# Patient Record
Sex: Female | Born: 1950
Health system: Southern US, Community
[De-identification: ages and names within clinical notes are randomized; demographics above are authoritative.]

## PROBLEM LIST (undated history)

## (undated) DIAGNOSIS — D509 Iron deficiency anemia, unspecified: Secondary | ICD-10-CM

## (undated) DIAGNOSIS — K219 Gastro-esophageal reflux disease without esophagitis: Secondary | ICD-10-CM

## (undated) DIAGNOSIS — M549 Dorsalgia, unspecified: Secondary | ICD-10-CM

## (undated) DIAGNOSIS — J449 Chronic obstructive pulmonary disease, unspecified: Secondary | ICD-10-CM

## (undated) DIAGNOSIS — I2510 Atherosclerotic heart disease of native coronary artery without angina pectoris: Secondary | ICD-10-CM

## (undated) DIAGNOSIS — J309 Allergic rhinitis, unspecified: Secondary | ICD-10-CM

## (undated) DIAGNOSIS — D649 Anemia, unspecified: Secondary | ICD-10-CM

## (undated) HISTORY — DX: Atherosclerotic heart disease of native coronary artery without angina pectoris: I25.10

## (undated) HISTORY — DX: Gastro-esophageal reflux disease without esophagitis: K21.9

## (undated) HISTORY — DX: Dorsalgia, unspecified: M54.9

## (undated) HISTORY — DX: Chronic obstructive pulmonary disease, unspecified: J44.9

## (undated) HISTORY — DX: Iron deficiency anemia, unspecified: D50.9

## (undated) HISTORY — DX: Allergic rhinitis, unspecified: J30.9

## (undated) HISTORY — DX: Anemia, unspecified: D64.9

---

## 2000-02-23 ENCOUNTER — Encounter: Admission: RE | Admit: 2000-02-23 | Discharge: 2000-02-23 | Payer: Self-pay | Admitting: Family Medicine

## 2000-02-23 ENCOUNTER — Encounter: Payer: Self-pay | Admitting: Family Medicine

## 2003-11-21 ENCOUNTER — Inpatient Hospital Stay (HOSPITAL_COMMUNITY): Admission: EM | Admit: 2003-11-21 | Discharge: 2003-11-26 | Payer: Self-pay | Admitting: Emergency Medicine

## 2006-07-19 ENCOUNTER — Encounter: Admission: RE | Admit: 2006-07-19 | Discharge: 2006-07-19 | Payer: Self-pay | Admitting: Otolaryngology

## 2007-11-04 ENCOUNTER — Inpatient Hospital Stay (HOSPITAL_COMMUNITY): Admission: EM | Admit: 2007-11-04 | Discharge: 2007-11-08 | Payer: Self-pay | Admitting: Emergency Medicine

## 2010-05-25 ENCOUNTER — Ambulatory Visit: Payer: Self-pay | Admitting: Diagnostic Radiology

## 2010-05-25 ENCOUNTER — Ambulatory Visit (HOSPITAL_BASED_OUTPATIENT_CLINIC_OR_DEPARTMENT_OTHER): Admission: RE | Admit: 2010-05-25 | Discharge: 2010-05-25 | Payer: Self-pay | Admitting: Family Medicine

## 2010-07-11 ENCOUNTER — Ambulatory Visit: Payer: Self-pay | Admitting: Radiology

## 2010-07-11 ENCOUNTER — Inpatient Hospital Stay (HOSPITAL_COMMUNITY): Admission: EM | Admit: 2010-07-11 | Discharge: 2010-07-14 | Payer: Self-pay | Admitting: Emergency Medicine

## 2010-07-11 ENCOUNTER — Encounter: Payer: Self-pay | Admitting: Emergency Medicine

## 2010-07-25 ENCOUNTER — Encounter: Payer: Self-pay | Admitting: Pulmonary Disease

## 2010-07-25 DIAGNOSIS — K219 Gastro-esophageal reflux disease without esophagitis: Secondary | ICD-10-CM

## 2010-07-25 DIAGNOSIS — M549 Dorsalgia, unspecified: Secondary | ICD-10-CM | POA: Insufficient documentation

## 2010-07-25 DIAGNOSIS — D509 Iron deficiency anemia, unspecified: Secondary | ICD-10-CM | POA: Insufficient documentation

## 2010-07-25 DIAGNOSIS — D649 Anemia, unspecified: Secondary | ICD-10-CM | POA: Insufficient documentation

## 2010-07-25 DIAGNOSIS — J309 Allergic rhinitis, unspecified: Secondary | ICD-10-CM | POA: Insufficient documentation

## 2010-07-25 DIAGNOSIS — J449 Chronic obstructive pulmonary disease, unspecified: Secondary | ICD-10-CM | POA: Insufficient documentation

## 2010-07-26 ENCOUNTER — Ambulatory Visit: Payer: Self-pay | Admitting: Pulmonary Disease

## 2010-07-26 ENCOUNTER — Encounter: Payer: Self-pay | Admitting: Pulmonary Disease

## 2010-07-26 DIAGNOSIS — F17200 Nicotine dependence, unspecified, uncomplicated: Secondary | ICD-10-CM

## 2010-07-26 DIAGNOSIS — J9610 Chronic respiratory failure, unspecified whether with hypoxia or hypercapnia: Secondary | ICD-10-CM | POA: Insufficient documentation

## 2010-09-13 ENCOUNTER — Ambulatory Visit
Admission: RE | Admit: 2010-09-13 | Discharge: 2010-09-13 | Payer: Self-pay | Source: Home / Self Care | Attending: Pulmonary Disease | Admitting: Pulmonary Disease

## 2010-09-20 NOTE — Assessment & Plan Note (Signed)
Summary: COPD-PT TO GO TO [facility] OFFICE//[facility]   Visit Type:  Initial Consult Copy to:  pcp Primary Provider/Referring Provider:  Dr. Marinda Elk  CC:  Pulmonary consult.  History of Present Illness: 59/F smoker for evaluation of COPD. Adm 11/22 - 24/11 for an exacerbation treated wih avelox & steroids, prednisone has caused 'psychosis' in the past. ABG on adm was 7.32/65/62 on O2 & 7.40/55/45 (RA) on discharge suggesting chronic resp failure, was put on O2. She has weaned herself off at rest. Low dose ativan started for anxiety. Spiriva was continued & symbicort given by Dr Foy Guadalajara which helps. She feels close to her baseline. CXrs reviewed, last nov 20, shows chronic changes of COPD & hyperinflation.  O2 satn as 92 % at rest but dropped to 87% after spirometry. This showed severe airway obstruction with FEV1 of 30% c/w Gold stg IV copd. I note repeated conversations with dr fried about smoking cessation. her last quit attempt was 5 yrs ago after  ahospitalisation that lasted x 3 months. Nicotine patches gave her vivid dreams, she does not want to try pills , is wiling to try hyponosis. She has concerns about not getting time off from work for [employer] rehab  Preventive Screening-Counseling & Management  Alcohol-Tobacco     Alcohol drinks/day: 0     Smoking Status: current     Packs/Day: 1.0     Year Started: 1968  Current Medications (verified): 1)  Nexium 40 Mg Cpdr (Esomeprazole Magnesium) .... Take 1 Tablet By Mouth Once A Day 2)  Spiriva Handihaler 18 Mcg Caps (Tiotropium Bromide Monohydrate) .Marland Kitchen.. 1 Capsule Once Daily 3)  Combivent 18-103 Mcg/act Aero (Ipratropium-Albuterol) .... 2 Puffs As Needed 4)  Nabumetone 500 Mg Tabs (Nabumetone) .... Take 1 Tablet By Mouth Two Times A Day 5)  Symbicort 160-4.5 Mcg/act Aero (Budesonide-Formoterol Fumarate) .... Inhale 2 Puffs Two Times A Day  Allergies (verified): 1)  ! Prednisone 2)  ! 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 (1 ppd) Packs/Day:  1.0 Alcohol drinks/day:  0  Review of Systems       The patient complains of shortness of breath with activity, acid heartburn, and indigestion.  The patient denies shortness of breath at rest, productive cough, non-productive cough, coughing up blood, chest pain, irregular heartbeats, loss of appetite, weight change, abdominal pain, difficulty swallowing, sore throat, tooth/dental problems, headaches, nasal congestion/difficulty breathing through nose, sneezing, itching, ear ache, anxiety, depression, hand/feet swelling, joint stiffness or pain, rash, change in color of mucus, and fever.    Vital Signs:  Patient profile:   60 year old female Height:      64 inches Weight:      173 pounds BMI:     29.80 O2 Sat:      92 % on Room air Temp:     98.5 degrees F oral Pulse rate:   82 /  minute BP sitting:   142 / 70  (left arm) Cuff size:   large  Vitals Entered By: Zackery Barefoot CMA (July 26, 2010 3:42 PM)  O2 Flow:  Room air CC: Pulmonary consult Comments Medications reviewed with patient Verified contact number and pharmacy with  patient Zackery Barefoot [facility]  July 26, 2010 3:43 PM    Physical Exam  Additional Exam:  wt 173 July 26, 2010  Gen. Pleasant, well-nourished, in no distress, normal affect ENT - no lesions, no post nasal drip Neck: No JVD, no thyromegaly, no carotid bruits Lungs: no use of accessory muscles, no dullness to percussion, decreased BL without rales or rhonchi  Cardiovascular: Rhythm regular, heart sounds  normal, no murmurs or gallops, no peripheral edema Abdomen: soft and non-tender, no hepatosplenomegaly, BS normal. Musculoskeletal: No deformities, no cyanosis or clubbing Neuro:  alert, non focal     Impression & Recommendations:  Problem # 1:  C O P D (ICD-496) Recent exacerbation ct symbicort, spiriva Pulm rehab, if she will enroll  Problem # 2:  CHRONIC RESPIRATORY FAILURE (ICD-518.83)  ct O2 on exertion & during sleep Compenated hypercarbia  Orders: Consultation Level IV ([address]109) Prescription Created Electronically [id number]) Rehabilitation Referral (Rehab)  Problem # 3:  TOBACCO ABUSE (ICD-305.1)  Long discussion about what to expect if she continues down this path. OK to try hypnosis or electronic cigarette may work better as part of pulm rehab program  Orders: Consultation Level IV [phone]) Prescription Created Electronically [phone])  Medications Added to Medication List This Visit: 1)  Spiriva Handihaler 18 Mcg Caps (Tiotropium bromide monohydrate) .Marland Kitchen.. 1 capsule once daily 2)  Combivent 18-103 Mcg/act Aero (Ipratropium-albuterol) .... 2 puffs as needed 3)  Symbicort 160-4.5 Mcg/act Aero (Budesonide-formoterol fumarate) .... Inhale 2 puffs two times a day  Patient Instructions: 1)  Copy sent to: Dr Foy Guadalajara 2)  Please schedule a follow-up appointment in 1 month. 3)  Use your oxygen when walking & during sleep 4)  breathing test shows severe airwya obstruction/ COPD 5)  Pulm rehab / exercise program 6)  stay on symbicort & spiriva 7)  We discussed smoking  cessation Prescriptions: COMBIVENT 18-103 MCG/ACT AERO (IPRATROPIUM-ALBUTEROL) 2 puffs as needed  #1 x 3   Entered and Authorized by:   Comer Locket Vassie Loll MD   Signed by:   Comer Locket Vassie Loll MD on 07/26/2010   Method used:   [facility]ically to        [id number]       [address]       [place], [place]  [address]       Ph: [phone] or [phone]       Fax: [phone]   RxID:   [id number] SPIRIVA HANDIHALER 18 MCG CAPS (TIOTROPIUM BROMIDE MONOHYDRATE) 1 capsule once daily  #1 x 5   Entered and Authorized by:   Comer Locket Vassie Loll MD   Signed by:   Comer Locket Vassie Loll MD on 07/26/2010   Method used:   Electronically to        [id number]       [address]       [place], [place]  [address]       Ph: [phone] or [phone]       Fax: [phone]   RxID:   [id number] SYMBICORT 160-4.5 MCG/ACT AERO (BUDESONIDE-FORMOTEROL FUMARATE)  Inhale 2 puffs two times a day  #1 x 5   Entered and Authorized by:   Comer Locket. Vassie Loll MD   Signed by:   Comer Locket Vassie Loll MD on 07/26/2010   Method used:   [facility]        [id number]       [address]       [place], [place]  [address]       Ph: [phone] or [phone]       Fax: [phone]   RxID:   [id number]    Immunization History:  Influenza Immunization History:    Influenza:  historical (06/21/2010)  Pneumovax Immunization History:    Pneumovax:  historical (07/04/2010)

## 2010-09-22 NOTE — Assessment & Plan Note (Signed)
Summary: 1 month follow up in [facility]//jwr   Visit Type:  Follow-up Copy to:  pcp Primary Provider/Referring Provider:  Dr. Marinda Elk  CC:  Pt states breathing is Samantha same. No new complaints.  History of Present Illness: 60/F smoker for FU of gold stg 4 COPD. Adm 11/22 - 24/11 for an exacerbation treated wih avelox & steroids, prednisone has caused 'psychosis' in Samantha past. ABG on adm was 7.32/65/62 on O2 & 7.40/55/45 (RA) on discharge suggesting chronic resp failure, was put on O2. She has weaned herself off at rest. Low dose ativan started for anxiety. Spiriva was continued & symbicort given by Dr Foy Guadalajara which helps. She feels close to her baseline. CXrs reviewed, last nov 20, shows chronic changes of COPD & hyperinflation.  O2 satn as 92 % at rest but dropped to 87% after spirometry. This showed severe airway obstruction with FEV1 of 30% c/w Gold stg IV copd. I note repeated conversations with dr fried about smoking cessation. her last quit attempt was 5 yrs ago after  ahospitalisation that lasted x 3 months. Nicotine patches gave her vivid dreams, she does not want to try pills , is wiling to try hypnosis. She has concerns about not getting time off from work for [employer] rehab  September 13, 2010 2:48 PM  Compliant with spiriva, uses O2 during sleep, not much during daytime - not sure if this is helping. Continues to smoke a PPD  Preventive Screening-Counseling & Management  Alcohol-Tobacco     Alcohol drinks/day: 0     Smoking Status: current     Packs/Day: 1.0     Year Started: 1968  Current Medications (verified): 1)  Nexium 40 Mg Cpdr (Esomeprazole Magnesium) .... Take 1 Tablet By Mouth Once A Day 2)  Nabumetone 500 Mg Tabs (Nabumetone) .... Take 1 Tablet By Mouth Two Times A Day 3)  Symbicort 160-4.5 Mcg/act Aero (Budesonide-Formoterol Fumarate) .... Inhale 2 Puffs Two Times A Day 4)  Spiriva Handihaler 18 Mcg Caps (Tiotropium Bromide Monohydrate) .... Once Daily 5)  Combivent 18-103  Mcg/act Aero (Ipratropium-Albuterol) .... As Needed  Allergies (verified): 1)  ! Prednisone 2)  ! 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 (1 ppd)  Review of Systems       Samantha Dyer complains of dyspnea on exertion.  Samantha Dyer denies anorexia, fever, weight loss, weight gain, vision loss, decreased hearing, hoarseness, chest pain, syncope, peripheral edema, prolonged cough, headaches, hemoptysis, abdominal pain, melena, hematochezia, severe indigestion/heartburn, hematuria, muscle weakness, suspicious skin lesions, transient blindness, difficulty walking, depression, unusual weight change, abnormal bleeding, enlarged lymph nodes, and angioedema.    Vital Signs:  Dyer profile:   60 year old female Height:      64 inches Weight:      169 pounds BMI:     29.11 O2 Sat:      91 % on Room air Temp:     98.6 degrees F oral Pulse rate:   90 / minute BP sitting:   142 / 70  (right arm) Cuff size:   regular  Vitals Entered By: Zackery Barefoot CMA (September 13, 2010 2:39 PM)  O2 Flow:  Room air CC: Pt states breathing is  Samantha same. No new complaints Comments Medications reviewed with Dyer Verified contact number and pharmacy with Dyer Zackery Barefoot [facility]  September 13, 2010 2:43 PM    Physical Exam  Additional Exam:  wt 173 July 26, 2010 169 September 14, 2010 Gen. Pleasant, well-nourished, in no distress, normal affect ENT - no lesions, no post nasal drip Neck: No JVD, no thyromegaly, no carotid bruits Lungs: no use of accessory muscles, no dullness to percussion,  decreased BL without rales or rhonchi  Cardiovascular: Rhythm regular, heart sounds  normal, no murmurs or gallops, no peripheral edema Musculoskeletal: No deformities, no cyanosis or clubbing      Impression & Recommendations:  Problem # 1:  C O P D (ICD-496) ct symbicort , spiriva discusse benefits of O2 Does not want to enroll in pulm rehab  Problem # 2:  TOBACCO ABUSE (ICD-305.1)  Discussed options & methods of quitting. She is willing to try chantix- discussed adverse effects Her updated medication list for this problem includes:    Chantix Starting Month Pak 0.5 Mg X 11 & 1 Mg X 42 Tabs (Varenicline tartrate) .Marland Kitchen... Take as directed    Chantix Continuing Month Pak 1 Mg Tabs (Varenicline tartrate) .Marland Kitchen... Take as directed  Orders: Est. Dyer Level III (16109) Prescription Created Electronically [phone])  Medications Added to Medication List This Visit: 1)  Symbicort 160-4.5 Mcg/act Aero (Budesonide-formoterol fumarate) .... Inhale 2 puffs two times a day 2)  Spiriva Handihaler 18 Mcg Caps (Tiotropium bromide monohydrate) .... Once daily 3)  Combivent 18-103 Mcg/act Aero (Ipratropium-albuterol) .... As needed 4)  Chantix Starting Month Pak 0.5 Mg X 11 & 1 Mg X 42 Tabs (Varenicline tartrate) .... Take as directed 5)  Chantix Continuing Month Pak 1 Mg Tabs (Varenicline tartrate) .... Take as directed  Dyer Instructions: 1)  Copy sent to: Dr Foy Guadalajara 2)  Please schedule a follow-up appointment in 4 months with TP 3)  Lets focus on quitting cigarettes - we talked about not smoking inside Samantha house 4)  Trial of chantix Prescriptions: CHANTIX CONTINUING MONTH PAK 1 MG TABS (VARENICLINE TARTRATE) take as directed  #1 x 2   Entered and Authorized by:   Comer Locket Vassie Loll MD   Signed by:   Comer Locket Vassie Loll MD on 09/13/2010   Method used:   [facility]        [id number]       [address]       [place], [place]  [address]       Ph: [phone] or  [phone]       Fax: [phone]   RxID:   [id number] CHANTIX STARTING MONTH PAK 0.5 MG X 11 & 1 MG X 42 TABS (VARENICLINE TARTRATE) take as directed  #10 x 0   Entered and Authorized by:   Comer Locket. Vassie Loll MD   Signed by:   Comer Locket Vassie Loll MD on 09/13/2010   Method used:   [facility] to        [id number]       [address]       [place], [place]  [address]       Ph: [phone] or [phone]       Fax: [phone]   RxID:   [id number]

## 2010-10-06 ENCOUNTER — Telehealth: Payer: Self-pay | Admitting: Pulmonary Disease

## 2010-10-12 NOTE — Progress Notes (Signed)
  Phone Note Call from Patient   Caller: [email or web address]  Call For: Starlene Consuegra Summary of Call: DAVE SAID PT WANTS HER 02 PICKED UP BUT THET WERE TOLD SHE IS TO USE IT HS AND SHE SAID SHE IS NOT USING IT ANYMORE WILL NEED ORDER TO DC Initial call taken by: Oneita Jolly,  October 06, 2010 12:47 PM  Follow-up for Phone Call        ok to dc. she is till smoking Follow-up by: Comer Locket. Vassie Loll MD,  October 06, 2010 1:42 PM

## 2010-11-01 LAB — BLOOD GAS, ARTERIAL
Acid-Base Excess: 6.5 mmol/L — ABNORMAL HIGH (ref 0.0–2.0)
Acid-Base Excess: 7.4 mmol/L — ABNORMAL HIGH (ref 0.0–2.0)
Bicarbonate: 33.6 mEq/L — ABNORMAL HIGH (ref 20.0–24.0)
Bicarbonate: 33.8 mEq/L — ABNORMAL HIGH (ref 20.0–24.0)
Delivery systems: POSITIVE
Drawn by: 24485
Drawn by: 29925
FIO2: 35 %
Inspiratory PAP: 14
O2 Saturation: 88.8 %
O2 Saturation: 94.9 %
Patient temperature: 98.6
Patient temperature: 98.6
TCO2: 35.2 mmol/L (ref 0–100)
TCO2: 35.5 mmol/L (ref 0–100)
TCO2: 35.8 mmol/L (ref 0–100)
pCO2 arterial: 55.5 mmHg — ABNORMAL HIGH (ref 35.0–45.0)
pCO2 arterial: 65.3 mmHg (ref 35.0–45.0)
pH, Arterial: 7.321 — ABNORMAL LOW (ref 7.350–7.400)
pH, Arterial: 7.402 — ABNORMAL HIGH (ref 7.350–7.400)
pO2, Arterial: 45.4 mmHg — ABNORMAL LOW (ref 80.0–100.0)
pO2, Arterial: 59.2 mmHg — ABNORMAL LOW (ref 80.0–100.0)
pO2, Arterial: 62.4 mmHg — ABNORMAL LOW (ref 80.0–100.0)

## 2010-11-01 LAB — POCT I-STAT 3, ART BLOOD GAS (G3+)
Patient temperature: 98.7
pCO2 arterial: 69.8 mmHg (ref 35.0–45.0)
pH, Arterial: 7.304 — ABNORMAL LOW (ref 7.350–7.400)

## 2010-11-01 LAB — BASIC METABOLIC PANEL
BUN: 1 mg/dL — ABNORMAL LOW (ref 6–23)
BUN: 4 mg/dL — ABNORMAL LOW (ref 6–23)
CO2: 30 mEq/L (ref 19–32)
Calcium: 8.8 mg/dL (ref 8.4–10.5)
Chloride: 96 mEq/L (ref 96–112)
Creatinine, Ser: 0.6 mg/dL (ref 0.4–1.2)
GFR calc Af Amer: >60 mL/min (ref >60)
GFR calc non Af Amer: >60 mL/min (ref >60)
Glucose, Bld: 103 mg/dL — ABNORMAL HIGH (ref 70–99)
Potassium: 4 mEq/L (ref 3.5–5.1)
Sodium: 135 mEq/L (ref 135–145)

## 2010-11-01 LAB — CBC
HCT: 43.3 % (ref 36.0–46.0)
Hemoglobin: 14.4 g/dL (ref 12.0–15.0)
MCH: 31.1 pg (ref 26.0–34.0)
MCHC: 33.3 g/dL (ref 30.0–36.0)
MCHC: 34.7 g/dL (ref 30.0–36.0)
MCV: 89.6 fL (ref 78.0–100.0)
MCV: 93.1 fL (ref 78.0–100.0)
Platelets: 169 10*3/uL (ref 150–400)
RBC: 4.89 MIL/uL (ref 3.87–5.11)
RDW: 14.2 % (ref 11.5–15.5)
RDW: 15.1 % (ref 11.5–15.5)

## 2010-11-01 LAB — MRSA PCR SCREENING: MRSA by PCR: POSITIVE — AB

## 2010-11-01 LAB — DIFFERENTIAL
Basophils Absolute: 0.2 10*3/uL — ABNORMAL HIGH (ref 0.0–0.1)
Basophils Relative: 2 % — ABNORMAL HIGH (ref 0–1)
Eosinophils Absolute: 0.1 10*3/uL (ref 0.0–0.7)
Eosinophils Relative: 1 % (ref 0–5)
Lymphs Abs: 0.9 10*3/uL (ref 0.7–4.0)
Neutrophils Relative %: 80 % — ABNORMAL HIGH (ref 43–77)

## 2010-11-01 LAB — POCT CARDIAC MARKERS: Myoglobin, poc: 94.7 ng/mL (ref 12–200)

## 2010-12-23 ENCOUNTER — Encounter: Payer: Self-pay | Admitting: Adult Health

## 2010-12-27 ENCOUNTER — Ambulatory Visit (INDEPENDENT_AMBULATORY_CARE_PROVIDER_SITE_OTHER): Payer: BC Managed Care – PPO | Admitting: Adult Health

## 2010-12-27 ENCOUNTER — Encounter: Payer: Self-pay | Admitting: Adult Health

## 2010-12-27 VITALS — BP 122/76 | HR 92 | Temp 98.7°F | Ht 64.0 in | Wt 171.5 lb

## 2010-12-27 DIAGNOSIS — J449 Chronic obstructive pulmonary disease, unspecified: Secondary | ICD-10-CM

## 2010-12-27 DIAGNOSIS — J9610 Chronic respiratory failure, unspecified whether with hypoxia or hypercapnia: Secondary | ICD-10-CM

## 2010-12-27 DIAGNOSIS — J4489 Other specified chronic obstructive pulmonary disease: Secondary | ICD-10-CM

## 2010-12-27 NOTE — Patient Instructions (Signed)
Restart Oxygen 2 l/m At bedtime  And with activiity/walking .  Continue on Spiriva and Symbicort  MOST IMPORTANT IS TO QUIT SMOKING. THIS IS THE BEST THING YOU CAN DO FOR YOURSELF follow up Dr. [name]  In 4 months and As needed    

## 2010-12-27 NOTE — Assessment & Plan Note (Signed)
Restart Oxygen 2 l/m At bedtime  And with activiity/walking .  Continue on Spiriva and Symbicort  MOST IMPORTANT IS TO QUIT SMOKING. THIS IS THE BEST THING YOU CAN DO FOR YOURSELF follow up Dr. [name]  In 4 months and As needed    

## 2010-12-27 NOTE — Assessment & Plan Note (Addendum)
Restart Oxygen 2 l/m At bedtime  And with activiity/walking .  Continue on Spiriva and Symbicort  MOST IMPORTANT IS TO QUIT SMOKING. THIS IS THE BEST THING YOU CAN DO FOR YOURSELF follow up Dr. Vassie Loll  In 4 months and As needed

## 2010-12-27 NOTE — Progress Notes (Signed)
Subjective:    Patient ID: Samantha Dyer, female    DOB: 1950/10/04, 60 y.o.   MRN: [id number]  HPI 60/F smoker for FU of gold stg 4 COPD.   Adm 11/22 - 24/11 for an exacerbation treated wih avelox & steroids, prednisone has caused 'psychosis' in the past. ABG on adm was 7.32/65/62 on O2 & 7.40/55/45 (RA) on discharge suggesting chronic resp failure, was put on O2. She has weaned herself off at rest. Low dose ativan started for anxiety. Spiriva was continued & symbicort given by Dr Foy Guadalajara which helps. She feels close to her baseline. CXrs reviewed, last nov 20, shows chronic changes of COPD & hyperinflation.  O2 satn as 92 % at rest but dropped to 87% after spirometry. This showed severe airway obstruction with FEV1 of 30% c/w Gold stg IV copd.  I note repeated conversations with dr fried about smoking cessation. her last quit attempt was 5 yrs ago after ahospitalisation that lasted x 3 months. Nicotine patches gave her vivid dreams, she does not want to try pills , is wiling to try hypnosis. She has concerns about not getting time off from work for [employer] rehab   September 13, 2010 2:48 PM  Compliant with spiriva, uses O2 during sleep, not much during daytime - not sure if this is helping.  Continues to smoke a PPD  12/27/10 Follow up  Pt returns for 4 month follow up. Since last visit, says she is doing okay. Still works fulltime, does Engineer, agricultural work. Is fairly sedentary-light housework, minimal shopping due to DOE. She did return her O2 back to DME. Felt she  Did not need. We discussed her desaturations. On arrival today she was 86% on RA with walking from lobby to exam room. Explained the benefit of o2 and dangers of hypoxia. She has agreed to restart O2 qhs and with actiivity. She would like portable lightweight device to use with shopping and walking.   She uses her inhalers consistently with rare use of combivent.  No ER visits or steroid use since last ov.   We discussed smoking  cesstation in detail . With several options of electronic cigs, patches, etc. She is not ready yet "   Review of Systems Constitutional:   No  weight loss, night sweats,  Fevers, chills, fatigue, or  lassitude.  HEENT:   No headaches,  Difficulty swallowing,  Tooth/dental problems, or  Sore throat,                No sneezing, itching, ear ache, nasal congestion, post nasal drip,   CV:  No chest pain,  Orthopnea, PND, swelling in lower extremities, anasarca, dizziness, palpitations, syncope.   GI  No heartburn, indigestion, abdominal pain, nausea, vomiting, diarrhea, change in bowel habits, loss of appetite, bloody stools.   Resp:   No coughing up of blood.  No change in color of mucus.  No wheezing.  No chest wall deformity  Skin: no rash or lesions.  GU: no dysuria, change in color of urine, no urgency or frequency.  No flank pain, no hematuria   MS:  No joint pain or swelling.  No decreased range of motion.  No back pain.  Psych:  No change in mood or affect. No depression or anxiety.  No memory loss.         Objective:   Physical Exam GEN: A/Ox3; pleasant , NAD, well nourished , overweight   HEENT:  Cliff/AT,  EACs-clear, TMs-wnl, NOSE-clear, THROAT-clear, no lesions,  no postnasal drip or exudate noted.   NECK:  Supple w/ fair ROM; no JVD; normal carotid impulses w/o bruits; no thyromegaly or nodules palpated; no lymphadenopathy.  RESP  Diminshed in bases , no accessory muscle use, no dullness to percussion  CARD:  RRR, no m/r/g  , no peripheral edema, pulses intact, no cyanosis or clubbing.  GI:   Soft & nt; nml bowel sounds; no organomegaly or masses detected.  Musco: Warm bil, no deformities or joint swelling noted.   Neuro: alert, no focal deficits noted.    Skin: Warm, no lesions or rashes         Assessment & Plan:

## 2011-01-03 NOTE — Discharge Summary (Signed)
Samantha Dyer, Samantha Dyer               ACCOUNT NO.:  [id number]   MEDICAL RECORD NO.:  [id number]          PATIENT TYPE:  INP   LOCATION:  1436                         FACILITY:  [facility]   PHYSICIAN:  Hollice Espy, M.D.DATE OF BIRTH:  August 29, 1950   DATE OF ADMISSION:  11/04/2007  DATE OF DISCHARGE:  11/08/2007                               DISCHARGE SUMMARY   PRIMARY CARE Aslan Montagna:  Dr. Dewaine Oats.   DISCHARGE DIAGNOSES:  1. Chronic obstructive pulmonary disease exacerbation.  2. Secondary bronchitis.  3. Tobacco abuse, ongoing.  4. Hypoxia requiring oxygen at the time of discharge.  5. Hypotension, now resolved.   DISCHARGE MEDICINES:  1. The patient resumed her previous medications of Meclizine 10 p.o.      daily p.r.n.  2. Nexium 40 p.o. p.r.n.   NEW MEDICATIONS:  1. Avelox 400 mg p.o. daily x4 days.  2. Prednisone taper 50 mg p.o. b.i.d. on day 1 40 mg p.o. b.i.d. on      day 2 and to continue until finished.  3. Combivent inhaler 2 puffs 3 times a day schedule x1 week then      p.r.n.  4. Home oxygen at 2L by nasal cannula continuous until she follows up      with her PCP next week.  5. The patient given a prescription for both Chantix and nicotine      patches with the Chantix at 0.5 mg p.o. daily x3 days then p.o.      b.i.d. x4 days then 1 mg p.o. b.i.d.  The patient is to resume 1      starter pack and 2 refill packs if her insurance will cover this.      If not, then she is being discharged on nicotine patches 21 mg      topically daily.   HOSPITAL COURSE:  The patient is a 60 year old white female with a past  medical history of ongoing tobacco abuse who presented with shortness of  breath, cough, and weakness.  When she presented, she was found to have  an elevated PCO2 level and required BiPAP, so she was placed in stepdown  unit.  She started on IV steroids, IV Avelox, and supplemental oxygen.  She did well.  The next day, the BiPAP was able to be weaned off.   X-  rays showed no signs of infiltrate, but with elevated white count and  productive cough, suspected she had bronchitis.  Given her long,  extensive tobacco history and presentation of lungs on x-ray, they felt  the patient had mild underlying COPD.  Over the next several days, the  patient's steroids were changed over to p.o. and she was able to be  weaned down.  She was continued on Avelox.  Her oxygenation improved and  she was able to remain on 90% on the day of discharge on room air.  However, with ambulation, her oxygen saturation dropped to 85%.  Given  the fact that she was breathing more comfortably and appeared to be  doing well, and the fact that her husband's brother had passed away on  the 19th and she needed to get home to attend to funeral arrangements,  we have set up home oxygen for the patient.  She will continue on home  oxygen for the next several days and then, once she follows up with her  PCP next week, if her oxygenation is improved, her home oxygen can be  discontinued.  At this time, she will continue it on a tapering  prednisone and a few more days of Avelox for a 7-day treatment.  These  will likely be finished by the time she sees her PCP.  She has expressed  an interest in quitting smoking.  I have given her  prescriptions for both nicotine patch and Chantix to be used, which ever  one can be covered by her insurance.  The patient's overall disposition  is improved.  Activity will be slow to increase.  Continuous home  oxygen.  Diet will be a regular diet, and she is being discharged to  home.  Follow up with PCP, Dr. Foy Guadalajara, next week.      Hollice Espy, M.D.  Electronically Signed     SKK/MEDQ  D:  11/08/2007  T:  11/08/2007  Job:  [id number]   cc:   Molly Maduro L. Foy Guadalajara, M.D.  Fax: [phone]

## 2011-01-03 NOTE — H&P (Signed)
Samantha Dyer, Samantha Dyer               ACCOUNT NO.:  [id number]   MEDICAL RECORD NO.:  [id number]          PATIENT TYPE:  INP   LOCATION:  0102                         FACILITY:  [facility]   PHYSICIAN:  Corinna L. Lendell Caprice, MDDATE OF BIRTH:  1951/05/23   DATE OF ADMISSION:  11/04/2007  DATE OF DISCHARGE:                              HISTORY & PHYSICAL   CHIEF COMPLAINT:  Cough.   HISTORY OF PRESENT ILLNESS:  Samantha Dyer is a 60 year old white female  patient of Dr. Nicholos Johns who was sent to the emergency, room when she was  found to be hypoxic in his office.  Apparently on arrival, her oxygen  saturations were in the 50s and on 3 liters of nasal cannula oxygen  increased 88%.  She was started on a Z-Pak on 11th of this month for  bronchitis, but she has been getting worse.  She has been sleepy,  vomiting, weak.  She has a cough productive of green sputum.  She is  sleepy and really only answers yes or no to questions.   PAST MEDICAL HISTORY:  COPD per records, continued tobacco abuse.   MEDICATIONS:  Nexium, meclizine as needed.   ALLERGIES  She reports a psychotic reaction to prednisone.  She has received Solu-  Medrol here, without any reaction in the emergency room.   SOCIAL HISTORY:  The patient is a Diplomatic Services operational officer.  She smokes a pack of  cigarettes a day.  She denies drinking or drug use.   FAMILY HISTORY:  Her mother died of breast cancer.  Her father died of  pancreatic cancer.   REVIEW OF SYSTEMS:  Is difficult, as the patient is somewhat sleepy but  as above otherwise negative.   PHYSICAL EXAMINATION:  VITAL SIGNS:  Temperature is 97 degrees, blood  pressure wonder 103/71, pulse 103, respiratory rate 24, oxygen  saturation 89% to 90% on 6 liters nasal cannula oxygen.  GENERAL:  The patient is a sleepy white female who is arousable.  HEENT:  Normocephalic, atraumatic.  Pupils equal, round, reactive to  light.  Sclerae nonicteric.  Moist mucous membranes.  She has a whitish  exudate  on the right tonsil.  There is no erythema or swelling.  NECK:  Supple.  No lymphadenopathy.  LUNGS:  She has diffuse rhonchi and diminished breath sounds throughout.  CARDIOVASCULAR:  Regular rate and rhythm without murmurs, gallops or  rubs.  ABDOMEN:  Obese, soft, nontender, nondistended.  GU AND RECTAL:  Deferred.  EXTREMITIES:  No clubbing, cyanosis or edema.  SKIN:  No rash.  PSYCHIATRIC.  The patient is cooperative.  NEUROLOGIC:  She is sleepy but oriented.  Cranial nerves and sensory  motor exam are intact.   Chest x-ray:  Preliminary reading shows possible pneumonia.   LABORATORY:  White blood cell count is 22,000 with 93% neutrophils, 5%  lymphocytes.  The rest of her CBC is unremarkable.  ABG on 100% non-  rebreather showed a pH of 7.325, pCO2 of 61.2, pO2 of 94, bicarbonate  31, base excess 3.5, oxygen saturation 96.7%.  Sodium 130, potassium  4.0, chloride 88, bicarbonate 34, glucose  130, BUN 20, creatinine 0.55.  Flu swab negative.   ASSESSMENT/PLAN:  1. Pneumonia with chronic obstructive pulmonary disease exacerbation.      The patient will be placed in the step-down unit, due to her      hypoxia and hypercarbia.  I will get another ABG in the morning.      She will get Avelox, nebulizers, and Solu-Medrol.  She will need      tobacco cessation counseling.  I will also get a urine Legionella      antigen.  She does have a single whitish exudate on the right      adenoid, and I will order a rapid strep swab, although she denies      sore throat.  She will get ulcer and DVT prophylaxis.  I will      monitor CBG as well on Solu-Medrol, monitor closely for altered      mental status on steroids.  2. Chronic obstructive pulmonary disease exacerbation.  3. Continued tobacco abuse.  4. Hyponatremia.      Corinna L. Lendell Caprice, MD  Electronically Signed     CLS/MEDQ  D:  11/04/2007  T:  11/04/2007  Job:  [id number]   cc:   Molly Maduro L. Foy Guadalajara, M.D.  Fax: [phone]

## 2011-01-06 NOTE — Discharge Summary (Signed)
Samantha Dyer, Samantha Dyer                         ACCOUNT NO.:  [id number]   MEDICAL RECORD NO.:  [id number]                   PATIENT TYPE:  INP   LOCATION:  9811                                 FACILITY:  [facility]   PHYSICIAN:  Hollice Espy, M.D.            DATE OF BIRTH:  1951/05/16   DATE OF ADMISSION:  11/21/2003  DATE OF DISCHARGE:                                 DISCHARGE SUMMARY   DATE OF BIRTH:  Jun 05, 1951.   PRIMARY CARE PHYSICIAN:  Robert L. Foy Guadalajara, M.D.   DISCHARGE DIAGNOSES:  1. Chronic obstructive pulmonary disease exacerbation.  2. Bronchopneumonia.  3. Tobacco abuse.   DISCHARGE MEDICATIONS:  1. Prednisone taper starting at 60 mg p.o. b.i.d. times two days, then     decreasing by 10 mg every two days until finished.  2. Albuterol inhaler two puffs four times a day times three days and then     p.r.n.  3. Avelox 400 mg p.o. daily.   HISTORY OF PRESENT ILLNESS:  This is a 60 year old white female with a  history of tobacco abuse who presented with cold like symptoms including a  hacking cough, sinus drainage and increasing shortness of breath three days  prior to coming in.  She had been tried empirically on Keflex and Tessalon  Perles, but this had not seemed to be getting much better.  When she came to  the ER, she was found to have an 02 saturation of 70% on room air.  An  initial chest x-ray was negative, but with hydration a follow-up chest x-ray  showed early signs of streaky consistent with bronchopneumonia.  After being  on six liters of 02, her 02 saturations went up to about 94%.  The patient  was noted to have an active smoking history for 35 pack years.  She was  started on IV steroids, antibiotics and nebulizers plus 02 and transferred.  In addition, she was also noted to have an ABG done which showed an elevated  pC02 level of 64.  The patient was started on BiPAP.  Given her BiPAP  control, she was placed initially in the step-down unit.   HOSPITAL COURSE:  1. In regard to the patient's COPD and pneumonia, the patient improved     nicely on BiPAP as well as the other supplemental treatments.  She felt     less short of breath.  We decided to stop the BiPAP and initially her     pC02 came down slightly from 64 down to the 50's, but then went back up     after being off of BiPAP.  Given the patient's continued C02 retention,     pulmonary critical care was consulted who felt that the patient had     hypoxic, hypocarbic respiratory failure with again a question of     community acquired pneumonia in the setting of tobacco abuse.  They  agreed with continuing the antibiotics and steroids and CPAP with the     lower rate increased to 8.0. She did not have to be ventilated and she     did actually improve greatly.  The patient was responding to antibiotics     and treatment without further increase in her pC02 level and by November 25, 2003 she was able to be transferred up to the floor. At that time her     white count had decreased down to 12.2 and her chest x-ray shows signs of     resolving pneumonia.  She was breathing comfortably and her respiratory     rate was normal.  By November 25, 2003 the patient was able to maintain an     elevated 02 saturation of 94% on room air when at rest and her IV     antibiotics were changed over to p.o. as well as her IV steroids.  By the     day of discharge, November 26, 2003, the patient was able to ambulate around     her room easily without oxygen and maintaining an 02 saturation of     greater than 94%.  The rest of her vital signs, blood pressure and     otherwise remained stable.  2. In addition, the patient was noted to have a low iron level at 14.0 and a     TIBC at 366.  A ferritin level was at the low end of normal at 20.  The     reason these laboratories were checked was that the patient was noted to     have an MCV of only 77.0.  This likely may represent, given the normal      ferritin level, of chronic disease but we will let Dr. Foy Guadalajara follow this     up.  3. The patient was counseled on tobacco abuse on the day of discharge.  She     says that she has just started a patch and is considering Wellbutrin.  I     informed her that both of these medications will provide temporary     relief, but will not aid in tobacco abuse unless the patient is seriously     interested in quitting smoking and hopefully that this will be a good     warning sign for that.  The patient is advised of this and that she is     willing to think about it.  She will continue to receive four days of     antibiotics for her remaining pneumonia.  She will continue to be on a     rapid steroid taper starting at 60 mg p.o. daily times two days and then     decreasing by 10 mg every two days until finished.  She will continue to     receive an Albuterol inhaler which she has had in the past, but she has     not used it in a long time. She will take this over the next three days,     two puffs four times a day and then as needed.   ACTIVITY:  She was advised not to return to work until seen by Dr. Foy Guadalajara  next week and then she may be cleared for activity after that; otherwise, no  exertional activity.   DISPOSITION:  Improved.  She is being discharged to home.   DIET:  She is discharged on a  regular diet.                                               Hollice Espy, M.D.    SKK/MEDQ  D:  11/26/2003  T:  11/27/2003  Job:  [id number]   cc:   Molly Maduro L. Foy Guadalajara, M.D.  [address] [place]  [place] [address]  Fax: [phone]

## 2011-01-06 NOTE — H&P (Signed)
NAME:  Samantha Dyer, Samantha Dyer                         ACCOUNT NO.:  [id number]   MEDICAL RECORD NO.:  [id number]                   PATIENT TYPE:  EMS   LOCATION:  ED                                   FACILITY:  [facility]   PHYSICIAN:  Isla Pence, M.D.             DATE OF BIRTH:  [birth year]   DATE OF ADMISSION:  11/21/2003  DATE OF DISCHARGE:                                HISTORY & PHYSICAL   ID STATEMENT:  This is a 60 year old Caucasian lady with no significant past  medical history except that she has been told she might have emphysema whose  primary care physician is Dr. Foy Guadalajara.   CHIEF COMPLAINT:  Worsening cold   HISTORY OF PRESENT ILLNESS:  This 60 year old lady says that she came down  with cold like symptoms starting on Wednesday which she describes as sinus  drainage and a hacking cough. She denies any fever, denies any sore throat  but notes a headache with it. She had been taking Motrin cold and sinus but  had not taken any Robitussin. She was seen by a local doctor who is close to  her since Dr. [facility] did not have any openings for her.  Dr. Quintella Reichert  had seen her the very next day on Thursday, given her Keflex 500 mg p.o.  t.i.d. that she had taken up until, I believe, this morning and also  Tessalon Perles. He had given her 200 mg p.o. t.i.d. of the [employer].  In addition, he sent her home with Nasonex nasal spray and maybe an Atrovent  nasal spray, she was not very clear on the other name. She felt that the  medicine was drying her up too much and she noted that she could not breath  through her nose. She tried some inhalers from a previous infectious process  last year. She tried two puffs of the albuterol inhaler and then repeated it  again two hours later without much significant improvement. She continued to  complain of shortness of breath and a hacking cough. She was seen at the  [facility] today and found her O2 saturations were low and I  was  told by the ER physician 70% sats on room air.  In addition, the patient  tells me that she had some blood work there and had a chest x-ray done prior  to being sent over to the [facility] Emergency Room.  In the emergency  room, her O2 saturations were noted to be 54% on room air and she was thus  placed on 6 liters of O2 which brought her saturations up to 94%.  The ER  physician based on that information felt the patient needed to be admitted  and subsequently called the [facility] doctor to admit her prior to any  of the labs being available.  Aside from the above history, the patient  notes that last year  Dr. Foy Guadalajara had advised her to go through some additional  pulmonary workup; however, the patient says that she has not gotten around  to doing this.  Dr. Foy Guadalajara had told her that she might have some signs of  emphysema starting up on her.  Of significance also is the fact that she has  continued to smoke a pack a day since the age of 57.  She denies any other  symptoms. She does note some allergies over the past few years that have  been acting up on her.  In the emergency room, she was given Solu-Medrol 125  mg IV x1 started on normal saline bolus about 500 mL and given albuterol,  Atrovent nebulizer treatment x1.  She was also given azithromycin.   ALLERGIES:  No known drug allergies.   CURRENT MEDICATIONS:  Just as mentioned above.  1. Keflex p.o. t.i.d. since the illness.  2. Tessalon Perles 200 mg p.o. t.i.d.  3. Nasonex nasal spray and one other nasal spray.   PAST MEDICAL HISTORY:  Aside from the mention of possibility of emphysema,  she denies any other medical problems, denies hypertension, diabetes  mellitus, hyperlipidemia. She says the last time she had a cholesterol check  was about 3 or 4 years ago and was told it was fine.   PAST SURGICAL HISTORY:  Entirely negative.   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  Denies drinking but  tobacco use as mentioned earlier a pack per day since the age of 32. She has  not had any cigarettes today which is a good thing. Denies any IV drug use.   FAMILY HISTORY:  Mother died of breast cancer at the age of 38 which is when  she was diagnosed. Father had pancreatic cancer and died at the age of 59.  There is no colon or breast cancer.  MI in the brother who died at the age  of 17, father at the age of 71.  Both sets of grandfather's in their 60's of  70's.  Diabetes mellitus in the maternal grandmother, stroke in the maternal  grandmother.  She is unsure of cholesterol problems.   REVIEW OF SYMPTOMS:  As per HPI. She otherwise denies __________ pain. She  denies heartburn, melena, hematochezia, change in bowel habits.  Also denies  urinary symptoms.   PHYSICAL EXAMINATION:  GENERAL:  She is currently feeling better.  VITAL SIGNS:  Her latest vitals shows a blood pressure of 129/58, pulse of  103, respiratory rate 22, it was 24 earlier on presentation. Her pulse ox as  mentioned earlier is 94% on 6 liters. She has been brought down to 4 liters  and is maintaining her saturations.  Her initial blood pressure on  presentation was 123/73 and her temperature initially was 99. In general,  she does not appear to be in respiratory distress but certainly looks like  as though she has got significant nasal congestion and blockage from just  the way she is talking with the nasal tone. She does ot appear to be  dyspneic at this point in time.  HEENT:  Her TM's are normal. Nose shows edematous congested nasal mucosa.  There is no purulent drainage, no polyps noted. Oropharynx without any  erythema. Shoddy anterior cervical lymph node but otherwise not very  significant.  LUNGS:  Clearly on auscultation there is no wheezing at  this point in time  since she has already received the treatments but she certainly has  crackle sounds almost halfway up into her lung fields.  HEART:  Regular rate and rhythm.  ABDOMEN:  Bowel sounds are normal, soft, nontender.  EXTREMITIES:  Lower extremities there is no edema.  NEUROLOGIC:  Grossly normal.   LABORATORY DATA:  On laboratory workup, her blood done on 6 liters came back  with a pH of 7.32, pCO2 of 64, PO2 of 117, bicarb of 32.  On her chemistry  panel, her sodium was 136, potassium 4, chloride and CO2 132, BUN and  creatinine 4 and 0.7, glucose 145, LFT's were normal except for albumin of  3.4 which is just on the lower limits of normal, normal being 3.5 and above.  Calcium is 8.3.  BNP was 87, D-dimer was less than 0.22.  PT and INR were  13.1 and 1.   Chest x-ray, the radiologist calls it no apparent __________but I think  there is haziness in both lung fields at the bases. There is no cardiomegaly  or any other pathology noted.   ASSESSMENT/PLAN:  1. Community acquired pneumonia clinically the picture fits the story.  The     chest x-ray there is no hyperextension on it but it may be that she might     have some early signs of possibility of early emphysema. This is     considering the fact that she has had two pneumonic processes last year     and she has gotten sick again this year.  Certainly some component of     maybe allergies triggering some of her symptoms. She certainly is     hypercapnic and it is hard to tell whether some of this was already there     prior to the O2 which I think is what it was. Will go ahead and by BIPAP     her to help with the CO2 retention.  She was feeling a little sleepy but     still awake. I suspect we will be able to take her off of BIPAP very     soon. Will do a repeat blood gas in an hour after the BIPAP.  Will     continue her on Solu-Medrol but will lower the dose to 80 q. 8.  Will     place her on ceftriaxone in addition to the Zithromax. Do the usual     Humibid and the Duonebs.  Please note that will  place her on 4 liters of     O2 and see whether we need to make adjustments.  2. In regards to her nasal congestion, I am going to keep her on her     Nasonex.  Will include a saline nasal spray which the nasal congestion     will certainly contribute towards her sense of shortness of breath and     affect her respiratory status.  3. With her being on high dose steroids will just go ahead and check her     CBG's and see if there is any suggestions for hyperglycemia.  Will place     her on some sliding scale coverage in the meantime if she does need it.  4. Will do Lovenox for DVT prophylaxis since she is going the bedridden at     least for the next 24 hours and with the pneumonia she probably want be     ambulating as much.  5.  Will also place her on GI prophylaxis with Protonix.                                               Isla Pence, M.D.    RRV/MEDQ  D:  11/21/2003  T:  11/22/2003  Job:  [id number]   cc:   Molly Maduro L. Foy Guadalajara, M.D.  [address] [place]  [place] [address]  Fax: [phone]

## 2011-02-28 ENCOUNTER — Other Ambulatory Visit: Payer: Self-pay | Admitting: Pulmonary Disease

## 2011-05-15 LAB — COMPREHENSIVE METABOLIC PANEL
ALT: 15
AST: 19
AST: 21
Albumin: 2.4 — ABNORMAL LOW
Albumin: 2.5 — ABNORMAL LOW
CO2: 33 — ABNORMAL HIGH
Calcium: 8.6
Calcium: 8.6
Chloride: 94 — ABNORMAL LOW
Creatinine, Ser: 0.53
Creatinine, Ser: 0.68
GFR calc Af Amer: >60
GFR calc Af Amer: >60
GFR calc non Af Amer: >60
Sodium: 135

## 2011-05-15 LAB — BLOOD GAS, ARTERIAL
Acid-Base Excess: 4 — ABNORMAL HIGH
Acid-Base Excess: 5.9 — ABNORMAL HIGH
Acid-Base Excess: 6.5 — ABNORMAL HIGH
Bicarbonate: 29.8 — ABNORMAL HIGH
Bicarbonate: 34.2 — ABNORMAL HIGH
Bicarbonate: 35.3 — ABNORMAL HIGH
Delivery systems: POSITIVE
Drawn by: 187461
Drawn by: 295541
FIO2: 0.6
O2 Content: 3
O2 Saturation: 97.3
O2 Saturation: 97.7
PEEP: 8
PEEP: 8
Patient temperature: 97.7
Patient temperature: 98.2
Patient temperature: 98.6
Patient temperature: 98.8
Pressure support: 14
Pressure support: 14
Pressure support: 6
RATE: 8
TCO2: 26.7
TCO2: 31.5
TCO2: 31.8
pCO2 arterial: 49.8 — ABNORMAL HIGH
pCO2 arterial: 66.4
pCO2 arterial: 68.4
pCO2 arterial: 70.4
pH, Arterial: 7.325 — ABNORMAL LOW
pH, Arterial: 7.333 — ABNORMAL LOW
pH, Arterial: 7.348 — ABNORMAL LOW
pO2, Arterial: 73.3 — ABNORMAL LOW
pO2, Arterial: 79.3 — ABNORMAL LOW

## 2011-05-15 LAB — BASIC METABOLIC PANEL
Chloride: 88 — ABNORMAL LOW
Creatinine, Ser: 0.55
GFR calc Af Amer: >60
Sodium: 130 — ABNORMAL LOW

## 2011-05-15 LAB — CBC
Hemoglobin: 14.9
MCHC: 33.5
MCHC: 33.6
MCV: 88.2
MCV: 88.5
MCV: 90
Platelets: 253
Platelets: 265
RBC: 4.55
RBC: 4.91
WBC: 17.8 — ABNORMAL HIGH
WBC: 18.5 — ABNORMAL HIGH
WBC: 22 — ABNORMAL HIGH

## 2011-05-15 LAB — DIFFERENTIAL
Eosinophils Absolute: 0
Eosinophils Relative: 0
Eosinophils Relative: 0
Eosinophils Relative: 0
Lymphocytes Relative: 4 — ABNORMAL LOW
Lymphocytes Relative: 6 — ABNORMAL LOW
Lymphs Abs: 0.6 — ABNORMAL LOW
Lymphs Abs: 1
Lymphs Abs: 1.1
Monocytes Absolute: 0.4
Monocytes Absolute: 1.2 — ABNORMAL HIGH
Monocytes Relative: 2 — ABNORMAL LOW
Monocytes Relative: 7
Neutrophils Relative %: 93 — ABNORMAL HIGH

## 2011-05-15 LAB — CULTURE, BLOOD (ROUTINE X 2): Culture: NO GROWTH

## 2011-05-15 LAB — LEGIONELLA ANTIGEN, URINE

## 2011-05-15 LAB — RAPID STREP SCREEN (MED CTR MEBANE ONLY): Streptococcus, Group A Screen (Direct): NEGATIVE

## 2011-07-31 ENCOUNTER — Other Ambulatory Visit: Payer: Self-pay | Admitting: Pulmonary Disease

## 2011-09-02 ENCOUNTER — Other Ambulatory Visit: Payer: Self-pay | Admitting: Pulmonary Disease

## 2011-10-05 ENCOUNTER — Other Ambulatory Visit: Payer: Self-pay | Admitting: Pulmonary Disease

## 2012-03-26 ENCOUNTER — Telehealth: Payer: Self-pay | Admitting: Pulmonary Disease

## 2012-03-26 NOTE — Telephone Encounter (Signed)
Called pt to schedule follow up apt x3.Sent letter 03/18/12. ° °

## 2013-11-20 ENCOUNTER — Encounter: Payer: Self-pay | Admitting: Pulmonary Disease

## 2013-11-20 ENCOUNTER — Telehealth: Payer: Self-pay | Admitting: Pulmonary Disease

## 2013-11-20 ENCOUNTER — Ambulatory Visit (INDEPENDENT_AMBULATORY_CARE_PROVIDER_SITE_OTHER): Payer: BC Managed Care – PPO | Admitting: Pulmonary Disease

## 2013-11-20 VITALS — BP 112/74 | HR 76 | Temp 98.2°F | Ht 64.0 in | Wt 169.1 lb

## 2013-11-20 DIAGNOSIS — J449 Chronic obstructive pulmonary disease, unspecified: Secondary | ICD-10-CM

## 2013-11-20 MED ORDER — ALBUTEROL SULFATE HFA 108 (90 BASE) MCG/ACT IN AERS: 2.0000 | INHALATION_SPRAY | Freq: Four times a day (QID) | RESPIRATORY_TRACT | Status: DC | PRN

## 2013-11-20 MED ORDER — FLUTICASONE FUROATE-VILANTEROL 100-25 MCG/INH IN AEPB: 1.0000 | INHALATION_SPRAY | Freq: Every day | RESPIRATORY_TRACT | Status: DC

## 2013-11-20 NOTE — Patient Instructions (Addendum)
Trial of BREO -call us if this works Stay on spiriva Rescue inhaler- albuterol 2puffs - every 6h as needed We will get you portable oxygen - you can try to get portable concentrator from [employer] You have to QUIT smoking CXR today

## 2013-11-20 NOTE — Telephone Encounter (Signed)
Order has been fixed. 

## 2013-11-20 NOTE — Assessment & Plan Note (Addendum)
Trial of BREO -call us if this works Stay on spiriva Rescue inhaler- albuterol 2puffs - every 6h as needed Stop Combivent,  ue to fear of excessive ipratropium We will get you portable oxygen - you can try to get portable concentrator from [employer] You have to QUIT smoking We discussed dangers of long-term low oxygen level, smoking cessation remains the most important intervention here.

## 2013-11-20 NOTE — Progress Notes (Signed)
   Subjective:    Patient ID: Samantha Dyer, female    DOB: 1951-03-12, 63 y.o.   MRN: [id number]  HPI 63/F smoker for FU of gold stg 4 COPD.  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 ABG on adm was 7.32/65/62 on O2 & 7.40/55/45 (RA) on dc, was put on O2.  Spirometry showed severe airway obstruction with FEV1 of 30% c/w Gold stg IV copd.      11/20/2013 [age]yr FU  Chief Complaint  Patient presents with  . Advice Only    last seen 2012. Breathing has been terrible x 2-3 weeks. C/o wheezing, chest tx, slight cough   Since last visit, says she is doing okay. Still works fulltime, does Engineer, agriculturalmainly computer work. Is fairly sedentary-light housework, minimal shopping due to DOE. She did return her O2 back to DME She continues to smoke a pack per day Her last quit attempt was a year ago, Chantix affected her nerves, nicotine patches and inhaler did not help she has tried electronic cigarette No ER visits or steroid use since last ov. She is compliant with Spiriva, and uses Combivent 4-6 times a day.  Past Medical History  Diagnosis Date  . COPD (chronic obstructive pulmonary disease)   . Esophageal reflux   . Anemia, unspecified   . Allergic rhinitis, cause unspecified   . Iron deficiency anemia, unspecified   . Backache, unspecified       Review of Systems  Constitutional: Negative for fever and unexpected weight change.  HENT: Positive for sneezing. Negative for congestion, dental problem, ear pain, nosebleeds, postnasal drip, rhinorrhea, sinus pressure, sore throat and trouble swallowing.   Eyes: Negative for redness and itching.  Respiratory: Positive for cough, chest tightness, shortness of breath and wheezing.   Cardiovascular: Negative for palpitations and leg swelling.  Gastrointestinal: Negative for nausea and vomiting.  Genitourinary: Negative for dysuria.  Musculoskeletal: Negative for joint swelling.  Skin:  Negative for rash.  Neurological: Negative for headaches.  Hematological: Does not bruise/bleed easily.  Psychiatric/Behavioral: Negative for dysphoric mood. The patient is not nervous/anxious.        Objective:   Physical Exam  Gen. Pleasant, well-nourished, in no distress, normal affect ENT - no lesions, no post nasal drip Neck: No JVD, no thyromegaly, no carotid bruits Lungs: no use of accessory muscles, no dullness to percussion, clear without rales or rhonchi  Cardiovascular: Rhythm regular, heart sounds  normal, no murmurs or gallops, no peripheral edema Abdomen: soft and non-tender, no hepatosplenomegaly, BS normal. Musculoskeletal: No deformities, no cyanosis or clubbing Neuro:  alert, non focal       Assessment & Plan:

## 2013-11-26 ENCOUNTER — Institutional Professional Consult (permissible substitution): Payer: BC Managed Care – PPO | Admitting: Pulmonary Disease

## 2013-12-04 ENCOUNTER — Telehealth: Payer: Self-pay | Admitting: Pulmonary Disease

## 2013-12-04 NOTE — Telephone Encounter (Signed)
Per OV 11/20/13: Trial of BREO -call us if this works Stay on spiriva Rescue inhaler- albuterol 2puffs - every 6h as needed We will get you portable oxygen - you can try to get portable concentrator from [employer] You have to QUIT smoking CXR today ---  Called # provided to call and is not in service. Called home # and LMTCB x1 w/ spouse

## 2013-12-05 MED ORDER — FLUTICASONE FUROATE-VILANTEROL 100-25 MCG/INH IN AEPB: 1.0000 | INHALATION_SPRAY | Freq: Every day | RESPIRATORY_TRACT | Status: DC

## 2013-12-05 NOTE — Telephone Encounter (Signed)
lmtcb x2 for pt w/ spouse. 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 Spoke with pt. She reports the breo has worked very well for her and wants RX called into [facility]. I have done so. Nothing further needed

## 2014-05-07 ENCOUNTER — Ambulatory Visit (INDEPENDENT_AMBULATORY_CARE_PROVIDER_SITE_OTHER): Payer: BC Managed Care – PPO | Admitting: Pulmonary Disease

## 2014-05-07 ENCOUNTER — Encounter: Payer: Self-pay | Admitting: Pulmonary Disease

## 2014-05-07 ENCOUNTER — Ambulatory Visit (HOSPITAL_BASED_OUTPATIENT_CLINIC_OR_DEPARTMENT_OTHER)
Admission: RE | Admit: 2014-05-07 | Discharge: 2014-05-07 | Disposition: A | Discharge status: Home / Self Care | Payer: BC Managed Care – PPO | Source: Ambulatory Visit | Attending: Pulmonary Disease | Admitting: Pulmonary Disease

## 2014-05-07 VITALS — BP 112/80 | HR 92 | Temp 96.9°F | Ht 64.0 in | Wt 164.4 lb

## 2014-05-07 DIAGNOSIS — R0602 Shortness of breath: Secondary | ICD-10-CM | POA: Insufficient documentation

## 2014-05-07 DIAGNOSIS — F17200 Nicotine dependence, unspecified, uncomplicated: Secondary | ICD-10-CM

## 2014-05-07 DIAGNOSIS — J449 Chronic obstructive pulmonary disease, unspecified: Secondary | ICD-10-CM | POA: Diagnosis not present

## 2014-05-07 DIAGNOSIS — J4489 Other specified chronic obstructive pulmonary disease: Secondary | ICD-10-CM

## 2014-05-07 NOTE — Progress Notes (Signed)
   Subjective:    Patient ID: Samantha Dyer, female    DOB: Mar 08, 1951, 63 y.o.   MRN: [id number]  HPI  63/F smoker for FU of gold stg 4 COPD.  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 ABG on adm was 7.32/65/62 on O2 & 7.40/55/45 (RA) on dc, was put on O2.  Spirometry showed severe airway obstruction with FEV1 of 30% c/w Gold stg IV copd.    05/07/2014  Chief Complaint  Patient presents with  . COPD    follow-up. Pt states she feels SOB is same last visit. She tried Breo but stopped due to it causing her to have a sore throat.    Still works fulltime, does Engineer, agricultural work. Is fairly sedentary-light housework, minimal shopping due to DOE.  She continues to smoke a pack per day  Her last quit attempt was a year ago, Chantix affected her nerves, nicotine patches and inhaler did not help she has tried electronic cigarette  No ER visits or steroid use since last ov.  She is compliant with Spiriva, and continues to use Combivent 4-6 times a day. In spite of my having asked her to stop his last visit. She uses oxygen on as needed basis  Review of Systems neg for any significant sore throat, dysphagia, itching, sneezing, nasal congestion or excess/ purulent secretions, fever, chills, sweats, unintended wt loss, pleuritic or exertional cp, hempoptysis, orthopnea pnd or change in chronic leg swelling. Also denies presyncope, palpitations, heartburn, abdominal pain, nausea, vomiting, diarrhea or change in bowel or urinary habits, dysuria,hematuria, rash, arthralgias, visual complaints, headache, numbness weakness or ataxia.     Objective:   Physical Exam  Gen. Pleasant, well-nourished, in no distress ENT - no lesions, no post nasal drip Neck: No JVD, no thyromegaly, no carotid bruits Lungs: no use of accessory muscles, no dullness to percussion, decreased bilateral without rales or rhonchi  Cardiovascular: Rhythm  regular, heart sounds  normal, no murmurs or gallops, no peripheral edema Musculoskeletal: No deformities, no cyanosis or clubbing         Assessment & Plan:

## 2014-05-07 NOTE — Patient Instructions (Signed)
CXR today Please make up your mind about QUITTING ! We can Rx Zyban to assist. Stay on spiriva & symbicort STOP combivent Use albuterol MDI 2 puffs as needed instead  Call at first signs of bronchitis

## 2014-05-07 NOTE — Assessment & Plan Note (Signed)
CXR today Please make up your mind about QUITTING ! We can Rx Zyban to assist. Stay on spiriva & symbicort STOP combivent Use albuterol MDI 2 puffs as needed instead  Call at first signs of bronchitis 

## 2014-05-08 NOTE — Assessment & Plan Note (Signed)
Again emphasized that this is the primary intervention We can call in Wellbutrin to assist

## 2014-06-30 ENCOUNTER — Telehealth: Payer: Self-pay | Admitting: Pulmonary Disease

## 2014-06-30 NOTE — Telephone Encounter (Signed)
Called spoke with pt. She is aware RA rec back in April she be on 2 liters cont flow. Nothing further needed

## 2014-07-01 ENCOUNTER — Telehealth: Payer: Self-pay | Admitting: Pulmonary Disease

## 2014-07-01 DIAGNOSIS — J9610 Chronic respiratory failure, unspecified whether with hypoxia or hypercapnia: Secondary | ICD-10-CM

## 2014-07-01 NOTE — Telephone Encounter (Signed)
She will have ot be checked on POC at rest & walking to see how much O2 she needs Can we ask DME to do this pl?

## 2014-07-01 NOTE — Telephone Encounter (Signed)
Spoke with the pt  She wants to have rx for POC so she can order one online rather than renting one  Please advise, thanks!

## 2014-07-02 NOTE — Telephone Encounter (Signed)
Called and spoke with  Pt and she stated that she does not want [facility] since [facility] are going to charge her $3000 for this and she can buy this online for $2000.  She is aware that order has been placed to have [facility] come and check her if they are able to and we will forward this to RA to make him aware that the pt will just need rx for the POC so she can buy this.  Will forward back to RA to make him aware.

## 2014-07-08 NOTE — Telephone Encounter (Signed)
[facility] ?

## 2014-07-08 NOTE — Telephone Encounter (Signed)
[facility] and spoke with representative Sue LushAndrea.  Pt is [facility]. Will forward back to RA so that he is aware

## 2014-07-15 NOTE — Telephone Encounter (Signed)
Call[facility] and [name] Pt was evaluated for POC. Pt tolerated 3lpm pulsed dose. [facility] did not collect and room air sat. Pt at rest with 3lpm pulsed was 94% and 3lpm pulsed ambulating at 95%. Pt stated she would purchase her own POC instead of through a DME company.   Will forward to RA to make aware.

## 2014-07-15 NOTE — Telephone Encounter (Signed)
OK to write Rx for 3L/m pulse o2 at rest & exertion

## 2014-07-15 NOTE — Telephone Encounter (Signed)
Called and spoke to patients husband, he states [facility]  Order placed.  Nothing further needed.

## 2014-07-28 ENCOUNTER — Telehealth: Payer: Self-pay | Admitting: Pulmonary Disease

## 2014-07-28 DIAGNOSIS — J9610 Chronic respiratory failure, unspecified whether with hypoxia or hypercapnia: Secondary | ICD-10-CM

## 2014-07-28 NOTE — Telephone Encounter (Signed)
Please see previous phone note-and send a prescription for 3 L portable oxygen concentrator to patient

## 2014-07-28 NOTE — Telephone Encounter (Signed)
Pt is aware that I am sending message to RA and Marcelino DusterMichelle to advise if they have received report. If not please obtain report from DME. Pt requests Rx be mailed to her home address for concentrator. Thanks.

## 2014-07-30 NOTE — Telephone Encounter (Signed)
Order placed for POC.  Samantha Dyer has signed this, will place in mail in [place] office today.  Nothing further needed.

## 2014-07-30 NOTE — Telephone Encounter (Signed)
Pt is requesting RX for concentrator to purchase online. [facility]. Needs liter flow on this. Pt wants this mailed to her. Morrie Sheldonshley is in [place] this afternoon to have RA sign RX for this. Please advise thanks

## 2015-08-28 ENCOUNTER — Encounter (HOSPITAL_BASED_OUTPATIENT_CLINIC_OR_DEPARTMENT_OTHER): Payer: Self-pay | Admitting: *Deleted

## 2015-08-28 ENCOUNTER — Inpatient Hospital Stay (HOSPITAL_BASED_OUTPATIENT_CLINIC_OR_DEPARTMENT_OTHER)
Admission: EM | Admit: 2015-08-28 | Discharge: 2015-08-29 | DRG: 190 | Discharge status: Against Medical Advice | Payer: 59 | Attending: Internal Medicine | Admitting: Internal Medicine

## 2015-08-28 ENCOUNTER — Emergency Department (HOSPITAL_BASED_OUTPATIENT_CLINIC_OR_DEPARTMENT_OTHER): Payer: 59

## 2015-08-28 DIAGNOSIS — F17210 Nicotine dependence, cigarettes, uncomplicated: Secondary | ICD-10-CM | POA: Diagnosis present

## 2015-08-28 DIAGNOSIS — Z881 Allergy status to other antibiotic agents status: Secondary | ICD-10-CM | POA: Diagnosis not present

## 2015-08-28 DIAGNOSIS — J9600 Acute respiratory failure, unspecified whether with hypoxia or hypercapnia: Secondary | ICD-10-CM | POA: Diagnosis present

## 2015-08-28 DIAGNOSIS — F17200 Nicotine dependence, unspecified, uncomplicated: Secondary | ICD-10-CM

## 2015-08-28 DIAGNOSIS — Z9119 Patient's noncompliance with other medical treatment and regimen: Secondary | ICD-10-CM | POA: Diagnosis not present

## 2015-08-28 DIAGNOSIS — J9601 Acute respiratory failure with hypoxia: Secondary | ICD-10-CM | POA: Diagnosis present

## 2015-08-28 DIAGNOSIS — K219 Gastro-esophageal reflux disease without esophagitis: Secondary | ICD-10-CM | POA: Diagnosis present

## 2015-08-28 DIAGNOSIS — Z9981 Dependence on supplemental oxygen: Secondary | ICD-10-CM | POA: Diagnosis not present

## 2015-08-28 DIAGNOSIS — J441 Chronic obstructive pulmonary disease with (acute) exacerbation: Principal | ICD-10-CM | POA: Diagnosis present

## 2015-08-28 DIAGNOSIS — E871 Hypo-osmolality and hyponatremia: Secondary | ICD-10-CM | POA: Diagnosis present

## 2015-08-28 DIAGNOSIS — Z888 Allergy status to other drugs, medicaments and biological substances status: Secondary | ICD-10-CM

## 2015-08-28 DIAGNOSIS — J9602 Acute respiratory failure with hypercapnia: Secondary | ICD-10-CM | POA: Diagnosis present

## 2015-08-28 LAB — I-STAT ARTERIAL BLOOD GAS, ED
ACID-BASE EXCESS: 3 mmol/L — AB (ref 0.0–2.0)
ACID-BASE EXCESS: 3 mmol/L — AB (ref 0.0–2.0)
Acid-Base Excess: 3 mmol/L — ABNORMAL HIGH (ref 0.0–2.0)
BICARBONATE: 33.9 meq/L — AB (ref 20.0–24.0)
BICARBONATE: 34.9 meq/L — AB (ref 20.0–24.0)
Bicarbonate: 33.4 mEq/L — ABNORMAL HIGH (ref 20.0–24.0)
O2 SAT: 83 %
O2 Saturation: 83 %
O2 Saturation: 93 %
PH ART: 7.244 — AB (ref 7.350–7.450)
PH ART: 7.216 — AB (ref 7.350–7.450)
PO2 ART: 58 mmHg — AB (ref 80.0–100.0)
TCO2: 36 mmol/L (ref 0–100)
TCO2: 36 mmol/L (ref 0–100)
TCO2: 37 mmol/L (ref 0–100)
pCO2 arterial: 77 mmHg (ref 35.0–45.0)
pCO2 arterial: 78.8 mmHg (ref 35.0–45.0)
pCO2 arterial: 85.9 mmHg (ref 35.0–45.0)
pH, Arterial: 7.241 — ABNORMAL LOW (ref 7.350–7.450)
pO2, Arterial: 58 mmHg — ABNORMAL LOW (ref 80.0–100.0)
pO2, Arterial: 83 mmHg (ref 80.0–100.0)

## 2015-08-28 LAB — CBC
HCT: 42.2 % (ref 36.0–46.0)
Hemoglobin: 13.7 g/dL (ref 12.0–15.0)
MCH: 29.3 pg (ref 26.0–34.0)
MCHC: 32.5 g/dL (ref 30.0–36.0)
MCV: 90.2 fL (ref 78.0–100.0)
PLATELETS: 127 10*3/uL — AB (ref 150–400)
RBC: 4.68 MIL/uL (ref 3.87–5.11)
RDW: 14.2 % (ref 11.5–15.5)
WBC: 6.2 10*3/uL (ref 4.0–10.5)

## 2015-08-28 LAB — CBC WITH DIFFERENTIAL/PLATELET
BASOS ABS: 0 10*3/uL (ref 0.0–0.1)
BASOS PCT: 0 %
EOS PCT: 1 %
Eosinophils Absolute: 0.1 10*3/uL (ref 0.0–0.7)
HEMATOCRIT: 44.1 % (ref 36.0–46.0)
Hemoglobin: 14.5 g/dL (ref 12.0–15.0)
Lymphocytes Relative: 8 %
Lymphs Abs: 0.6 10*3/uL — ABNORMAL LOW (ref 0.7–4.0)
MCH: 29.8 pg (ref 26.0–34.0)
MCHC: 32.9 g/dL (ref 30.0–36.0)
MCV: 90.6 fL (ref 78.0–100.0)
MONO ABS: 0.7 10*3/uL (ref 0.1–1.0)
MONOS PCT: 9 %
NEUTROS ABS: 6.4 10*3/uL (ref 1.7–7.7)
Neutrophils Relative %: 82 %
PLATELETS: 161 10*3/uL (ref 150–400)
RBC: 4.87 MIL/uL (ref 3.87–5.11)
RDW: 14.6 % (ref 11.5–15.5)
WBC: 7.7 10*3/uL (ref 4.0–10.5)

## 2015-08-28 LAB — CREATININE, SERUM: Creatinine, Ser: 0.6 mg/dL (ref 0.44–1.00)

## 2015-08-28 LAB — COMPREHENSIVE METABOLIC PANEL
ALT: 13 U/L — ABNORMAL LOW (ref 14–54)
ANION GAP: 4 — AB (ref 5–15)
AST: 23 U/L (ref 15–41)
Albumin: 3.7 g/dL (ref 3.5–5.0)
Alkaline Phosphatase: 97 U/L (ref 38–126)
BILIRUBIN TOTAL: 0.7 mg/dL (ref 0.3–1.2)
BUN: 5 mg/dL — ABNORMAL LOW (ref 6–20)
CHLORIDE: 88 mmol/L — AB (ref 101–111)
CO2: 36 mmol/L — ABNORMAL HIGH (ref 22–32)
Calcium: 8.5 mg/dL — ABNORMAL LOW (ref 8.9–10.3)
Creatinine, Ser: 0.64 mg/dL (ref 0.44–1.00)
GFR calc Af Amer: >60 mL/min (ref >60)
Glucose, Bld: 122 mg/dL — ABNORMAL HIGH (ref 65–99)
POTASSIUM: 4.3 mmol/L (ref 3.5–5.1)
Sodium: 128 mmol/L — ABNORMAL LOW (ref 135–145)
TOTAL PROTEIN: 6.7 g/dL (ref 6.5–8.1)

## 2015-08-28 LAB — TROPONIN I
TROPONIN I: 0.73 ng/mL — AB (ref <0.031)
TROPONIN I: 0.56 ng/mL — AB (ref <0.031)
Troponin I: 0.51 ng/mL (ref <0.031)

## 2015-08-28 LAB — MRSA PCR SCREENING: MRSA by PCR: NEGATIVE

## 2015-08-28 MED ORDER — LEVOFLOXACIN IN D5W 750 MG/150ML IV SOLN
750.0000 mg | INTRAVENOUS | Status: DC
  Administered 2015-08-28: 750 mg via INTRAVENOUS
  Filled 2015-08-28: qty 150

## 2015-08-28 MED ORDER — ALBUTEROL (5 MG/ML) CONTINUOUS INHALATION SOLN
10.0000 mg/h | INHALATION_SOLUTION | RESPIRATORY_TRACT | Status: DC
  Administered 2015-08-28: 10 mg/h via RESPIRATORY_TRACT
  Filled 2015-08-28: qty 40
  Filled 2015-08-28: qty 20

## 2015-08-28 MED ORDER — IPRATROPIUM-ALBUTEROL 0.5-2.5 (3) MG/3ML IN SOLN
3.0000 mL | Freq: Once | RESPIRATORY_TRACT | Status: AC
  Administered 2015-08-28: 3 mL via RESPIRATORY_TRACT

## 2015-08-28 MED ORDER — IPRATROPIUM-ALBUTEROL 0.5-2.5 (3) MG/3ML IN SOLN
3.0000 mL | RESPIRATORY_TRACT | Status: DC | PRN
  Administered 2015-08-29: 3 mL via RESPIRATORY_TRACT
  Filled 2015-08-28: qty 3

## 2015-08-28 MED ORDER — FLUTICASONE FUROATE-VILANTEROL 100-25 MCG/INH IN AEPB: 1.0000 | INHALATION_SPRAY | Freq: Every day | RESPIRATORY_TRACT | Status: DC

## 2015-08-28 MED ORDER — FAMOTIDINE 20 MG PO TABS
20.0000 mg | ORAL_TABLET | Freq: Once | ORAL | Status: AC
  Administered 2015-08-28: 20 mg via ORAL

## 2015-08-28 MED ORDER — PANTOPRAZOLE SODIUM 40 MG PO TBEC
40.0000 mg | DELAYED_RELEASE_TABLET | Freq: Every day | ORAL | Status: DC
Start: 2015-08-28 — End: 2015-08-29
  Administered 2015-08-28: 40 mg via ORAL
  Filled 2015-08-28: qty 1

## 2015-08-28 MED ORDER — DEXTROSE 5 % IV SOLN: 500.0000 mg | INTRAVENOUS | Status: DC

## 2015-08-28 MED ORDER — METHYLPREDNISOLONE SODIUM SUCC 125 MG IJ SOLR
60.0000 mg | Freq: Four times a day (QID) | INTRAMUSCULAR | Status: DC
  Administered 2015-08-28 – 2015-08-29 (×2): 60 mg via INTRAVENOUS
  Filled 2015-08-28 (×2): qty 2

## 2015-08-28 MED ORDER — AZITHROMYCIN 500 MG IV SOLR
INTRAVENOUS | Status: AC
  Filled 2015-08-28: qty 500

## 2015-08-28 MED ORDER — IPRATROPIUM-ALBUTEROL 0.5-2.5 (3) MG/3ML IN SOLN
RESPIRATORY_TRACT | Status: AC
  Administered 2015-08-28: 3 mL via RESPIRATORY_TRACT
  Filled 2015-08-28: qty 3

## 2015-08-28 MED ORDER — MAGNESIUM SULFATE 50 % IJ SOLN
2.0000 g | Freq: Once | INTRAMUSCULAR | Status: AC
Start: 2015-08-28 — End: 2015-08-28
  Administered 2015-08-28: 2 g via INTRAVENOUS
  Filled 2015-08-28: qty 4

## 2015-08-28 MED ORDER — BUDESONIDE-FORMOTEROL FUMARATE 160-4.5 MCG/ACT IN AERO
2.0000 | INHALATION_SPRAY | Freq: Two times a day (BID) | RESPIRATORY_TRACT | Status: DC
  Filled 2015-08-28: qty 6

## 2015-08-28 MED ORDER — TIOTROPIUM BROMIDE MONOHYDRATE 18 MCG IN CAPS
18.0000 ug | ORAL_CAPSULE | Freq: Every day | RESPIRATORY_TRACT | Status: DC
  Filled 2015-08-28: qty 5

## 2015-08-28 MED ORDER — ALBUTEROL SULFATE (2.5 MG/3ML) 0.083% IN NEBU
INHALATION_SOLUTION | RESPIRATORY_TRACT | Status: AC
  Administered 2015-08-28: 2.5 mg
  Filled 2015-08-28: qty 3

## 2015-08-28 MED ORDER — METHYLPREDNISOLONE SODIUM SUCC 125 MG IJ SOLR
125.0000 mg | Freq: Once | INTRAMUSCULAR | Status: AC
  Administered 2015-08-28: 125 mg via INTRAVENOUS
  Filled 2015-08-28: qty 2

## 2015-08-28 MED ORDER — AZITHROMYCIN 250 MG PO TABS
500.0000 mg | ORAL_TABLET | Freq: Once | ORAL | Status: DC
  Filled 2015-08-28: qty 2

## 2015-08-28 MED ORDER — SODIUM CHLORIDE 0.9 % IJ SOLN: 3.0000 mL | INTRAMUSCULAR | Status: DC | PRN

## 2015-08-28 MED ORDER — ONDANSETRON HCL 4 MG/2ML IJ SOLN: 4.0000 mg | Freq: Four times a day (QID) | INTRAMUSCULAR | Status: DC | PRN

## 2015-08-28 MED ORDER — ALUM & MAG HYDROXIDE-SIMETH 200-200-20 MG/5ML PO SUSP: 30.0000 mL | Freq: Four times a day (QID) | ORAL | Status: DC | PRN

## 2015-08-28 MED ORDER — OXYCODONE HCL 5 MG PO TABS: 5.0000 mg | ORAL_TABLET | ORAL | Status: DC | PRN

## 2015-08-28 MED ORDER — SODIUM CHLORIDE 0.9 % IV BOLUS (SEPSIS)
1000.0000 mL | Freq: Once | INTRAVENOUS | Status: AC
  Administered 2015-08-28: 1000 mL via INTRAVENOUS

## 2015-08-28 MED ORDER — SODIUM CHLORIDE 0.9 % IJ SOLN
3.0000 mL | Freq: Two times a day (BID) | INTRAMUSCULAR | Status: DC
  Administered 2015-08-28: 3 mL via INTRAVENOUS

## 2015-08-28 MED ORDER — SODIUM CHLORIDE 0.9 % IV SOLN
INTRAVENOUS | Status: DC
  Administered 2015-08-28: 19:00:00 via INTRAVENOUS

## 2015-08-28 MED ORDER — FAMOTIDINE 20 MG PO TABS
ORAL_TABLET | ORAL | Status: AC
  Filled 2015-08-28: qty 1

## 2015-08-28 MED ORDER — DEXTROSE 5 % IV SOLN: 1.0000 g | INTRAVENOUS | Status: DC

## 2015-08-28 MED ORDER — NICOTINE 21 MG/24HR TD PT24
21.0000 mg | MEDICATED_PATCH | Freq: Every day | TRANSDERMAL | Status: DC
  Administered 2015-08-28: 21 mg via TRANSDERMAL
  Filled 2015-08-28: qty 1

## 2015-08-28 MED ORDER — ALBUTEROL SULFATE (2.5 MG/3ML) 0.083% IN NEBU
5.0000 mg | INHALATION_SOLUTION | Freq: Once | RESPIRATORY_TRACT | Status: AC
  Administered 2015-08-28: 5 mg via RESPIRATORY_TRACT
  Filled 2015-08-28: qty 6

## 2015-08-28 MED ORDER — ACETAMINOPHEN 650 MG RE SUPP: 650.0000 mg | Freq: Four times a day (QID) | RECTAL | Status: DC | PRN

## 2015-08-28 MED ORDER — ONDANSETRON HCL 4 MG PO TABS: 4.0000 mg | ORAL_TABLET | Freq: Four times a day (QID) | ORAL | Status: DC | PRN

## 2015-08-28 MED ORDER — ACETAMINOPHEN 325 MG PO TABS: 650.0000 mg | ORAL_TABLET | Freq: Four times a day (QID) | ORAL | Status: DC | PRN

## 2015-08-28 MED ORDER — GUAIFENESIN ER 600 MG PO TB12
1200.0000 mg | ORAL_TABLET | Freq: Two times a day (BID) | ORAL | Status: DC
  Administered 2015-08-28: 1200 mg via ORAL
  Filled 2015-08-28: qty 2

## 2015-08-28 MED ORDER — ENOXAPARIN SODIUM 40 MG/0.4ML ~~LOC~~ SOLN
40.0000 mg | SUBCUTANEOUS | Status: DC
  Administered 2015-08-28: 40 mg via SUBCUTANEOUS
  Filled 2015-08-28: qty 0.4

## 2015-08-28 MED ORDER — IPRATROPIUM-ALBUTEROL 0.5-2.5 (3) MG/3ML IN SOLN
RESPIRATORY_TRACT | Status: AC
  Filled 2015-08-28: qty 3

## 2015-08-28 MED ORDER — DEXTROSE 5 % IV SOLN
500.0000 mg | Freq: Once | INTRAVENOUS | Status: AC
  Administered 2015-08-28: 500 mg via INTRAVENOUS

## 2015-08-28 MED ORDER — BUDESONIDE-FORMOTEROL FUMARATE 160-4.5 MCG/ACT IN AERO: 2.0000 | INHALATION_SPRAY | Freq: Two times a day (BID) | RESPIRATORY_TRACT | Status: DC

## 2015-08-28 MED ORDER — SODIUM CHLORIDE 0.9 % IV SOLN: 250.0000 mL | INTRAVENOUS | Status: DC | PRN

## 2015-08-28 NOTE — H&P (Signed)
[facility] History and Physical  Samantha Dyer GNF:[id number] DOB: [birth year] DOA: 08/28/2015  Referring physician:  PCP: Lenora Boys, MD   Chief Complaint: Shortness of breath/cough  HPI: Samantha Dyer is a 65 y.o. female with a past medical history of tobacco abuse (50 pack year history) chronic obstructive pulmonary disease, presenting as a transfer from [facility] where she initially presented with complaints of cough and shortness of breath. She states feeling sick since 08/22/2015, having progressive shortness of breath associated with cough, wheezing, congestion, chills since then. Shortness of breath becoming worse in the past 24 hours for which she presented to med center. She denies fevers, chills, night sweats, nausea, vomiting, chest pain, abdominal pain. She reports utilizing albuterol at home without having significant relief. Lab work at [facility] cleared ABG that showed a pH of 7.244 with PCO2 of 77 and PaO2 of 58. She was placed on BiPAP and transferred to stepdown unit at . At baseline she reports using oxygen at nighttime. She is able to perform all activities of daily living.                                 Review of Systems:  Constitutional:  No weight loss, night sweats, Fevers, chills, fatigue.  HEENT:  No headaches, Difficulty swallowing,Tooth/dental problems,Sore throat,  No sneezing, itching, ear ache, nasal congestion, post nasal drip,  Cardio-vascular:  No chest pain, Orthopnea, PND, swelling in lower extremities, anasarca, dizziness, palpitations  GI:  No heartburn, indigestion, abdominal pain, nausea, vomiting, diarrhea, change in bowel habits, loss of appetite  Resp:  Positive for shortness of breath with exertion or at rest. No excess mucus, no productive cough, No non-productive cough, No coughing up of blood.No change in color of mucus.No wheezing.No chest wall deformity  Skin:  no rash or lesions.  GU:  no  dysuria, change in color of urine, no urgency or frequency. No flank pain.  Musculoskeletal:  No joint pain or swelling. No decreased range of motion. No back pain.  Psych:  No change in mood or affect. No depression or anxiety. No memory loss.   Past Medical History  Diagnosis Date  . COPD (chronic obstructive pulmonary disease) (HCC)   . Esophageal reflux   . Anemia, unspecified   . Allergic rhinitis, cause unspecified   . Iron deficiency anemia, unspecified   . Backache, unspecified    History reviewed. No pertinent past surgical history. Social History:  reports that she has been smoking Cigarettes.  She has a 43 pack-year smoking history. She has never used smokeless tobacco. She reports that she does not drink alcohol. Her drug history is not on file.  Allergies  Allergen Reactions  . Amoxicillin-Pot Clavulanate     REACTION: swelling  . Prednisone     "drives her crazy"    Family History  Problem Relation Age of Onset  . Emphysema Mother   . Heart disease Mother   . Breast cancer Mother   . Pancreatic cancer Father     Prior to Admission medications   Medication Sig Start Date End Date Taking? Authorizing Provider  albuterol (PROAIR HFA) 108 (90 BASE) MCG/ACT inhaler Inhale 1-2 puffs into the lungs every 6 (six) hours as needed for wheezing or shortness of breath.    Historical Provider, MD  albuterol-ipratropium (COMBIVENT) 18-103 MCG/ACT inhaler Inhale 2 puffs into the lungs every 6 (six)  hours as needed.      Historical Provider, MD  nabumetone (RELAFEN) 500 MG tablet Take 500 mg by mouth 2 (two) times daily.     Historical Provider, MD  SYMBICORT 160-4.5 MCG/ACT inhaler INHALE 2 PUFFS TWO TIMES A DAY 09/02/11 09/03/12  Oretha Milch, MD  SYMBICORT 160-4.5 MCG/ACT inhaler Inhale 2 puffs into the lungs 2 (two) times daily. 05/04/14   Historical Provider, MD  tiotropium (SPIRIVA) 18 MCG inhalation capsule Place 18 mcg into inhaler and inhale daily.    Historical  Provider, MD   Physical Exam: Filed Vitals:   08/28/15 1545 08/28/15 1554 08/28/15 1600 08/28/15 1624  BP: 148/76 151/76 149/77   Pulse: 120 119 118   Temp:      Resp: 24 21 23    SpO2: 89% 86% 90% 94%    Wt Readings from Last 3 Encounters:  05/07/14 74.571 kg (164 lb 6.4 oz)  11/20/13 76.712 kg (169 lb 1.9 oz)  12/27/10 77.792 kg (171 lb 8 oz)    General:  She appears calm at the time being, awake and alert in no acute distress. He is currently on 2 L supplemental oxygen via nasal cannula. Eyes: PERRL, normal lids, irises & conjunctiva ENT: grossly normal hearing, lips & tongue Neck: no LAD, masses or thyromegaly Cardiovascular: Tachycardic RRR, no m/r/g. No LE edema. Telemetry: SR, no arrhythmias  Respiratory: She has bilateral expiratory wheezes with a prolonged expiratory phase, crackles, diminished breath sounds Abdomen: soft, ntnd Skin: no rash or induration seen on limited exam Musculoskeletal: grossly normal tone BUE/BLE Psychiatric: grossly normal mood and affect, speech fluent and appropriate Neurologic: grossly non-focal.          Labs on Admission:  Basic Metabolic Panel:  Recent Labs Lab 08/28/15 1055  NA 128*  K 4.3  CL 88*  CO2 36*  GLUCOSE 122*  BUN 5*  CREATININE 0.64  CALCIUM 8.5*   Liver Function Tests:  Recent Labs Lab 08/28/15 1055  AST 23  ALT 13*  ALKPHOS 97  BILITOT 0.7  PROT 6.7  ALBUMIN 3.7   No results for input(s): LIPASE, AMYLASE in the last 168 hours. No results for input(s): AMMONIA in the last 168 hours. CBC:  Recent Labs Lab 08/28/15 1055  WBC 7.7  NEUTROABS 6.4  HGB 14.5  HCT 44.1  MCV 90.6  PLT 161   Cardiac Enzymes:  Recent Labs Lab 08/28/15 1055 08/28/15 1528  TROPONINI 0.73* 0.56*    BNP (last 3 results) No results for input(s): BNP in the last 8760 hours.  ProBNP (last 3 results) No results for input(s): PROBNP in the last 8760 hours.  CBG: No results for input(s): GLUCAP in the last 168  hours.  Radiological Exams on Admission: Dg Chest 2 View  08/28/2015  CLINICAL DATA:  Shortness of breath for 6 days, worsening. Initial encounter. EXAM: CHEST  2 VIEW COMPARISON:  PA and lateral chest 05/07/2014 and 08/17/2012. FINDINGS: There is peribronchial thickening. Heart size is upper normal. No consolidative process, pneumothorax or effusion. Atherosclerosis is noted. No focal bony abnormality. IMPRESSION: Chronic bronchitic change.  No acute abnormality. Electronically Signed   By: Drusilla Kanner M.D.   On: 08/28/2015 11:24    EKG: Independently reviewed.   Assessment/Plan Principal Problem:   COPD exacerbation (HCC) Active Problems:   Acute respiratory failure (HCC)   TOBACCO ABUSE   Esophageal reflux   1. Acute hypoxemic hypercarbic respiratory failure. Patient with history of chronic obstructive pulmonary disease presenting with complaints  of shortness of breath to outside facility. There she had an ABG that revealed pH of 7.2 with PCO2 of 77 with PaO2 of 58. She was started on BiPAP. Respiratory failure likely secondary to acute COPD exacerbation. Plan to treat COPD exacerbation with IV steroids, scheduled duo nebs, empiric IV antibiotic therapy. She currently does not appear to be in severe distress however will repeat ABG at this time given [facility].  2. Chronic obstructive pulmonary disease exacerbation. Patient presenting with acute hypoxemic hypercarbic respiratory failure associated with extensive wheezing. She is a current smoker. Chest x-ray performed at outside facility did not reveal acute infiltrate. Will continue systemic steroids with Solu-Medrol 60 mg IV every 6 hours, scheduled duo nebs every 4 hours, empiric IV antibiotic therapy with ceftriaxone and azithromycin. I am repeat an ABG at this time, may need noninvasive positive pressure ventilation. 3. Tobacco abuse. Patient was counseled. Will place nicotine  patch. 4. History of gastroesophageal reflux disease. Protonix 40 mg by mouth daily 5. Hyponatremia. She does not appear volume overloaded on exam. Patient having sodium of 128. Will provide gentle IV fluid resuscitation overnight. 6. Elevated troponins. Labs showing troponin of 0.7 at [facility] [place]. Will cycle troponins overnight. She currently denies chest pain. Obtain transthoracic echocardiogram in a.m. Elevated troponins could be secondary to demand ischemia, will follow serial troponins overnight. 7. DVT prophylaxis. Lovenox   Code Status: Limited code Family Communication: Family not present Disposition Plan: Anticipate patient will require greater than 2 nights hospitalization  Time spent: 70 min  Jeralyn BennettZAMORA, Haille Pardi [facility] Pager [phone]

## 2015-08-28 NOTE — Progress Notes (Signed)
65 year old female with history of COPD on home 1302 presentign with 4 days increasing SOB at home. Patient has been using inhalors as perscribed. Patient's lungs are diminished bilaterally. Occasional wheezes. Received  duonebs,  CXR  Chronic bronchitic change. No acute abnormality.. Patient has home oxygen and is satting at 95% on 2 L. NO pneumonia. Sodium 128 Started on azithro and 125 mg steroids , EDP requested step down, on bipap   .

## 2015-08-28 NOTE — ED Notes (Signed)
Report to Brooke RN with [facility] 

## 2015-08-28 NOTE — ED Notes (Signed)
MD aware of critical CO2. Patient looks better than she has looked the whole time she has been here, and stated she feels better. I decreased FIO2 to 30%. Will obtain another ABG in 30 minutes.

## 2015-08-28 NOTE — ED Notes (Signed)
Patient taken off BiPAP at this time per her request. We allowed her to have a few ice chips. Will re-evaluate in just a few minutes.

## 2015-08-28 NOTE — ED Notes (Signed)
MD aware of latest ABG results. patient stated that she feels much better despite still increased CO2. CO2 is decreased from last ABG. Still on BiPAP, no changes made.

## 2015-08-28 NOTE — Progress Notes (Signed)
repeat  ABG PH 7.240  PC02 82.6  PO2 73 HC03 35.4 SPOKE WITH PATIENT ON IMPORTANCE OF USING HER BiPap pt stated she would try RT at the bedside, will continue to monitor

## 2015-08-28 NOTE — ED Notes (Signed)
MD notified on critical CO2 ncreased BiPAP to 16/8 repeat ABG in 1 hour.

## 2015-08-28 NOTE — ED Notes (Signed)
Patient states that she has had SOB since Monday but it has grown worse during the week. Sleeps with oxygen during the night. She states that her o2 sat run in the 80's

## 2015-08-28 NOTE — ED Provider Notes (Addendum)
CSN: [id number]     Arrival date & time 08/28/15  1021 History   None    Chief Complaint  Patient presents with  . Shortness of Breath     (Consider location/radiation/quality/duration/timing/severity/associated sxs/prior Treatment) HPI   Patient is a pleasant 65 year old female with advanced COPD. [facility], Dr. Cathi RoanAlba. Patient seen once a year and on 3 different inhalers at home. Patient on 2 L home oxygen.  Patient reports aren't to feel shortness breath on Monday. She says she woke up Monday morning with a cold. Since then she's felt increasing cough. She feels like she is unable to cough up much. She feels increasingly short of breath and has been wearing her oxygen around the clock.  She reports she's around 85% on room air usually. This is consistent with what she has today in the emergency department.  Patient is still actively smoking. No PE risk factors.   Past Medical History  Diagnosis Date  . COPD (chronic obstructive pulmonary disease) (HCC)   . Esophageal reflux   . Anemia, unspecified   . Allergic rhinitis, cause unspecified   . Iron deficiency anemia, unspecified   . Backache, unspecified    History reviewed. No pertinent past surgical history. Family History  Problem Relation Age of Onset  . Emphysema Mother   . Heart disease Mother   . Breast cancer Mother   . Pancreatic cancer Father    Social History  Substance Use Topics  . Smoking status: Current Every Day Smoker -- 1.00 packs/day for 43 years    Types: Cigarettes  . Smokeless tobacco: Never Used  . Alcohol Use: No   OB History    No data available     Review of Systems  Constitutional: Positive for fatigue. Negative for activity change.  HENT: Positive for congestion.   Eyes: Negative for discharge.  Respiratory: Positive for cough, shortness of breath and wheezing. Negative for chest tightness.   Cardiovascular: Negative for chest pain.  Gastrointestinal: Negative  for abdominal distention.  Genitourinary: Negative for dysuria and difficulty urinating.  Musculoskeletal: Negative for joint swelling.  Skin: Negative for rash.  Allergic/Immunologic: Negative for immunocompromised state.  Neurological: Negative for seizures.  Psychiatric/Behavioral: Negative for agitation.      Allergies  Amoxicillin-pot clavulanate and Prednisone  Home Medications   Prior to Admission medications   Medication Sig Start Date End Date Taking? Authorizing Provider  albuterol (PROAIR HFA) 108 (90 BASE) MCG/ACT inhaler Inhale 1-2 puffs into the lungs every 6 (six) hours as needed for wheezing or shortness of breath.    Historical Provider, MD  albuterol-ipratropium (COMBIVENT) 18-103 MCG/ACT inhaler Inhale 2 puffs into the lungs every 6 (six) hours as needed.      Historical Provider, MD  nabumetone (RELAFEN) 500 MG tablet Take 500 mg by mouth 2 (two) times daily.     Historical Provider, MD  SYMBICORT 160-4.5 MCG/ACT inhaler INHALE 2 PUFFS TWO TIMES A DAY 09/02/11 09/03/12  Oretha Milchakesh [name] V, MD  SYMBICORT 160-4.5 MCG/ACT inhaler Inhale 2 puffs into the lungs 2 (two) times daily. 05/04/14   Historical Provider, MD  tiotropium (SPIRIVA) 18 MCG inhalation capsule Place 18 mcg into inhaler and inhale daily.    Historical Provider, MD   BP 130/62 mmHg  Pulse 119  Temp(Src) 98.3 F (36.8 C)  Resp 24  SpO2 93% Physical Exam  Constitutional: She is oriented to person, place, and time. She appears well-developed and well-nourished.  HENT:  Head: Normocephalic and atraumatic.  Eyes: Conjunctivae are normal. Right eye exhibits no discharge.  Neck: Neck supple.  Cardiovascular: Normal rate, regular rhythm and normal heart sounds.   No murmur heard. Pulmonary/Chest:  Tahcypnic, +coughing., Very diminished bilaterally occasional wheezes  Abdominal: Soft. She exhibits no distension. There is no tenderness.  Musculoskeletal: Normal range of motion. She exhibits no edema.   Neurological: She is oriented to person, place, and time. No cranial nerve deficit.  Skin: Skin is warm and dry. No rash noted. She is not diaphoretic.  Psychiatric: She has a normal mood and affect. Her behavior is normal.  Nursing note and vitals reviewed.   ED Course  Procedures (including critical care time) Labs Review Labs Reviewed  CBC WITH DIFFERENTIAL/PLATELET  COMPREHENSIVE METABOLIC PANEL    Imaging Review No results found. I have personally reviewed and evaluated these images and lab results as part of my medical decision-making.   EKG Interpretation   Date/Time:  Saturday August 28 2015 10:29:12 EST Ventricular Rate:  116 PR Interval:  154 QRS Duration: 78 QT Interval:  306 QTC Calculation: 425 R Axis:   71 Text Interpretation:  Sinus tachycardia Anteroseptal infarct , age  undetermined Abnormal ECG no acuet ishcemia tachy more than last tracing.  Confirmed by Kandis Mannan ([id number]) on 08/28/2015 10:29:44 AM      MDM   Final diagnoses:  None    Patietn is a 65 year old female with history of COPD on home 02 presentign with 4 days increasing SOB at home. Patient has been using inhalors as perscribed. Patient's lungs are diminished bilaterally. Occasional wheezes. Anticipate after duonebs, opening patient up and discovering additional wheezing. Patient has home oxygen and is satting at 95% on 2 L. We will get chest x-ray to rule out pneumonia. Otherwise we'll treat for COPD exacerbation with steroids and do nebs.   Patient states that she does not like being on more than 40 mg of prednisone.We will give one dose of methylpred here and then perscribe taper.    11:31 AM After two duonebs patient more tachypnic. Hypoix to 88 % on 3 L. Will consider hour long deb.   12:21 PM Attempted continuous, patietn increasingly tachypnic, will require bipap. Bipap ordered.  Patient more comfortable after 5 minutes of biapp.  Will admit to step down.   CRITICAL  CARE Performed by: Arlana Hove Total critical care time: 60 minutes Critical care time was exclusive of separately billable procedures and treating other patients. Critical care was necessary to treat or prevent imminent or life-threatening deterioration. Critical care was time spent personally by me on the following activities: development of treatment plan with patient and/or surrogate as well as nursing, discussions with consultants, evaluation of patient's response to treatment, examination of patient, obtaining history from patient or surrogate, ordering and performing treatments and interventions, ordering and review of laboratory studies, ordering and review of radiographic studies, pulse oximetry and re-evaluation of patient's condition.   Panfilo Ketchum Randall An, MD 08/28/15 1222  1:45 Patient's gas looks worse. Which is incredibly surprising. Patient looks so much improved. She is comfortable on the bipap, breathing 16/8 at 400 cc tidal, says she feels so much better. If not for the gas, I would consider trialling her off the bipap because she looks so comfortabel.  For that reason we will redraw the gas at 2:20 to get an idea of the direction. Currently she does not want intubation, but I am giogn to have her and her husband reconsider given her young age,  lack of other comorbidities and reversible condition.   3:14 PM Patietn requested to be off bipap. Gas back to origianl gas on arrival. On 2l patietn is 92%, no longer tachpnic. Will giver her bipap break.  She still has mild tachycardia.   Kimon Loewen Randall An, MD 08/28/15 1514

## 2015-08-28 NOTE — ED Notes (Signed)
Patient placed on BiPAP at this time. 12/6, 30%. Stated she already feels more comfortable. CAT continued through mask. RT to monitor as needed

## 2015-08-28 NOTE — Progress Notes (Signed)
Notified MD about troponin .51 and elevated heart rate 105-120 with small burst NSVT patient denies any chest pain at this time I will continue to monitor.

## 2015-08-29 DIAGNOSIS — J9601 Acute respiratory failure with hypoxia: Secondary | ICD-10-CM

## 2015-08-29 LAB — CBC
HCT: 42.2 % (ref 36.0–46.0)
Hemoglobin: 14.3 g/dL (ref 12.0–15.0)
MCH: 30.7 pg (ref 26.0–34.0)
MCHC: 33.9 g/dL (ref 30.0–36.0)
MCV: 90.6 fL (ref 78.0–100.0)
PLATELETS: 147 10*3/uL — AB (ref 150–400)
RBC: 4.66 MIL/uL (ref 3.87–5.11)
RDW: 14 % (ref 11.5–15.5)
WBC: 6.4 10*3/uL (ref 4.0–10.5)

## 2015-08-29 LAB — POCT I-STAT 3, ART BLOOD GAS (G3+)
ACID-BASE EXCESS: 4 mmol/L — AB (ref 0.0–2.0)
Bicarbonate: 35.4 mEq/L — ABNORMAL HIGH (ref 20.0–24.0)
O2 SAT: 90 %
PH ART: 7.24 — AB (ref 7.350–7.450)
Patient temperature: 98.6
TCO2: 38 mmol/L (ref 0–100)
pCO2 arterial: 82.6 mmHg (ref 35.0–45.0)
pO2, Arterial: 73 mmHg — ABNORMAL LOW (ref 80.0–100.0)

## 2015-08-29 LAB — BASIC METABOLIC PANEL
Anion gap: 7 (ref 5–15)
CALCIUM: 8.6 mg/dL — AB (ref 8.9–10.3)
CO2: 34 mmol/L — ABNORMAL HIGH (ref 22–32)
CREATININE: 0.66 mg/dL (ref 0.44–1.00)
Chloride: 87 mmol/L — ABNORMAL LOW (ref 101–111)
Glucose, Bld: 154 mg/dL — ABNORMAL HIGH (ref 65–99)
Potassium: 4.9 mmol/L (ref 3.5–5.1)
SODIUM: 128 mmol/L — AB (ref 135–145)

## 2015-08-29 LAB — TROPONIN I
TROPONIN I: 0.56 ng/mL — AB (ref <0.031)
Troponin I: 0.57 ng/mL (ref <0.031)

## 2015-08-29 NOTE — Progress Notes (Signed)
PT's breathing is more labored, explained to her the need to go back on BiPAP she refused I explained that if she lets this get worse we may not be able to fix pt still refuses BiPap will continue to monitor.

## 2015-08-29 NOTE — Procedures (Signed)
Pt is appears to have some labored breathing explained to patient that she should use the bipap to help. Pt states that it drives her crazy and does not wish to use the bipap. Pt has expiratory wheezes will give a prn treatment and continue to monitor.

## 2015-08-29 NOTE — Progress Notes (Signed)
Pt wanted Bi pap taken off, placed on 2 liters will continue to monitor

## 2015-08-29 NOTE — Progress Notes (Signed)
Pt's husband here. Pt signed AMA paperwork. Husband helped get pt dressed and wheeled down in a wheelchair (from ED) and O2 from home. IV d/c'd.

## 2015-08-29 NOTE — Progress Notes (Signed)
Entered pt's room to assess pt and she stated she was leaving and the doctor was getting some paperwork for her. This RN informed her that the doctor was not discharging her so for her to leave she would have to leave against medical advice. She then stated that she knew that and thinks she would feel better at home than here. Pt stated that the BiPap made her feel worse and that the solu-medrol makes her feel worse and she will feel better at home. This  RN told her that she needs the steroid for her lungs and that she looked as if she was having difficulty breathing and should stay. Pt was adamant that we could not do anything to change her mind and called her husband to come get her.

## 2015-08-29 NOTE — Discharge Summary (Signed)
Physician Discharge Summary  Samantha KeasKathy D Dyer AVW:[id number] DOB: 1951-04-21 DOA: 08/28/2015  PCP: Lenora BoysFRIED, ROBERT L, MD  Admit date: 08/28/2015 Discharge date: 08/29/2015  Time spent: 25 minutes  Recommendations for Outpatient Follow-up:   1. Patient leaving AGAINST MEDICAL ADVICE 2. Despite my best attempts to convince her that she should stay to receive further treatment she insisted on leaving the hospital AGAINST MEDICAL ADVICE stating "I felt much better at home than I do here"  3. She is having a COPD exacerbation, please follow-up on her breathing status.   Discharge Diagnoses:  Principal Problem:   COPD exacerbation (HCC) Active Problems:   Acute respiratory failure (HCC)   TOBACCO ABUSE   Esophageal reflux   Discharge Condition: Leaving AGAINST MEDICAL ADVICE  Diet recommendation:  Filed Weights   08/28/15 1800  Weight: 78.6 kg (173 lb 4.5 oz)    History of present illness:  Samantha Dyer is a 65 y.o. female with a past medical history of tobacco abuse (50 pack year history) chronic obstructive pulmonary disease, presenting as a transfer from [facility] where she initially presented with complaints of cough and shortness of breath. She states feeling sick since 08/22/2015, having progressive shortness of breath associated with cough, wheezing, congestion, chills since then. Shortness of breath becoming worse in the past 24 hours for which she presented to med center. She denies fevers, chills, night sweats, nausea, vomiting, chest pain, abdominal pain. She reports utilizing albuterol at home without having significant relief. [facility] cleared ABG that showed a pH of 7.244 with PCO2 of 77 and PaO2 of 58. She was placed on BiPAP and transferred to stepdown unit at [place]. At baseline she reports using oxygen at nighttime. She is able to perform all activities of daily living.  Hospital Course:  Samantha Dyer is a 65 year old female with  a past medical history of tobacco abuse, chronic obstructive pulmonary disease admitted to medicine service on 08/27/2014, presenting as a transfer from [facility] [place] where she initially presented with complaints of cough and shortness of breath. She was transferred to the [facility]down [facility] to receive further treatment for COPD exacerbation. ABG showing hypoxemic hypercarbic respiratory failure for which BiPAP had been recommended. Overnight she refused BiPAP and IV Solu-Medrol. I spoke to her at length this morning regarding her refusal of treatment. She stated that Solu-Medrol worsened her shortness of breath although, I explained that symptoms may be related to COPD exacerbation rather and we should try systemic steroids. Despite my recommendations Samantha People's stating "I'm going home this morning." I explained to her that she was still quite ill and did not feel she was ready for discharge. By her leaving the hospital she was putting herself in jeopardy.  She replied that she felt better at home than she did in the hospital and was quite adamant about not staying.   She was able to identify this place as [facility] in [place], [place], [place], [id number]. When I asked her what happens when sick patients don't received treatment she stated that "they could die." She is awake and is alert and understands the consequences of her decisions. At this point I feel that she is capable of making her own medical decisions and therefore it would be wrong to force her to stay in the hospital to receive treatment against her will. When I asked why she wanted to leave the hospital she simply  stated that she would feel better at home. I explained to her that she can always come back to the hospital if her symptoms worsened. I also strongly recommended that she see her primary care physician tomorrow morning as soon as possible. She verbalized understanding and reassured me that  she would call her PCP first thing tomorrow morning.   Discharge Exam: Filed Vitals:   08/29/15 0253 08/29/15 0742  BP: 104/77   Pulse: 97   Temp: 97.9 F (36.6 C) 98.2 F (36.8 C)  Resp: 22     General: Patient is awake and alert, sitting at bedside stating I am going home this morning Cardiovascular: Tachycardic, regular rate rhythm normal S1-S2 Respiratory: Decreased breath sounds bilaterally, having bilateral expiratory wheezes Abdomen: Soft nontender nondistended  Discharge Instructions    Current Discharge Medication List    CONTINUE these medications which have NOT CHANGED   Details  Fluticasone Furoate-Vilanterol (BREO ELLIPTA IN) Inhale 1 puff into the lungs daily.    ibuprofen (ADVIL,MOTRIN) 200 MG tablet Take 200 mg by mouth daily as needed (pain).    Ipratropium-Albuterol (COMBIVENT RESPIMAT) 20-100 MCG/ACT AERS respimat Inhale 1 puff into the lungs every 6 (six) hours as needed for wheezing or shortness of breath.    OXYGEN Inhale into the lungs continuous. 2 L    tiotropium (SPIRIVA) 18 MCG inhalation capsule Place 18 mcg into inhaler and inhale daily.       Allergies  Allergen Reactions  . Amoxicillin-Pot Clavulanate Swelling  . Prednisone Other (See Comments)    "drives her crazy"      The results of significant diagnostics from this hospitalization (including imaging, microbiology, ancillary and laboratory) are listed below for reference.    Significant Diagnostic Studies: Dg Chest 2 View  08/28/2015  CLINICAL DATA:  Shortness of breath for 6 days, worsening. Initial encounter. EXAM: CHEST  2 VIEW COMPARISON:  PA and lateral chest 05/07/2014 and 08/17/2012. FINDINGS: There is peribronchial thickening. Heart size is upper normal. No consolidative process, pneumothorax or effusion. Atherosclerosis is noted. No focal bony abnormality. IMPRESSION: Chronic bronchitic change.  No acute abnormality. Electronically Signed   By: Drusilla Kanner M.D.   On:  08/28/2015 11:24    Microbiology: Recent Results (from the past 240 hour(s))  MRSA PCR Screening     Status: None   Collection Time: 08/28/15  5:34 PM  Result Value Ref Range Status   MRSA by PCR NEGATIVE NEGATIVE Final    Comment:        The GeneXpert MRSA Assay (FDA approved for NASAL specimens only), is one component of a comprehensive MRSA colonization surveillance program. It is not intended to diagnose MRSA infection nor to guide or monitor treatment for MRSA infections.      Labs: Basic Metabolic Panel:  Recent Labs Lab 08/28/15 1055 08/28/15 1934 08/29/15 0655  NA 128*  --  128*  K 4.3  --  4.9  CL 88*  --  87*  CO2 36*  --  34*  GLUCOSE 122*  --  154*  BUN 5*  --  <5*  CREATININE 0.64 0.60 0.66  CALCIUM 8.5*  --  8.6*   Liver Function Tests:  Recent Labs Lab 08/28/15 1055  AST 23  ALT 13*  ALKPHOS 97  BILITOT 0.7  PROT 6.7  ALBUMIN 3.7   No results for input(s): LIPASE, AMYLASE in the last 168 hours. No results for input(s): AMMONIA in the last 168 hours. CBC:  Recent Labs Lab 08/28/15 1055  08/28/15 1934 08/29/15 0655  WBC 7.7 6.2 6.4  NEUTROABS 6.4  --   --   HGB 14.5 13.7 14.3  HCT 44.1 42.2 42.2  MCV 90.6 90.2 90.6  PLT 161 127* 147*   Cardiac Enzymes:  Recent Labs Lab 08/28/15 1055 08/28/15 1528 08/28/15 1934 08/29/15 0030 08/29/15 0655  TROPONINI 0.73* 0.56* 0.51* 0.56* 0.57*   BNP: BNP (last 3 results) No results for input(s): BNP in the last 8760 hours.  ProBNP (last 3 results) No results for input(s): PROBNP in the last 8760 hours.  CBG: No results for input(s): GLUCAP in the last 168 hours.     Signed:  Jeralyn Bennett MD.  [facility] 08/29/2015, 9:26 AM

## 2015-08-29 NOTE — Progress Notes (Signed)
Pt's grandson called and wanted to know why the pt was leaving AMA. Informed him of everything she said (see previous note) and he suggested we offer her something to calm her down thinking it was just anxiety. Offered to get something for pt from the MD and she refused.

## 2015-09-05 ENCOUNTER — Inpatient Hospital Stay (HOSPITAL_COMMUNITY)
Admission: EM | Admit: 2015-09-05 | Discharge: 2015-09-07 | DRG: 190 | Disposition: A | Discharge status: Home / Self Care | Payer: 59 | Attending: Internal Medicine | Admitting: Internal Medicine

## 2015-09-05 ENCOUNTER — Emergency Department (HOSPITAL_COMMUNITY): Payer: 59

## 2015-09-05 ENCOUNTER — Encounter (HOSPITAL_COMMUNITY): Payer: Self-pay | Admitting: *Deleted

## 2015-09-05 DIAGNOSIS — Z7952 Long term (current) use of systemic steroids: Secondary | ICD-10-CM

## 2015-09-05 DIAGNOSIS — Z825 Family history of asthma and other chronic lower respiratory diseases: Secondary | ICD-10-CM

## 2015-09-05 DIAGNOSIS — J9621 Acute and chronic respiratory failure with hypoxia: Secondary | ICD-10-CM | POA: Diagnosis present

## 2015-09-05 DIAGNOSIS — J441 Chronic obstructive pulmonary disease with (acute) exacerbation: Secondary | ICD-10-CM | POA: Diagnosis present

## 2015-09-05 DIAGNOSIS — J9601 Acute respiratory failure with hypoxia: Secondary | ICD-10-CM

## 2015-09-05 DIAGNOSIS — J9602 Acute respiratory failure with hypercapnia: Secondary | ICD-10-CM | POA: Diagnosis present

## 2015-09-05 DIAGNOSIS — R600 Localized edema: Secondary | ICD-10-CM | POA: Diagnosis present

## 2015-09-05 DIAGNOSIS — J9622 Acute and chronic respiratory failure with hypercapnia: Secondary | ICD-10-CM | POA: Diagnosis present

## 2015-09-05 DIAGNOSIS — E872 Acidosis: Secondary | ICD-10-CM | POA: Diagnosis present

## 2015-09-05 DIAGNOSIS — Z8249 Family history of ischemic heart disease and other diseases of the circulatory system: Secondary | ICD-10-CM | POA: Diagnosis not present

## 2015-09-05 DIAGNOSIS — R0600 Dyspnea, unspecified: Secondary | ICD-10-CM

## 2015-09-05 DIAGNOSIS — F17210 Nicotine dependence, cigarettes, uncomplicated: Secondary | ICD-10-CM | POA: Diagnosis present

## 2015-09-05 DIAGNOSIS — K219 Gastro-esophageal reflux disease without esophagitis: Secondary | ICD-10-CM | POA: Diagnosis present

## 2015-09-05 DIAGNOSIS — D509 Iron deficiency anemia, unspecified: Secondary | ICD-10-CM | POA: Diagnosis present

## 2015-09-05 DIAGNOSIS — I872 Venous insufficiency (chronic) (peripheral): Secondary | ICD-10-CM | POA: Diagnosis present

## 2015-09-05 DIAGNOSIS — I517 Cardiomegaly: Secondary | ICD-10-CM | POA: Diagnosis not present

## 2015-09-05 DIAGNOSIS — Z720 Tobacco use: Secondary | ICD-10-CM | POA: Diagnosis not present

## 2015-09-05 LAB — BLOOD GAS, ARTERIAL
ACID-BASE EXCESS: 10.7 mmol/L — AB (ref 0.0–2.0)
Acid-Base Excess: 12.3 mmol/L — ABNORMAL HIGH (ref 0.0–2.0)
Bicarbonate: 42 mEq/L — ABNORMAL HIGH (ref 20.0–24.0)
Bicarbonate: 41.7 mEq/L — ABNORMAL HIGH (ref 20.0–24.0)
Drawn by: 307971
Drawn by: 307971
EXPIRATORY PAP: 5
EXPIRATORY PAP: 5
FIO2: 0.4
FIO2: 0.4
MODE: POSITIVE
O2 SAT: 94.1 %
O2 Saturation: 95 %
PATIENT TEMPERATURE: 98.6
PATIENT TEMPERATURE: 98.6
PEEP: 5 cmH2O
PEEP: 5 cmH2O
PH ART: 7.325 — AB (ref 7.350–7.450)
PRESSURE SUPPORT: 15 cmH2O
PRESSURE SUPPORT: 0 cmH2O
Pressure control: 14 cmH2O
RATE: 15 resp/min
RATE: 18 resp/min
TCO2: 38.4 mmol/L (ref 0–100)
TCO2: 37.8 mmol/L (ref 0–100)
pCO2 arterial: 97.4 mmHg (ref 35.0–45.0)
pCO2 arterial: 82.2 mmHg (ref 35.0–45.0)
pH, Arterial: 7.258 — ABNORMAL LOW (ref 7.350–7.450)
pO2, Arterial: 79.3 mmHg — ABNORMAL LOW (ref 80.0–100.0)
pO2, Arterial: 78 mmHg — ABNORMAL LOW (ref 80.0–100.0)

## 2015-09-05 LAB — COMPREHENSIVE METABOLIC PANEL
ALBUMIN: 4 g/dL (ref 3.5–5.0)
ALT: 23 U/L (ref 14–54)
ANION GAP: 8 (ref 5–15)
AST: 25 U/L (ref 15–41)
Alkaline Phosphatase: 78 U/L (ref 38–126)
BILIRUBIN TOTAL: 1.1 mg/dL (ref 0.3–1.2)
BUN: <5 mg/dL — ABNORMAL LOW (ref 6–20)
CO2: 41 mmol/L — ABNORMAL HIGH (ref 22–32)
Calcium: 8.9 mg/dL (ref 8.9–10.3)
Chloride: 85 mmol/L — ABNORMAL LOW (ref 101–111)
Creatinine, Ser: 0.65 mg/dL (ref 0.44–1.00)
GFR calc Af Amer: >60 mL/min (ref >60)
Glucose, Bld: 148 mg/dL — ABNORMAL HIGH (ref 65–99)
POTASSIUM: 4.2 mmol/L (ref 3.5–5.1)
Sodium: 134 mmol/L — ABNORMAL LOW (ref 135–145)
TOTAL PROTEIN: 7.1 g/dL (ref 6.5–8.1)

## 2015-09-05 LAB — CBC WITH DIFFERENTIAL/PLATELET
Basophils Absolute: 0 10*3/uL (ref 0.0–0.1)
Basophils Relative: 0 %
EOS PCT: 1 %
Eosinophils Absolute: 0.2 10*3/uL (ref 0.0–0.7)
HCT: 47.3 % — ABNORMAL HIGH (ref 36.0–46.0)
HEMOGLOBIN: 14.8 g/dL (ref 12.0–15.0)
LYMPHS ABS: 1.9 10*3/uL (ref 0.7–4.0)
LYMPHS PCT: 11 %
MCH: 29.7 pg (ref 26.0–34.0)
MCHC: 31.3 g/dL (ref 30.0–36.0)
MCV: 95 fL (ref 78.0–100.0)
MONOS PCT: 12 %
Monocytes Absolute: 2.2 10*3/uL — ABNORMAL HIGH (ref 0.1–1.0)
Neutro Abs: 13.6 10*3/uL — ABNORMAL HIGH (ref 1.7–7.7)
Neutrophils Relative %: 76 %
PLATELETS: 309 10*3/uL (ref 150–400)
RBC: 4.98 MIL/uL (ref 3.87–5.11)
RDW: 14 % (ref 11.5–15.5)
WBC: 17.9 10*3/uL — AB (ref 4.0–10.5)

## 2015-09-05 LAB — I-STAT CG4 LACTIC ACID, ED
LACTIC ACID, VENOUS: 0.63 mmol/L (ref 0.5–2.0)
Lactic Acid, Venous: 1.69 mmol/L (ref 0.5–2.0)

## 2015-09-05 LAB — I-STAT TROPONIN, ED: TROPONIN I, POC: 0.05 ng/mL (ref 0.00–0.08)

## 2015-09-05 LAB — D-DIMER, QUANTITATIVE (NOT AT ARMC): D DIMER QUANT: 0.77 ug{FEU}/mL — AB (ref 0.00–0.50)

## 2015-09-05 LAB — BRAIN NATRIURETIC PEPTIDE: B Natriuretic Peptide: 869.2 pg/mL — ABNORMAL HIGH (ref 0.0–100.0)

## 2015-09-05 MED ORDER — IPRATROPIUM-ALBUTEROL 0.5-2.5 (3) MG/3ML IN SOLN: 3.0000 mL | Freq: Four times a day (QID) | RESPIRATORY_TRACT | Status: DC

## 2015-09-05 MED ORDER — ALBUTEROL (5 MG/ML) CONTINUOUS INHALATION SOLN
20.0000 mg/h | INHALATION_SOLUTION | Freq: Once | RESPIRATORY_TRACT | Status: AC
  Administered 2015-09-05: 20 mg/h via RESPIRATORY_TRACT

## 2015-09-05 MED ORDER — ALBUTEROL (5 MG/ML) CONTINUOUS INHALATION SOLN
20.0000 mg/h | INHALATION_SOLUTION | RESPIRATORY_TRACT | Status: DC
  Administered 2015-09-05: 20 mg/h via RESPIRATORY_TRACT
  Filled 2015-09-05: qty 20

## 2015-09-05 MED ORDER — IOHEXOL 350 MG/ML SOLN
100.0000 mL | Freq: Once | INTRAVENOUS | Status: AC | PRN
  Administered 2015-09-05: 100 mL via INTRAVENOUS

## 2015-09-05 MED ORDER — FUROSEMIDE 10 MG/ML IJ SOLN: 20.0000 mg | Freq: Once | INTRAMUSCULAR | Status: DC

## 2015-09-05 MED ORDER — IPRATROPIUM BROMIDE 0.02 % IN SOLN
0.5000 mg | Freq: Once | RESPIRATORY_TRACT | Status: AC
  Administered 2015-09-05: 0.5 mg via RESPIRATORY_TRACT

## 2015-09-05 MED ORDER — FUROSEMIDE 10 MG/ML IJ SOLN
20.0000 mg | Freq: Once | INTRAMUSCULAR | Status: AC
  Administered 2015-09-05: 20 mg via INTRAVENOUS
  Filled 2015-09-05: qty 2

## 2015-09-05 MED ORDER — ACETAMINOPHEN 650 MG RE SUPP: 650.0000 mg | Freq: Four times a day (QID) | RECTAL | Status: DC | PRN

## 2015-09-05 MED ORDER — POLYETHYLENE GLYCOL 3350 17 G PO PACK: 17.0000 g | PACK | Freq: Every day | ORAL | Status: DC | PRN

## 2015-09-05 MED ORDER — LEVOFLOXACIN IN D5W 750 MG/150ML IV SOLN
750.0000 mg | Freq: Once | INTRAVENOUS | Status: AC
  Administered 2015-09-05: 750 mg via INTRAVENOUS
  Filled 2015-09-05: qty 150

## 2015-09-05 MED ORDER — ENOXAPARIN SODIUM 40 MG/0.4ML ~~LOC~~ SOLN
40.0000 mg | SUBCUTANEOUS | Status: DC
Start: 2015-09-05 — End: 2015-09-07
  Administered 2015-09-05 – 2015-09-06 (×2): 40 mg via SUBCUTANEOUS
  Filled 2015-09-05 (×2): qty 0.4

## 2015-09-05 MED ORDER — METHYLPREDNISOLONE SODIUM SUCC 125 MG IJ SOLR
60.0000 mg | Freq: Every day | INTRAMUSCULAR | Status: DC
  Administered 2015-09-05 – 2015-09-07 (×3): 60 mg via INTRAVENOUS
  Filled 2015-09-05: qty 0.96
  Filled 2015-09-05 (×2): qty 2

## 2015-09-05 MED ORDER — ONDANSETRON HCL 4 MG PO TABS: 4.0000 mg | ORAL_TABLET | Freq: Four times a day (QID) | ORAL | Status: DC | PRN

## 2015-09-05 MED ORDER — LEVOFLOXACIN IN D5W 750 MG/150ML IV SOLN
750.0000 mg | INTRAVENOUS | Status: DC
  Administered 2015-09-06: 750 mg via INTRAVENOUS
  Filled 2015-09-05 (×2): qty 150

## 2015-09-05 MED ORDER — ACETAMINOPHEN 325 MG PO TABS: 650.0000 mg | ORAL_TABLET | Freq: Four times a day (QID) | ORAL | Status: DC | PRN

## 2015-09-05 MED ORDER — ONDANSETRON HCL 4 MG/2ML IJ SOLN: 4.0000 mg | Freq: Four times a day (QID) | INTRAMUSCULAR | Status: DC | PRN

## 2015-09-05 MED ORDER — IPRATROPIUM-ALBUTEROL 0.5-2.5 (3) MG/3ML IN SOLN
3.0000 mL | RESPIRATORY_TRACT | Status: DC
  Administered 2015-09-05: 3 mL via RESPIRATORY_TRACT
  Filled 2015-09-05: qty 3

## 2015-09-05 MED ORDER — SODIUM CHLORIDE 0.9 % IJ SOLN
3.0000 mL | Freq: Two times a day (BID) | INTRAMUSCULAR | Status: DC
  Administered 2015-09-05 – 2015-09-07 (×4): 3 mL via INTRAVENOUS

## 2015-09-05 NOTE — ED Provider Notes (Signed)
CSN: [id number]     Arrival date & time 09/05/15  0846 History   First MD Initiated Contact with Patient 09/05/15 0901     Chief Complaint  Patient presents with  . Respiratory Distress     (Consider location/radiation/quality/duration/timing/severity/associated sxs/prior Treatment) HPI 65 year old female who presents with respiratory distress. History of COPD, on 2 L nasal cannula when necessary. Recently left the hospital AGAINST MEDICAL ADVICE on 08/22/2015 for treatment of COPD exacerbation. States that she currently on antibiotics for presumed pneumonia, and over the past 5 days has had increasing shortness of breath, cough, wheezing. Has not had any fevers or chills. Denies any chest pain. Has noted that she has had swelling in her left lower extremity over the past 4 days. No pain. No orthopnea or PND. No prior history of PE or DVT and no known family history of thromboembolic disease.   Past Medical History  Diagnosis Date  . COPD (chronic obstructive pulmonary disease) (HCC)   . Esophageal reflux   . Anemia, unspecified   . Allergic rhinitis, cause unspecified   . Iron deficiency anemia, unspecified   . Backache, unspecified    History reviewed. No pertinent past surgical history. Family History  Problem Relation Age of Onset  . Emphysema Mother   . Heart disease Mother   . Breast cancer Mother   . Pancreatic cancer Father    Social History  Substance Use Topics  . Smoking status: Current Every Day Smoker -- 1.00 packs/day for 43 years    Types: Cigarettes  . Smokeless tobacco: Never Used  . Alcohol Use: No   OB History    No data available     Review of Systems 10/14 systems reviewed and are negative other than those stated in the HPI    Allergies  Amoxicillin-pot clavulanate and Prednisone  Home Medications   Prior to Admission medications   Medication Sig Start Date End Date Taking? Authorizing Provider  azithromycin (ZITHROMAX) 250 MG tablet Take  250-500 mg by mouth daily. Takes 500 mg first dose on 09/02/15 then take 250 mg for four days till complete 09/02/15  Yes Historical Provider, MD  BREO ELLIPTA 200-25 MCG/INH AEPB Inhale 1 puff into the lungs daily.  08/13/15  Yes Historical Provider, MD  dexamethasone (DECADRON) 2 MG tablet Take 2 mg by mouth daily.  09/02/15  Yes Historical Provider, MD  ibuprofen (ADVIL,MOTRIN) 200 MG tablet Take 200 mg by mouth every 6 (six) hours as needed for moderate pain.    Yes Historical Provider, MD  ipratropium-albuterol (DUONEB) 0.5-2.5 (3) MG/3ML SOLN Inhale 3 mLs into the lungs every 4 (four) hours as needed (Shortness of breath).  09/02/15  Yes Historical Provider, MD  OXYGEN Inhale into the lungs continuous. 2 L   Yes Historical Provider, MD  tiotropium (SPIRIVA) 18 MCG inhalation capsule Place 18 mcg into inhaler and inhale daily.   Yes Historical Provider, MD   BP 133/102 mmHg  Pulse 118  Temp(Src) 98.4 F (36.9 C) (Oral)  Resp 29  SpO2 93% Physical Exam Physical Exam  Nursing note and vitals reviewed. Constitutional:  Head: Normocephalic and atraumatic.  Mouth/Throat: Oropharynx is clear and moist.  Neck: Normal range of motion. Neck supple.  Cardiovascular: Tachycardic rate and regular rhythm.  LLE +1 edema  Pulmonary/Chest:  on CPAP on arrival, tripoding, unable to speak in sentences, accessory muscle usage, minimal air movement diffusely with occasional small wheeze Abddominal: Soft. There is no tenderness. There is no rebound and no guarding.  Musculoskeletal: No deformities. Neurological: Alert, no facial droop, moves all extremities symmetrically Skin: Skin is warm and dry.  Psychiatric: Cooperative  ED Course  Procedures (including critical care time) Labs Review Labs Reviewed  BRAIN NATRIURETIC PEPTIDE - Abnormal; Notable for the following:    B Natriuretic Peptide 869.2 (*)    All other components within normal limits  CBC WITH DIFFERENTIAL/PLATELET - Abnormal; Notable for  the following:    WBC 17.9 (*)    HCT 47.3 (*)    Neutro Abs 13.6 (*)    Monocytes Absolute 2.2 (*)    All other components within normal limits  COMPREHENSIVE METABOLIC PANEL - Abnormal; Notable for the following:    Sodium 134 (*)    Chloride 85 (*)    CO2 41 (*)    Glucose, Bld 148 (*)    BUN <5 (*)    All other components within normal limits  BLOOD GAS, ARTERIAL - Abnormal; Notable for the following:    pH, Arterial 7.258 (*)    pCO2 arterial 97.4 (*)    pO2, Arterial 79.3 (*)    Bicarbonate 42.0 (*)    Acid-Base Excess 10.7 (*)    All other components within normal limits  BLOOD GAS, ARTERIAL - Abnormal; Notable for the following:    pH, Arterial 7.325 (*)    pCO2 arterial 82.2 (*)    pO2, Arterial 78.0 (*)    Bicarbonate 41.7 (*)    Acid-Base Excess 12.3 (*)    All other components within normal limits  D-DIMER, QUANTITATIVE (NOT AT [facility]) - Abnormal; Notable for the following:    D-Dimer, Quant 0.77 (*)    All other components within normal limits  I-STAT CG4 LACTIC ACID, ED  I-STAT TROPOININ, ED  I-STAT CG4 LACTIC ACID, ED    Imaging Review Ct Angio Chest Pe W/cm &/or Wo Cm  09/05/2015  CLINICAL DATA:  Respiratory distress beginning a few days ago when she was diagnosed with pneumonia, worsened today, lower extremity swelling for 4 days, leukocytosis, COPD, D-dimer = 0.77 EXAM: CT ANGIOGRAPHY CHEST WITH CONTRAST TECHNIQUE: Multidetector CT imaging of the chest was performed using the standard protocol during bolus administration of intravenous contrast. Multiplanar CT image reconstructions and MIPs were obtained to evaluate the vascular anatomy. CONTRAST:  OMNIPAQUE IOHEXOL 350 MG/ML SOLN IV COMPARISON:  None FINDINGS: Scattered atherosclerotic calcifications aorta, coronary arteries and proximal great vessels. Aorta normal caliber without aneurysm or dissection. No thoracic adenopathy. Pulmonary arteries well opacified and patent. No evidence of pulmonary embolism.  Visualized upper abdomen unremarkable. Small RIGHT pleural effusion. Minimal compressive atelectasis RIGHT lower lobe with subsegmental atelectasis at base of lingula. Mild central peribronchial thickening. No acute pulmonary infiltrate, pleural effusion, or pneumothorax. Osseous structures unremarkable. Review of the MIP images confirms the above findings. IMPRESSION: No evidence of pulmonary embolism. Significant atherosclerotic disease. Small RIGHT pleural effusion with minimal scattered atelectasis and central peribronchial thickening. Electronically Signed   By: Ulyses Southward M.D.   On: 09/05/2015 13:46   Dg Chest Portable 1 View  09/05/2015  CLINICAL DATA:  Shortness of breath today. EXAM: PORTABLE CHEST 1 VIEW COMPARISON:  08/28/2015 FINDINGS: Lungs are adequately inflated and demonstrate prominence of the perihilar markings suggesting vascular congestion. Minimal blunting of the right costophrenic angle as possible small amount right pleural fluid. Borderline stable cardiomegaly. Remainder of the exam is unchanged. IMPRESSION: Borderline stable cardiomegaly with findings suggesting mild vascular congestion. Electronically Signed   By: Elberta Fortis M.D.   On:  09/05/2015 09:59   I have personally reviewed and evaluated these images and lab results as part of my medical decision-making.   EKG Interpretation   Date/Time:  Sunday September 05 2015 08:51:10 EST Ventricular Rate:  113 PR Interval:  153 QRS Duration: 90 QT Interval:  309 QTC Calculation: 424 R Axis:   64 Text Interpretation:  Sinus tachycardia Right atrial enlargement  Borderline ST depression, diffuse leads Baseline wander in lead(s) V6  Confirmed by Philomena Buttermore MD, Crystal Ellwood [phone]) on 09/05/2015 9:12:20 AM      CRITICAL CARE Performed by: Lavera Guise   Total critical care time: 35 minutes  Critical care time was exclusive of separately billable procedures and treating other patients.  Critical care was necessary to treat or prevent  imminent or life-threatening deterioration.  Critical care was time spent personally by me on the following activities: development of treatment plan with patient and/or surrogate as well as nursing, discussions with consultants, evaluation of patient's response to treatment, examination of patient, obtaining history from patient or surrogate, ordering and performing treatments and interventions, ordering and review of laboratory studies, ordering and review of radiographic studies, pulse oximetry and re-evaluation of patient's condition.   MDM   Final diagnoses:  Dyspnea  COPD exacerbation (HCC)  Acute respiratory failure with hypoxia and hypercapnia (HCC)    65 year old female with history of COPD who presents with respiratory distress. On arrival, she is on CPAP. She appears to be in severe respiratory distress, is tripoding, only nodding or shaking her head to speak, and with minimal air movement on lung exam. Does have isolated left lower extremity edema, but no prior history of CHF or other CHF symptoms. No major PE risk factors either aside from recent hospitalization with possible immbolization.    is placed on BiPAP, and started on continuous albuterol. Had previously just received 125 mg of Solu-Medrol. She is also empirically covered with Levaquin. A chest x-ray showing no significant pulmonary edema, but minimal pulmonary vascular congestion. No infiltrate is noted. D-dimer is positive, and she will undergo CT PE. CT negative for PE, PNA, edema or other acute intrathoracic processes. BNP elevated, but no evidence of frank fluid overload causing problem.   Initial ABG showing severe hypercapnea with acute respiratory acidosis.  is placed on a second hour of continuous albuterol given that she continues to have poor air movement.  does show improved work of breathing, is able to speak in full sentences, and without accessory muscle usage.   Discussed with Dr. Link Snuffer who will admit to  [facility] for [facility] care.  Lavera Guise, MD 09/05/15 1540

## 2015-09-05 NOTE — H&P (Signed)
[facility]  History and Physical Brodie Scovell L. Link SnufferHolwerda, MD Pager [phone] (if 7P to 7A, page night hospitalist on [email or web address].[email or web address])  Wessie D Battiste JYN:[id number] DOB: [birth year] DOA: 09/05/2015  Referring physician: ED  PCP: Lenora BoysFRIED, ROBERT L, MD   Chief Complaint: dyspnea   HPI:  Pt left [facility] on 1/1 for COPD exacerbation. She was at home being treated for PNA w/ azithromycin when her dyspnea progressed and she called EMS today. She was given 125mg  solumedrol by paramedics and transported to [facility]. In ED she underwent CTA b/c she was complaining of edema in LLE and was noted to have elevated D Dimer on lab work.  CTA is underway at this time, results not available . Her CXR showed vascular congestion and mild edema on R which is new from prior and borderline cardiomegaly that is unchanged. She is remaining controlled on BiPAP after ABG showed hypercarbic , hypoxic respiratory failure . She also has WBC of 18 .   Family at bedside stated she was on azithromycin day 5 treating an outpatient diagnosis of PNA and she was doing fine , chronically on 2L [place] at home , when she went to bathroom on her own and yelled for EMS b/c of acute decline in breathing status.   In the ED the respiratory therapist perform ABG that showed improvement on BiPAP of pH from 7.2 to 7.35 . We agreed changing to O2 nasal cannula was appropriate at this time   Chart Review:  ED notes, labs, images. Prior DC summary   Review of Systems:  Patient has dyspnea   Past Medical History  Diagnosis Date  . COPD (chronic obstructive pulmonary disease) (HCC)   . Esophageal reflux   . Anemia, unspecified   . Allergic rhinitis, cause unspecified   . Iron deficiency anemia, unspecified   . Backache, unspecified     History reviewed. No pertinent past surgical history.  Social History:  reports that she has been smoking Cigarettes.  She has a 43 pack-year smoking history. She has never used smokeless tobacco. She reports  that she does not drink alcohol. Her drug history is not on file.  Allergies  Allergen Reactions  . Amoxicillin-Pot Clavulanate Swelling  . Prednisone Other (See Comments)    "drives her crazy"    Family History  Problem Relation Age of Onset  . Emphysema Mother   . Heart disease Mother   . Breast cancer Mother   . Pancreatic cancer Father      Prior to Admission medications   Medication Sig Start Date End Date Taking? Authorizing Provider  azithromycin (ZITHROMAX) 250 MG tablet Take 250-500 mg by mouth daily. Takes 500 mg first dose on 09/02/15 then take 250 mg for four days till complete 09/02/15  Yes Historical Provider, MD  BREO ELLIPTA 200-25 MCG/INH AEPB Inhale 1 puff into the lungs daily.  08/13/15  Yes Historical Provider, MD  dexamethasone (DECADRON) 2 MG tablet Take 2 mg by mouth daily.  09/02/15  Yes Historical Provider, MD  ibuprofen (ADVIL,MOTRIN) 200 MG tablet Take 200 mg by mouth every 6 (six) hours as needed for moderate pain.    Yes Historical Provider, MD  ipratropium-albuterol (DUONEB) 0.5-2.5 (3) MG/3ML SOLN Inhale 3 mLs into the lungs every 4 (four) hours as needed (Shortness of breath).  09/02/15  Yes Historical Provider, MD  OXYGEN Inhale into the lungs continuous. 2 L   Yes Historical Provider, MD  tiotropium (SPIRIVA) 18 MCG inhalation capsule Place 18 mcg into inhaler  and inhale daily.   Yes Historical Provider, MD   Physical Exam: Filed Vitals:   09/05/15 1015 09/05/15 1030 09/05/15 1045 09/05/15 1100  BP: 145/65 136/58 136/57 135/59  Pulse: 102 95 99 100  TempSrc:      Resp: 15 13 14 20   SpO2: 98% 98% 98% 98%     General:  WF on BiPAP   HEENT: MMM, anicteric   Cardiovascular: RRR, no MRG . 1+ pitting edema of LLE   Respiratory: air movement in all fields. Wheezing present on exp/insp phases   Abdomen: soft, NT   Skin: dry, warm   Musculoskeletal: no focal deficits   Psychiatric: no anxiety or depression  Neurologic: no focal deficits    Wt Readings from Last 3 Encounters:  08/28/15 78.6 kg (173 lb 4.5 oz)  05/07/14 74.571 kg (164 lb 6.4 oz)  11/20/13 76.712 kg (169 lb 1.9 oz)    Labs on Admission:  Basic Metabolic Panel:  Recent Labs Lab 09/05/15 0855  NA 134*  K 4.2  CL 85*  CO2 41*  GLUCOSE 148*  BUN <5*  CREATININE 0.65  CALCIUM 8.9   Liver Function Tests:  Recent Labs Lab 09/05/15 0855  AST 25  ALT 23  ALKPHOS 78  BILITOT 1.1  PROT 7.1  ALBUMIN 4.0   No results for input(s): LIPASE, AMYLASE in the last 168 hours. No results for input(s): AMMONIA in the last 168 hours. CBC:  Recent Labs Lab 09/05/15 0855  WBC 17.9*  NEUTROABS 13.6*  HGB 14.8  HCT 47.3*  MCV 95.0  PLT 309   Cardiac Enzymes: No results for input(s): CKTOTAL, CKMB, CKMBINDEX, TROPONINI in the last 168 hours.  BNP (last 3 results)  Recent Labs  09/05/15 0855  BNP 869.2*    ProBNP (last 3 results) No results for input(s): PROBNP in the last 8760 hours.  CBG: No results for input(s): GLUCAP in the last 168 hours.  Radiological Exams on Admission: Dg Chest Portable 1 View  09/05/2015  CLINICAL DATA:  Shortness of breath today. EXAM: PORTABLE CHEST 1 VIEW COMPARISON:  08/28/2015 FINDINGS: Lungs are adequately inflated and demonstrate prominence of the perihilar markings suggesting vascular congestion. Minimal blunting of the right costophrenic angle as possible small amount right pleural fluid. Borderline stable cardiomegaly. Remainder of the exam is unchanged. IMPRESSION: Borderline stable cardiomegaly with findings suggesting mild vascular congestion. Electronically Signed   By: Elberta Fortis M.D.   On: 09/05/2015 09:59    EKG: Independently reviewed.   Active Problems:   Acute respiratory failure with hypoxia and hypercarbia (HCC)   Assessment/Plan 1. COPD exacerbation - duonebs scheduled, IV solumedrol 60mg  daily, O2 , treat w/ levaquin for broad coverage given recent hospital stay, admit to SDU .   2. Pleural edema - lasix 20mg  IV OTO tonight . New edema since last admission  3. Leg edema - CTA is pending . Will anticoagulate accordingly if CTA shows presence of PE    Code Status: full Family Communication: none Disposition Plan/Anticipated LOS: 3-5 days   Time spent: 45 minutes  Alysia Penna, MD  Internal Medicine Pager [phone] Cell [phone] If 7PM-7AM, please contact night-coverage at [email or web address], password [facility] 09/05/2015, 12:17 PM

## 2015-09-05 NOTE — ED Notes (Signed)
Patient has a recent diagnosis of pneumonia and was being treated at home with antibiotics. Patient does not feel that she is improving and was brought in today for respiratory distress.

## 2015-09-05 NOTE — ED Notes (Signed)
Pt from home via EMS- Per EMS pt c/o resp distress that started a few days ago when she dx with PNA, worsened today. Pt reports that she has been taking abx. Pt arrived alert but on CPAP with EMS. EDP at bedside

## 2015-09-06 ENCOUNTER — Inpatient Hospital Stay (HOSPITAL_COMMUNITY): Payer: 59

## 2015-09-06 DIAGNOSIS — R0600 Dyspnea, unspecified: Secondary | ICD-10-CM

## 2015-09-06 DIAGNOSIS — J441 Chronic obstructive pulmonary disease with (acute) exacerbation: Principal | ICD-10-CM

## 2015-09-06 DIAGNOSIS — J9622 Acute and chronic respiratory failure with hypercapnia: Secondary | ICD-10-CM

## 2015-09-06 DIAGNOSIS — Z720 Tobacco use: Secondary | ICD-10-CM

## 2015-09-06 LAB — BASIC METABOLIC PANEL
Anion gap: 8 (ref 5–15)
BUN: 6 mg/dL (ref 6–20)
CALCIUM: 9 mg/dL (ref 8.9–10.3)
CO2: 45 mmol/L — ABNORMAL HIGH (ref 22–32)
Chloride: 82 mmol/L — ABNORMAL LOW (ref 101–111)
Creatinine, Ser: 0.55 mg/dL (ref 0.44–1.00)
GFR calc Af Amer: >60 mL/min (ref >60)
Glucose, Bld: 139 mg/dL — ABNORMAL HIGH (ref 65–99)
POTASSIUM: 4.4 mmol/L (ref 3.5–5.1)
SODIUM: 135 mmol/L (ref 135–145)

## 2015-09-06 LAB — CBC
HEMATOCRIT: 42.2 % (ref 36.0–46.0)
Hemoglobin: 13.9 g/dL (ref 12.0–15.0)
MCH: 30.5 pg (ref 26.0–34.0)
MCHC: 32.9 g/dL (ref 30.0–36.0)
MCV: 92.7 fL (ref 78.0–100.0)
PLATELETS: 189 10*3/uL (ref 150–400)
RBC: 4.55 MIL/uL (ref 3.87–5.11)
RDW: 13.7 % (ref 11.5–15.5)
WBC: 8.7 10*3/uL (ref 4.0–10.5)

## 2015-09-06 LAB — MRSA PCR SCREENING: MRSA BY PCR: NEGATIVE

## 2015-09-06 MED ORDER — IPRATROPIUM BROMIDE 0.02 % IN SOLN
0.5000 mg | RESPIRATORY_TRACT | Status: DC
  Administered 2015-09-06 (×4): 0.5 mg via RESPIRATORY_TRACT
  Filled 2015-09-06 (×4): qty 2.5

## 2015-09-06 MED ORDER — LEVALBUTEROL HCL 0.63 MG/3ML IN NEBU
0.6300 mg | INHALATION_SOLUTION | RESPIRATORY_TRACT | Status: DC | PRN
  Filled 2015-09-06: qty 3

## 2015-09-06 MED ORDER — LEVALBUTEROL HCL 0.63 MG/3ML IN NEBU
0.6300 mg | INHALATION_SOLUTION | RESPIRATORY_TRACT | Status: DC
  Administered 2015-09-06 (×4): 0.63 mg via RESPIRATORY_TRACT
  Filled 2015-09-06 (×4): qty 3

## 2015-09-06 MED ORDER — LEVALBUTEROL HCL 0.63 MG/3ML IN NEBU
0.6300 mg | INHALATION_SOLUTION | Freq: Four times a day (QID) | RESPIRATORY_TRACT | Status: DC
  Administered 2015-09-07 (×2): 0.63 mg via RESPIRATORY_TRACT
  Filled 2015-09-06 (×2): qty 3

## 2015-09-06 MED ORDER — SODIUM CHLORIDE 0.9 % IV SOLN: INTRAVENOUS | Status: DC

## 2015-09-06 MED ORDER — IPRATROPIUM BROMIDE 0.02 % IN SOLN
0.5000 mg | Freq: Four times a day (QID) | RESPIRATORY_TRACT | Status: DC
  Administered 2015-09-07 (×2): 0.5 mg via RESPIRATORY_TRACT
  Filled 2015-09-06 (×2): qty 2.5

## 2015-09-06 NOTE — Progress Notes (Signed)
[facility] PROGRESS NOTE  Samantha Dyer AVW:[id number] DOB: [birth year] DOA: 09/05/2015  PCP: Lenora Boys, MD  Brief HPI: 65 year old Caucasian female with the past history of COPD who continues to smoke cigarettes. She was recently treated for pneumonia with azithromycin. She presented to [facility] on January 1 for COPD exacerbation, but left AGAINST MEDICAL ADVICE. She presented to this hospital with complaints of shortness of breath. She was placed on BiPAP. She started improving. She was hospitalized for further management.  Past medical history:  Past Medical History  Diagnosis Date  . COPD (chronic obstructive pulmonary disease) (HCC)   . Esophageal reflux   . Anemia, unspecified   . Allergic rhinitis, cause unspecified   . Iron deficiency anemia, unspecified   . Backache, unspecified     Consultants: None  Procedures: None  Antibiotics: Levaquin  Subjective: Patient feels better this morning. Breathing is improved. Continues to have a dry cough. Denies any chest pain. No nausea or vomiting. Sitting on the chair.  Objective: Vital Signs  Filed Vitals:   09/06/15 0540 09/06/15 0800 09/06/15 0805 09/06/15 1200  BP: 97/74  106/80   Pulse:   116   Temp:  98.1 F (36.7 C)  98.3 F (36.8 C)  TempSrc:  Oral  Oral  Resp:   22   Height:      Weight:      SpO2:   94%     Intake/Output Summary (Last 24 hours) at 09/06/15 1401 Last data filed at 09/06/15 0700  Gross per 24 hour  Intake    710 ml  Output   1625 ml  Net   -915 ml   Filed Weights   09/05/15 2100  Weight: 76.6 kg (168 lb 14 oz)    General appearance: alert, cooperative, appears stated age and no distress Resp: Few scattered wheezes bilaterally. No crackles. No rhonchi. Diminished air entry. Cardio: S1, S2 is tachycardic. Regular. No S3, S4. No rubs, murmurs or bruit. Some pedal edema is noted. GI: soft, non-tender; bowel sounds normal; no masses,  no  organomegaly Extremities: edema Minimal to 1+ Neurologic: Awake and alert. Oriented 3. No focal neurological deficits.  Lab Results:  Basic Metabolic Panel:  Recent Labs Lab 09/05/15 0855 09/06/15 0532  NA 134* 135  K 4.2 4.4  CL 85* 82*  CO2 41* 45*  GLUCOSE 148* 139*  BUN <5* 6  CREATININE 0.65 0.55  CALCIUM 8.9 9.0   Liver Function Tests:  Recent Labs Lab 09/05/15 0855  AST 25  ALT 23  ALKPHOS 78  BILITOT 1.1  PROT 7.1  ALBUMIN 4.0   CBC:  Recent Labs Lab 09/05/15 0855 09/06/15 0532  WBC 17.9* 8.7  NEUTROABS 13.6*  --   HGB 14.8 13.9  HCT 47.3* 42.2  MCV 95.0 92.7  PLT 309 189   BNP (last 3 results)  Recent Labs  09/05/15 0855  BNP 869.2*     Recent Results (from the past 240 hour(s))  MRSA PCR Screening     Status: None   Collection Time: 08/28/15  5:34 PM  Result Value Ref Range Status   MRSA by PCR NEGATIVE NEGATIVE Final    Comment:        The GeneXpert MRSA Assay (FDA approved for NASAL specimens only), is one component of a comprehensive MRSA colonization surveillance program. It is not intended to diagnose MRSA infection nor to guide or monitor treatment for MRSA infections.   MRSA PCR Screening  Status: None   Collection Time: 09/05/15  9:23 PM  Result Value Ref Range Status   MRSA by PCR NEGATIVE NEGATIVE Final    Comment:        The GeneXpert MRSA Assay (FDA approved for NASAL specimens only), is one component of a comprehensive MRSA colonization surveillance program. It is not intended to diagnose MRSA infection nor to guide or monitor treatment for MRSA infections.       Studies/Results: Ct Angio Chest Pe W/cm &/or Wo Cm  09/05/2015  CLINICAL DATA:  Respiratory distress beginning a few days ago when she was diagnosed with pneumonia, worsened today, lower extremity swelling for 4 days, leukocytosis, COPD, D-dimer = 0.77 EXAM: CT ANGIOGRAPHY CHEST WITH CONTRAST TECHNIQUE: Multidetector CT imaging of the chest  was performed using the standard protocol during bolus administration of intravenous contrast. Multiplanar CT image reconstructions and MIPs were obtained to evaluate the vascular anatomy. CONTRAST:  OMNIPAQUE IOHEXOL 350 MG/ML SOLN IV COMPARISON:  None FINDINGS: Scattered atherosclerotic calcifications aorta, coronary arteries and proximal great vessels. Aorta normal caliber without aneurysm or dissection. No thoracic adenopathy. Pulmonary arteries well opacified and patent. No evidence of pulmonary embolism. Visualized upper abdomen unremarkable. Small RIGHT pleural effusion. Minimal compressive atelectasis RIGHT lower lobe with subsegmental atelectasis at base of lingula. Mild central peribronchial thickening. No acute pulmonary infiltrate, pleural effusion, or pneumothorax. Osseous structures unremarkable. Review of the MIP images confirms the above findings. IMPRESSION: No evidence of pulmonary embolism. Significant atherosclerotic disease. Small RIGHT pleural effusion with minimal scattered atelectasis and central peribronchial thickening. Electronically Signed   By: Ulyses Southward M.D.   On: 09/05/2015 13:46   Dg Chest Portable 1 View  09/05/2015  CLINICAL DATA:  Shortness of breath today. EXAM: PORTABLE CHEST 1 VIEW COMPARISON:  08/28/2015 FINDINGS: Lungs are adequately inflated and demonstrate prominence of the perihilar markings suggesting vascular congestion. Minimal blunting of the right costophrenic angle as possible small amount right pleural fluid. Borderline stable cardiomegaly. Remainder of the exam is unchanged. IMPRESSION: Borderline stable cardiomegaly with findings suggesting mild vascular congestion. Electronically Signed   By: Elberta Fortis M.D.   On: 09/05/2015 09:59    Medications:  Scheduled: . enoxaparin (LOVENOX) injection  40 mg Subcutaneous Q24H  . ipratropium  0.5 mg Nebulization 6 times per day  . levalbuterol  0.63 mg Nebulization 6 times per day  . levofloxacin  (LEVAQUIN) IV  750 mg Intravenous Q24H  . methylPREDNISolone (SOLU-MEDROL) injection  60 mg Intravenous Daily  . sodium chloride  3 mL Intravenous Q12H   Continuous: . sodium chloride     JYN:[id number] **OR** acetaminophen, levalbuterol, ondansetron **OR** ondansetron (ZOFRAN) IV, polyethylene glycol  Assessment/Plan:  Active Problems:   Acute respiratory failure with hypoxia and hypercarbia (HCC)    Acute on chronic respiratory failure with hypercapnia This is secondary to her underlying COPD. Patient required BiPAP initially during this hospitalization. Currently only on nasal cannula. She feels much better. ABG had shown improvement. She does have evidence for chronic CO2 retention.  Acute COPD exacerbation Patient is improving. Continue nebulizer treatments. Change to Xopenex due to her tachycardia. Continue steroids and antibiotics. Continue oxygen.  Pedal edema Etiology remains unclear. No echocardiogram data available in our system. Proceed with echocardiogram. Borderline cardiomegaly noted on chest x-ray. She was given Lasix with improvement in her edema. Continue to monitor for now.   Tobacco abuse She was counseled to stop smoking cigarettes. She was offered nicotine patch, which she has declined for now.  DVT Prophylaxis:  Lovenox    Code Status: full code  Family Communication: discussed the patient and her husband  Disposition Plan: okay for transfer to telemetry.    LOS: 1 day   [facility]  [facility] Pager [phone] 09/06/2015, 2:01 PM  If 7PM-7AM, please contact night-coverage at [email or web address], password [facility]

## 2015-09-06 NOTE — Progress Notes (Signed)
Pt instructed to call for assistance toileting after IV lasix given. Husband found assisting pt up twice without calling out. Pt HR and oxygen sats changed and RN went to room both times to find [facility] RN reeducated pt and husband, told pt door could no longer be left open, and bed alarm would be kept on since she was unable to follow instructions. Continuing to closely monitor.

## 2015-09-06 NOTE — Progress Notes (Signed)
  Echocardiogram 2D Echocardiogram has been performed.  Arvil ChacoFoster, Shelley Pooley 09/06/2015, 5:00 PM

## 2015-09-06 NOTE — Care Management Note (Signed)
Case Management Note  Patient Details  Name: Lum KeasKathy D Gowdy MRN: [id number] Date of Birth: July 22, 1951  Subjective/Objective:             Shortness of breath required bipap on admission and then 40% FIO       Action/Plan:Date: September 06, 2015 Chart reviewed for concurrent status and case management needs. Will continue to follow patient for changes and needs: Marcelle Smilinghonda Necole Minassian, RN, BSN, [place]   [phone]   Expected Discharge Date:                  Expected Discharge Plan:  Home/Self Care  In-House Referral:  NA  Discharge planning Services  CM Consult  Post Acute Care Choice:  NA Choice offered to:  NA  DME Arranged:    DME Agency:     HH Arranged:    HH Agency:     Status of Service:  In process, will continue to follow  Medicare Important Message Given:    Date Medicare IM Given:    Medicare IM give by:    Date Additional Medicare IM Given:    Additional Medicare Important Message give by:     If discussed at Long Length of Stay Meetings, dates discussed:    Additional Comments:  [name], Burtis Imhoff Lynn, RN 09/06/2015, 11:38 AM

## 2015-09-07 MED ORDER — LEVOFLOXACIN 750 MG PO TABS: 750.0000 mg | ORAL_TABLET | Freq: Every day | ORAL | Status: DC

## 2015-09-07 MED ORDER — LEVOFLOXACIN 750 MG PO TABS
750.0000 mg | ORAL_TABLET | Freq: Every day | ORAL | Status: DC
  Administered 2015-09-07: 750 mg via ORAL
  Filled 2015-09-07: qty 1

## 2015-09-07 MED ORDER — NICOTINE 21 MG/24HR TD PT24: 21.0000 mg | MEDICATED_PATCH | Freq: Every day | TRANSDERMAL | Status: DC

## 2015-09-07 NOTE — Discharge Instructions (Signed)
Chronic Obstructive Pulmonary Disease Chronic obstructive pulmonary disease (COPD) is a common lung condition in which airflow from the lungs is limited. COPD is a general term that can be used to describe many different lung problems that limit airflow, including both chronic bronchitis and emphysema. If you have COPD, your lung function will probably never return to normal, but there are measures you can take to improve lung function and make yourself feel better. CAUSES   Smoking (common).  Exposure to secondhand smoke.  Genetic problems.  Chronic inflammatory lung diseases or recurrent infections. SYMPTOMS  Shortness of breath, especially with physical activity.  Deep, persistent (chronic) cough with a large amount of thick mucus.  Wheezing.  Rapid breaths (tachypnea).  Gray or bluish discoloration (cyanosis) of the skin, especially in your fingers, toes, or lips.  Fatigue.  Weight loss.  Frequent infections or episodes when breathing symptoms become much worse (exacerbations).  Chest tightness. DIAGNOSIS Your health care provider will take a medical history and perform a physical examination to diagnose COPD. Additional tests for COPD may include:  Lung (pulmonary) function tests.  Chest X-ray.  CT scan.  Blood tests. TREATMENT  Treatment for COPD may include:  Inhaler and nebulizer medicines. These help manage the symptoms of COPD and make your breathing more comfortable.  Supplemental oxygen. Supplemental oxygen is only helpful if you have a low oxygen level in your blood.  Exercise and physical activity. These are beneficial for nearly all people with COPD.  Lung surgery or transplant.  Nutrition therapy to gain weight, if you are underweight.  Pulmonary rehabilitation. This may involve working with a team of health care providers and specialists, such as respiratory, occupational, and physical therapists. HOME CARE INSTRUCTIONS  Take all medicines  (inhaled or pills) as directed by your health care provider.  Avoid over-the-counter medicines or cough syrups that dry up your airway (such as antihistamines) and slow down the elimination of secretions unless instructed otherwise by your health care provider.  If you are a smoker, the most important thing that you can do is stop smoking. Continuing to smoke will cause further lung damage and breathing trouble. Ask your health care provider for help with quitting smoking. He or she can direct you to community resources or hospitals that provide support.  Avoid exposure to irritants such as smoke, chemicals, and fumes that aggravate your breathing.  Use oxygen therapy and pulmonary rehabilitation if directed by your health care provider. If you require home oxygen therapy, ask your health care provider whether you should purchase a pulse oximeter to measure your oxygen level at home.  Avoid contact with individuals who have a contagious illness.  Avoid extreme temperature and humidity changes.  Eat healthy foods. Eating smaller, more frequent meals and resting before meals may help you maintain your strength.  Stay active, but balance activity with periods of rest. Exercise and physical activity will help you maintain your ability to do things you want to do.  Preventing infection and hospitalization is very important when you have COPD. Make sure to receive all the vaccines your health care provider recommends, especially the pneumococcal and influenza vaccines. Ask your health care provider whether you need a pneumonia vaccine.  Learn and use relaxation techniques to manage stress.  Learn and use controlled breathing techniques as directed by your health care provider. Controlled breathing techniques include:  Pursed lip breathing. Start by breathing in (inhaling) through your nose for 1 second. Then, purse your lips as if you were   going to whistle and breathe out (exhale) through the  pursed lips for 2 seconds.  Diaphragmatic breathing. Start by putting one hand on your abdomen just above your waist. Inhale slowly through your nose. The hand on your abdomen should move out. Then purse your lips and exhale slowly. You should be able to feel the hand on your abdomen moving in as you exhale.  Learn and use controlled coughing to clear mucus from your lungs. Controlled coughing is a series of short, progressive coughs. The steps of controlled coughing are: 1. Lean your head slightly forward. 2. Breathe in deeply using diaphragmatic breathing. 3. Try to hold your breath for 3 seconds. 4. Keep your mouth slightly open while coughing twice. 5. Spit any mucus out into a tissue. 6. Rest and repeat the steps once or twice as needed. SEEK MEDICAL CARE IF:  You are coughing up more mucus than usual.  There is a change in the color or thickness of your mucus.  Your breathing is more labored than usual.  Your breathing is faster than usual. SEEK IMMEDIATE MEDICAL CARE IF:  You have shortness of breath while you are resting.  You have shortness of breath that prevents you from:  Being able to talk.  Performing your usual physical activities.  You have chest pain lasting longer than 5 minutes.  Your skin color is more cyanotic than usual.  You measure low oxygen saturations for longer than 5 minutes with a pulse oximeter. MAKE SURE YOU:  Understand these instructions.  Will watch your condition.  Will get help right away if you are not doing well or get worse.   This information is not intended to replace advice given to you by your health care provider. Make sure you discuss any questions you have with your health care provider.   Document Released: 05/17/2005 Document Revised: 08/28/2014 Document Reviewed: 04/03/2013 Elsevier Interactive Patient Education 2016 [employer]  

## 2015-09-07 NOTE — Discharge Summary (Signed)
[facility]  Physician Discharge Summary   Patient ID: Samantha Dyer MRN: [id number] DOB/AGE: Jan 03, 1951 65 y.o.  Admit date: 09/05/2015 Discharge date: 09/07/2015  PCP: Lenora Boys, MD  DISCHARGE DIAGNOSES:  Active Problems:   Acute respiratory failure with hypoxia and hypercarbia (HCC)   RECOMMENDATIONS FOR OUTPATIENT FOLLOW UP: 1. Patient asked to wear her oxygen around the clock at least for the next few days   DISCHARGE CONDITION: fair  Diet recommendation: As before  [facility] Weights   09/05/15 2100 09/06/15 1546  Weight: 76.6 kg (168 lb 14 oz) 74.39 kg (164 lb)    INITIAL HISTORY: 66 year old Caucasian female with the past history of COPD who continues to smoke cigarettes. She was recently treated for pneumonia with azithromycin. She presented to [facility] on January 1 for COPD exacerbation, but left AGAINST MEDICAL ADVICE. She presented to this hospital with complaints of shortness of breath. She was placed on BiPAP. She started improving. She was hospitalized for further management.  Procedures: 2D ECHO Study Conclusions - Left ventricle: The cavity size was normal. Wall thickness wasincreased in a pattern of mild LVH. Systolic function was normal.The estimated ejection fraction was in the range of 55% to 60%.Wall motion was normal; there were no regional wall motionabnormalities. Due to tachycardia, there was fusion of early andatrial contributions to ventricular filling. - Mitral valve: Calcified annulus. There was mild regurgitation. - Left atrium: The atrium was mildly dilated.  HOSPITAL COURSE:   Acute on chronic respiratory failure with hypercapnia This is secondary to her underlying COPD. CT scan was negative for pulmonary embolism. Patient required BiPAP initially during this hospitalization. She was transitioned to nasal cannula. She started improving. Patient feels like she is back to her baseline. She has been ambulating in the  hallway without any difficulties. She was noted to have hypoxia with the exertion. She does have oxygen at home. She has been asked to wear her oxygen around the clock at least for the next days. ABG has shown improvement as well. Asked to stop smoking. She does have evidence for chronic CO2 retention.  Acute COPD exacerbation Patient has improved. She has nebulizer treatments at home, which she should continue. She does not want to go home on steroids. I think this is reasonable for now. She'll be prescribed antibiotics, however. Oxygen as mentioned above.   Pedal edema Etiology was unclear. Echocardiogram was obtained which shows normal systolic function. Edema, improved with Lasix. She likely has some element of chronic venous insufficiency. She was asked to use compression stockings.    Tobacco abuse She was counseled to stop smoking cigarettes. She was offered nicotine patch.  Overall improved. Patient very keen on going home today. Okay for discharge. Home oxygen as discussed above.    PERTINENT LABS:  The results of significant diagnostics from this hospitalization (including imaging, microbiology, ancillary and laboratory) are listed below for reference.    Microbiology: Recent Results (from the past 240 hour(s))  MRSA PCR Screening     Status: None   Collection Time: 08/28/15  5:34 PM  Result Value Ref Range Status   MRSA by PCR NEGATIVE NEGATIVE Final    Comment:        The GeneXpert MRSA Assay (FDA approved for NASAL specimens only), is one component of a comprehensive MRSA colonization surveillance program. It is not intended to diagnose MRSA infection nor to guide or monitor treatment for MRSA infections.   MRSA PCR Screening     Status: None  Collection Time: 09/05/15  9:23 PM  Result Value Ref Range Status   MRSA by PCR NEGATIVE NEGATIVE Final    Comment:        The GeneXpert MRSA Assay (FDA approved for NASAL specimens only), is one component of  a comprehensive MRSA colonization surveillance program. It is not intended to diagnose MRSA infection nor to guide or monitor treatment for MRSA infections.      Labs: Basic Metabolic Panel:  Recent Labs Lab 09/05/15 0855 09/06/15 0532  NA 134* 135  K 4.2 4.4  CL 85* 82*  CO2 41* 45*  GLUCOSE 148* 139*  BUN <5* 6  CREATININE 0.65 0.55  CALCIUM 8.9 9.0   Liver Function Tests:  Recent Labs Lab 09/05/15 0855  AST 25  ALT 23  ALKPHOS 78  BILITOT 1.1  PROT 7.1  ALBUMIN 4.0   CBC:  Recent Labs Lab 09/05/15 0855 09/06/15 0532  WBC 17.9* 8.7  NEUTROABS 13.6*  --   HGB 14.8 13.9  HCT 47.3* 42.2  MCV 95.0 92.7  PLT 309 189   BNP: BNP (last 3 results)  Recent Labs  09/05/15 0855  BNP 869.2*    IMAGING STUDIES Dg Chest 2 View  08/28/2015  CLINICAL DATA:  Shortness of breath for 6 days, worsening. Initial encounter. EXAM: CHEST  2 VIEW COMPARISON:  PA and lateral chest 05/07/2014 and 08/17/2012. FINDINGS: There is peribronchial thickening. Heart size is upper normal. No consolidative process, pneumothorax or effusion. Atherosclerosis is noted. No focal bony abnormality. IMPRESSION: Chronic bronchitic change.  No acute abnormality. Electronically Signed   By: Drusilla Kanner M.D.   On: 08/28/2015 11:24   Ct Angio Chest Pe W/cm &/or Wo Cm  09/05/2015  CLINICAL DATA:  Respiratory distress beginning a few days ago when she was diagnosed with pneumonia, worsened today, lower extremity swelling for 4 days, leukocytosis, COPD, D-dimer = 0.77 EXAM: CT ANGIOGRAPHY CHEST WITH CONTRAST TECHNIQUE: Multidetector CT imaging of the chest was performed using the standard protocol during bolus administration of intravenous contrast. Multiplanar CT image reconstructions and MIPs were obtained to evaluate the vascular anatomy. CONTRAST:  OMNIPAQUE IOHEXOL 350 MG/ML SOLN IV COMPARISON:  None FINDINGS: Scattered atherosclerotic calcifications aorta, coronary arteries and proximal  great vessels. Aorta normal caliber without aneurysm or dissection. No thoracic adenopathy. Pulmonary arteries well opacified and patent. No evidence of pulmonary embolism. Visualized upper abdomen unremarkable. Small RIGHT pleural effusion. Minimal compressive atelectasis RIGHT lower lobe with subsegmental atelectasis at base of lingula. Mild central peribronchial thickening. No acute pulmonary infiltrate, pleural effusion, or pneumothorax. Osseous structures unremarkable. Review of the MIP images confirms the above findings. IMPRESSION: No evidence of pulmonary embolism. Significant atherosclerotic disease. Small RIGHT pleural effusion with minimal scattered atelectasis and central peribronchial thickening. Electronically Signed   By: Ulyses Southward M.D.   On: 09/05/2015 13:46   Dg Chest Portable 1 View  09/05/2015  CLINICAL DATA:  Shortness of breath today. EXAM: PORTABLE CHEST 1 VIEW COMPARISON:  08/28/2015 FINDINGS: Lungs are adequately inflated and demonstrate prominence of the perihilar markings suggesting vascular congestion. Minimal blunting of the right costophrenic angle as possible small amount right pleural fluid. Borderline stable cardiomegaly. Remainder of the exam is unchanged. IMPRESSION: Borderline stable cardiomegaly with findings suggesting mild vascular congestion. Electronically Signed   By: Elberta Fortis M.D.   On: 09/05/2015 09:59    DISCHARGE EXAMINATION: Filed Vitals:   09/07/15 0521 09/07/15 0725 09/07/15 0900 09/07/15 1221  BP: 90/48 97/70    Pulse:  99   90  Temp: 98.6 F (37 C)     TempSrc: Oral     Resp: 18   16  Height:      Weight:      SpO2: 98%  98% 97%   General appearance: alert, cooperative, appears stated age and no distress Resp: Coarse breath sounds bilaterally. No wheezing heard today. No crackles. Cardio: regular rate and rhythm, S1, S2 normal, no murmur, click, rub or gallop GI: soft, non-tender; bowel sounds normal; no masses,  no  organomegaly Extremities: Improving edema  DISPOSITION: Home with husband  Discharge Instructions    Call MD for:  difficulty breathing, headache or visual disturbances    Complete by:  As directed      Call MD for:  extreme fatigue    Complete by:  As directed      Call MD for:  persistant dizziness or light-headedness    Complete by:  As directed      Call MD for:  persistant nausea and vomiting    Complete by:  As directed      Call MD for:  temperature >100.4    Complete by:  As directed      Diet - low sodium heart healthy    Complete by:  As directed      Discharge instructions    Complete by:  As directed   Follow up with your PCP next week. Stay on Oxygen while exerting. Please stop smoking cigarettes.   You were cared for by a hospitalist during your hospital stay. If you have any questions about your discharge medications or the care you received while you were in the hospital after you are discharged, you can call the unit and asked to speak with the hospitalist on call if the hospitalist that took care of you is not available. Once you are discharged, your primary care physician will handle any further medical issues. Please note that NO REFILLS for any discharge medications will be authorized once you are discharged, as it is imperative that you return to your primary care physician (or establish a relationship with a primary care physician if you do not have one) for your aftercare needs so that they can reassess your need for medications and monitor your lab values. If you do not have a primary care physician, you can call [phone] for a physician referral.     Increase activity slowly    Complete by:  As directed            ALLERGIES:  Allergies  Allergen Reactions  . Amoxicillin-Pot Clavulanate Swelling  . Prednisone Other (See Comments)    "drives her crazy"      Discharge Medication List as of 09/07/2015 12:46 PM    START taking these medications   Details   levofloxacin (LEVAQUIN) 750 MG tablet Take 1 tablet (750 mg total) by mouth daily. For 4 more days, Starting 09/07/2015, Until Discontinued, Print    nicotine (NICODERM CQ - DOSED IN MG/24 HOURS) 21 mg/24hr patch Place 1 patch (21 mg total) onto the skin daily., Starting 09/07/2015, Until Discontinued, Print      CONTINUE these medications which have NOT CHANGED   Details  BREO ELLIPTA 200-25 MCG/INH AEPB Inhale 1 puff into the lungs daily. , Starting 08/13/2015, Until Discontinued, Historical Med    dexamethasone (DECADRON) 2 MG tablet Take 2 mg by mouth daily. , Starting 09/02/2015, Until Discontinued, Historical Med    ibuprofen (ADVIL,MOTRIN) 200 MG tablet  Take 200 mg by mouth every 6 (six) hours as needed for moderate pain. , Until Discontinued, Historical Med    ipratropium-albuterol (DUONEB) 0.5-2.5 (3) MG/3ML SOLN Inhale 3 mLs into the lungs every 4 (four) hours as needed (Shortness of breath). , Starting 09/02/2015, Until Discontinued, Historical Med    OXYGEN Inhale into the lungs continuous. 2 L, Until Discontinued, Historical Med    tiotropium (SPIRIVA) 18 MCG inhalation capsule Place 18 mcg into inhaler and inhale daily., Until Discontinued, Historical Med      STOP taking these medications     azithromycin (ZITHROMAX) 250 MG tablet        Follow-up Information    Follow up with FRIED, ROBERT L, MD. Schedule an appointment as soon as possible for a visit in 1 week.   Specialty:  Family Medicine   Why:  post hospitalization follow up   Contact information:   [address] [place] [address] [phone]       Follow up with [facility].   Why:  Home 02 to be delivered to home.   Contact information:   [address] [place] [address] [phone]       TOTAL DISCHARGE TIME: 35 mins  [facility]  [facility] Pager [phone]  09/07/2015, 5:29 PM

## 2015-09-07 NOTE — Progress Notes (Signed)
PHARMACIST - PHYSICIAN COMMUNICATION DR:   Rito Ehrlich CONCERNING: Antibiotic IV to Oral Route Change Policy  RECOMMENDATION: This patient is receiving Levaquin by the intravenous route.  Based on criteria approved by the Pharmacy and Therapeutics Committee, the antibiotic(s) is/are being converted to the equivalent oral dose form(s).   DESCRIPTION: These criteria include:  Patient being treated for a respiratory tract infection, urinary tract infection, cellulitis or clostridium difficile associated diarrhea if on metronidazole  The patient is not neutropenic and does not exhibit a GI malabsorption state  The patient is eating (either orally or via tube) and/or has been taking other orally administered medications for a least 24 hours  The patient is improving clinically and has a Tmax < 100.5  If you have questions about this conversion, please contact the Pharmacy Department    [phone] )  [facility] Hawking   [phone] )  [facility]   [phone] )  [facility]   [phone] )  [facility]   [phone] )  [facility]   Junita Push, PharmD, [place] Pager: [phone]

## 2015-09-07 NOTE — Care Management Note (Signed)
Case Management Note  Patient Details  Name: Samantha Dyer MRN: [id number] Date of Birth: August 20, 1951  Subjective/Objective:    Spoke to patient about home 02-she bought her home 02 online(paid for it), never billed to [employer].  Now she qualifies for home 02, & MD ordered home 02-[facility] [phone] fax#[phone]-will fax demographics,02 sats,d/c summary,order.Home 02 will be delivered to patients home. Patient has travel tank to get home today.                Action/Plan:d/c plan home.   Expected Discharge Date:                  Expected Discharge Plan:  Home/Self Care  In-House Referral:  NA  Discharge planning Services  CM Consult  Post Acute Care Choice:  NA, Durable Medical Equipment (home 02 bought online.) Choice offered to:  NA, Patient  DME Arranged:  Oxygen DME Agency:  Other - Comment ([facility])  HH Arranged:    HH Agency:     Status of Service:  Completed, signed off  Medicare Important Message Given:    Date Medicare IM Given:    Medicare IM give by:    Date Additional Medicare IM Given:    Additional Medicare Important Message give by:     If discussed at Long Length of Stay Meetings, dates discussed:    Additional Comments:  Samantha, Danser, RN 09/07/2015, 12:05 PM

## 2015-09-07 NOTE — Progress Notes (Signed)
SATURATION QUALIFICATIONS: (This note is used to comply with regulatory documentation for home oxygen)  Patient Saturations on Room Air at Rest = 92%  Patient Saturations on Room Air while Ambulating = 85%  Patient Saturations on 3 Liters of oxygen while Ambulating = 94%

## 2015-11-25 ENCOUNTER — Ambulatory Visit (INDEPENDENT_AMBULATORY_CARE_PROVIDER_SITE_OTHER): Payer: 59 | Admitting: Pulmonary Disease

## 2015-11-25 ENCOUNTER — Encounter: Payer: Self-pay | Admitting: Pulmonary Disease

## 2015-11-25 VITALS — BP 171/73 | HR 90 | Ht 63.5 in | Wt 160.0 lb

## 2015-11-25 DIAGNOSIS — J441 Chronic obstructive pulmonary disease with (acute) exacerbation: Secondary | ICD-10-CM | POA: Diagnosis not present

## 2015-11-25 DIAGNOSIS — J9612 Chronic respiratory failure with hypercapnia: Secondary | ICD-10-CM

## 2015-11-25 DIAGNOSIS — F17200 Nicotine dependence, unspecified, uncomplicated: Secondary | ICD-10-CM | POA: Diagnosis not present

## 2015-11-25 DIAGNOSIS — J9611 Chronic respiratory failure with hypoxia: Secondary | ICD-10-CM | POA: Diagnosis not present

## 2015-11-25 NOTE — Assessment & Plan Note (Signed)
Trial of Anoro - this will be instead of BREO + SPIRIVA You have to QUIT smoking !!  Instead of combivent, we can try albuterol MDI   

## 2015-11-25 NOTE — Patient Instructions (Addendum)
Trial of Anoro - this will be instead of BREO + SPIRIVA You have to QUIT smoking !!  Instead of combivent, we can try albuterol MDI

## 2015-11-25 NOTE — Assessment & Plan Note (Signed)
Ct O2 during sleep 

## 2015-11-25 NOTE — Assessment & Plan Note (Signed)
I again discussed smoking cessation She is very ambivalent about this and would not commit to quit date

## 2015-11-25 NOTE — Progress Notes (Signed)
   Subjective:    Patient ID: Samantha Dyer, female    DOB: 04-04-1951, 65 y.o.   MRN: [id number]  HPI 65/F smoker for FU of gold stg 4 COPD, CO2 retainer .  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 Discharged on oxygen    11/25/2015  Chief Complaint  Patient presents with  . Follow-up    breathing is okay.  Had laser surgery several years ago and after hospital visit, eyesight has gotten bad again.  Would like referral to Opthalmologist in this area.    1941m FU  Adm 08/2015 She was recently treated for pneumonia with azithromycin. [facility] for COPD exacerbation, but left AGAINST MEDICAL ADVICE. She Then [facility] and was placed on BiPAP  Still works fulltime, does mainly computer work. Is fairly sedentary-light housework, minimal shopping due to DOE.  She continues to smoke a pack per day   Chantix affected her nerves, nicotine patches and inhaler did not help she has tried electronic cigarette   She is compliant with Spiriva, and continues to use Combivent 4-6 times a day. She uses oxygen on as needed basis  Significant tests/ events  2011Spirometry severe airway obstruction with FEV1 of 30% c/w Gold stg IV copd.   Review of Systems Patient denies significant dyspnea,cough, hemoptysis,  chest pain, palpitations, pedal edema, orthopnea, paroxysmal nocturnal dyspnea, lightheadedness, nausea, vomiting, abdominal or  leg pains      Objective:   Physical Exam  Gen. Pleasant, well-nourished, in no distress ENT - no post nasal drip, thrush + Neck: No JVD, no thyromegaly, no carotid bruits Lungs: no use of accessory muscles, no dullness to percussion, Decreased without rales or rhonchi  Cardiovascular: Rhythm regular, heart sounds  normal, no murmurs or gallops, no peripheral edema Musculoskeletal: No deformities, no cyanosis or clubbing        Assessment  & Plan:

## 2016-01-05 ENCOUNTER — Telehealth: Payer: Self-pay | Admitting: Pulmonary Disease

## 2016-01-05 NOTE — Telephone Encounter (Signed)
Pt was returning Patrice's call to schedule a rov in July per pt's last office visit.. Pt states she is doing well at this time and did not want to schedule a rov.  I advised pt to call our office if her s/s worsen.  Pt expressed understanding.  Nothing further needed.

## 2016-07-07 ENCOUNTER — Emergency Department (HOSPITAL_COMMUNITY): Payer: 59

## 2016-07-07 ENCOUNTER — Encounter (HOSPITAL_COMMUNITY): Payer: Self-pay | Admitting: Emergency Medicine

## 2016-07-07 ENCOUNTER — Inpatient Hospital Stay (HOSPITAL_COMMUNITY)
Admission: EM | Admit: 2016-07-07 | Discharge: 2016-07-09 | DRG: 190 | Discharge status: Against Medical Advice | Payer: 59 | Attending: Internal Medicine | Admitting: Internal Medicine

## 2016-07-07 DIAGNOSIS — Z800 Family history of malignant neoplasm of digestive organs: Secondary | ICD-10-CM | POA: Diagnosis not present

## 2016-07-07 DIAGNOSIS — Z9981 Dependence on supplemental oxygen: Secondary | ICD-10-CM | POA: Diagnosis not present

## 2016-07-07 DIAGNOSIS — J441 Chronic obstructive pulmonary disease with (acute) exacerbation: Principal | ICD-10-CM | POA: Diagnosis present

## 2016-07-07 DIAGNOSIS — J209 Acute bronchitis, unspecified: Secondary | ICD-10-CM | POA: Diagnosis not present

## 2016-07-07 DIAGNOSIS — E871 Hypo-osmolality and hyponatremia: Secondary | ICD-10-CM | POA: Diagnosis present

## 2016-07-07 DIAGNOSIS — Z888 Allergy status to other drugs, medicaments and biological substances status: Secondary | ICD-10-CM | POA: Diagnosis not present

## 2016-07-07 DIAGNOSIS — Z716 Tobacco abuse counseling: Secondary | ICD-10-CM | POA: Diagnosis not present

## 2016-07-07 DIAGNOSIS — J9611 Chronic respiratory failure with hypoxia: Secondary | ICD-10-CM

## 2016-07-07 DIAGNOSIS — Z803 Family history of malignant neoplasm of breast: Secondary | ICD-10-CM

## 2016-07-07 DIAGNOSIS — Z825 Family history of asthma and other chronic lower respiratory diseases: Secondary | ICD-10-CM | POA: Diagnosis not present

## 2016-07-07 DIAGNOSIS — E875 Hyperkalemia: Secondary | ICD-10-CM | POA: Diagnosis not present

## 2016-07-07 DIAGNOSIS — J9622 Acute and chronic respiratory failure with hypercapnia: Secondary | ICD-10-CM | POA: Diagnosis not present

## 2016-07-07 DIAGNOSIS — J9612 Chronic respiratory failure with hypercapnia: Secondary | ICD-10-CM | POA: Diagnosis present

## 2016-07-07 DIAGNOSIS — J9601 Acute respiratory failure with hypoxia: Secondary | ICD-10-CM | POA: Diagnosis present

## 2016-07-07 DIAGNOSIS — Z880 Allergy status to penicillin: Secondary | ICD-10-CM

## 2016-07-07 DIAGNOSIS — Z8249 Family history of ischemic heart disease and other diseases of the circulatory system: Secondary | ICD-10-CM | POA: Diagnosis not present

## 2016-07-07 DIAGNOSIS — J449 Chronic obstructive pulmonary disease, unspecified: Secondary | ICD-10-CM | POA: Diagnosis present

## 2016-07-07 DIAGNOSIS — J9602 Acute respiratory failure with hypercapnia: Secondary | ICD-10-CM | POA: Diagnosis not present

## 2016-07-07 DIAGNOSIS — Z5321 Procedure and treatment not carried out due to patient leaving prior to being seen by health care provider: Secondary | ICD-10-CM | POA: Diagnosis not present

## 2016-07-07 DIAGNOSIS — I5033 Acute on chronic diastolic (congestive) heart failure: Secondary | ICD-10-CM | POA: Diagnosis present

## 2016-07-07 DIAGNOSIS — I517 Cardiomegaly: Secondary | ICD-10-CM | POA: Diagnosis not present

## 2016-07-07 DIAGNOSIS — J9610 Chronic respiratory failure, unspecified whether with hypoxia or hypercapnia: Secondary | ICD-10-CM | POA: Diagnosis present

## 2016-07-07 DIAGNOSIS — K219 Gastro-esophageal reflux disease without esophagitis: Secondary | ICD-10-CM | POA: Diagnosis present

## 2016-07-07 DIAGNOSIS — R609 Edema, unspecified: Secondary | ICD-10-CM | POA: Diagnosis not present

## 2016-07-07 DIAGNOSIS — F17200 Nicotine dependence, unspecified, uncomplicated: Secondary | ICD-10-CM | POA: Diagnosis present

## 2016-07-07 DIAGNOSIS — F17210 Nicotine dependence, cigarettes, uncomplicated: Secondary | ICD-10-CM | POA: Diagnosis present

## 2016-07-07 LAB — CBC WITH DIFFERENTIAL/PLATELET
BASOS PCT: 0 %
Basophils Absolute: 0 10*3/uL (ref 0.0–0.1)
Eosinophils Absolute: 0.2 10*3/uL (ref 0.0–0.7)
Eosinophils Relative: 2 %
HEMATOCRIT: 41.5 % (ref 36.0–46.0)
HEMOGLOBIN: 13.6 g/dL (ref 12.0–15.0)
LYMPHS ABS: 0.8 10*3/uL (ref 0.7–4.0)
Lymphocytes Relative: 11 %
MCH: 29.8 pg (ref 26.0–34.0)
MCHC: 32.8 g/dL (ref 30.0–36.0)
MCV: 90.8 fL (ref 78.0–100.0)
MONOS PCT: 11 %
Monocytes Absolute: 0.8 10*3/uL (ref 0.1–1.0)
NEUTROS ABS: 5.8 10*3/uL (ref 1.7–7.7)
NEUTROS PCT: 76 %
Platelets: 169 10*3/uL (ref 150–400)
RBC: 4.57 MIL/uL (ref 3.87–5.11)
RDW: 13.8 % (ref 11.5–15.5)
WBC: 7.6 10*3/uL (ref 4.0–10.5)

## 2016-07-07 LAB — BRAIN NATRIURETIC PEPTIDE: B Natriuretic Peptide: 495.4 pg/mL — ABNORMAL HIGH (ref 0.0–100.0)

## 2016-07-07 LAB — COMPREHENSIVE METABOLIC PANEL
ALT: 10 U/L — ABNORMAL LOW (ref 14–54)
ANION GAP: 9 (ref 5–15)
AST: 17 U/L (ref 15–41)
Albumin: 4.1 g/dL (ref 3.5–5.0)
Alkaline Phosphatase: 84 U/L (ref 38–126)
BUN: 5 mg/dL — ABNORMAL LOW (ref 6–20)
CALCIUM: 8.7 mg/dL — AB (ref 8.9–10.3)
CHLORIDE: 87 mmol/L — AB (ref 101–111)
CO2: 35 mmol/L — AB (ref 22–32)
Creatinine, Ser: 0.67 mg/dL (ref 0.44–1.00)
GFR calc non Af Amer: >60 mL/min (ref >60)
Glucose, Bld: 110 mg/dL — ABNORMAL HIGH (ref 65–99)
POTASSIUM: 4.1 mmol/L (ref 3.5–5.1)
SODIUM: 131 mmol/L — AB (ref 135–145)
Total Bilirubin: 1.1 mg/dL (ref 0.3–1.2)
Total Protein: 6.3 g/dL — ABNORMAL LOW (ref 6.5–8.1)

## 2016-07-07 LAB — BLOOD GAS, VENOUS
Acid-Base Excess: 11.2 mmol/L — ABNORMAL HIGH (ref 0.0–2.0)
BICARBONATE: 39 mmol/L — AB (ref 20.0–28.0)
O2 Saturation: 76.6 %
PATIENT TEMPERATURE: 98.6
PH VEN: 7.38 (ref 7.250–7.430)
PO2 VEN: 39.8 mmHg (ref 32.0–45.0)
pCO2, Ven: 67.4 mmHg — ABNORMAL HIGH (ref 44.0–60.0)

## 2016-07-07 LAB — MRSA PCR SCREENING: MRSA BY PCR: NEGATIVE

## 2016-07-07 LAB — D-DIMER, QUANTITATIVE: D-Dimer, Quant: 0.45 ug/mL-FEU (ref 0.00–0.50)

## 2016-07-07 LAB — MAGNESIUM: Magnesium: 1.6 mg/dL — ABNORMAL LOW (ref 1.7–2.4)

## 2016-07-07 LAB — TROPONIN I

## 2016-07-07 MED ORDER — METHYLPREDNISOLONE SODIUM SUCC 125 MG IJ SOLR
60.0000 mg | Freq: Two times a day (BID) | INTRAMUSCULAR | Status: DC
  Administered 2016-07-08 – 2016-07-09 (×3): 60 mg via INTRAVENOUS
  Filled 2016-07-07 (×3): qty 2

## 2016-07-07 MED ORDER — LEVALBUTEROL HCL 0.63 MG/3ML IN NEBU: 0.6300 mg | INHALATION_SOLUTION | RESPIRATORY_TRACT | Status: DC | PRN

## 2016-07-07 MED ORDER — ONDANSETRON HCL 4 MG/2ML IJ SOLN: 4.0000 mg | Freq: Four times a day (QID) | INTRAMUSCULAR | Status: DC | PRN

## 2016-07-07 MED ORDER — FLUTICASONE FUROATE-VILANTEROL 200-25 MCG/INH IN AEPB
1.0000 | INHALATION_SPRAY | Freq: Every day | RESPIRATORY_TRACT | Status: DC
  Filled 2016-07-07: qty 28

## 2016-07-07 MED ORDER — NICOTINE 21 MG/24HR TD PT24
21.0000 mg | MEDICATED_PATCH | Freq: Every day | TRANSDERMAL | Status: DC
  Administered 2016-07-08 – 2016-07-09 (×2): 21 mg via TRANSDERMAL
  Filled 2016-07-07 (×2): qty 1

## 2016-07-07 MED ORDER — ENOXAPARIN SODIUM 40 MG/0.4ML ~~LOC~~ SOLN
40.0000 mg | SUBCUTANEOUS | Status: DC
  Administered 2016-07-07 – 2016-07-08 (×2): 40 mg via SUBCUTANEOUS
  Filled 2016-07-07 (×2): qty 0.4

## 2016-07-07 MED ORDER — HYDROCODONE-ACETAMINOPHEN 5-325 MG PO TABS: 1.0000 | ORAL_TABLET | ORAL | Status: DC | PRN

## 2016-07-07 MED ORDER — METHYLPREDNISOLONE SODIUM SUCC 125 MG IJ SOLR
125.0000 mg | Freq: Once | INTRAMUSCULAR | Status: AC
  Administered 2016-07-07: 125 mg via INTRAVENOUS
  Filled 2016-07-07: qty 2

## 2016-07-07 MED ORDER — IPRATROPIUM-ALBUTEROL 0.5-2.5 (3) MG/3ML IN SOLN
3.0000 mL | Freq: Four times a day (QID) | RESPIRATORY_TRACT | Status: DC
  Administered 2016-07-08: 3 mL via RESPIRATORY_TRACT
  Filled 2016-07-07: qty 3

## 2016-07-07 MED ORDER — ACETAMINOPHEN 650 MG RE SUPP: 650.0000 mg | Freq: Four times a day (QID) | RECTAL | Status: DC | PRN

## 2016-07-07 MED ORDER — SODIUM CHLORIDE 0.9% FLUSH
3.0000 mL | Freq: Two times a day (BID) | INTRAVENOUS | Status: DC
  Administered 2016-07-07 – 2016-07-09 (×4): 3 mL via INTRAVENOUS

## 2016-07-07 MED ORDER — ALBUTEROL (5 MG/ML) CONTINUOUS INHALATION SOLN
10.0000 mg/h | INHALATION_SOLUTION | RESPIRATORY_TRACT | Status: DC
  Administered 2016-07-07: 10 mg/h via RESPIRATORY_TRACT
  Filled 2016-07-07: qty 20

## 2016-07-07 MED ORDER — ONDANSETRON HCL 4 MG PO TABS: 4.0000 mg | ORAL_TABLET | Freq: Four times a day (QID) | ORAL | Status: DC | PRN

## 2016-07-07 MED ORDER — DOXYCYCLINE HYCLATE 100 MG PO TABS
100.0000 mg | ORAL_TABLET | Freq: Two times a day (BID) | ORAL | Status: DC
  Administered 2016-07-07 – 2016-07-09 (×4): 100 mg via ORAL
  Filled 2016-07-07 (×4): qty 1

## 2016-07-07 MED ORDER — ACETAMINOPHEN 325 MG PO TABS
650.0000 mg | ORAL_TABLET | Freq: Four times a day (QID) | ORAL | Status: DC | PRN
  Filled 2016-07-07: qty 2

## 2016-07-07 MED ORDER — GUAIFENESIN ER 600 MG PO TB12
600.0000 mg | ORAL_TABLET | Freq: Two times a day (BID) | ORAL | Status: DC
  Administered 2016-07-07 – 2016-07-09 (×4): 600 mg via ORAL
  Filled 2016-07-07 (×4): qty 1

## 2016-07-07 MED ORDER — ALBUTEROL SULFATE (2.5 MG/3ML) 0.083% IN NEBU
5.0000 mg | INHALATION_SOLUTION | Freq: Once | RESPIRATORY_TRACT | Status: AC
  Administered 2016-07-07: 5 mg via RESPIRATORY_TRACT
  Filled 2016-07-07: qty 6

## 2016-07-07 MED ORDER — FUROSEMIDE 10 MG/ML IJ SOLN
40.0000 mg | Freq: Once | INTRAMUSCULAR | Status: AC
  Administered 2016-07-07: 40 mg via INTRAVENOUS
  Filled 2016-07-07: qty 4

## 2016-07-07 MED ORDER — PANTOPRAZOLE SODIUM 40 MG PO TBEC
40.0000 mg | DELAYED_RELEASE_TABLET | Freq: Every day | ORAL | Status: DC
  Administered 2016-07-08 – 2016-07-09 (×2): 40 mg via ORAL
  Filled 2016-07-07 (×2): qty 1

## 2016-07-07 NOTE — ED Notes (Signed)
Pt has voided again.  NT helped her to get cleaned up

## 2016-07-07 NOTE — ED Notes (Signed)
Pt calls out with sob, appears tachypneic and is spo2 82%.  Placed pt on NRB and pt spo2 increased to 100%, pt did not want face mask of any kind so pt was placed back on [place] for transport

## 2016-07-07 NOTE — H&P (Signed)
Samantha Dyer ZOX:[id number] DOB: Jul 28, 1951 DOA: 07/07/2016     PCP: Lenora Boys, MD   Outpatient Specialists:[name]   Patient coming from:  home Lives With family   Chief Complaint: dyspnea  HPI: Samantha Dyer is a 65 y.o. female with medical history significant of COPD, chronic respiratory failure, tobacco abuse, noncompliance, pneumonia     Presented with worsening shortness of breath dyspnea increased cough over the past 1 week has been progressively getting worse. She is using 3 L of oxygen at home but usually at night only  She takes Combivent 4 times a day for past 3 days she hasn't been able to sleep and has to use pillows to prop herself up she has not been using her rescue nebulizer or albuterol treatments because she is said nobody have told her to do that. Denies any fever or chills no nausea no vomiting no constipation no chest pain patient presented to urgent care and was found to be satting 75% room air she was sent to emergency department from there. Patient reports that she is never able to lay down flat that's been going on for years. She has had chronic significant shortness of breath. denies any chest pain. Patient states at this point she is interested in quitting smoking. Reports chronic left lower extremity swelling that has been slightly worse lately   Regarding pertinent Chronic problems: Patient has been admitted in January 2017 for COPD exacerbation requiring BiPAP 2-D echo at that time shows mild LVH and preserved EF   IN ER:  Temp (24hrs), Avg:98.2 F (36.8 C), Min:98 F (36.7 C), Max:98.4 F (36.9 C) Patient was given nebulizer treatment with minimal improvement she was started on BiPAP VBG done She was given a dose of Lasix for presumed CHF exacerbation given chest x-ray worrisome for fluid overload and complaints of orthopnea.  Patient aggressively diuresed but states did not seem to help her breathing status. Patient refused BiPAP  stating it does not help her.  Respirations 25 currently 94% on 4 L heart rate 116 blood pressure 116/75 sodium 131 creatinine 0.67 WBC 7.6 BNP 495 VBG 7.380/67.4 Chest x-ray showed small bilateral effusions and interstitial pulmonary edema and borderline cardiomegaly  Following Medications were ordered in ER: Medications  albuterol (PROVENTIL,VENTOLIN) solution continuous neb (10 mg/hr Nebulization New Bag/Given 07/07/16 1835)  albuterol (PROVENTIL) (2.5 MG/3ML) 0.083% nebulizer solution 5 mg (5 mg Nebulization Given 07/07/16 1652)  methylPREDNISolone sodium succinate (SOLU-MEDROL) 125 mg/2 mL injection 125 mg (125 mg Intravenous Given 07/07/16 1757)  furosemide (LASIX) injection 40 mg (40 mg Intravenous Given 07/07/16 1830)     Hospitalist was called for admission for Acute and chronic respiratory failure with hypoxia most likely secondary to COPD exacerbation due to possible fluid overload component  Review of Systems:    Pertinent positives include: shortness of breath at rest  dyspnea on exertion, productive cough, wheezing.  Constitutional:  No weight loss, night sweats, Fevers, chills, fatigue, weight loss  HEENT:  No headaches, Difficulty swallowing,Tooth/dental problems,Sore throat,  No sneezing, itching, ear ache, nasal congestion, post nasal drip,  Cardio-vascular:  No chest pain, Orthopnea, PND, anasarca, dizziness, palpitations.no Bilateral lower extremity swelling  GI:  No heartburn, indigestion, abdominal pain, nausea, vomiting, diarrhea, change in bowel habits, loss of appetite, melena, blood in stool, hematemesis Resp:  no  No excess mucus, no, No non-productive cough, No coughing up of blood.No change in color of mucus.No  Skin:  no rash or lesions. No jaundice  GU:  no dysuria, change in color of urine, no urgency or frequency. No straining to urinate.  No flank pain.  Musculoskeletal:  No joint pain or no joint swelling. No decreased range of motion. No back  pain.  Psych:  No change in mood or affect. No depression or anxiety. No memory loss.  Neuro: no localizing neurological complaints, no tingling, no weakness, no double vision, no gait abnormality, no slurred speech, no confusion  As per HPI otherwise 10 point review of systems negative.   Past Medical History: Past Medical History:  Diagnosis Date  . Allergic rhinitis, cause unspecified   . Anemia, unspecified   . Backache, unspecified   . COPD (chronic obstructive pulmonary disease) (HCC)   . Esophageal reflux   . Iron deficiency anemia, unspecified    History reviewed. No pertinent surgical history.   Social History:  Ambulatory  independently     reports that she has been smoking Cigarettes.  She has a 43.00 pack-year smoking history. She has never used smokeless tobacco. She reports that she does not drink alcohol. Her drug history is not on file.  Allergies:   Allergies  Allergen Reactions  . Amoxicillin-Pot Clavulanate Swelling  . Prednisone Other (See Comments)    "drives her crazy"    Family History:   Family History  Problem Relation Age of Onset  . Emphysema Mother   . Heart disease Mother   . Breast cancer Mother   . Pancreatic cancer Father     Medications: Prior to Admission medications   Medication Sig Start Date End Date Taking? Authorizing Provider  BREO ELLIPTA 200-25 MCG/INH AEPB Inhale 1 puff into the lungs daily.  08/13/15  Yes Historical Provider, MD  esomeprazole (NEXIUM) 20 MG capsule Take 20 mg by mouth daily.    Yes Historical Provider, MD  Ipratropium-Albuterol (COMBIVENT) 20-100 MCG/ACT AERS respimat Inhale 1 puff into the lungs every 6 (six) hours.   Yes Historical Provider, MD  ipratropium-albuterol (DUONEB) 0.5-2.5 (3) MG/3ML SOLN Inhale 3 mLs into the lungs every 4 (four) hours as needed (Shortness of breath).  09/02/15  Yes Historical Provider, MD  nystatin (MYCOSTATIN) 100000 UNIT/ML suspension Take 5 mLs by mouth 4 (four) times  daily.   Yes Historical Provider, MD  tiotropium (SPIRIVA) 18 MCG inhalation capsule Place 18 mcg into inhaler and inhale daily.   Yes Historical Provider, MD  COMBIVENT RESPIMAT 20-100 MCG/ACT AERS respimat  06/09/16   Historical Provider, MD  fluticasone furoate-vilanterol (BREO ELLIPTA) 200-25 MCG/INH AEPB daily. 03/15/16   Historical Provider, MD  ibuprofen (ADVIL,MOTRIN) 200 MG tablet Take 200 mg by mouth every 6 (six) hours as needed for moderate pain.     Historical Provider, MD  nicotine (NICODERM CQ - DOSED IN MG/24 HOURS) 21 mg/24hr patch Place 1 patch (21 mg total) onto the skin daily. Patient not taking: Reported on 07/07/2016 09/07/15   Osvaldo ShipperGokul Krishnan, MD  OXYGEN Inhale into the lungs continuous. 2 L    Historical Provider, MD  tiotropium (SPIRIVA HANDIHALER) 18 MCG inhalation capsule daily. 03/15/16   Historical Provider, MD    Physical Exam: Patient Vitals for the past 24 hrs:  BP Temp Temp src Pulse Resp SpO2 Weight  07/07/16 1838 - - - - - 94 % -  07/07/16 1758 116/75 98 F (36.7 C) - 116 25 99 % -  07/07/16 1715 - - - 118 16 97 % -  07/07/16 1702 - - - 111 23 97 % -  07/07/16 1641 - - - - -  90 % 72.6 kg (160 lb)  07/07/16 1640 117/82 98.4 F (36.9 C) Oral 117 (!) 30 (!) 75 % -    1. General:  in No Acute distress 2. Psychological: Alert and   Oriented 3. Head/ENT:   Moist  Mucous Membranes                          Head Non traumatic, neck supple                           Poor Dentition 4. SKIN: normal  Skin turgor,  Skin clean Dry and intact no rash 5. Heart: Regular rate and rhythm no  Murmur, Rub or gallop 6. Lungs: some  Wheezes no  crackles   diminished breath sounds overall 7. Abdomen: Soft, non-tender, Non distended 8. Lower extremities: no clubbing, cyanosis, or edema 9. Neurologically Grossly intact, moving all 4 extremities equally  10. MSK: Normal range of motion   body mass index is 27.9 kg/m.  Labs on Admission:   Labs on Admission: I have  personally reviewed following labs and imaging studies  CBC:  Recent Labs Lab 07/07/16 1700  WBC 7.6  NEUTROABS 5.8  HGB 13.6  HCT 41.5  MCV 90.8  PLT 169   Basic Metabolic Panel:  Recent Labs Lab 07/07/16 1700  NA 131*  K 4.1  CL 87*  CO2 35*  GLUCOSE 110*  BUN 5*  CREATININE 0.67  CALCIUM 8.7*   GFR: CrCl cannot be calculated (Unknown ideal weight.). Liver Function Tests:  Recent Labs Lab 07/07/16 1700  AST 17  ALT 10*  ALKPHOS 84  BILITOT 1.1  PROT 6.3*  ALBUMIN 4.1   No results for input(s): LIPASE, AMYLASE in the last 168 hours. No results for input(s): AMMONIA in the last 168 hours. Coagulation Profile: No results for input(s): INR, PROTIME in the last 168 hours. Cardiac Enzymes: No results for input(s): CKTOTAL, CKMB, CKMBINDEX, TROPONINI in the last 168 hours. BNP (last 3 results) No results for input(s): PROBNP in the last 8760 hours. HbA1C: No results for input(s): HGBA1C in the last 72 hours. CBG: No results for input(s): GLUCAP in the last 168 hours. Lipid Profile: No results for input(s): CHOL, HDL, LDLCALC, TRIG, CHOLHDL, LDLDIRECT in the last 72 hours. Thyroid Function Tests: No results for input(s): TSH, T4TOTAL, FREET4, T3FREE, THYROIDAB in the last 72 hours. Anemia Panel: No results for input(s): VITAMINB12, FOLATE, FERRITIN, TIBC, IRON, RETICCTPCT in the last 72 hours. Urine analysis: No results found for: COLORURINE, APPEARANCEUR, LABSPEC, PHURINE, GLUCOSEU, HGBUR, BILIRUBINUR, KETONESUR, PROTEINUR, UROBILINOGEN, NITRITE, LEUKOCYTESUR Sepsis Labs: @LABRCNTIP (procalcitonin:4,lacticidven:4) )No results found for this or any previous visit (from the past 240 hour(s)).     UA  not ordered  No results found for: HGBA1C  CrCl cannot be calculated (Unknown ideal weight.).  BNP (last 3 results) No results for input(s): PROBNP in the last 8760 hours.   ECG REPORT  Independently reviewed Rate: 112  Rhythm: Sinus  tachycardia ST&T Change: No acute ischemic changes  QTC 458  Filed Weights   07/07/16 1641  Weight: 72.6 kg (160 lb)     Cultures:    Component Value Date/Time   SDES URINE, CATHETERIZED 11/05/2007 1325   SPECREQUEST NONE 11/05/2007 1325   CULT NO GROWTH 5 DAYS 11/04/2007 2125   REPTSTATUS 11/06/2007 FINAL 11/05/2007 1325     Radiological Exams on Admission: Dg Chest 2 View  Result  Date: 07/07/2016 CLINICAL DATA:  65 y/o  F; 1 week of shortness of breath. EXAM: CHEST  2 VIEW COMPARISON:  07/07/2016 chest radiograph FINDINGS: Borderline cardiomegaly is stable. Aortic atherosclerosis with arch calcification. Small right and trace left pleural effusions. Interstitial pulmonary edema. No significant interval change from prior radiograph. Bones are unremarkable. IMPRESSION: Small bilateral effusions and interstitial pulmonary edema. Borderline cardiomegaly. No significant interval change. Electronically Signed   By: Mitzi Hansen M.D.   On: 07/07/2016 17:47    Chart has been reviewed    Assessment/Plan   65 y.o. female with medical history significant of COPD, chronic respiratory failure, tobacco abuse, noncompliance, pneumonia being admitted for COPD exacerbation.   Present on Admission: . Acute respiratory failure with hypoxia (HCC) multifactorial but most likely secondary to COPD exacerbation. Attempted BiPAP in emergency department the patient does not tolerate well he was weaned off of BiPAP to 4 L nasal cannula satting low 90s patient continues to have increased work of breathing with minimal exertion. Admit to step down appreciate the CCM  E-link consult if  needed would restart BiPAP if able to tolerate to help her work of breathing . Hyponatremia - possibly secondary to mild fluid overload given leg edema. Patient received Lasix in emergency department will continue to follow obtain UA . COPD exacerbation (HCC) -  - Will initiate Steroid taper, Doxycycline, Xopenex  PRN, scheduled duoneb,  and Mucinex. Restart Breo at discharge  Titrate O2 to saturation >90%. Follow patients respiratory status. PCCM consulted for e-link monitoring, BiPAP ordered PRN for increased work of breathing. Currently mentating well no evidence of dramatic hypercarbia    . TOBACCO ABUSE examination currently interested in quitting smoking order nicotine patch   Given left lower extremity edema will check Dopplers   Other plan as per orders.  DVT prophylaxis:  Lovenox     Code Status:  FULL CODE  as per patient    Family Communication:   Family  at  Bedside  plan of care was discussed with   Husband,   Disposition Plan:    To home once workup is complete and patient is stable                               Consults called: PCCM for e-link monitoring   Admission status: inpatient       Level of care         SDU due to respiratory distress requiring high levels of oxygen and when necessary BiPAP     I have spent a total of 67 min on this admission    extra time was spent to discuss case with PCCM   Samantha Dyer 07/07/2016, 10:19 PM    [facility]  Pager [phone]   after 2 AM please page floor coverage PA If 7AM-7PM, please contact the day team taking care of the patient  [email or web address]  Password [email or web address]

## 2016-07-07 NOTE — ED Notes (Signed)
Pt voided in female urinal at the side of the bed, this caused her to become very sob.  Pt is still on bipap.

## 2016-07-07 NOTE — ED Notes (Signed)
RT called and she is on her way with bipap

## 2016-07-07 NOTE — Progress Notes (Signed)
eLink Physician-Brief Progress Note Patient Name: Lum KeasKathy D Durand DOB: Dec 27, 1950 MRN: [id number]   Date of Service  07/07/2016  HPI/Events of Note  Contacted by hospitalist regarding admission with COPD exacerbation. Patient followed by our group with known goal stage IV COPD and chronic CO2 retention. Patient somewhat intolerant of BiPAP. Can recheck shows patient resting comfortably with respiratory rate 18 breaths per minute. Currently on nasal cannula oxygen. Scheduled Solu-Medrol 60 mg IV every 12 hours & DuoNeb every 6 hours scheduled.   eICU Interventions  1. Recommended discontinuing home Breo 2. Adjusted goal saturation 4 88-94 percent to minimize VQ mismatching 3. Patient to be assessed by pulmonary in a.m.      Intervention Category Evaluation Type: New Patient Evaluation  Lawanda CousinsJennings Lavalle Skoda 07/07/2016, 10:21 PM

## 2016-07-07 NOTE — ED Triage Notes (Signed)
Pt complaint of SOB worsening over past week. Pt denies cough. Pt uses 3 lpm [place] at night ONLY.

## 2016-07-07 NOTE — ED Notes (Signed)
Pt removed off Bipap per MD order by Dr. Felipa Furnaceutova as pt states that she does not want to be on Bipap.

## 2016-07-07 NOTE — ED Provider Notes (Signed)
[facility]-EMERGENCY DEPT Provider Note   CSN: [id number] Arrival date & time: 07/07/16  1629     History   Chief Complaint Chief Complaint  Patient presents with  . Shortness of Breath    HPI Lum KeasKathy D Rio is a 65 y.o. female.  HPI   Patient is 65 year old female with history of COPD, current smoker presenting today with shortness of breath. Patient takes Combivent 4 times a day and has noticed that she has had increasing shortness breath over the last 1-2 weeks. Today she reports the last 3 days she's been unable to sleep at night and had to use pillows. She reports that she's not been using albuterol home because no told her to. She's had no fever, she has had cough and shortness of breath.  She would urgent care prior to arrival and they sent her here. Patient was 75% on room air on arrival.  Past Medical History:  Diagnosis Date  . Allergic rhinitis, cause unspecified   . Anemia, unspecified   . Backache, unspecified   . COPD (chronic obstructive pulmonary disease) (HCC)   . Esophageal reflux   . Iron deficiency anemia, unspecified     Patient Active Problem List   Diagnosis Date Noted  . COPD exacerbation (HCC) 08/28/2015  . TOBACCO ABUSE 07/26/2010  . Chronic respiratory failure (HCC) 07/26/2010  . UNSPECIFIED IRON DEFICIENCY ANEMIA 07/25/2010  . UNSPECIFIED ANEMIA 07/25/2010  . ALLERGIC RHINITIS CAUSE UNSPECIFIED 07/25/2010  . C O P D 07/25/2010  . Esophageal reflux 07/25/2010  . UNSPECIFIED BACKACHE 07/25/2010    History reviewed. No pertinent surgical history.  OB History    No data available       Home Medications    Prior to Admission medications   Medication Sig Start Date End Date Taking? Authorizing Provider  BREO ELLIPTA 200-25 MCG/INH AEPB Inhale 1 puff into the lungs daily.  08/13/15   Historical Provider, MD  ibuprofen (ADVIL,MOTRIN) 200 MG tablet Take 200 mg by mouth every 6 (six) hours as needed for moderate pain.     Historical Provider,  MD  ipratropium-albuterol (DUONEB) 0.5-2.5 (3) MG/3ML SOLN Inhale 3 mLs into the lungs every 4 (four) hours as needed (Shortness of breath).  09/02/15   Historical Provider, MD  nicotine (NICODERM CQ - DOSED IN MG/24 HOURS) 21 mg/24hr patch Place 1 patch (21 mg total) onto the skin daily. 09/07/15   Osvaldo ShipperGokul Krishnan, MD  OXYGEN Inhale into the lungs continuous. 2 L    Historical Provider, MD  tiotropium (SPIRIVA) 18 MCG inhalation capsule Place 18 mcg into inhaler and inhale daily.    Historical Provider, MD    Family History Family History  Problem Relation Age of Onset  . Emphysema Mother   . Heart disease Mother   . Breast cancer Mother   . Pancreatic cancer Father     Social History Social History  Substance Use Topics  . Smoking status: Current Every Day Smoker    Packs/day: 1.00    Years: 43.00    Types: Cigarettes  . Smokeless tobacco: Never Used  . Alcohol use No     Allergies   Amoxicillin-pot clavulanate and Prednisone   Review of Systems Review of Systems  Constitutional: Positive for fatigue. Negative for activity change and fever.  Respiratory: Positive for cough and shortness of breath. Negative for chest tightness.   Cardiovascular: Negative for chest pain, palpitations and leg swelling.  Gastrointestinal: Negative for abdominal pain.  All other systems reviewed and are negative.  Physical Exam Updated Vital Signs BP 117/82 (BP Location: Left Arm)   Pulse 117   Temp 98.4 F (36.9 C) (Oral)   Resp (!) 30   Wt 160 lb (72.6 kg)   SpO2 90%   BMI 27.90 kg/m   Physical Exam  Constitutional: She is oriented to person, place, and time. She appears well-developed and well-nourished.  HENT:  Head: Normocephalic and atraumatic.  Eyes: Right eye exhibits no discharge.  Cardiovascular: Regular rhythm and normal heart sounds.   No murmur heard. Tachycardia.  Pulmonary/Chest:  On 6 L, tachypneic. Patient has decreased breath sounds throughout.  Abdominal:  Soft. She exhibits no distension. There is no tenderness.  Neurological: She is oriented to person, place, and time.  Skin: Skin is warm and dry. She is not diaphoretic.  Psychiatric: She has a normal mood and affect.  Nursing note and vitals reviewed.    ED Treatments / Results  Labs (all labs ordered are listed, but only abnormal results are displayed) Labs Reviewed  COMPREHENSIVE METABOLIC PANEL  CBC WITH DIFFERENTIAL/PLATELET  BRAIN NATRIURETIC PEPTIDE  I-STAT TROPOININ, ED    EKG  EKG Interpretation None       Radiology No results found.  Procedures Procedures (including critical care time)  Medications Ordered in ED Medications  albuterol (PROVENTIL) (2.5 MG/3ML) 0.083% nebulizer solution 5 mg (not administered)  methylPREDNISolone sodium succinate (SOLU-MEDROL) 125 mg/2 mL injection 125 mg (not administered)     Initial Impression / Assessment and Plan / ED Course  I have reviewed the triage vital signs and the nursing notes.  Pertinent labs & imaging results that were available during my care of the patient were reviewed by me and considered in my medical decision making (see chart for details).  Clinical Course     Patient is a 65 year old female with history of COPD, presenting today with increasing short of breath over the last several weeks. Patient reports increasing SOB over the last 3 days. She is unable to exert herself or to sleep lying down. She went to an outside urgent care who sent her here. Patient is 75% on room air. On 6 L satting 92% as well as mild tachypnea.  > 4 min of smoking cessation done.   Although she does has very few wheezes on initial arrival I think that with albuterol she will open up and wheezes will increase. I would treat her for COPD exacerbation. Cannot rule out CHF exacerbation at this point. We'll await chest x-ray before giving fluids.  Unclear picture whether this is a COPD exacerbation versus CHF. Patient's lungs  sounds are still decreased, tried a DuoNeb and patient felt worse, her lungs opened up a little he could hear wheezing. Will place patient on BiPAP given her both Lasix and treatment for COPD exacerbation. Will need to admit to stepdown.  CRITICAL CARE Performed by: Arlana Hove Total critical care time: 45 minutes Critical care time was exclusive of separately billable procedures and treating other patients. Critical care was necessary to treat or prevent imminent or life-threatening deterioration. Critical care was time spent personally by me on the following activities: development of treatment plan with patient and/or surrogate as well as nursing, discussions with consultants, evaluation of patient's response to treatment, examination of patient, obtaining history from patient or surrogate, ordering and performing treatments and interventions, ordering and review of laboratory studies, ordering and review of radiographic studies, pulse oximetry and re-evaluation of patient's condition.     Final Clinical Impressions(s) /  ED Diagnoses   Final diagnoses:  None    New Prescriptions New Prescriptions   No medications on file     Juanna Pudlo Randall AnLyn Jendayi Berling, MD 07/07/16 1827

## 2016-07-07 NOTE — Progress Notes (Signed)
Rt placed pt on BIPAP in ED per MD order.  

## 2016-07-07 NOTE — ED Notes (Signed)
Called ICU about report.

## 2016-07-08 ENCOUNTER — Inpatient Hospital Stay (HOSPITAL_COMMUNITY): Payer: 59

## 2016-07-08 ENCOUNTER — Telehealth: Payer: Self-pay | Admitting: Internal Medicine

## 2016-07-08 DIAGNOSIS — J441 Chronic obstructive pulmonary disease with (acute) exacerbation: Principal | ICD-10-CM

## 2016-07-08 DIAGNOSIS — I517 Cardiomegaly: Secondary | ICD-10-CM

## 2016-07-08 LAB — RAPID URINE DRUG SCREEN, HOSP PERFORMED
AMPHETAMINES: NOT DETECTED
Barbiturates: NOT DETECTED
Benzodiazepines: NOT DETECTED
Cocaine: NOT DETECTED
OPIATES: NOT DETECTED
Tetrahydrocannabinol: NOT DETECTED

## 2016-07-08 LAB — CREATININE, URINE, RANDOM: CREATININE, URINE: 18.37 mg/dL

## 2016-07-08 LAB — PHOSPHORUS: PHOSPHORUS: 3.6 mg/dL (ref 2.5–4.6)

## 2016-07-08 LAB — COMPREHENSIVE METABOLIC PANEL
ALT: 8 U/L — AB (ref 14–54)
AST: 25 U/L (ref 15–41)
Albumin: 3.6 g/dL (ref 3.5–5.0)
Alkaline Phosphatase: 81 U/L (ref 38–126)
Anion gap: 12 (ref 5–15)
BILIRUBIN TOTAL: 1.2 mg/dL (ref 0.3–1.2)
BUN: 7 mg/dL (ref 6–20)
CHLORIDE: 85 mmol/L — AB (ref 101–111)
CO2: 34 mmol/L — ABNORMAL HIGH (ref 22–32)
CREATININE: 0.78 mg/dL (ref 0.44–1.00)
Calcium: 8.8 mg/dL — ABNORMAL LOW (ref 8.9–10.3)
Glucose, Bld: 178 mg/dL — ABNORMAL HIGH (ref 65–99)
POTASSIUM: 3.7 mmol/L (ref 3.5–5.1)
Sodium: 131 mmol/L — ABNORMAL LOW (ref 135–145)
TOTAL PROTEIN: 6.5 g/dL (ref 6.5–8.1)

## 2016-07-08 LAB — TSH: TSH: 0.654 u[IU]/mL (ref 0.350–4.500)

## 2016-07-08 LAB — URINALYSIS, ROUTINE W REFLEX MICROSCOPIC
BILIRUBIN URINE: NEGATIVE
Glucose, UA: NEGATIVE mg/dL
Hgb urine dipstick: NEGATIVE
Ketones, ur: NEGATIVE mg/dL
LEUKOCYTES UA: NEGATIVE
NITRITE: NEGATIVE
Protein, ur: NEGATIVE mg/dL
SPECIFIC GRAVITY, URINE: 1.007 (ref 1.005–1.030)
pH: 5.5 (ref 5.0–8.0)

## 2016-07-08 LAB — ECHOCARDIOGRAM COMPLETE: WEIGHTICAEL: 2472.68 [oz_av]

## 2016-07-08 LAB — TROPONIN I

## 2016-07-08 LAB — CBC
HCT: 42.2 % (ref 36.0–46.0)
Hemoglobin: 13.8 g/dL (ref 12.0–15.0)
MCH: 29.8 pg (ref 26.0–34.0)
MCHC: 32.7 g/dL (ref 30.0–36.0)
MCV: 91.1 fL (ref 78.0–100.0)
PLATELETS: 164 10*3/uL (ref 150–400)
RBC: 4.63 MIL/uL (ref 3.87–5.11)
RDW: 13.8 % (ref 11.5–15.5)
WBC: 5.8 10*3/uL (ref 4.0–10.5)

## 2016-07-08 LAB — MAGNESIUM: Magnesium: 1.5 mg/dL — ABNORMAL LOW (ref 1.7–2.4)

## 2016-07-08 LAB — SODIUM, URINE, RANDOM: SODIUM UR: 63 mmol/L

## 2016-07-08 LAB — BRAIN NATRIURETIC PEPTIDE: B Natriuretic Peptide: 523.1 pg/mL — ABNORMAL HIGH (ref 0.0–100.0)

## 2016-07-08 MED ORDER — POTASSIUM CHLORIDE CRYS ER 20 MEQ PO TBCR
40.0000 meq | EXTENDED_RELEASE_TABLET | Freq: Once | ORAL | Status: AC
  Administered 2016-07-08: 40 meq via ORAL
  Filled 2016-07-08: qty 2

## 2016-07-08 MED ORDER — MAGNESIUM SULFATE 4 GM/100ML IV SOLN
4.0000 g | Freq: Once | INTRAVENOUS | Status: AC
  Administered 2016-07-08: 4 g via INTRAVENOUS
  Filled 2016-07-08: qty 100

## 2016-07-08 MED ORDER — IPRATROPIUM-ALBUTEROL 0.5-2.5 (3) MG/3ML IN SOLN
3.0000 mL | Freq: Three times a day (TID) | RESPIRATORY_TRACT | Status: DC
Start: 2016-07-08 — End: 2016-07-09
  Administered 2016-07-08 – 2016-07-09 (×2): 3 mL via RESPIRATORY_TRACT
  Filled 2016-07-08 (×2): qty 3

## 2016-07-08 MED ORDER — MAGNESIUM OXIDE 400 (241.3 MG) MG PO TABS
400.0000 mg | ORAL_TABLET | Freq: Two times a day (BID) | ORAL | Status: AC
  Administered 2016-07-08 (×2): 400 mg via ORAL
  Filled 2016-07-08 (×2): qty 1

## 2016-07-08 NOTE — Progress Notes (Signed)
  Echocardiogram 2D Echocardiogram has been performed.  Samantha Dyer 07/08/2016, 10:05 AM

## 2016-07-08 NOTE — Telephone Encounter (Signed)
Triage   This is [facility] patient with copd . Now in with [facility]. Needs opd fu in < 2 weeks with myself or alva or an APP. Needs sprio preBD and post bd at time of fu to be done by Jerolyn Shinammy Wilson or PFT lab in hospital. Please arrange  Thanks  Dr. Kalman ShanMurali Ariane Ditullio, M.D., [facility] HospitalF.C.C.P Pulmonary and Critical Care Medicine Staff Physician [facility] Pager: [phone], If no answer or between  15:00h - 7:00h: call [phone]  07/08/2016 11:00 AM

## 2016-07-08 NOTE — Evaluation (Signed)
Physical Therapy Evaluation Patient Details Name: Samantha Dyer MRN: [id number] DOB: 1951-06-18 Today's Date: 07/08/2016   History of Present Illness  65 yo female admitted with hyponatremia, acute resp failure. Hx of COPD, anemia, non compliance  Clinical Impression  On eval, pt was Min guard assist for mobility. She walked ~75 feet. HR up to 155 bpm, O2 sats 95% on RA during ambulation. Pt was unsteady at times. Recommend daily ambulation in hallway with nursing/family supervision. Do not anticipate any follow up PT needs. Will sign off. 1x eval.     Follow Up Recommendations No PT follow up    Equipment Recommendations  None recommended by PT    Recommendations for Other Services       Precautions / Restrictions Precautions Precaution Comments: monitor HR, O2 sats Restrictions Weight Bearing Restrictions: No      Mobility  Bed Mobility               General bed mobility comments: sitting EOB  Transfers Overall transfer level: Needs assistance   Transfers: Sit to/from Stand Sit to Stand: Supervision            Ambulation/Gait Ambulation/Gait assistance: Min guard Ambulation Distance (Feet): 75 Feet Assistive device: None Gait Pattern/deviations: Step-through pattern     General Gait Details: unsteady at times but no overt LOB noted. HR up to 155 bpm, O2 sats 95% on RA during ambulation.  Stairs            Wheelchair Mobility    Modified Rankin (Stroke Patients Only)       Balance Overall balance assessment: Needs assistance           Standing balance-[name]: Good                               Pertinent Vitals/Pain Pain Assessment: No/denies pain    Home Living Family/patient expects to be discharged to:: Private residence Living Arrangements: Spouse/significant other;Children Available Help at Discharge: Family Type of Home: House Home Access: Stairs to enter Entrance Stairs-Rails: Conservation officer, historic buildingsight;Left Entrance  Stairs-Number of Steps: 3 Home Layout: One level Home Equipment: None      Prior Function Level of Independence: Independent               Hand Dominance        Extremity/Trunk Assessment   Upper Extremity Assessment: Overall WFL for tasks assessed           Lower Extremity Assessment: Overall WFL for tasks assessed      Cervical / Trunk Assessment: Normal  Communication   Communication: No difficulties  Cognition Arousal/Alertness: Awake/alert Behavior During Therapy: WFL for tasks assessed/performed Overall Cognitive Status: Within Functional Limits for tasks assessed                      General Comments      Exercises     Assessment/Plan    PT Assessment Patent does not need any further PT services  PT Problem List            PT Treatment Interventions      PT Goals (Current goals can be found in the Care Plan section)  Acute Rehab PT Goals Patient Stated Goal: to go home today PT Goal Formulation: All assessment and education complete, DC therapy    Frequency     Barriers to discharge        Co-evaluation  End of Session   Activity Tolerance: Patient tolerated treatment well Patient left: in bed;with call bell/phone within reach           Time: 1058-1106 PT Time Calculation (min) (ACUTE ONLY): 8 min   Charges:   PT Evaluation $PT Eval Low Complexity: 1 Procedure     PT G Codes:        Rebeca AlertJannie Treyson Axel, MPT Pager: [phone]

## 2016-07-08 NOTE — Consult Note (Addendum)
PULMONARY / CRITICAL CARE MEDICINE   Name: Samantha Dyer MRN: [id number] DOB: [birth year]    ADMISSION DATE:  07/07/2016 CONSULTATION DATE:  07/08/16  REFERRING MD:  Samantha [facility]  CHIEF COMPLAINT:  AECOPD  HISTORY OF PRESENT ILLNESS:   65 year old female followed b Samantha Dyer for COPD unclear stagewith notes stating stage 4 but no PFT on record and using 3L o2 at night. Ongoing tobacco  Had AECOPD Jan 2017 needing bipap. Admittd 07/07/2016 with 1 week of worsening dysnea, orthonea needing to sleep propped up (uanble to lie flat for years) but wihtout fever, chills, nausea, vomit, chest pain. Only mild worsenign of baseline chronic edema. At acute arrival pulse o75% RA. In ER refrused bipap and did not respond to initial lasix. CXR rwith small effusiona nd mild intersstitial edema and needing 4L [place]. Clincial dx was AECOPD with fluid component and acute on chronic respiratory failure  Per patient at baseline can do groceries with mild dyspnea but has no dyspnea for ADLs. All this on room air.She has been declinign for few weeks. Now feels well enough to go home. She says she has not followed up with PCCM in a while esp after last AECOPD in jan 2017  PAST MEDICAL HISTORY :  She  has a past medical history of Allergic rhinitis, cause unspecified; Anemia, unspecified; Backache, unspecified; COPD (chronic obstructive pulmonary disease) (HCC); Esophageal reflux; and Iron deficiency anemia, unspecified.  PAST SURGICAL HISTORY: She  has no past surgical history on file.  Allergies  Allergen Reactions  . Amoxicillin-Pot Clavulanate Swelling  . Prednisone Other (See Comments)    "drives her crazy"    No current facility-administered medications on file prior to encounter.    Current Outpatient Prescriptions on File Prior to Encounter  Medication Sig  . BREO ELLIPTA 200-25 MCG/INH AEPB Inhale 1 puff into the lungs daily.   Marland Kitchen. ipratropium-albuterol (DUONEB) 0.5-2.5 (3) MG/3ML SOLN Inhale 3  mLs into the lungs every 4 (four) hours as needed (Shortness of breath).   . tiotropium (SPIRIVA) 18 MCG inhalation capsule Place 18 mcg into inhaler and inhale daily.  Marland Kitchen. ibuprofen (ADVIL,MOTRIN) 200 MG tablet Take 200 mg by mouth every 6 (six) hours as needed for moderate pain.   . nicotine (NICODERM CQ - DOSED IN MG/24 HOURS) 21 mg/24hr patch Place 1 patch (21 mg total) onto the skin daily. (Patient not taking: Reported on 07/07/2016)  . OXYGEN Inhale into the lungs continuous. 2 L    FAMILY HISTORY:  Her indicated that the status of her mother is unknown. She indicated that the status of her father is unknown.    SOCIAL HISTORY: She  reports that she has been smoking Cigarettes.  She has a 43.00 pack-year smoking history. She has never used smokeless tobacco. She reports that she does not drink alcohol.  VITAL SIGNS: BP 119/71   Pulse 90   Temp 97.9 F (36.6 C) (Oral)   Resp (!) 22   Wt 70.1 kg (154 lb 8.7 oz)   SpO2 96%   BMI 26.95 kg/m   HEMODYNAMICS:    VENTILATOR SETTINGS: FiO2 (%):  [30 %] 30 %  INTAKE / OUTPUT: I/O last 3 completed shifts: In: 243 [P.O.:240; I.V.:3] Out: 250 [Urine:250]  PHYSICAL EXAMINATION: General:  Looks well. Sitting on side of bed.  Neuro:  AxOX3. Pleasant. Joking. No distrss HEENT:  No elevated JVP. No neck node Cardiovascular:  NSR. Normal heart sounds Lungs:  Still with some wheezing and  mildly labord when she talks Abdomen:  Soft, non tender Musculoskeletal:  No edema Skin:  Dry with some non specific rashes on back that are chronic like eczema  LABS:  PULMONARY  Recent Labs Lab 07/07/16 1650  HCO3 39.0*  O2SAT 76.6    CBC  Recent Labs Lab 07/07/16 1700 07/08/16 0221  HGB 13.6 13.8  HCT 41.5 42.2  WBC 7.6 5.8  PLT 169 164    COAGULATION No results for input(s): INR in the last 168 hours.  CARDIAC   Recent Labs Lab 07/07/16 2037 07/08/16 0221 07/08/16 0758  TROPONINI <0.03 <0.03 <0.03   No results for  input(s): PROBNP in the last 168 hours.   CHEMISTRY  Recent Labs Lab 07/07/16 1700 07/07/16 2037 07/08/16 0221  NA 131*  --  131*  K 4.1  --  3.7  CL 87*  --  85*  CO2 35*  --  34*  GLUCOSE 110*  --  178*  BUN 5*  --  7  CREATININE 0.67  --  0.78  CALCIUM 8.7*  --  8.8*  MG  --  1.6* 1.5*  PHOS  --   --  3.6   CrCl cannot be calculated (Unknown ideal weight.).   LIVER  Recent Labs Lab 07/07/16 1700 07/08/16 0221  AST 17 25  ALT 10* 8*  ALKPHOS 84 81  BILITOT 1.1 1.2  PROT 6.3* 6.5  ALBUMIN 4.1 3.6     INFECTIOUS No results for input(s): LATICACIDVEN, PROCALCITON in the last 168 hours.   ENDOCRINE CBG (last 3)  No results for input(s): GLUCAP in the last 72 hours.       IMAGING x48h  - image(s) personally visualized  -   highlighted in bold Dg Chest 2 View  Result Date: 07/07/2016 CLINICAL DATA:  65 y/o  F; 1 week of shortness of breath. EXAM: CHEST  2 VIEW COMPARISON:  07/07/2016 chest radiograph FINDINGS: Borderline cardiomegaly is stable. Aortic atherosclerosis with arch calcification. Small right and trace left pleural effusions. Interstitial pulmonary edema. No significant interval change from prior radiograph. Bones are unremarkable. IMPRESSION: Small bilateral effusions and interstitial pulmonary edema. Borderline cardiomegaly. No significant interval change. Electronically Signed   By: Samantha Dyer M.D.   On: 07/07/2016 17:47    ASSESSMENT / PLAN:  PULMONARY A: #baseline  - smoker  - copd - not following with PCCM on regular basis. Listed as stage 4 but clinically suspect stage 2./No PFT on record  #currently  Acute exacerbation COPD +/- Acute on chronic presumed diast v systolic CHF NOS Ongoing smoking P:   RVP Steroids - can go to PO 07/09/16 Nebs Give 1 more day in hospital - chagne to spiriva and dulera/symbicort/advair at dc Await echo Agree with doxy  CARDIOVASCULAR A:  Rule out chf nos P:  Await  echo  RENAL A:   Low mag P:   replete    Ok for floor  PCCM will sign off but keep on list to arrange fu . Call back if needed  D.w Samantha D[facility]  Samantha Dyer, M.D., [facility].C.C.P Pulmonary and Critical Care Medicine Staff Physician [facility] Pager: [phone], If no answer or between  15:00h - 7:00h: call [phone]  07/08/2016 10:58 AM

## 2016-07-08 NOTE — Progress Notes (Addendum)
PROGRESS NOTE    Samantha Dyer  RUE:[id number] DOB: [birth year] DOA: 07/07/2016  PCP: Lenora Boys, MD   Brief Narrative:  Samantha Dyer is a 65 y.o. female with medical history significant of COPD, chronic respiratory failure, tobacco abuse, noncompliance, pneumonia presented with worsening shortness of breath dyspnea increased cough over the past 1 week has been progressively getting worse. She is using 3 L of oxygen at home but usually at night only  She takes Combivent 4 times a day for past 3 days she hasn't been able to sleep and has to use pillows to prop herself up she has not been using her rescue nebulizer or albuterol treatments because she is said nobody have told her to do that. Denies any fever or chills no nausea no vomiting no constipation no chest pain patient presented to urgent care and was found to be satting 75% room air she was sent to emergency department from there. Patient reports that she is never able to lay down flat that's been going on for years. She has had chronic significant shortness of breath. denies any chest pain. Patient states at this point she is interested in quitting smoking. Reports chronic left lower extremity swelling that has been slightly worse lately  Subjective: Feels well enough to go home today. Mild cough, clear sputum. No sore throat or sinus issues. No chest pain.   Assessment & Plan:   Principal Problem:   COPD exacerbation - acute resp failure with hypoxia with chronic resp failure -no longer requiring O2- cont  Nebs, Solumedrol, PRN O2, Doxycycline- no longer requiring BiPAP  Active Problems:   TOBACCO ABUSE - Nicotine patch- counseled to stop smoking    Hyponatremia ? Fluid overload - given a dose of Lasix on admission-  ECHO report below- only grade 1 dCHF    Hypomagnesemia - given IV Mg+ 4 gm and will start tabs BID  DVT prophylaxis: Lovenox Code Status: full code Family Communication:  Disposition Plan: med/surg-  home hopefully tomorrow Consultants:   PCCM Procedures:  ECHO LVEF 55-60%, moderate LVH, normal wall motion, diastolic   dysfunction (grade 1), elevated LV filling pressure, moderately calcified   mitral annulus with trace to mild regurgitation, mild LAE, IVC   suggests RA pressure of 8 mmHg, which is elevated. Antimicrobials:  Anti-infectives    Start     Dose/Rate Route Frequency Ordered Stop   07/07/16 2200  doxycycline (VIBRA-TABS) tablet 100 mg     100 mg Oral Every 12 hours 07/07/16 2034         Objective: Vitals:   07/08/16 0700 07/08/16 0800 07/08/16 0809 07/08/16 0900  BP: 105/71 119/71  104/74  Pulse: 99 95 90 (!) 110  Resp: (!) 21 (!) 23 (!) 22 (!) 21  Temp:  97.9 F (36.6 C)    TempSrc:  Oral    SpO2: 96% 97% 96% 94%  Weight:        Intake/Output Summary (Last 24 hours) at 07/08/16 1215 Last data filed at 07/08/16 0100  Gross per 24 hour  Intake              243 ml  Output              250 ml  Net               -7 ml   Filed Weights   07/07/16 1641 07/08/16 0500  Weight: 72.6 kg (160 lb) 70.1 kg (154 lb 8.7 oz)  Examination: General exam: Appears comfortable  HEENT: PERRLA, oral mucosa moist, no sclera icterus or thrush Respiratory system: Clear to auscultation. Respiratory effort normal. Poor air entry Cardiovascular system: S1 & S2 heard, RRR.  No murmurs  Gastrointestinal system: Abdomen soft, non-tender, nondistended. Normal bowel sound. No organomegaly Central nervous system: Alert and oriented. No focal neurological deficits. Extremities: No cyanosis, clubbing or edema Skin: No rashes or ulcers Psychiatry:  Mood & affect appropriate.     Data Reviewed: I have personally reviewed following labs and imaging studies  CBC:  Recent Labs Lab 07/07/16 1700 07/08/16 0221  WBC 7.6 5.8  NEUTROABS 5.8  --   HGB 13.6 13.8  HCT 41.5 42.2  MCV 90.8 91.1  PLT 169 164   Basic Metabolic Panel:  Recent Labs Lab 07/07/16 1700 07/07/16 2037  07/08/16 0221  NA 131*  --  131*  K 4.1  --  3.7  CL 87*  --  85*  CO2 35*  --  34*  GLUCOSE 110*  --  178*  BUN 5*  --  7  CREATININE 0.67  --  0.78  CALCIUM 8.7*  --  8.8*  MG  --  1.6* 1.5*  PHOS  --   --  3.6   GFR: CrCl cannot be calculated (Unknown ideal weight.). Liver Function Tests:  Recent Labs Lab 07/07/16 1700 07/08/16 0221  AST 17 25  ALT 10* 8*  ALKPHOS 84 81  BILITOT 1.1 1.2  PROT 6.3* 6.5  ALBUMIN 4.1 3.6   No results for input(s): LIPASE, AMYLASE in the last 168 hours. No results for input(s): AMMONIA in the last 168 hours. Coagulation Profile: No results for input(s): INR, PROTIME in the last 168 hours. Cardiac Enzymes:  Recent Labs Lab 07/07/16 2037 07/08/16 0221 07/08/16 0758  TROPONINI <0.03 <0.03 <0.03   BNP (last 3 results) No results for input(s): PROBNP in the last 8760 hours. HbA1C: No results for input(s): HGBA1C in the last 72 hours. CBG: No results for input(s): GLUCAP in the last 168 hours. Lipid Profile: No results for input(s): CHOL, HDL, LDLCALC, TRIG, CHOLHDL, LDLDIRECT in the last 72 hours. Thyroid Function Tests:  Recent Labs  07/08/16 0221  TSH 0.654   Anemia Panel: No results for input(s): VITAMINB12, FOLATE, FERRITIN, TIBC, IRON, RETICCTPCT in the last 72 hours. Urine analysis:    Component Value Date/Time   COLORURINE YELLOW 07/08/2016 0100   APPEARANCEUR CLEAR 07/08/2016 0100   LABSPEC 1.007 07/08/2016 0100   PHURINE 5.5 07/08/2016 0100   GLUCOSEU NEGATIVE 07/08/2016 0100   HGBUR NEGATIVE 07/08/2016 0100   BILIRUBINUR NEGATIVE 07/08/2016 0100   KETONESUR NEGATIVE 07/08/2016 0100   PROTEINUR NEGATIVE 07/08/2016 0100   NITRITE NEGATIVE 07/08/2016 0100   LEUKOCYTESUR NEGATIVE 07/08/2016 0100   Sepsis Labs: @LABRCNTIP (procalcitonin:4,lacticidven:4) ) Recent Results (from the past 240 hour(s))  MRSA PCR Screening     Status: None   Collection Time: 07/07/16  8:15 PM  Result Value Ref Range Status    MRSA by PCR NEGATIVE NEGATIVE Final    Comment:        The GeneXpert MRSA Assay (FDA approved for NASAL specimens only), is one component of a comprehensive MRSA colonization surveillance program. It is not intended to diagnose MRSA infection nor to guide or monitor treatment for MRSA infections.          Radiology Studies: Dg Chest 2 View  Result Date: 07/07/2016 CLINICAL DATA:  65 y/o  F; 1 week of shortness of breath. EXAM:  CHEST  2 VIEW COMPARISON:  07/07/2016 chest radiograph FINDINGS: Borderline cardiomegaly is stable. Aortic atherosclerosis with arch calcification. Small right and trace left pleural effusions. Interstitial pulmonary edema. No significant interval change from prior radiograph. Bones are unremarkable. IMPRESSION: Small bilateral effusions and interstitial pulmonary edema. Borderline cardiomegaly. No significant interval change. Electronically Signed   By: Mitzi HansenLance  Furusawa-Stratton M.D.   On: 07/07/2016 17:47      Scheduled Meds: . doxycycline  100 mg Oral Q12H  . enoxaparin (LOVENOX) injection  40 mg Subcutaneous Q24H  . guaiFENesin  600 mg Oral BID  . ipratropium-albuterol  3 mL Inhalation Q6H  . magnesium sulfate 1 - 4 g bolus IVPB  4 g Intravenous Once  . methylPREDNISolone (SOLU-MEDROL) injection  60 mg Intravenous Q12H  . nicotine  21 mg Transdermal Daily  . pantoprazole  40 mg Oral Daily  . sodium chloride flush  3 mL Intravenous Q12H   Continuous Infusions: . albuterol Stopped (07/07/16 2009)     LOS: 1 day    Time spent in minutes: 35    Sukhman Martine, MD [facility] Pager: [email or web address] Password [email or web address] 07/08/2016, 12:15 PM

## 2016-07-09 ENCOUNTER — Inpatient Hospital Stay (HOSPITAL_COMMUNITY): Payer: 59

## 2016-07-09 DIAGNOSIS — R609 Edema, unspecified: Secondary | ICD-10-CM

## 2016-07-09 LAB — BASIC METABOLIC PANEL
ANION GAP: 11 (ref 5–15)
ANION GAP: 9 (ref 5–15)
Anion gap: 8 (ref 5–15)
BUN: 11 mg/dL (ref 6–20)
BUN: 12 mg/dL (ref 6–20)
BUN: 12 mg/dL (ref 6–20)
CHLORIDE: 83 mmol/L — AB (ref 101–111)
CHLORIDE: 83 mmol/L — AB (ref 101–111)
CO2: 34 mmol/L — AB (ref 22–32)
CO2: 33 mmol/L — ABNORMAL HIGH (ref 22–32)
CO2: 32 mmol/L (ref 22–32)
Calcium: 8.8 mg/dL — ABNORMAL LOW (ref 8.9–10.3)
Calcium: 9 mg/dL (ref 8.9–10.3)
Calcium: 8.8 mg/dL — ABNORMAL LOW (ref 8.9–10.3)
Chloride: 83 mmol/L — ABNORMAL LOW (ref 101–111)
Creatinine, Ser: 0.69 mg/dL (ref 0.44–1.00)
Creatinine, Ser: 0.82 mg/dL (ref 0.44–1.00)
Creatinine, Ser: 0.73 mg/dL (ref 0.44–1.00)
GFR calc Af Amer: >60 mL/min (ref >60)
GFR calc Af Amer: >60 mL/min (ref >60)
GFR calc non Af Amer: >60 mL/min (ref >60)
GFR calc non Af Amer: >60 mL/min (ref >60)
Glucose, Bld: 140 mg/dL — ABNORMAL HIGH (ref 65–99)
Glucose, Bld: 166 mg/dL — ABNORMAL HIGH (ref 65–99)
Glucose, Bld: 133 mg/dL — ABNORMAL HIGH (ref 65–99)
POTASSIUM: 4.9 mmol/L (ref 3.5–5.1)
POTASSIUM: 4.6 mmol/L (ref 3.5–5.1)
POTASSIUM: 4.4 mmol/L (ref 3.5–5.1)
SODIUM: 125 mmol/L — AB (ref 135–145)
SODIUM: 127 mmol/L — AB (ref 135–145)
SODIUM: 124 mmol/L — AB (ref 135–145)

## 2016-07-09 LAB — RESPIRATORY PANEL BY PCR
ADENOVIRUS-RVPPCR: NOT DETECTED
BORDETELLA PERTUSSIS-RVPCR: NOT DETECTED
CORONAVIRUS 229E-RVPPCR: NOT DETECTED
CORONAVIRUS HKU1-RVPPCR: NOT DETECTED
CORONAVIRUS NL63-RVPPCR: NOT DETECTED
Chlamydophila pneumoniae: NOT DETECTED
Coronavirus OC43: NOT DETECTED
Influenza A: NOT DETECTED
Influenza B: NOT DETECTED
METAPNEUMOVIRUS-RVPPCR: NOT DETECTED
Mycoplasma pneumoniae: NOT DETECTED
PARAINFLUENZA VIRUS 2-RVPPCR: NOT DETECTED
PARAINFLUENZA VIRUS 3-RVPPCR: NOT DETECTED
Parainfluenza Virus 1: NOT DETECTED
Parainfluenza Virus 4: NOT DETECTED
RHINOVIRUS / ENTEROVIRUS - RVPPCR: NOT DETECTED
Respiratory Syncytial Virus: NOT DETECTED

## 2016-07-09 LAB — MAGNESIUM: MAGNESIUM: 2.2 mg/dL (ref 1.7–2.4)

## 2016-07-09 MED ORDER — METHYLPREDNISOLONE SODIUM SUCC 125 MG IJ SOLR: 60.0000 mg | Freq: Every day | INTRAMUSCULAR | Status: DC

## 2016-07-09 MED ORDER — SODIUM CHLORIDE 0.9 % IV BOLUS (SEPSIS)
1000.0000 mL | Freq: Once | INTRAVENOUS | Status: AC
  Administered 2016-07-09: 1000 mL via INTRAVENOUS

## 2016-07-09 MED ORDER — BUDESONIDE 0.25 MG/2ML IN SUSP: 0.2500 mg | Freq: Two times a day (BID) | RESPIRATORY_TRACT | Status: DC

## 2016-07-09 MED ORDER — PREDNISONE 10 MG PO TABS: 60.0000 mg | ORAL_TABLET | Freq: Every day | ORAL | 0 refills | Status: DC

## 2016-07-09 MED ORDER — SODIUM CHLORIDE 0.9 % IV SOLN: INTRAVENOUS | Status: DC

## 2016-07-09 MED ORDER — GUAIFENESIN ER 600 MG PO TB12: 600.0000 mg | ORAL_TABLET | Freq: Two times a day (BID) | ORAL | 0 refills | Status: DC | PRN

## 2016-07-09 MED ORDER — SODIUM CHLORIDE 1 G PO TABS: 2.0000 g | ORAL_TABLET | Freq: Two times a day (BID) | ORAL | Status: DC

## 2016-07-09 MED ORDER — DOXYCYCLINE HYCLATE 100 MG PO TABS: 100.0000 mg | ORAL_TABLET | Freq: Two times a day (BID) | ORAL | 0 refills | Status: DC

## 2016-07-09 MED ORDER — SODIUM CHLORIDE 1 G PO TABS: 2.0000 g | ORAL_TABLET | Freq: Two times a day (BID) | ORAL | 0 refills | Status: DC

## 2016-07-09 NOTE — Progress Notes (Addendum)
PROGRESS NOTE    Samantha Dyer  ZOX:[id number] DOB: [birth year] DOA: 07/07/2016  PCP: Lenora Boys, MD   Brief Narrative:  Samantha Dyer is a 65 y.o. female with medical history significant of COPD, chronic respiratory failure, tobacco abuse, noncompliance, pneumonia presented with worsening shortness of breath dyspnea increased cough over the past 1 week has been progressively getting worse. She is using 3 L of oxygen at home but usually at night only  She takes Combivent 4 times a day for past 3 days she hasn't been able to sleep and has to use pillows to prop herself up she has not been using her rescue nebulizer or albuterol treatments because she is said nobody have told her to do that. Denies any fever or chills no nausea no vomiting no constipation no chest pain patient presented to urgent care and was found to be satting 75% room air she was sent to emergency department from there. Patient reports that she is never able to lay down flat that's been going on for years. She has had chronic significant shortness of breath. denies any chest pain. Patient states at this point she is interested in quitting smoking. Reports chronic left lower extremity swelling that has been slightly worse lately  Subjective: Feels short of breath this AM. Coughed most of the night and is coughing up quite a bit of sputum now.    Assessment & Plan:   Principal Problem:   COPD exacerbation - acute resp failure with hypoxia, hypercapnia with chronic resp failure -no longer requiring O2- cont  Nebs, wean Solumedrol, cont PRN O2, Doxycycline- no longer requiring BiPAP  Active Problems:  Hyponatremia/ hypocloremia - initially suspected to be fluid overloaded due to ankle edema but Na+ worse after giving Lasix in ER - will give a slow bolus of NS today- recheck this evening- has Gr 1 dCHF    TOBACCO ABUSE - Nicotine patch- counseled to stop smoking    Hypomagnesemia - given IV Mg+ 4 gm - started tabs  BID- Mg normal today  DVT prophylaxis: Lovenox Code Status: full code Family Communication:  Disposition Plan: med/surg- home hopefully tomorrow Consultants:   PCCM Procedures:  ECHO LVEF 55-60%, moderate LVH, normal wall motion, diastolic   dysfunction (grade 1), elevated LV filling pressure, moderately calcified   mitral annulus with trace to mild regurgitation, mild LAE, IVC   suggests RA pressure of 8 mmHg, which is elevated. Antimicrobials:  Anti-infectives    Start     Dose/Rate Route Frequency Ordered Stop   07/07/16 2200  doxycycline (VIBRA-TABS) tablet 100 mg     100 mg Oral Every 12 hours 07/07/16 2034         Objective: Vitals:   07/08/16 2128 07/09/16 0653 07/09/16 0900 07/09/16 0918  BP: 108/64 114/82    Pulse: (!) 108 84  (!) 114  Resp: 20 20  17   Temp: 97.9 F (36.6 C) 98 F (36.7 C)    TempSrc: Oral     SpO2: 90% 93%  94%  Weight:  70.9 kg (156 lb 4.8 oz) 70 kg (154 lb 5.2 oz)   Height:   5\' 4"  (1.626 m)     Intake/Output Summary (Last 24 hours) at 07/09/16 1055 Last data filed at 07/09/16 0653  Gross per 24 hour  Intake              580 ml  Output              100  ml  Net              480 ml   Filed Weights   07/08/16 0500 07/09/16 0653 07/09/16 0900  Weight: 70.1 kg (154 lb 8.7 oz) 70.9 kg (156 lb 4.8 oz) 70 kg (154 lb 5.2 oz)    Examination: General exam: Appears comfortable  HEENT: PERRLA, oral mucosa moist, no sclera icterus or thrush Respiratory system: Clear to auscultation. Respiratory effort normal. Poor air entry Cardiovascular system: S1 & S2 heard, RRR.  No murmurs  Gastrointestinal system: Abdomen soft, non-tender, nondistended. Normal bowel sound. No organomegaly Central nervous system: Alert and oriented. No focal neurological deficits. Extremities: No cyanosis, clubbing or edema Skin: No rashes or ulcers Psychiatry:  Mood & affect appropriate.     Data Reviewed: I have personally reviewed following labs and imaging  studies  CBC:  Recent Labs Lab 07/07/16 1700 07/08/16 0221  WBC 7.6 5.8  NEUTROABS 5.8  --   HGB 13.6 13.8  HCT 41.5 42.2  MCV 90.8 91.1  PLT 169 164   Basic Metabolic Panel:  Recent Labs Lab 07/07/16 1700 07/07/16 2037 07/08/16 0221 07/09/16 0506 07/09/16 0820  NA 131*  --  131* 125* 127*  K 4.1  --  3.7 4.9 4.6  CL 87*  --  85* 83* 83*  CO2 35*  --  34* 34* 33*  GLUCOSE 110*  --  178* 140* 166*  BUN 5*  --  7 11 12   CREATININE 0.67  --  0.78 0.69 0.82  CALCIUM 8.7*  --  8.8* 8.8* 9.0  MG  --  1.6* 1.5* 2.2  --   PHOS  --   --  3.6  --   --    GFR: Estimated Creatinine Clearance: 66.5 mL/min (by C-G formula based on SCr of 0.82 mg/dL). Liver Function Tests:  Recent Labs Lab 07/07/16 1700 07/08/16 0221  AST 17 25  ALT 10* 8*  ALKPHOS 84 81  BILITOT 1.1 1.2  PROT 6.3* 6.5  ALBUMIN 4.1 3.6   No results for input(s): LIPASE, AMYLASE in the last 168 hours. No results for input(s): AMMONIA in the last 168 hours. Coagulation Profile: No results for input(s): INR, PROTIME in the last 168 hours. Cardiac Enzymes:  Recent Labs Lab 07/07/16 2037 07/08/16 0221 07/08/16 0758  TROPONINI <0.03 <0.03 <0.03   BNP (last 3 results) No results for input(s): PROBNP in the last 8760 hours. HbA1C: No results for input(s): HGBA1C in the last 72 hours. CBG: No results for input(s): GLUCAP in the last 168 hours. Lipid Profile: No results for input(s): CHOL, HDL, LDLCALC, TRIG, CHOLHDL, LDLDIRECT in the last 72 hours. Thyroid Function Tests:  Recent Labs  07/08/16 0221  TSH 0.654   Anemia Panel: No results for input(s): VITAMINB12, FOLATE, FERRITIN, TIBC, IRON, RETICCTPCT in the last 72 hours. Urine analysis:    Component Value Date/Time   COLORURINE YELLOW 07/08/2016 0100   APPEARANCEUR CLEAR 07/08/2016 0100   LABSPEC 1.007 07/08/2016 0100   PHURINE 5.5 07/08/2016 0100   GLUCOSEU NEGATIVE 07/08/2016 0100   HGBUR NEGATIVE 07/08/2016 0100   BILIRUBINUR  NEGATIVE 07/08/2016 0100   KETONESUR NEGATIVE 07/08/2016 0100   PROTEINUR NEGATIVE 07/08/2016 0100   NITRITE NEGATIVE 07/08/2016 0100   LEUKOCYTESUR NEGATIVE 07/08/2016 0100   Sepsis Labs: @LABRCNTIP (procalcitonin:4,lacticidven:4) ) Recent Results (from the past 240 hour(s))  MRSA PCR Screening     Status: None   Collection Time: 07/07/16  8:15 PM  Result Value Ref Range  Status   MRSA by PCR NEGATIVE NEGATIVE Final    Comment:        The GeneXpert MRSA Assay (FDA approved for NASAL specimens only), is one component of a comprehensive MRSA colonization surveillance program. It is not intended to diagnose MRSA infection nor to guide or monitor treatment for MRSA infections.          Radiology Studies: Dg Chest 2 View  Result Date: 07/07/2016 CLINICAL DATA:  65 y/o  F; 1 week of shortness of breath. EXAM: CHEST  2 VIEW COMPARISON:  07/07/2016 chest radiograph FINDINGS: Borderline cardiomegaly is stable. Aortic atherosclerosis with arch calcification. Small right and trace left pleural effusions. Interstitial pulmonary edema. No significant interval change from prior radiograph. Bones are unremarkable. IMPRESSION: Small bilateral effusions and interstitial pulmonary edema. Borderline cardiomegaly. No significant interval change. Electronically Signed   By: Mitzi HansenLance  Furusawa-Stratton M.D.   On: 07/07/2016 17:47      Scheduled Meds: . doxycycline  100 mg Oral Q12H  . enoxaparin (LOVENOX) injection  40 mg Subcutaneous Q24H  . guaiFENesin  600 mg Oral BID  . methylPREDNISolone (SOLU-MEDROL) injection  60 mg Intravenous Q12H  . nicotine  21 mg Transdermal Daily  . pantoprazole  40 mg Oral Daily  . sodium chloride  1,000 mL Intravenous Once  . sodium chloride flush  3 mL Intravenous Q12H   Continuous Infusions: . albuterol Stopped (07/07/16 2009)     LOS: 2 days    Time spent in minutes: 35    Albertia Carvin, MD [facility] Pager: [email or web address] Password  [email or web address] 07/09/2016, 10:55 AM

## 2016-07-09 NOTE — Progress Notes (Signed)
*  PRELIMINARY RESULTS* Vascular Ultrasound Left lower extremity venous duplex has been completed.  Preliminary findings: No evidence of DVT or baker's cyst.  Farrel DemarkJill Eunice, RDMS, RVT  07/09/2016, 9:25 AM

## 2016-07-09 NOTE — Evaluation (Signed)
   Occupational Therapy Evaluation Patient Details Name: Samantha KeasKathy D Geil MRN: [id number] DOB: 01-25-1951 Today's Date: 07/09/2016    History of Present Illness 65 yo female admitted with hyponatremia, acute resp failure. Hx of COPD, anemia, non compliance   Clinical Impression   Pt feels she is baseline with ADL activity other than occasionally with increased SOB. Educated on energy conservation strategies.    Follow Up Recommendations  No OT follow up          Precautions / Restrictions Precautions Precaution Comments: monitor HR, O2 sats Restrictions Weight Bearing Restrictions: No      Mobility Bed Mobility               General bed mobility comments: sitting EOB  Transfers Overall transfer level: Needs assistance   Transfers: Sit to/from Stand;Stand Pivot Transfers Sit to Stand: Supervision Stand pivot transfers: Supervision                 ADL Overall ADL's : At baseline                                       General ADL Comments: pt feels she is at baseline with ADL activity except being a little more SOB. Pt aware and has oxygen at home as well as portable oxygen               Pertinent Vitals/Pain Pain Assessment: No/denies pain     Hand Dominance     Extremity/Trunk Assessment Upper Extremity Assessment Upper Extremity Assessment: Overall WFL for tasks assessed           Communication Communication Communication: No difficulties   Cognition Arousal/Alertness: Awake/alert Behavior During Therapy: WFL for tasks assessed/performed Overall Cognitive Status: Within Functional Limits for tasks assessed                                Home Living Family/patient expects to be discharged to:: Private residence Living Arrangements: Spouse/significant other;Children Available Help at Discharge: Family Type of Home: House Home Access: Stairs to enter Secretary/administratorntrance Stairs-Number of Steps: 3 Entrance  Stairs-Rails: Right;Left Home Layout: One level     Bathroom Shower/Tub: Tub/shower unit         Home Equipment: None          Prior Functioning/Environment Level of Independence: Independent                                        End of Session    Activity Tolerance: Patient tolerated treatment well Patient left: in bed;with call bell/phone within reach   Time: 1217-1229 OT Time Calculation (min): 12 min Charges:  OT General Charges $OT Visit: 1 Procedure OT Evaluation $OT Eval Low Complexity: 1 Procedure G-Codes:    Einar CrowEDDING, Lizet Kelso D 07/09/2016, 12:46 PM

## 2016-07-09 NOTE — Progress Notes (Signed)
Initial Nutrition Assessment  INTERVENTION:   Encourage PO intake, emphasizing protein intake  NUTRITION DIAGNOSIS:   Increased nutrient needs related to other (see comment) (COPD) as evidenced by estimated needs.  GOAL:   Patient will meet greater than or equal to 90% of their needs  MONITOR:   PO intake, Labs, Weight trends, I & O's  REASON FOR ASSESSMENT:   Consult COPD Protocol  ASSESSMENT:   65 y.o. female with medical history significant of COPD, chronic respiratory failure, tobacco abuse, noncompliance, pneumonia presented with worsening shortness of breath dyspnea increased cough over the past 1 week has been progressively getting worse.  Patient in room with husband at bedside. Pt stated multiple times during visit that she was eager to get home. Pt reports poor appetite but eating well. Pt's husband states pt does not eat well at home and eats usually once a day. Pt states she does not eat meat but sometimes will have beans or cheese with her meals. Pt does not drink protein supplements and declines them at this time d/t wanting to go home.  PO intakes in chart: 80%. Per chart review, pt has lost 19 lb since January 2017 (11% wt loss x 10.5 months, insignificant for time frame). No signs of muscle or fat depletion.  Medications reviewed. Labs reviewed: Low Na Mg/Phos WNL  Diet Order:  Diet Heart Room service appropriate? Yes; Fluid consistency: Thin  Skin:  Reviewed, no issues  Last BM:  11/19  Height:   Ht Readings from Last 1 Encounters:  07/09/16 5\' 4"  (1.626 m)    Weight:   Wt Readings from Last 1 Encounters:  07/09/16 154 lb 5.2 oz (70 kg)    Ideal Body Weight:  54.5 kg  BMI:  Body mass index is 26.49 kg/m.  Estimated Nutritional Needs:   Kcal:  2100-2300  Protein:  100-110g  Fluid:  2.1-2.3L/day  EDUCATION NEEDS:   Education needs addressed  Tilda FrancoLindsey Chariti Havel, MS, RD, LDN Pager: [phone] After Hours Pager: [phone]

## 2016-07-09 NOTE — Progress Notes (Signed)
Pt ambulated in hallway per MD request. Pt did become mildly SOB, but maintained O2 sat of 91%.  Dr. Butler Denmarkizwan notified. Pt decided to sign out AMA. Maeola Harmanark, Lydia Meng Johnson

## 2016-07-09 NOTE — Progress Notes (Signed)
Pt signed out AMA.  MD is aware of pt decision. Maeola Harmanark, Krystle Polcyn Johnson

## 2016-07-10 ENCOUNTER — Inpatient Hospital Stay (HOSPITAL_COMMUNITY)
Admission: EM | Admit: 2016-07-10 | Discharge: 2016-07-13 | DRG: 208 | Disposition: A | Discharge status: Home / Self Care | Payer: 59 | Attending: Pulmonary Disease | Admitting: Pulmonary Disease

## 2016-07-10 ENCOUNTER — Emergency Department (HOSPITAL_COMMUNITY): Payer: 59

## 2016-07-10 ENCOUNTER — Encounter (HOSPITAL_COMMUNITY): Payer: Self-pay | Admitting: *Deleted

## 2016-07-10 DIAGNOSIS — J9622 Acute and chronic respiratory failure with hypercapnia: Secondary | ICD-10-CM | POA: Diagnosis present

## 2016-07-10 DIAGNOSIS — J810 Acute pulmonary edema: Secondary | ICD-10-CM | POA: Diagnosis present

## 2016-07-10 DIAGNOSIS — J441 Chronic obstructive pulmonary disease with (acute) exacerbation: Secondary | ICD-10-CM | POA: Diagnosis not present

## 2016-07-10 DIAGNOSIS — R739 Hyperglycemia, unspecified: Secondary | ICD-10-CM | POA: Diagnosis present

## 2016-07-10 DIAGNOSIS — Z881 Allergy status to other antibiotic agents status: Secondary | ICD-10-CM | POA: Diagnosis not present

## 2016-07-10 DIAGNOSIS — I10 Essential (primary) hypertension: Secondary | ICD-10-CM | POA: Diagnosis present

## 2016-07-10 DIAGNOSIS — J9601 Acute respiratory failure with hypoxia: Secondary | ICD-10-CM | POA: Diagnosis not present

## 2016-07-10 DIAGNOSIS — J440 Chronic obstructive pulmonary disease with acute lower respiratory infection: Secondary | ICD-10-CM | POA: Diagnosis present

## 2016-07-10 DIAGNOSIS — Z888 Allergy status to other drugs, medicaments and biological substances status: Secondary | ICD-10-CM | POA: Diagnosis not present

## 2016-07-10 DIAGNOSIS — G9340 Encephalopathy, unspecified: Secondary | ICD-10-CM | POA: Diagnosis present

## 2016-07-10 DIAGNOSIS — T380X5A Adverse effect of glucocorticoids and synthetic analogues, initial encounter: Secondary | ICD-10-CM | POA: Diagnosis present

## 2016-07-10 DIAGNOSIS — R0603 Acute respiratory distress: Secondary | ICD-10-CM

## 2016-07-10 DIAGNOSIS — E875 Hyperkalemia: Secondary | ICD-10-CM | POA: Diagnosis present

## 2016-07-10 DIAGNOSIS — E872 Acidosis: Secondary | ICD-10-CM | POA: Diagnosis present

## 2016-07-10 DIAGNOSIS — E871 Hypo-osmolality and hyponatremia: Secondary | ICD-10-CM | POA: Diagnosis not present

## 2016-07-10 DIAGNOSIS — I5030 Unspecified diastolic (congestive) heart failure: Secondary | ICD-10-CM | POA: Diagnosis present

## 2016-07-10 DIAGNOSIS — D696 Thrombocytopenia, unspecified: Secondary | ICD-10-CM | POA: Diagnosis present

## 2016-07-10 DIAGNOSIS — K219 Gastro-esophageal reflux disease without esophagitis: Secondary | ICD-10-CM | POA: Diagnosis present

## 2016-07-10 DIAGNOSIS — F17210 Nicotine dependence, cigarettes, uncomplicated: Secondary | ICD-10-CM | POA: Diagnosis present

## 2016-07-10 DIAGNOSIS — J209 Acute bronchitis, unspecified: Secondary | ICD-10-CM | POA: Diagnosis present

## 2016-07-10 DIAGNOSIS — J309 Allergic rhinitis, unspecified: Secondary | ICD-10-CM | POA: Diagnosis present

## 2016-07-10 DIAGNOSIS — J9621 Acute and chronic respiratory failure with hypoxia: Principal | ICD-10-CM | POA: Diagnosis present

## 2016-07-10 DIAGNOSIS — E876 Hypokalemia: Secondary | ICD-10-CM | POA: Diagnosis present

## 2016-07-10 DIAGNOSIS — J9602 Acute respiratory failure with hypercapnia: Secondary | ICD-10-CM | POA: Diagnosis not present

## 2016-07-10 DIAGNOSIS — J811 Chronic pulmonary edema: Secondary | ICD-10-CM | POA: Diagnosis present

## 2016-07-10 LAB — BLOOD GAS, ARTERIAL
ACID-BASE EXCESS: 2.6 mmol/L — AB (ref 0.0–2.0)
ACID-BASE EXCESS: 4.1 mmol/L — AB (ref 0.0–2.0)
BICARBONATE: 33.1 mmol/L — AB (ref 20.0–28.0)
BICARBONATE: 29.6 mmol/L — AB (ref 20.0–28.0)
Drawn by: 232811
Drawn by: 308601
FIO2: 100
FIO2: 40
LHR: 14 {breaths}/min
LHR: 22 {breaths}/min
MECHVT: 440 mL
O2 SAT: 99.6 %
O2 Saturation: 99 %
PEEP/CPAP: 5 cmH2O
PEEP/CPAP: 5 cmH2O
PH ART: 7.242 — AB (ref 7.350–7.450)
Patient temperature: 97.4
Patient temperature: 98.6
VT: 440 mL
pCO2 arterial: 78.9 mmHg (ref 32.0–48.0)
pCO2 arterial: 50 mmHg — ABNORMAL HIGH (ref 32.0–48.0)
pH, Arterial: 7.39 (ref 7.350–7.450)
pO2, Arterial: 137 mmHg — ABNORMAL HIGH (ref 83.0–108.0)

## 2016-07-10 LAB — URINALYSIS, ROUTINE W REFLEX MICROSCOPIC
Bilirubin Urine: NEGATIVE
GLUCOSE, UA: NEGATIVE mg/dL
KETONES UR: NEGATIVE mg/dL
LEUKOCYTES UA: NEGATIVE
NITRITE: NEGATIVE
PROTEIN: NEGATIVE mg/dL
Specific Gravity, Urine: 1.011 (ref 1.005–1.030)
pH: 5 (ref 5.0–8.0)

## 2016-07-10 LAB — GLUCOSE, CAPILLARY
GLUCOSE-CAPILLARY: 113 mg/dL — AB (ref 65–99)
GLUCOSE-CAPILLARY: 115 mg/dL — AB (ref 65–99)
GLUCOSE-CAPILLARY: 126 mg/dL — AB (ref 65–99)
GLUCOSE-CAPILLARY: 131 mg/dL — AB (ref 65–99)

## 2016-07-10 LAB — URINE MICROSCOPIC-ADD ON

## 2016-07-10 LAB — CBC WITH DIFFERENTIAL/PLATELET
BASOS ABS: 0 10*3/uL (ref 0.0–0.1)
Basophils Relative: 0 %
EOS ABS: 0 10*3/uL (ref 0.0–0.7)
EOS PCT: 0 %
HCT: 45.8 % (ref 36.0–46.0)
Hemoglobin: 15.4 g/dL — ABNORMAL HIGH (ref 12.0–15.0)
Lymphocytes Relative: 8 %
Lymphs Abs: 1.8 10*3/uL (ref 0.7–4.0)
MCH: 30.2 pg (ref 26.0–34.0)
MCHC: 33.6 g/dL (ref 30.0–36.0)
MCV: 89.8 fL (ref 78.0–100.0)
Monocytes Absolute: 2.3 10*3/uL — ABNORMAL HIGH (ref 0.1–1.0)
Monocytes Relative: 10 %
Neutro Abs: 18.6 10*3/uL — ABNORMAL HIGH (ref 1.7–7.7)
Neutrophils Relative %: 82 %
PLATELETS: 298 10*3/uL (ref 150–400)
RBC: 5.1 MIL/uL (ref 3.87–5.11)
RDW: 13.5 % (ref 11.5–15.5)
WBC: 22.7 10*3/uL — AB (ref 4.0–10.5)

## 2016-07-10 LAB — I-STAT CHEM 8, ED
BUN: 14 mg/dL (ref 6–20)
CALCIUM ION: 1.08 mmol/L — AB (ref 1.15–1.40)
CHLORIDE: 83 mmol/L — AB (ref 101–111)
Creatinine, Ser: 0.9 mg/dL (ref 0.44–1.00)
Glucose, Bld: 215 mg/dL — ABNORMAL HIGH (ref 65–99)
HCT: 50 % — ABNORMAL HIGH (ref 36.0–46.0)
HEMOGLOBIN: 17 g/dL — AB (ref 12.0–15.0)
POTASSIUM: 4.9 mmol/L (ref 3.5–5.1)
SODIUM: 121 mmol/L — AB (ref 135–145)
TCO2: 32 mmol/L (ref 0–100)

## 2016-07-10 LAB — BASIC METABOLIC PANEL
ANION GAP: 13 (ref 5–15)
BUN: 17 mg/dL (ref 6–20)
CO2: 30 mmol/L (ref 22–32)
Calcium: 8.5 mg/dL — ABNORMAL LOW (ref 8.9–10.3)
Chloride: 82 mmol/L — ABNORMAL LOW (ref 101–111)
Creatinine, Ser: 0.96 mg/dL (ref 0.44–1.00)
GFR calc Af Amer: >60 mL/min (ref >60)
GLUCOSE: 219 mg/dL — AB (ref 65–99)
POTASSIUM: 3.3 mmol/L — AB (ref 3.5–5.1)
Sodium: 125 mmol/L — ABNORMAL LOW (ref 135–145)

## 2016-07-10 LAB — COMPREHENSIVE METABOLIC PANEL
ALBUMIN: 4.5 g/dL (ref 3.5–5.0)
ALK PHOS: 84 U/L (ref 38–126)
ALT: 14 U/L (ref 14–54)
ANION GAP: 7 (ref 5–15)
AST: 29 U/L (ref 15–41)
BUN: 14 mg/dL (ref 6–20)
CALCIUM: 8.8 mg/dL — AB (ref 8.9–10.3)
CHLORIDE: 83 mmol/L — AB (ref 101–111)
CO2: 33 mmol/L — AB (ref 22–32)
Creatinine, Ser: 0.73 mg/dL (ref 0.44–1.00)
GFR calc Af Amer: >60 mL/min (ref >60)
GFR calc non Af Amer: >60 mL/min (ref >60)
GLUCOSE: 220 mg/dL — AB (ref 65–99)
Potassium: 4.9 mmol/L (ref 3.5–5.1)
SODIUM: 123 mmol/L — AB (ref 135–145)
Total Bilirubin: 0.9 mg/dL (ref 0.3–1.2)
Total Protein: 7.1 g/dL (ref 6.5–8.1)

## 2016-07-10 LAB — I-STAT TROPONIN, ED: Troponin i, poc: 0.02 ng/mL (ref 0.00–0.08)

## 2016-07-10 LAB — PROCALCITONIN: Procalcitonin: 0.25 ng/mL

## 2016-07-10 LAB — CBC
HEMATOCRIT: 41.5 % (ref 36.0–46.0)
Hemoglobin: 14.1 g/dL (ref 12.0–15.0)
MCH: 30.2 pg (ref 26.0–34.0)
MCHC: 34 g/dL (ref 30.0–36.0)
MCV: 88.9 fL (ref 78.0–100.0)
PLATELETS: 152 10*3/uL (ref 150–400)
RBC: 4.67 MIL/uL (ref 3.87–5.11)
RDW: 13.6 % (ref 11.5–15.5)
WBC: 16.9 10*3/uL — AB (ref 4.0–10.5)

## 2016-07-10 LAB — TRIGLYCERIDES: TRIGLYCERIDES: 127 mg/dL (ref <150)

## 2016-07-10 LAB — BRAIN NATRIURETIC PEPTIDE
B Natriuretic Peptide: 642.6 pg/mL — ABNORMAL HIGH (ref 0.0–100.0)
B Natriuretic Peptide: 505.1 pg/mL — ABNORMAL HIGH (ref 0.0–100.0)

## 2016-07-10 LAB — STREP PNEUMONIAE URINARY ANTIGEN: Strep Pneumo Urinary Antigen: NEGATIVE

## 2016-07-10 MED ORDER — PROPOFOL 1000 MG/100ML IV EMUL
0.0000 ug/kg/min | INTRAVENOUS | Status: DC
  Administered 2016-07-10: 40 ug/kg/min via INTRAVENOUS
  Administered 2016-07-10 (×2): 20 ug/kg/min via INTRAVENOUS
  Administered 2016-07-10: 40 ug/kg/min via INTRAVENOUS
  Administered 2016-07-10 – 2016-07-11 (×2): 45 ug/kg/min via INTRAVENOUS
  Administered 2016-07-11: 40 ug/kg/min via INTRAVENOUS
  Filled 2016-07-10 (×6): qty 100

## 2016-07-10 MED ORDER — ALBUTEROL SULFATE (2.5 MG/3ML) 0.083% IN NEBU: 2.5000 mg | INHALATION_SOLUTION | RESPIRATORY_TRACT | Status: DC | PRN

## 2016-07-10 MED ORDER — HYDRALAZINE HCL 20 MG/ML IJ SOLN: 10.0000 mg | INTRAMUSCULAR | Status: DC | PRN

## 2016-07-10 MED ORDER — ASPIRIN 300 MG RE SUPP
300.0000 mg | RECTAL | Status: AC
  Administered 2016-07-10: 300 mg via RECTAL
  Filled 2016-07-10: qty 1

## 2016-07-10 MED ORDER — ORAL CARE MOUTH RINSE
15.0000 mL | Freq: Four times a day (QID) | OROMUCOSAL | Status: DC
  Administered 2016-07-10 – 2016-07-11 (×6): 15 mL via OROMUCOSAL

## 2016-07-10 MED ORDER — ALBUTEROL (5 MG/ML) CONTINUOUS INHALATION SOLN
INHALATION_SOLUTION | RESPIRATORY_TRACT | Status: AC
  Administered 2016-07-10: 20 mg via RESPIRATORY_TRACT
  Filled 2016-07-10: qty 20

## 2016-07-10 MED ORDER — METHYLPREDNISOLONE SODIUM SUCC 125 MG IJ SOLR
80.0000 mg | Freq: Two times a day (BID) | INTRAMUSCULAR | Status: DC
  Administered 2016-07-10 – 2016-07-11 (×3): 80 mg via INTRAVENOUS
  Filled 2016-07-10 (×3): qty 2

## 2016-07-10 MED ORDER — SODIUM CHLORIDE 0.9 % IV SOLN
250.0000 mL | INTRAVENOUS | Status: DC | PRN
  Administered 2016-07-11: 250 mL via INTRAVENOUS

## 2016-07-10 MED ORDER — CHLORHEXIDINE GLUCONATE 0.12% ORAL RINSE (MEDLINE KIT)
15.0000 mL | Freq: Two times a day (BID) | OROMUCOSAL | Status: DC
  Administered 2016-07-10 – 2016-07-11 (×3): 15 mL via OROMUCOSAL

## 2016-07-10 MED ORDER — ENOXAPARIN SODIUM 40 MG/0.4ML ~~LOC~~ SOLN
40.0000 mg | SUBCUTANEOUS | Status: DC
  Administered 2016-07-10 – 2016-07-13 (×4): 40 mg via SUBCUTANEOUS
  Filled 2016-07-10 (×4): qty 0.4

## 2016-07-10 MED ORDER — FAMOTIDINE IN NACL 20-0.9 MG/50ML-% IV SOLN
20.0000 mg | Freq: Two times a day (BID) | INTRAVENOUS | Status: DC
  Administered 2016-07-10 – 2016-07-11 (×3): 20 mg via INTRAVENOUS
  Filled 2016-07-10 (×3): qty 50

## 2016-07-10 MED ORDER — PROPOFOL 1000 MG/100ML IV EMUL
5.0000 ug/kg/min | INTRAVENOUS | Status: DC
  Administered 2016-07-10: 5 ug/kg/min via INTRAVENOUS

## 2016-07-10 MED ORDER — ONDANSETRON HCL 4 MG/2ML IJ SOLN: 4.0000 mg | Freq: Four times a day (QID) | INTRAMUSCULAR | Status: DC | PRN

## 2016-07-10 MED ORDER — ALBUTEROL (5 MG/ML) CONTINUOUS INHALATION SOLN
20.0000 mg | INHALATION_SOLUTION | RESPIRATORY_TRACT | Status: DC
  Administered 2016-07-10: 20 mg via RESPIRATORY_TRACT

## 2016-07-10 MED ORDER — ACETAMINOPHEN 325 MG PO TABS: 650.0000 mg | ORAL_TABLET | ORAL | Status: DC | PRN

## 2016-07-10 MED ORDER — FUROSEMIDE 10 MG/ML IJ SOLN
80.0000 mg | Freq: Once | INTRAMUSCULAR | Status: AC
  Administered 2016-07-10: 80 mg via INTRAVENOUS

## 2016-07-10 MED ORDER — ASPIRIN 81 MG PO CHEW
324.0000 mg | CHEWABLE_TABLET | ORAL | Status: AC
Start: 2016-07-10 — End: 2016-07-10

## 2016-07-10 MED ORDER — IPRATROPIUM-ALBUTEROL 0.5-2.5 (3) MG/3ML IN SOLN
3.0000 mL | RESPIRATORY_TRACT | Status: DC
  Administered 2016-07-10 – 2016-07-12 (×13): 3 mL via RESPIRATORY_TRACT
  Filled 2016-07-10 (×13): qty 3

## 2016-07-10 MED ORDER — FENTANYL CITRATE (PF) 100 MCG/2ML IJ SOLN
100.0000 ug | INTRAMUSCULAR | Status: DC | PRN
  Administered 2016-07-10 – 2016-07-11 (×7): 100 ug via INTRAVENOUS
  Filled 2016-07-10 (×8): qty 2

## 2016-07-10 MED ORDER — POTASSIUM CHLORIDE 20 MEQ/15ML (10%) PO SOLN
40.0000 meq | Freq: Once | ORAL | Status: AC
  Administered 2016-07-10: 40 meq
  Filled 2016-07-10: qty 30

## 2016-07-10 MED ORDER — ETOMIDATE 2 MG/ML IV SOLN
INTRAVENOUS | Status: AC | PRN
  Administered 2016-07-10: 20 mg via INTRAVENOUS

## 2016-07-10 MED ORDER — NITROGLYCERIN 2 % TD OINT
1.0000 [in_us] | TOPICAL_OINTMENT | Freq: Once | TRANSDERMAL | Status: AC
  Administered 2016-07-10: 1 [in_us] via TOPICAL
  Filled 2016-07-10: qty 1

## 2016-07-10 MED ORDER — INSULIN ASPART 100 UNIT/ML ~~LOC~~ SOLN
0.0000 [IU] | SUBCUTANEOUS | Status: DC
  Administered 2016-07-10 (×2): 1 [IU] via SUBCUTANEOUS

## 2016-07-10 MED ORDER — FUROSEMIDE 10 MG/ML IJ SOLN
INTRAMUSCULAR | Status: AC
  Filled 2016-07-10: qty 4

## 2016-07-10 MED ORDER — NICOTINE 14 MG/24HR TD PT24
14.0000 mg | MEDICATED_PATCH | Freq: Every day | TRANSDERMAL | Status: DC
  Administered 2016-07-10 – 2016-07-13 (×4): 14 mg via TRANSDERMAL
  Filled 2016-07-10 (×4): qty 1

## 2016-07-10 MED ORDER — ROCURONIUM BROMIDE 50 MG/5ML IV SOLN
INTRAVENOUS | Status: AC | PRN
  Administered 2016-07-10: 80 mg via INTRAVENOUS

## 2016-07-10 MED ORDER — LEVOFLOXACIN IN D5W 500 MG/100ML IV SOLN
500.0000 mg | INTRAVENOUS | Status: DC
  Administered 2016-07-10 – 2016-07-13 (×4): 500 mg via INTRAVENOUS
  Filled 2016-07-10 (×4): qty 100

## 2016-07-10 MED ORDER — FENTANYL CITRATE (PF) 100 MCG/2ML IJ SOLN
100.0000 ug | INTRAMUSCULAR | Status: DC | PRN
Start: 2016-07-10 — End: 2016-07-11
  Administered 2016-07-10: 100 ug via INTRAVENOUS
  Filled 2016-07-10: qty 2

## 2016-07-10 MED ORDER — PROPOFOL 1000 MG/100ML IV EMUL
INTRAVENOUS | Status: AC
  Filled 2016-07-10: qty 100

## 2016-07-10 MED ORDER — ALBUTEROL SULFATE (2.5 MG/3ML) 0.083% IN NEBU
5.0000 mg | INHALATION_SOLUTION | Freq: Once | RESPIRATORY_TRACT | Status: AC
  Administered 2016-07-10: 5 mg via RESPIRATORY_TRACT
  Filled 2016-07-10: qty 6

## 2016-07-10 NOTE — ED Notes (Signed)
Pt was intubated.   Per Dr. Nicanor Alconpalumbo the nitro paste was removed again

## 2016-07-10 NOTE — ED Notes (Signed)
Pt becomes more alert, sitting up in the bed and attempting to get out of bed, attempted to calm pt.

## 2016-07-10 NOTE — ED Provider Notes (Signed)
[facility]-EMERGENCY DEPT Provider Note   CSN: [id number] Arrival date & time: 07/10/16  0205  By signing my name below, I, Samantha Dyer, attest that this documentation has been prepared under the direction and in the presence of physician practitioner, Shafiq Larch, MD. Electronically Signed: Linna Dyer, Scribe. 07/10/2016. 2:10 AM.  History   Chief Complaint Chief Complaint  Patient presents with  . Respiratory Distress    The history is provided by the EMS personnel. The history is limited by the condition of the patient (respiratory distress). No language interpreter was used.  Shortness of Breath  This is a recurrent problem. The problem occurs continuously.The problem has been rapidly worsening. Associated symptoms include leg swelling. Pertinent negatives include no fever. The problem's precipitants include smoke. She has tried nothing for the symptoms. The treatment provided no relief. She has had prior hospitalizations. She has had prior ED visits. Associated medical issues include COPD. Associated medical issues do not include recent surgery.     HPI Comments: LEVEL 5 CAVEAT FOR ACUITY OF CONDITION Samantha Dyer is a 65 y.o. female brought in by EMS, with PMHx significant for COPD and chronic respiratory failure, who presents to the Emergency Department with respiratory distress beginning shortly PTA. EMS reports pt stood up to use the bathroom tonight and suddenly became severely short of breath. EMS reports pt's oxygen saturation was 96% on RA but slowly degraded and has not improved. Pt was admitted to the hospital 3 days ago for COPD exacerbation but left AMA yesterday. Her most recent chest x-ray from 07/07/16 reveals interstitial pulmonary edema and borderline cardiomegaly. Pt is a regular smoker. She did not use her inhaler PTA. No other complaints at this time.  Past Medical History:  Diagnosis Date  . Allergic rhinitis, cause unspecified   . Anemia, unspecified   .  Backache, unspecified   . COPD (chronic obstructive pulmonary disease) (HCC)   . Esophageal reflux   . Iron deficiency anemia, unspecified     Patient Active Problem List   Diagnosis Date Noted  . Hypomagnesemia 07/08/2016  . Hyponatremia 07/07/2016  . Acute respiratory failure with hypoxia (HCC) 07/07/2016  . COPD (chronic obstructive pulmonary disease) (HCC) 07/07/2016  . COPD exacerbation (HCC) 08/28/2015  . TOBACCO ABUSE 07/26/2010  . Chronic respiratory failure (HCC) 07/26/2010  . UNSPECIFIED IRON DEFICIENCY ANEMIA 07/25/2010  . UNSPECIFIED ANEMIA 07/25/2010  . ALLERGIC RHINITIS CAUSE UNSPECIFIED 07/25/2010  . C O P D 07/25/2010  . Esophageal reflux 07/25/2010  . UNSPECIFIED BACKACHE 07/25/2010    History reviewed. No pertinent surgical history.  OB History    No data available       Home Medications    Prior to Admission medications   Medication Sig Start Date End Date Taking? Authorizing Provider  BREO ELLIPTA 200-25 MCG/INH AEPB Inhale 1 puff into the lungs daily.  08/13/15  Yes Historical Provider, MD  COMBIVENT RESPIMAT 20-100 MCG/ACT AERS respimat Inhale 1 puff into the lungs every 6 (six) hours as needed for wheezing or shortness of breath.  06/09/16  Yes Historical Provider, MD  doxycycline (VIBRA-TABS) 100 MG tablet Take 1 tablet (100 mg total) by mouth every 12 (twelve) hours. 07/09/16  Yes Calvert Cantor, MD  esomeprazole (NEXIUM) 20 MG capsule Take 20 mg by mouth daily.    Yes Historical Provider, MD  guaiFENesin (MUCINEX) 600 MG 12 hr tablet Take 1 tablet (600 mg total) by mouth 2 (two) times daily as needed for to loosen phlegm. 07/09/16  Yes Saima  Rizwan, MD  ibuprofen (ADVIL,MOTRIN) 200 MG tablet Take 200 mg by mouth every 6 (six) hours as needed for moderate pain.    Yes Historical Provider, MD  ipratropium-albuterol (DUONEB) 0.5-2.5 (3) MG/3ML SOLN Inhale 3 mLs into the lungs every 4 (four) hours as needed (Shortness of breath).  09/02/15  Yes Historical  Provider, MD  nystatin (MYCOSTATIN) 100000 UNIT/ML suspension Take 5 mLs by mouth 4 (four) times daily.   Yes Historical Provider, MD  OXYGEN Inhale into the lungs continuous. 2 L   Yes Historical Provider, MD  tiotropium (SPIRIVA HANDIHALER) 18 MCG inhalation capsule daily. 03/15/16  Yes Historical Provider, MD  nicotine (NICODERM CQ - DOSED IN MG/24 HOURS) 21 mg/24hr patch Place 1 patch (21 mg total) onto the skin daily. Patient not taking: Reported on 07/10/2016 09/07/15   Osvaldo Shipper, MD  predniSONE (DELTASONE) 10 MG tablet Take 6 tablets (60 mg total) by mouth daily with breakfast. Taper by 10 mg daily until finished. 07/09/16   Calvert Cantor, MD  sodium chloride 1 g tablet Take 2 tablets (2 g total) by mouth 2 (two) times daily. 07/09/16   Calvert Cantor, MD    Family History Family History  Problem Relation Age of Onset  . Emphysema Mother   . Heart disease Mother   . Breast cancer Mother   . Pancreatic cancer Father     Social History Social History  Substance Use Topics  . Smoking status: Current Every Day Smoker    Packs/day: 1.00    Years: 43.00    Types: Cigarettes  . Smokeless tobacco: Never Used  . Alcohol use No     Allergies   Amoxicillin-pot clavulanate and Prednisone   Review of Systems Review of Systems  Unable to perform ROS: Acuity of condition  Constitutional: Negative for fever.  Respiratory: Positive for shortness of breath. Negative for stridor.   Cardiovascular: Positive for leg swelling.  Neurological: Negative for syncope.  All other systems reviewed and are negative.    Physical Exam Updated Vital Signs There were no vitals taken for this visit.  Physical Exam  Constitutional: She appears well-developed and well-nourished. She appears distressed.  HENT:  Head: Normocephalic and atraumatic.  Mouth/Throat: Oropharynx is clear and moist. No oropharyngeal exudate.  Eyes: Conjunctivae and EOM are normal. Pupils are equal, round, and  reactive to light. Right eye exhibits no discharge. Left eye exhibits no discharge. No scleral icterus.  Neck: Normal range of motion. Neck supple. No JVD present. No tracheal deviation present.  Trachea is midline. No stridor or carotid bruits.   Cardiovascular: Regular rhythm, normal heart sounds and intact distal pulses.  Tachycardia present.   No murmur heard. Pulmonary/Chest: Accessory muscle usage present. No stridor. Tachypnea noted. She is in respiratory distress. She has decreased breath sounds. She has no wheezes. She has no rales.  Abdominal: Soft. Bowel sounds are normal. She exhibits no distension. There is no tenderness. There is no rebound and no guarding.  Musculoskeletal: Normal range of motion. She exhibits edema. She exhibits no tenderness.  All compartments are soft. No palpable cords.   Lymphadenopathy:    She has no cervical adenopathy.  Neurological: She is alert. She has normal reflexes. She displays normal reflexes. She exhibits normal muscle tone.  Skin: Skin is warm and dry. Capillary refill takes less than 2 seconds.  Dermatitis to her back  Psychiatric: Her behavior is normal.  Nursing note and vitals reviewed.     ED Treatments / Results  Vitals:   07/10/16 0314 07/10/16 0315  BP: (!) 160/121 137/94  Pulse: (!) 130 (!) 128  Resp: 22 14    EKG Interpretation  Date/Time:  Monday July 10 2016 02:08:27 EST Ventricular Rate:  115 PR Interval:    QRS Duration: 96 QT Interval:  299 QTC Calculation: 414 R Axis:   67 Text Interpretation:  Sinus tachycardia Multiform ventricular premature complexes Left ventricular hypertrophy Confirmed by [facility] CareALUMBO-RASCH  MD, Morene AntuAPRIL ([id number]) on 07/10/2016 2:29:10 AM       Radiology No results found.  Procedures Procedure Name: Intubation Date/Time: 07/10/2016 3:27 AM Performed by: Cy BlamerPALUMBO, Deshonda Cryderman Pre-anesthesia Checklist: Patient identified, Suction available and Patient being monitored Oxygen Delivery Method:  Ambu bag Preoxygenation: Pre-oxygenation with 100% oxygen Intubation Type: Rapid sequence and Cricoid Pressure applied Ventilation: Mask ventilation without difficulty Laryngoscope Size: Glidescope and 4 Grade View: Grade I Tube size: 7.0 mm Number of attempts: 1 Airway Equipment and Method: Patient positioned with wedge pillow and Stylet Placement Confirmation: ETT inserted through vocal cords under direct vision,  Positive ETCO2,  CO2 detector and Breath sounds checked- equal and bilateral Secured at: 22 cm Tube secured with: ETT holder Dental Injury: Teeth and Oropharynx as per pre-operative assessment       Results for orders placed or performed during the hospital encounter of 07/10/16  CBC with Differential/Platelet  Result Value Ref Range   WBC 22.7 (H) 4.0 - 10.5 K/uL   RBC 5.10 3.87 - 5.11 MIL/uL   Hemoglobin 15.4 (H) 12.0 - 15.0 g/dL   HCT 60.445.8 54.036.0 - 98.146.0 %   MCV 89.8 78.0 - 100.0 fL   MCH 30.2 26.0 - 34.0 pg   MCHC 33.6 30.0 - 36.0 g/dL   RDW 19.113.5 47.811.5 - 29.515.5 %   Platelets 298 150 - 400 K/uL   Neutrophils Relative % 82 %   Neutro Abs 18.6 (H) 1.7 - 7.7 K/uL   Lymphocytes Relative 8 %   Lymphs Abs 1.8 0.7 - 4.0 K/uL   Monocytes Relative 10 %   Monocytes Absolute 2.3 (H) 0.1 - 1.0 K/uL   Eosinophils Relative 0 %   Eosinophils Absolute 0.0 0.0 - 0.7 K/uL   Basophils Relative 0 %   Basophils Absolute 0.0 0.0 - 0.1 K/uL  Comprehensive metabolic panel  Result Value Ref Range   Sodium 123 (L) 135 - 145 mmol/L   Potassium 4.9 3.5 - 5.1 mmol/L   Chloride 83 (L) 101 - 111 mmol/L   CO2 33 (H) 22 - 32 mmol/L   Glucose, Bld 220 (H) 65 - 99 mg/dL   BUN 14 6 - 20 mg/dL   Creatinine, Ser 6.210.73 0.44 - 1.00 mg/dL   Calcium 8.8 (L) 8.9 - 10.3 mg/dL   Total Protein 7.1 6.5 - 8.1 g/dL   Albumin 4.5 3.5 - 5.0 g/dL   AST 29 15 - 41 U/L   ALT 14 14 - 54 U/L   Alkaline Phosphatase 84 38 - 126 U/L   Total Bilirubin 0.9 0.3 - 1.2 mg/dL   GFR calc non Af Amer >60 >60 mL/min    GFR calc Af Amer >60 >60 mL/min   Anion gap 7 5 - 15  Brain natriuretic peptide  Result Value Ref Range   B Natriuretic Peptide 642.6 (H) 0.0 - 100.0 pg/mL  I-stat chem 8, ed  Result Value Ref Range   Sodium 121 (L) 135 - 145 mmol/L   Potassium 4.9 3.5 - 5.1 mmol/L   Chloride 83 (L) 101 -  111 mmol/L   BUN 14 6 - 20 mg/dL   Creatinine, Ser 1.610.90 0.44 - 1.00 mg/dL   Glucose, Bld 096215 (H) 65 - 99 mg/dL   Calcium, Ion 0.451.08 (L) 1.15 - 1.40 mmol/L   TCO2 32 0 - 100 mmol/L   Hemoglobin 17.0 (H) 12.0 - 15.0 g/dL   HCT 40.950.0 (H) 81.136.0 - 91.446.0 %  I-stat troponin, ED  Result Value Ref Range   Troponin i, poc 0.02 0.00 - 0.08 ng/mL   Comment 3           Dg Chest 2 View  Result Date: 07/07/2016 CLINICAL DATA:  65 y/o  F; 1 week of shortness of breath. EXAM: CHEST  2 VIEW COMPARISON:  07/07/2016 chest radiograph FINDINGS: Borderline cardiomegaly is stable. Aortic atherosclerosis with arch calcification. Small right and trace left pleural effusions. Interstitial pulmonary edema. No significant interval change from prior radiograph. Bones are unremarkable. IMPRESSION: Small bilateral effusions and interstitial pulmonary edema. Borderline cardiomegaly. No significant interval change. Electronically Signed   By: Mitzi HansenLance  Furusawa-Stratton M.D.   On: 07/07/2016 17:47   Dg Chest Portable 1 View  Result Date: 07/10/2016 CLINICAL DATA:  Acute onset of respiratory distress. Initial encounter. EXAM: PORTABLE CHEST 1 VIEW COMPARISON:  Chest radiograph performed 07/07/2016 FINDINGS: The lungs are well-aerated. Vascular congestion is noted. Increased interstitial markings may reflect mild interstitial edema. Small bilateral pleural effusions are noted. No pneumothorax is seen. The cardiomediastinal silhouette is mildly enlarged. No acute osseous abnormalities are seen. IMPRESSION: Vascular congestion and mild cardiomegaly. Increased interstitial markings may reflect mild interstitial edema. Small bilateral pleural  effusions noted. Electronically Signed   By: Roanna RaiderJeffery  Chang M.D.   On: 07/10/2016 02:32    MDM Number of Diagnoses or Management Options Acute pulmonary edema (HCC):  Acute respiratory failure with hypoxia (HCC):  COPD exacerbation [facility](HCC):  Respiratory distress:  Critical Care Total time providing critical care: 75-105 minutes MDM Reviewed: previous chart, nursing note and vitals Reviewed previous: labs and x-ray Interpretation: labs, ECG and x-ray (elevated WBC likely secondary to stress.  CXR with pulmonary edema cardiolmegaly and small effusions by me normal troponin.  Hypercapnea with acidemia CO2 is 78.9) Total time providing critical care: 75-105 minutes. This excludes time spent performing separately reportable procedures and services. Consults: critical care    Medications Ordered in ED Medications  propofol (DIPRIVAN) 1000 MG/100ML infusion (20 mcg/kg/min  70 kg Intravenous Rate/Dose Change 07/10/16 0329)  albuterol (PROVENTIL,VENTOLIN) solution continuous neb (20 mg Nebulization New Bag/Given 07/10/16 0300)  nitroGLYCERIN (NITROGLYN) 2 % ointment 1 inch (1 inch Topical Given 07/10/16 0218)  furosemide (LASIX) injection 80 mg ( Intravenous Not Given 07/10/16 0219)  albuterol (PROVENTIL) (2.5 MG/3ML) 0.083% nebulizer solution 5 mg (5 mg Nebulization Given 07/10/16 0226)  etomidate (AMIDATE) injection (20 mg Intravenous Given 07/10/16 0236)  rocuronium (ZEMURON) injection (80 mg Intravenous Given 07/10/16 0238)    CRITICAL CARE Performed by: Jasmine AwePALUMBO-RASCH,Sade Hollon K Total critical care time: 90 minutes Critical care time was exclusive of separately billable procedures and treating other patients. Critical care was necessary to treat or prevent imminent or life-threatening deterioration. Critical care was time spent personally by me on the following activities: development of treatment plan with patient and/or surrogate as well as nursing, discussions with consultants,  evaluation of patient's response to treatment, examination of patient, obtaining history from patient or surrogate, ordering and performing treatments and interventions, ordering and review of laboratory studies, ordering and review of radiographic studies, pulse oximetry and re-evaluation of patient's  condition.   Vent adjustments made based on ABG:  Increase RR to 22 from 14 to blow off more CO2 Final Clinical Impressions(s) / ED Diagnoses  Acute respiratory failure secondary to pulmonary edema and COPD:  Will admit to ICU        Alizandra Loh, MD 07/10/16 [phone]

## 2016-07-10 NOTE — Progress Notes (Signed)
PULMONARY / CRITICAL CARE MEDICINE   Name: Samantha Dyer MRN: [id number]  DOB: Sep 07, 1950    ADMISSION DATE:  07/10/2016 CONSULTATION DATE:  07/10/2016  REFERRING MD:  Dr. Daun PeacockPalombo  CHIEF COMPLAINT:  Shortness of breath   SUBJECTIVE:  Sitting up in bed. A little anxious but cooperative   VITAL SIGNS: BP (!) 98/51   Pulse 115   Temp (!) 96.2 F (35.7 C) (Axillary)   Resp (!) 22   Ht 5\' 4"  (1.626 m)   Wt 160 lb 0.9 oz (72.6 kg)   SpO2 94%   BMI 27.47 kg/m   HEMODYNAMICS:    VENTILATOR SETTINGS: Vent Mode: PRVC FiO2 (%):  [40 %-100 %] 40 % Set Rate:  [14 bmp-22 bmp] 22 bmp Vt Set:  [440 mL] 440 mL PEEP:  [5 cmH20-8 cmH20] 5 cmH20 Plateau Pressure:  [17 cmH20-26 cmH20] 17 cmH20  INTAKE / OUTPUT: I/O last 3 completed shifts: In: 144.7 [I.V.:44.7; IV Piggyback:100] Out: 775 [Urine:775]  PHYSICAL EXAMINATION: General:  Anxious, sitting up in bed.  Neuro:  PERRL, no focal deficits HEENT:  ETT, No thyromegaly, JVD Cardiovascular:  RRR, no MRG Lungs:  Prolonged exhale phase. + wheeze and rhonchi. + accessory use and air trapping w/ autopeep on SBT Abdomen:  Obese, soft, + BS Musculoskeletal:  Normal tone and bulk Skin:  Intact  LABS:  BMET  Recent Labs Lab 07/09/16 1403 07/10/16 0215 07/10/16 0245 07/10/16 0538  NA 124* 123* 121* 125*  K 4.4 4.9 4.9 3.3*  CL 83* 83* 83* 82*  CO2 32 33*  --  30  BUN 12 14 14 17   CREATININE 0.73 0.73 0.90 0.96  GLUCOSE 133* 220* 215* 219*    Electrolytes  Recent Labs Lab 07/07/16 2037 07/08/16 0221 07/09/16 0506  07/09/16 1403 07/10/16 0215 07/10/16 0538  CALCIUM  --  8.8* 8.8*  < > 8.8* 8.8* 8.5*  MG 1.6* 1.5* 2.2  --   --   --   --   PHOS  --  3.6  --   --   --   --   --   < > = values in this interval not displayed.  CBC  Recent Labs Lab 07/08/16 0221 07/10/16 0215 07/10/16 0245 07/10/16 0538  WBC 5.8 22.7*  --  16.9*  HGB 13.8 15.4* 17.0* 14.1  HCT 42.2 45.8 50.0* 41.5  PLT 164 298  --  152     Coag's No results for input(s): APTT, INR in the last 168 hours.  Sepsis Markers  Recent Labs Lab 07/10/16 0538  PROCALCITON 0.25    ABG  Recent Labs Lab 07/10/16 0320 07/10/16 0618  PHART 7.242* 7.390  PCO2ART 78.9* 50.0*  PO2ART ABOVE REPORTABLE RANGE 137*    Liver Enzymes  Recent Labs Lab 07/07/16 1700 07/08/16 0221 07/10/16 0215  AST 17 25 29   ALT 10* 8* 14  ALKPHOS 84 81 84  BILITOT 1.1 1.2 0.9  ALBUMIN 4.1 3.6 4.5    Cardiac Enzymes  Recent Labs Lab 07/07/16 2037 07/08/16 0221 07/08/16 0758  TROPONINI <0.03 <0.03 <0.03    Glucose No results for input(s): GLUCAP in the last 168 hours.  Imaging Dg Chest Portable 1 View  Result Date: 07/10/2016 CLINICAL DATA:  Post intubation EXAM: PORTABLE CHEST 1 VIEW COMPARISON:  07/10/2016 FINDINGS: Interval placement of an endotracheal tube with tip measuring 4.9 cm above the carina. Enteric tube tip is off the field of view but below the left hemidiaphragm. Heart size is normal.  Mild diffuse interstitial pattern to the lungs may indicate edema. Blunting of the costophrenic angles suggests small effusions. No focal consolidation. No pneumothorax. Calcification of the aorta. IMPRESSION: Endotracheal tube tip measures 4.9 cm above the carina. Diffuse interstitial pattern to the lungs. Electronically Signed   By: Burman NievesWilliam  Stevens M.D.   On: 07/10/2016 03:38   Dg Chest Portable 1 View  Result Date: 07/10/2016 CLINICAL DATA:  Acute onset of respiratory distress. Initial encounter. EXAM: PORTABLE CHEST 1 VIEW COMPARISON:  Chest radiograph performed 07/07/2016 FINDINGS: The lungs are well-aerated. Vascular congestion is noted. Increased interstitial markings may reflect mild interstitial edema. Small bilateral pleural effusions are noted. No pneumothorax is seen. The cardiomediastinal silhouette is mildly enlarged. No acute osseous abnormalities are seen. IMPRESSION: Vascular congestion and mild cardiomegaly. Increased  interstitial markings may reflect mild interstitial edema. Small bilateral pleural effusions noted. Electronically Signed   By: Roanna RaiderJeffery  Chang M.D.   On: 07/10/2016 02:32    STUDIES:  Echocardiogram 07/08/2016 showed grade 1 diastolic dysfunction with normal LV size and function  CULTURES: 07/10/2016 respiratory culture>>>   ANTIBIOTICS:  Levaquin 11/20>   SIGNIFICANT EVENTS:   LINES/TUBES: 07/10/2016> ETT   DISCUSSION:  65 year old female with a past medical history significant for severe COPD was just admitted for a COPD exacerbation returned early in the morning on 07/10/2016 with acute respiratory failure with hypoxemia. Intubated and now requiring mechanical ventilatory support.  Treating as AECOPD. Has sig air trapping on exam today. For today plan: cont supportive care: steroids, scheduled BDs, empiric abx and allow some rest w/ goal RAS of -2 to -3. Hopefully vent mechanics should allow for weaning in am.    ASSESSMENT / PLAN:  PULMONARY A: Acute respiratory failure with hypoxemia Acute exacerbation of COPD P:   Full mechanical ventilator support Ventilator associated pneumonia prevention protocol Cont Solu-Medrol 80 mg IV every 8 hours DuoNeb every 4 hours As needed albuterol Repeat chest x-ray in morning PAD protocol: goal - 2 to -3 Follow ABG.  CARDIOVASCULAR A:  Possible CHF exacerbation  Hypertension P:  Monitor urine output Telemetry monitoring Monitor BP closely, consider adding ACE-In if BP still elevated PRN hydralazine for now  RENAL A:   Hypokalemia  Hyponatremia  P:   Monitor BMET and UOP Replace electrolytes as needed   GASTROINTESTINAL A:   No acute issues P:   Place OG tube Famotidine for stress ulcer prophylaxis  HEMATOLOGIC A:   No acute issues P:  Monitor for bleeding  INFECTIOUS A:   Severe COPD with exacerbation, at risk for Pseudomonas colonization/infection P:   Respiratory culture IV Levaquin  ENDOCRINE A:    Hyperglycemia  P:   Monitor glucose ssi   NEUROLOGIC A:   Acute encephalopathy in the setting of severe respiratory failure with hypoxemia Sedation needs for vent synchrony P:   RASS goal -2 to -3 propofol drip, fentanyl when necessary per pain agitation protocol   FAMILY  - Updates: No family at bediside - Inter-disciplinary family meet or Palliative Care meeting due by:  11/27.  Critical care time- 30 minutes.   Simonne MartinetPeter E Twylla Arceneaux ACNP-BC [facility] Pulmonary/Critical Care Pager # [phone] OR # [phone] if no answer  07/10/2016, 9:54 AM

## 2016-07-10 NOTE — Care Management Note (Signed)
Case Management Note  Patient Details  Name: Samantha Dyer MRN: [id number] Date of Birth: 1951-06-15  Subjective/Objective:      resp failure requiring ventilator              Action/Plan: from home Date:  July 10, 2016 Chart reviewed for concurrent status and case management needs. Will continue to follow patient progress. Discharge Planning: following for needs Expected discharge date: [id number] Marcelle SmilingRhonda Davis, BSN, [place]3, [place]M   [phone]  Expected Discharge Date:                  Expected Discharge Plan:  Home/Self Care  In-House Referral:     Discharge planning Services     Post Acute Care Choice:    Choice offered to:     DME Arranged:    DME Agency:     HH Arranged:    HH Agency:     Status of Service:  In process, will continue to follow  If discussed at Long Length of Stay Meetings, dates discussed:    Additional Comments:  [name], Rhonda Lynn, RN 07/10/2016, 9:33 AM

## 2016-07-10 NOTE — Progress Notes (Signed)
Pt transported from ED to ICU1227 on vent 100% fio2.  Pt tolerated transport well without incident.

## 2016-07-10 NOTE — H&P (Addendum)
PULMONARY / CRITICAL CARE MEDICINE   Name: Samantha Dyer MRN: [id number]  DOB: 1951/06/26    ADMISSION DATE:  07/10/2016 CONSULTATION DATE:  07/10/2016  REFERRING MD:  Dr. Daun Peacock  CHIEF COMPLAINT:  Shortness of breath  HISTORY OF PRESENT ILLNESS:   65 year old female with a past medical history significant for severe COPD, active smoker who was just hospitalized at [facility] for an exacerbation of COPD and left AMA on 07/09/2016. She returned 07/10/2016 acutely ill and respiratory failure. She was brought in by EMS and was noted to be hypoxemic. In the emergency department she was treated with BiPAP but unfortunately she continued to have significant desaturation of oxygen so she required intubation. No further history could be obtained due to patient intubation. She has received 80 mg lasix, nitropaste, 125 mg solumedrol and 2 gm Mg so far. RR increased on vent due to hypercarbia on ABG.  PAST MEDICAL HISTORY :  She  has a past medical history of Allergic rhinitis, cause unspecified; Anemia, unspecified; Backache, unspecified; COPD (chronic obstructive pulmonary disease) (HCC); Esophageal reflux; and Iron deficiency anemia, unspecified.  PAST SURGICAL HISTORY: She  has no past surgical history on file.  Allergies  Allergen Reactions  . Amoxicillin-Pot Clavulanate Swelling  . Prednisone Other (See Comments)    "drives her crazy"    No current facility-administered medications on file prior to encounter.    Current Outpatient Prescriptions on File Prior to Encounter  Medication Sig  . BREO ELLIPTA 200-25 MCG/INH AEPB Inhale 1 puff into the lungs daily.   . COMBIVENT RESPIMAT 20-100 MCG/ACT AERS respimat Inhale 1 puff into the lungs every 6 (six) hours as needed for wheezing or shortness of breath.   . doxycycline (VIBRA-TABS) 100 MG tablet Take 1 tablet (100 mg total) by mouth every 12 (twelve) hours.  Marland Kitchen esomeprazole (NEXIUM) 20 MG capsule Take 20 mg by mouth daily.    Marland Kitchen guaiFENesin (MUCINEX) 600 MG 12 hr tablet Take 1 tablet (600 mg total) by mouth 2 (two) times daily as needed for to loosen phlegm.  Marland Kitchen ibuprofen (ADVIL,MOTRIN) 200 MG tablet Take 200 mg by mouth every 6 (six) hours as needed for moderate pain.   Marland Kitchen ipratropium-albuterol (DUONEB) 0.5-2.5 (3) MG/3ML SOLN Inhale 3 mLs into the lungs every 4 (four) hours as needed (Shortness of breath).   . nystatin (MYCOSTATIN) 100000 UNIT/ML suspension Take 5 mLs by mouth 4 (four) times daily.  . OXYGEN Inhale into the lungs continuous. 2 L  . tiotropium (SPIRIVA HANDIHALER) 18 MCG inhalation capsule daily.  . nicotine (NICODERM CQ - DOSED IN MG/24 HOURS) 21 mg/24hr patch Place 1 patch (21 mg total) onto the skin daily. (Patient not taking: Reported on 07/10/2016)  . predniSONE (DELTASONE) 10 MG tablet Take 6 tablets (60 mg total) by mouth daily with breakfast. Taper by 10 mg daily until finished.  . sodium chloride 1 g tablet Take 2 tablets (2 g total) by mouth 2 (two) times daily.    FAMILY HISTORY:  Her indicated that the status of her mother is unknown. She indicated that the status of her father is unknown.    SOCIAL HISTORY: She  reports that she has been smoking Cigarettes.  She has a 43.00 pack-year smoking history. She has never used smokeless tobacco. She reports that she does not drink alcohol.  REVIEW OF SYSTEMS:   Cannot obtain due to intubation  SUBJECTIVE:  As above  VITAL SIGNS: BP 137/94   Pulse (!) 128  Temp 97.4 F (36.3 C) (Oral)   Resp 14   Ht 5\' 4"  (1.626 m)   SpO2 100%   BMI 26.49 kg/m   HEMODYNAMICS:    VENTILATOR SETTINGS: Vent Mode: PRVC FiO2 (%):  [100 %] 100 % Set Rate:  [14 bmp-15 bmp] 14 bmp Vt Set:  [440 mL] 440 mL PEEP:  [5 cmH20-8 cmH20] 5 cmH20 Plateau Pressure:  [24 cmH20] 24 cmH20  INTAKE / OUTPUT: No intake/output data recorded.  PHYSICAL EXAMINATION: General:  Sedated, No distress Neuro:  PERRL, no focal deficits HEENT:  ETT, No thyromegaly,  JVD Cardiovascular:  RRR, no MRG Lungs:  Clear, no wheeze Abdomen:  Obese, soft, + BS Musculoskeletal:  Normal tone and bulk Skin:  Intact  LABS:  BMET  Recent Labs Lab 07/09/16 0820 07/09/16 1403 07/10/16 0215 07/10/16 0245  NA 127* 124* 123* 121*  K 4.6 4.4 4.9 4.9  CL 83* 83* 83* 83*  CO2 33* 32 33*  --   BUN 12 12 14 14   CREATININE 0.82 0.73 0.73 0.90  GLUCOSE 166* 133* 220* 215*    Electrolytes  Recent Labs Lab 07/07/16 2037 07/08/16 0221 07/09/16 0506 07/09/16 0820 07/09/16 1403 07/10/16 0215  CALCIUM  --  8.8* 8.8* 9.0 8.8* 8.8*  MG 1.6* 1.5* 2.2  --   --   --   PHOS  --  3.6  --   --   --   --     CBC  Recent Labs Lab 07/07/16 1700 07/08/16 0221 07/10/16 0215 07/10/16 0245  WBC 7.6 5.8 22.7*  --   HGB 13.6 13.8 15.4* 17.0*  HCT 41.5 42.2 45.8 50.0*  PLT 169 164 298  --     Coag's No results for input(s): APTT, INR in the last 168 hours.  Sepsis Markers No results for input(s): LATICACIDVEN, PROCALCITON, O2SATVEN in the last 168 hours.  ABG No results for input(s): PHART, PCO2ART, PO2ART in the last 168 hours.  Liver Enzymes  Recent Labs Lab 07/07/16 1700 07/08/16 0221 07/10/16 0215  AST 17 25 29   ALT 10* 8* 14  ALKPHOS 84 81 84  BILITOT 1.1 1.2 0.9  ALBUMIN 4.1 3.6 4.5    Cardiac Enzymes  Recent Labs Lab 07/07/16 2037 07/08/16 0221 07/08/16 0758  TROPONINI <0.03 <0.03 <0.03    Glucose No results for input(s): GLUCAP in the last 168 hours.  Imaging Dg Chest Portable 1 View  Result Date: 07/10/2016 CLINICAL DATA:  Acute onset of respiratory distress. Initial encounter. EXAM: PORTABLE CHEST 1 VIEW COMPARISON:  Chest radiograph performed 07/07/2016 FINDINGS: The lungs are well-aerated. Vascular congestion is noted. Increased interstitial markings may reflect mild interstitial edema. Small bilateral pleural effusions are noted. No pneumothorax is seen. The cardiomediastinal silhouette is mildly enlarged. No acute  osseous abnormalities are seen. IMPRESSION: Vascular congestion and mild cardiomegaly. Increased interstitial markings may reflect mild interstitial edema. Small bilateral pleural effusions noted. Electronically Signed   By: Roanna RaiderJeffery  Chang M.D.   On: 07/10/2016 02:32    STUDIES:  Echocardiogram 07/08/2016 showed grade 1 diastolic dysfunction with normal LV size and function  CULTURES: 07/10/2016 respiratory culture   ANTIBIOTICS:  Levaquin 11/20>   SIGNIFICANT EVENTS:   LINES/TUBES: 07/10/2016> ETT   DISCUSSION:  65 year old female with a past medical history significant for severe COPD was just admitted for a COPD exacerbation returned early in the morning on 07/10/2016 with acute respiratory failure with hypoxemia. Intubated and now requiring mechanical ventilatory support. Differential diagnosis includes persistent COPD exacerbation  versus CHF exacerbation or less likely a pulmonary embolism (D dimer is negative on 11/17). Will be treated empirically for a COPD exacerbation and admitted to the intensive care unit.  ASSESSMENT / PLAN:  PULMONARY A: Acute respiratory failure with hypoxemia Acute exacerbation of COPD P:   Full mechanical ventilator support Ventilator associated pneumonia prevention protocol Solu-Medrol 80 mg IV every 8 hours DuoNeb every 4 hours As needed albuterol Repeat chest x-ray in morning Follow ABG.  CARDIOVASCULAR A:  Possible CHF exacerbation  Hypertension P:  Lasix 1 dose given in the ER Monitor urine output Telemetry monitoring Monitor BP closely, consider adding ACE-In if BP still elevated PRN hydralazine for now  RENAL A:   No acute issues P:   Monitor BMET and UOP Replace electrolytes as needed   GASTROINTESTINAL A:   No acute issues P:   Place OG tube Famotidine for stress ulcer prophylaxis  HEMATOLOGIC A:   No acute issues P:  Monitor for bleeding  INFECTIOUS A:   Severe COPD with exacerbation, at risk for  Pseudomonas colonization/infection P:   Respiratory culture IV Levaquin  ENDOCRINE A:   No acute issues   P:   Monitor glucose  NEUROLOGIC A:   Acute encephalopathy in the setting of severe respiratory failure with hypoxemia Sedation needs for vent synchrony P:   RASS goal -2 propofol drip, fentanyl when necessary per pain agitation protocol   FAMILY  - Updates: No family at bediside - Inter-disciplinary family meet or Palliative Care meeting due by:  11/27.  Critical care time- 35 mins.  Chilton GreathousePraveen Stasha Naraine MD [facility] Pager [phone] If no answer or after 3pm call: [phone] 07/10/2016, 4:13 AM

## 2016-07-10 NOTE — Progress Notes (Signed)
Inpatient Diabetes Program Recommendations  AACE/ADA: New Consensus Statement on Inpatient Glycemic Control (2015)  Target Ranges:  Prepandial:   less than 140 mg/dL      Peak postprandial:   less than 180 mg/dL (1-2 hours)      Critically ill patients:  140 - 180 mg/dL   Results for Samantha Dyer, Samantha Dyer (MRN [id number]) as of 07/10/2016 08:07  Ref. Range 07/10/2016 02:15 07/10/2016 02:45 07/10/2016 05:38  Glucose Latest Ref Range: 65 - 99 mg/dL 409220 (H) 811215 (H) 914219 (H)    Admit with: SOB  History: COPD     -Note patient currently Intubated.  -Receiving IV Solumedrol 80 mg BID.  -Lab glucose levels >200 mg/dl.      MD- Please consider starting Phase 1 of the ICU Glycemic Control Protocol- Novolog Correction scale/ SSI Q4 hours      --Will follow patient during hospitalization--  Ambrose FinlandJeannine Johnston Delany Steury RN, MSN, CDE Diabetes Coordinator Inpatient Glycemic Control Team Team Pager: [phone] (8a-5p)

## 2016-07-10 NOTE — Progress Notes (Signed)
Initial Nutrition Assessment  DOCUMENTATION CODES:   Not applicable  INTERVENTION:  - If pt to remain intubated >/= 24 hours, recommend Vital High Protein @ 35 mL/hr with 30 mL Prostat BID. This regimen + kcal from Propofol will provide 1484 kcal (102% estimated kcal need), 104 grams of protein, and 702 mL free water.  - Free water per MD/NP given hyponatremia and hypochloridemia.  - RD will follow-up 11/21.  NUTRITION DIAGNOSIS:   Inadequate oral intake related to inability to eat as evidenced by NPO status.  GOAL:   Patient will meet greater than or equal to 90% of their needs  MONITOR:   Vent status, Weight trends, Labs, I & O's  REASON FOR ASSESSMENT:   Ventilator  ASSESSMENT:   65 year old female with a past medical history significant for severe COPD, active smoker who was just hospitalized at [facility] for an exacerbation of COPD and left AMA on 07/09/2016. She returned 07/10/2016 acutely ill and respiratory failure. She was brought in by EMS and was noted to be hypoxemic. In the emergency department she was treated with BiPAP but unfortunately she continued to have significant desaturation of oxygen so she required intubation. No further history could be obtained due to patient intubation.  Pt seen for new vent. BMI indicates overweight status. Pt left AMA from ED yesterday PM and returned overnight and was intubated shortly after that time. No family/visitors present at this time. OGT now in place. CCM NP note from earlier this AM reviewed.  Physical assessment shows no muscle or fat wasting, no edema at this time. Per chart review, weight has been stable since January 2017; will monitor weight trends during hospitalization.   Patient is currently intubated on ventilator support MV: 10.2 L/min Temp (24hrs), Avg:96.7 F (35.9 C), Min:95.5 F (35.3 C), Max:98.1 F (36.7 C) Propofol: 16.8 ml/hr (444 kcal). BP: 121/56 and MAP: 75.  Medications reviewed; 20  mg IV Pepcid BID, 80 mg IV Lasix x1 dose today, sliding scale Novolog, 80 mg IV Solu-medrol BID, PRN IV Zofran, 40 mEq KCl per OGT x1 dose today. Labs reviewed; Na: 125 mmol/L, Cl: 82 mmol/L, K: 3.3 mmol/L, Ca: 8.5 mg/dL, ionized Ca: 8.111.08 mg/dL.  Drip: Propofol @ 40 mcg/kg/min.    Diet Order:  Diet NPO time specified  Skin:  Reviewed, no issues  Last BM:  11/19  Height:   Ht Readings from Last 1 Encounters:  07/10/16 5\' 4"  (1.626 m)    Weight:   Wt Readings from Last 1 Encounters:  07/10/16 160 lb 0.9 oz (72.6 kg)    Ideal Body Weight:  54.54 kg  BMI:  Body mass index is 27.47 kg/m.  Estimated Nutritional Needs:   Kcal:  1448  Protein:  87-109 grams (1.2-1.5 grams/kg)  Fluid:  per MD/NP given hyponatremia and hypochloridemia  EDUCATION NEEDS:   No education needs identified at this time    Trenton GammonJessica Kyion Gautier, MS, RD, LDN Inpatient Clinical Dietitian Pager # [phone] After hours/weekend pager # [phone]

## 2016-07-10 NOTE — ED Triage Notes (Signed)
Pt here by ems on cpap due to respiratory distress.  Pt with hx of copd, was given 2g mag and 125mg  solumedrol pta.  Pt was also given albuterol breathing treatment.  EDP to room.

## 2016-07-10 NOTE — ED Notes (Signed)
Report was given to ICU

## 2016-07-10 NOTE — Sedation Documentation (Signed)
Pt was intubated by Dr. Nicanor AlconPalumbo with 7.0 22cm at the lip, confirmed with co2 color change.  Pt tolerated this well

## 2016-07-10 NOTE — ED Notes (Signed)
HOB up 30 degrees. 

## 2016-07-10 NOTE — Telephone Encounter (Signed)
Patient is currently admitted. Will hold message in triage to follow up on and schedule rov after patient is discharged.

## 2016-07-11 ENCOUNTER — Inpatient Hospital Stay (HOSPITAL_COMMUNITY): Payer: 59

## 2016-07-11 DIAGNOSIS — E871 Hypo-osmolality and hyponatremia: Secondary | ICD-10-CM

## 2016-07-11 DIAGNOSIS — J441 Chronic obstructive pulmonary disease with (acute) exacerbation: Secondary | ICD-10-CM

## 2016-07-11 DIAGNOSIS — J9602 Acute respiratory failure with hypercapnia: Secondary | ICD-10-CM

## 2016-07-11 LAB — CBC
HEMATOCRIT: 36.8 % (ref 36.0–46.0)
Hemoglobin: 12.9 g/dL (ref 12.0–15.0)
MCH: 30 pg (ref 26.0–34.0)
MCHC: 35.1 g/dL (ref 30.0–36.0)
MCV: 85.6 fL (ref 78.0–100.0)
Platelets: 127 10*3/uL — ABNORMAL LOW (ref 150–400)
RBC: 4.3 MIL/uL (ref 3.87–5.11)
RDW: 13.6 % (ref 11.5–15.5)
WBC: 8.4 10*3/uL (ref 4.0–10.5)

## 2016-07-11 LAB — LEGIONELLA PNEUMOPHILA SEROGP 1 UR AG: L. pneumophila Serogp 1 Ur Ag: NEGATIVE

## 2016-07-11 LAB — GLUCOSE, CAPILLARY
GLUCOSE-CAPILLARY: 118 mg/dL — AB (ref 65–99)
GLUCOSE-CAPILLARY: 109 mg/dL — AB (ref 65–99)
Glucose-Capillary: 119 mg/dL — ABNORMAL HIGH (ref 65–99)
Glucose-Capillary: 111 mg/dL — ABNORMAL HIGH (ref 65–99)
Glucose-Capillary: 121 mg/dL — ABNORMAL HIGH (ref 65–99)

## 2016-07-11 LAB — COMPREHENSIVE METABOLIC PANEL
ALK PHOS: 57 U/L (ref 38–126)
ALT: 14 U/L (ref 14–54)
AST: 19 U/L (ref 15–41)
Albumin: 3.3 g/dL — ABNORMAL LOW (ref 3.5–5.0)
Anion gap: 8 (ref 5–15)
BILIRUBIN TOTAL: 1.1 mg/dL (ref 0.3–1.2)
BUN: 19 mg/dL (ref 6–20)
CALCIUM: 8.6 mg/dL — AB (ref 8.9–10.3)
CO2: 32 mmol/L (ref 22–32)
CREATININE: 0.91 mg/dL (ref 0.44–1.00)
Chloride: 84 mmol/L — ABNORMAL LOW (ref 101–111)
Glucose, Bld: 120 mg/dL — ABNORMAL HIGH (ref 65–99)
Potassium: 4.2 mmol/L (ref 3.5–5.1)
Sodium: 124 mmol/L — ABNORMAL LOW (ref 135–145)
TOTAL PROTEIN: 5.5 g/dL — AB (ref 6.5–8.1)

## 2016-07-11 LAB — MAGNESIUM: MAGNESIUM: 2.1 mg/dL (ref 1.7–2.4)

## 2016-07-11 LAB — PHOSPHORUS: Phosphorus: 3.1 mg/dL (ref 2.5–4.6)

## 2016-07-11 MED ORDER — INSULIN ASPART 100 UNIT/ML ~~LOC~~ SOLN: 0.0000 [IU] | Freq: Every day | SUBCUTANEOUS | Status: DC

## 2016-07-11 MED ORDER — METHYLPREDNISOLONE SODIUM SUCC 40 MG IJ SOLR
40.0000 mg | Freq: Two times a day (BID) | INTRAMUSCULAR | Status: DC
  Administered 2016-07-11 – 2016-07-12 (×2): 40 mg via INTRAVENOUS
  Filled 2016-07-11 (×2): qty 1

## 2016-07-11 MED ORDER — POTASSIUM CHLORIDE CRYS ER 20 MEQ PO TBCR
40.0000 meq | EXTENDED_RELEASE_TABLET | Freq: Once | ORAL | Status: AC
  Administered 2016-07-11: 40 meq via ORAL
  Filled 2016-07-11: qty 2

## 2016-07-11 MED ORDER — INSULIN ASPART 100 UNIT/ML ~~LOC~~ SOLN
0.0000 [IU] | Freq: Three times a day (TID) | SUBCUTANEOUS | Status: DC
  Administered 2016-07-12: 1 [IU] via SUBCUTANEOUS

## 2016-07-11 MED ORDER — FUROSEMIDE 10 MG/ML IJ SOLN
40.0000 mg | Freq: Once | INTRAMUSCULAR | Status: AC
  Administered 2016-07-11: 40 mg via INTRAVENOUS
  Filled 2016-07-11: qty 4

## 2016-07-11 MED ORDER — ORAL CARE MOUTH RINSE: 15.0000 mL | Freq: Two times a day (BID) | OROMUCOSAL | Status: DC

## 2016-07-11 MED ORDER — POTASSIUM CHLORIDE 20 MEQ/15ML (10%) PO SOLN
40.0000 meq | Freq: Once | ORAL | Status: DC
  Filled 2016-07-11: qty 30

## 2016-07-11 NOTE — Progress Notes (Signed)
Spontaneous Breathing Trial Note: Nurse reports after 1 hour of being on spontaneous breathing trial with pressure support 5/5 patient had coughing spell resulting in tachycardia and hypertension. Patient transitioned to full vent support briefly. Just transitioned back to pressure support 0/5 with tidal volume greater than 900 mL. Suspect patient may be able to extubate and the endotracheal tube is likely precipitating some of the patient's cough. Checking for cuff leak and extubating nasal cannula with goal saturation 88-94% if present.  Donna ChristenJennings E. Jamison NeighborNestor, M.D. [facility] Pager:  [phone] After 3pm or if no response, call [phone] 9:14 AM 07/11/16

## 2016-07-11 NOTE — Progress Notes (Signed)
PULMONARY / CRITICAL CARE MEDICINE   Name: Samantha Dyer MRN: [id number]  DOB: 09-Jan-1951    ADMISSION DATE:  07/10/2016 CONSULTATION DATE:  07/10/2016  REFERRING MD:  Dr. Daun PeacockPalombo  CHIEF COMPLAINT:  Shortness of breath   SUBJECTIVE:  Sitting up in bed. A little anxious but cooperative; looks good on SBT   VITAL SIGNS: BP (!) 162/74 (BP Location: Right Arm)   Pulse 115   Temp 97.8 F (36.6 C) (Axillary)   Resp (!) 22   Ht 5\' 4"  (1.626 m)   Wt 161 lb 2.5 oz (73.1 kg)   SpO2 97%   BMI 27.66 kg/m  2 liters     VENTILATOR SETTINGS: Vent Mode: PRVC FiO2 (%):  [30 %] 30 % Set Rate:  [22 bmp] 22 bmp Vt Set:  [440 mL] 440 mL PEEP:  [5 cmH20] 5 cmH20 Plateau Pressure:  [18 cmH20-21 cmH20] 18 cmH20  INTAKE / OUTPUT: I/O last 3 completed shifts: In: 778.4 [I.V.:478.4; IV Piggyback:300] Out: 4098 [[id number] [Urine:1645; Emesis/NG output:100]  PHYSICAL EXAMINATION: General:  Calm, sitting up in bed.  Neuro:  PERRL, no focal deficits HEENT:  ETT, No thyromegaly, JVD Cardiovascular:  RRR, no MRG Lungs:  Clear to auscultation, symmetrical chest movement, no wheeze or accessory use  Abdomen:  Obese, soft, + BS Musculoskeletal:  Normal tone and bulk Skin:  Intact  LABS:  BMET  Recent Labs Lab 07/10/16 0215 07/10/16 0245 07/10/16 0538 07/11/16 0311  NA 123* 121* 125* 124*  K 4.9 4.9 3.3* 4.2  CL 83* 83* 82* 84*  CO2 33*  --  30 32  BUN 14 14 17 19   CREATININE 0.73 0.90 0.96 0.91  GLUCOSE 220* 215* 219* 120*    Electrolytes  Recent Labs Lab 07/08/16 0221 07/09/16 0506  07/10/16 0215 07/10/16 0538 07/11/16 0311  CALCIUM 8.8* 8.8*  < > 8.8* 8.5* 8.6*  MG 1.5* 2.2  --   --   --  2.1  PHOS 3.6  --   --   --   --  3.1  < > = values in this interval not displayed.  CBC  Recent Labs Lab 07/10/16 0215 07/10/16 0245 07/10/16 0538 07/11/16 0311  WBC 22.7*  --  16.9* 8.4  HGB 15.4* 17.0* 14.1 12.9  HCT 45.8 50.0* 41.5 36.8  PLT 298  --  152 127*     Coag's No results for input(s): APTT, INR in the last 168 hours.  Sepsis Markers  Recent Labs Lab 07/10/16 0538  PROCALCITON 0.25    ABG  Recent Labs Lab 07/10/16 0320 07/10/16 0618  PHART 7.242* 7.390  PCO2ART 78.9* 50.0*  PO2ART ABOVE REPORTABLE RANGE 137*    Liver Enzymes  Recent Labs Lab 07/08/16 0221 07/10/16 0215 07/11/16 0311  AST 25 29 19   ALT 8* 14 14  ALKPHOS 81 84 57  BILITOT 1.2 0.9 1.1  ALBUMIN 3.6 4.5 3.3*    Cardiac Enzymes  Recent Labs Lab 07/07/16 2037 07/08/16 0221 07/08/16 0758  TROPONINI <0.03 <0.03 <0.03    Glucose  Recent Labs Lab 07/10/16 1136 07/10/16 1533 07/10/16 1920 07/10/16 2324 07/11/16 0319 07/11/16 0753  GLUCAP 113* 115* 126* 131* 119* 118*    Imaging Portable Chest Xray  Result Date: 07/11/2016 CLINICAL DATA:  Respiratory failure.  Intubated. EXAM: PORTABLE CHEST 1 VIEW COMPARISON:  07/10/2016 FINDINGS: Endotracheal tube terminates approximately 4.5 cm above the carina. Enteric tube courses into the left upper abdomen with side hole beyond the GE junction and tip not  imaged. Cardiomediastinal silhouette is within normal limits. Aortic atherosclerosis is noted. Mildly increased interstitial densities on the prior study have mildly improved. There is improved aeration of the lung bases. No sizable pleural effusion or pneumothorax is identified. IMPRESSION: 1. Decreased interstitial prominence which may reflect improved edema. 2. Support devices as above. 3. Aortic atherosclerosis. Electronically Signed   By: Sebastian AcheAllen  Grady M.D.   On: 07/11/2016 06:50    STUDIES:  Echocardiogram 07/08/2016 showed grade 1 diastolic dysfunction with normal LV size and function  CULTURES: 07/10/2016 respiratory culture>>> pending   ANTIBIOTICS:  Levaquin 11/20>   SIGNIFICANT EVENTS:   LINES/TUBES: 07/10/2016> ETT   DISCUSSION:  65 year old female with a past medical history significant for severe COPD was just admitted  for a COPD exacerbation returned early in the morning on 07/10/2016 with acute respiratory failure with hypoxemia. Intubated and now requiring mechanical ventilatory support.  Treating as AECOPD. Much better. Now ready extubation. For today we will cont steroids (decrease dose), cont BDs, give lasix x 1, mobilize, cont abx as we await cultures to come back. Likely we can move her out of the ICU in the next 24 hrs and change her back her regular BDs   ASSESSMENT / PLAN:  PULMONARY A: Acute on chronic hypercarbic and hypoxic failure resolved Acute exacerbation of COPD P:   Extubate this AM Nasal Canula (to baseline) pulm hygiene w/ flutter and IS Taper steroids 40mg  bid  Cont duoneb q 4 for another day As needed albuterol Lasix X1  Wean O2   CARDIOVASCULAR A:  Grade I diastolic dysfxn  Hypertension P:  Monitor urine output Telemetry monitoring Lasix X 1 PRN hydralazine for now  RENAL A:   Hyponatremia  P:   Monitor BMET and UOP NSL Lasix X1 Repeat chem in am  Replace electrolytes as needed   GASTROINTESTINAL A:   No acute issues P:   Dc SUP Adv diet   HEMATOLOGIC A:   No acute issues P:  Monitor for bleeding  INFECTIOUS A:   Severe COPD with exacerbation, at risk for Pseudomonas colonization/infection P:   Respiratory culture IV Levaquin  ENDOCRINE A:   Hyperglycemia  P:   Monitor glucose ssi   NEUROLOGIC A:   Acute encephalopathy in the setting of severe respiratory failure with hypoxemia has resolved P:   RASS goal 0    FAMILY  - Updates: No family at bediside - Inter-disciplinary family meet or Palliative Care meeting due by:  11/27.  Critical care time- 30 minutes.   Simonne MartinetPeter E Starkisha Tullis ACNP-BC [facility] Pager # [phone] OR # [phone] if no answer  07/11/2016, 8:53 AM

## 2016-07-11 NOTE — Progress Notes (Addendum)
Nutrition Follow-up  DOCUMENTATION CODES:   Not applicable  INTERVENTION:  - Diet advancement as medically feasible.  - RD will monitor for needs with diet advancement.  NUTRITION DIAGNOSIS:   Inadequate oral intake related to inability to eat as evidenced by NPO status. -ongoing  GOAL:   Patient will meet greater than or equal to 90% of their needs -unable to meet.  MONITOR:   Diet advancement, Weight trends, Labs, I & O's  ASSESSMENT:   65 year old female with a past medical history significant for severe COPD, active smoker who was just [facility] for an exacerbation of COPD and left AMA on 07/09/2016. She returned 07/10/2016 acutely ill and respiratory failure. She was brought in by EMS and was noted to be hypoxemic. In the emergency department she was treated with BiPAP but unfortunately she continued to have significant desaturation of oxygen so she required intubation. No further history could be obtained due to patient intubation.  11/21 Pt extubated earlier this AM and is now on [place]. Estimated nutrition needs updated accordingly. Pt remains NPO at this time. Weight stable from yesterday. RD will follow-up tomorrow and provide interventions if warranted at that time.  Medications reviewed; 20 mg IV Pepcid BID, sliding scale Novolog, 40 mg IV Solu-medrol BID, PRN IV Zofran, 40 mEq KCl x1 dose yesterday.  Labs reviewed; CBGs: 118 and 119 mg/dL, Na: 161124 mmol/L, Cl: 84 mmol/L, Ca: 8.6 mg/dL.    11/20 - Pt left AMA from ED yesterday PM and returned overnight and was intubated shortly after that time.  - No family/visitors present at this time.  - OGT now in place. CCM NP note from earlier this AM reviewed. - Physical assessment shows no muscle or fat wasting, no edema at this time.  - Per chart review, weight has been stable since January 2017; will monitor weight trends during hospitalization.   Patient is currently intubated on ventilator  support MV: 10.2 L/min Temp (24hrs), Avg:96.7 F (35.9 C), Min:95.5 F (35.3 C), Max:98.1 F (36.7 C) Propofol: 16.8 ml/hr (444 kcal). BP: 121/56 and MAP: 75. Drip: Propofol @ 40 mcg/kg/min.   Diet Order:  Diet NPO time specified  Skin:  Reviewed, no issues  Last BM:  11/19  Height:   Ht Readings from Last 1 Encounters:  07/10/16 5\' 4"  (1.626 m)    Weight:   Wt Readings from Last 1 Encounters:  07/11/16 161 lb 2.5 oz (73.1 kg)    Ideal Body Weight:  54.54 kg  BMI:  Body mass index is 27.66 kg/m.  Estimated Nutritional Needs:   Kcal:  1460-1680 (20-23 kcal/kg)  Protein:  73-88 grams (1-1.2 grams/kg)  Fluid:  per MD/NP given hyponatremia and hypochloridemia  EDUCATION NEEDS:   No education needs identified at this time    Trenton GammonJessica Blessing Ozga, MS, RD, LDN, CNSC Inpatient Clinical Dietitian Pager # [phone] After hours/weekend pager # [phone]

## 2016-07-11 NOTE — Procedures (Signed)
Extubation Procedure Note  Patient Details:   Name: Samantha KeasKathy D Glendinning DOB: 05-Jan-1951 MRN: [id number]   Airway Documentation:     Evaluation  O2 sats: stable throughout Complications: No apparent complications Patient did tolerate procedure well. Bilateral Breath Sounds: Diminished  Pt placed on 3 lpm [place] Yes  Marvelle Span L 07/11/2016, 9:38 AM

## 2016-07-12 DIAGNOSIS — E875 Hyperkalemia: Secondary | ICD-10-CM

## 2016-07-12 DIAGNOSIS — J9622 Acute and chronic respiratory failure with hypercapnia: Secondary | ICD-10-CM

## 2016-07-12 DIAGNOSIS — J209 Acute bronchitis, unspecified: Secondary | ICD-10-CM

## 2016-07-12 LAB — GLUCOSE, CAPILLARY
GLUCOSE-CAPILLARY: 106 mg/dL — AB (ref 65–99)
GLUCOSE-CAPILLARY: 99 mg/dL (ref 65–99)
Glucose-Capillary: 127 mg/dL — ABNORMAL HIGH (ref 65–99)
Glucose-Capillary: 105 mg/dL — ABNORMAL HIGH (ref 65–99)

## 2016-07-12 LAB — BASIC METABOLIC PANEL
ANION GAP: 9 (ref 5–15)
BUN: 19 mg/dL (ref 6–20)
CALCIUM: 8.9 mg/dL (ref 8.9–10.3)
CO2: 34 mmol/L — AB (ref 22–32)
Chloride: 84 mmol/L — ABNORMAL LOW (ref 101–111)
Creatinine, Ser: 0.84 mg/dL (ref 0.44–1.00)
GFR calc non Af Amer: >60 mL/min (ref >60)
Glucose, Bld: 96 mg/dL (ref 65–99)
Potassium: 4.6 mmol/L (ref 3.5–5.1)
Sodium: 127 mmol/L — ABNORMAL LOW (ref 135–145)

## 2016-07-12 LAB — CBC WITH DIFFERENTIAL/PLATELET
Basophils Absolute: 0 10*3/uL (ref 0.0–0.1)
Basophils Relative: 0 %
EOS PCT: 0 %
Eosinophils Absolute: 0 10*3/uL (ref 0.0–0.7)
HCT: 41.6 % (ref 36.0–46.0)
Hemoglobin: 13.8 g/dL (ref 12.0–15.0)
LYMPHS ABS: 0.2 10*3/uL — AB (ref 0.7–4.0)
LYMPHS PCT: 3 %
MCH: 29.6 pg (ref 26.0–34.0)
MCHC: 33.2 g/dL (ref 30.0–36.0)
MCV: 89.3 fL (ref 78.0–100.0)
MONO ABS: 0.6 10*3/uL (ref 0.1–1.0)
MONOS PCT: 7 %
Neutro Abs: 8.2 10*3/uL — ABNORMAL HIGH (ref 1.7–7.7)
Neutrophils Relative %: 90 %
PLATELETS: 135 10*3/uL — AB (ref 150–400)
RBC: 4.66 MIL/uL (ref 3.87–5.11)
RDW: 13.6 % (ref 11.5–15.5)
WBC: 9 10*3/uL (ref 4.0–10.5)

## 2016-07-12 LAB — RENAL FUNCTION PANEL
ALBUMIN: 3.6 g/dL (ref 3.5–5.0)
ANION GAP: 8 (ref 5–15)
BUN: 22 mg/dL — AB (ref 6–20)
CALCIUM: 8.6 mg/dL — AB (ref 8.9–10.3)
CO2: 37 mmol/L — ABNORMAL HIGH (ref 22–32)
Chloride: 84 mmol/L — ABNORMAL LOW (ref 101–111)
Creatinine, Ser: 0.94 mg/dL (ref 0.44–1.00)
GFR calc Af Amer: >60 mL/min (ref >60)
Glucose, Bld: 121 mg/dL — ABNORMAL HIGH (ref 65–99)
PHOSPHORUS: 4.6 mg/dL (ref 2.5–4.6)
POTASSIUM: 5.3 mmol/L — AB (ref 3.5–5.1)
SODIUM: 129 mmol/L — AB (ref 135–145)

## 2016-07-12 LAB — MAGNESIUM: Magnesium: 2.2 mg/dL (ref 1.7–2.4)

## 2016-07-12 LAB — CULTURE, RESPIRATORY: SPECIAL REQUESTS: NORMAL

## 2016-07-12 LAB — CULTURE, RESPIRATORY W GRAM STAIN: Gram Stain: NONE SEEN

## 2016-07-12 MED ORDER — PREDNISONE 20 MG PO TABS
60.0000 mg | ORAL_TABLET | Freq: Every day | ORAL | Status: DC
  Administered 2016-07-13: 60 mg via ORAL
  Filled 2016-07-12: qty 3

## 2016-07-12 MED ORDER — LEVOFLOXACIN 500 MG PO TABS: 500.0000 mg | ORAL_TABLET | Freq: Every day | ORAL | 0 refills | Status: AC

## 2016-07-12 MED ORDER — PANTOPRAZOLE SODIUM 20 MG PO TBEC
20.0000 mg | DELAYED_RELEASE_TABLET | Freq: Every day | ORAL | Status: DC
  Administered 2016-07-12: 20 mg via ORAL
  Filled 2016-07-12 (×2): qty 1

## 2016-07-12 MED ORDER — PREDNISONE 10 MG PO TABS: ORAL_TABLET | ORAL | 0 refills | Status: DC

## 2016-07-12 MED ORDER — HYDRALAZINE HCL 20 MG/ML IJ SOLN: 10.0000 mg | INTRAMUSCULAR | Status: DC | PRN

## 2016-07-12 MED ORDER — IPRATROPIUM-ALBUTEROL 0.5-2.5 (3) MG/3ML IN SOLN
3.0000 mL | Freq: Four times a day (QID) | RESPIRATORY_TRACT | Status: DC
  Administered 2016-07-12 – 2016-07-13 (×3): 3 mL via RESPIRATORY_TRACT
  Filled 2016-07-12 (×4): qty 3

## 2016-07-12 NOTE — Progress Notes (Signed)
Nutrition Follow-up  DOCUMENTATION CODES:   Not applicable  INTERVENTION:  - Continue to encourage PO intakes of meals.  - RD will continue to monitor for nutrition-related needs.  NUTRITION DIAGNOSIS:   Increased nutrient needs related to chronic illness (COPD) as evidenced by estimated needs (for protein). -revised  GOAL:   Patient will meet greater than or equal to 90% of their needs -likely beginning to meet with diet now advanced.  MONITOR:   PO intake, Weight trends, Labs, I & O's  ASSESSMENT:   65 year old female with a past medical history significant for severe COPD, active smoker who was just hospitalized [facility] for an exacerbation of COPD and left AMA on 07/09/2016. She returned 07/10/2016 acutely ill and respiratory failure. She was brought in by EMS and was noted to be hypoxemic. In the emergency department she was treated with BiPAP but unfortunately she continued to have significant desaturation of oxygen so she required intubation. No further history could be obtained due to patient intubation.  11/22 Pt's diet advanced from NPO to Carb Modified yesterday at 1654 and she consumed 100% of dinner last night. Pt currently walking in the hallways with RN. Weight -0.9 kg from yesterday. RD will continue to monitor for needs.  Medications reviewed; 40 mg IV Lasix x1 dose yesterday, sliding scale Novolog, 40 mg IV Solu-medrol BID, PRN IV Zofran, 40 mEq oral KCl x1 dose yesterday.  Labs reviewed; CBG: 127 mg/dL this AM, Na: 161129 mmol/L (trending up), K: 5.3 mmol/L (trending up), Cl: 84 mmol/L (stable), BUN: 22 mg/dL, Ca: 8.6 mg/dL.   11/21 - Pt extubated earlier this AM and is now on [place].  - Estimated nutrition needs updated accordingly.  - Pt remains NPO at this time.  - Weight stable from yesterday.    11/20 - Pt left AMA from ED yesterday PM and returned overnight and was intubated shortly after that time.  - No family/visitors present at this  time.  - OGT now in place. CCM NP note from earlier this AM reviewed. - Physical assessment shows no muscle or fat wasting, no edema at this time.  - Per chart review, weight has been stable since January 2017; will monitor weight trends during hospitalization.   Patient is currently intubated on ventilator support MV: 10.2L/min Temp (24hrs), Avg:96.7 F (35.9 C), Min:95.5 F (35.3 C), Max:98.1 F (36.7 C) Propofol: 16.598ml/hr (444 kcal). BP: 121/56 and MAP: 75. Drip: Propofol @ 40 mcg/kg/min.   Diet Order:  Diet Carb Modified Fluid consistency: Thin; Room service appropriate? Yes  Skin:  Reviewed, no issues  Last BM:  11/19  Height:   Ht Readings from Last 1 Encounters:  07/10/16 5\' 4"  (1.626 m)    Weight:   Wt Readings from Last 1 Encounters:  07/12/16 159 lb 2.8 oz (72.2 kg)    Ideal Body Weight:  54.54 kg  BMI:  Body mass index is 27.32 kg/m.  Estimated Nutritional Needs:   Kcal:  1460-1680 (20-23 kcal/kg)  Protein:  73-88 grams (1-1.2 grams/kg)  Fluid:  per MD/NP given hyponatremia and hypochloridemia  EDUCATION NEEDS:   No education needs identified at this time    Trenton GammonJessica Devonta Blanford, MS, RD, LDN, CNSC Inpatient Clinical Dietitian Pager # [phone] After hours/weekend pager # [phone]

## 2016-07-12 NOTE — Progress Notes (Signed)
PULMONARY / CRITICAL CARE MEDICINE   Name: Samantha Dyer MRN: [id number]  DOB: [birth year]    ADMISSION DATE:  07/10/2016 CONSULTATION DATE:  07/10/2016  REFERRING MD:  Dr. Daun PeacockPalombo  CHIEF COMPLAINT:  Shortness of breath  SUBJECTIVE: Patient extubated yesterday. Reports dyspnea markedly improving. Patient did ambulate in the ICU today. Denies any chest pain, tightness, or pressure.  REVIEW OF SYSTEMS: Mild nausea but no abdominal pain. No emesis. No subjective fever or chills. No headache or vision changes.  VITAL SIGNS: BP (!) 168/70 (BP Location: Right Arm)   Pulse 115   Temp 98.1 F (36.7 C) (Oral)   Resp 18   Ht 5\' 4"  (1.626 m)   Wt 159 lb 2.8 oz (72.2 kg)   SpO2 94%   BMI 27.32 kg/m  2 liters     VENTILATOR SETTINGS:    INTAKE / OUTPUT: I/O last 3 completed shifts: In: 611.9 [I.V.:311.9; IV Piggyback:300] Out: 3075 [Urine:2975; Emesis/NG output:100]  PHYSICAL EXAMINATION: General:  Sitting up in bed. Husband at bedside. No distress. Neuro:  Alert and oriented 4. Grossly nonfocal. No meningismus. HEENT:  Moist mucous membranes. No scleral icterus. Cardiovascular:  Regular rate. No edema. No appreciable JVD. Pulmonary:  Symmetrically decreased breath sounds. Normal work of breathing. No auscultation of wheezing. Abdomen:  Soft. Protuberant. Nontender. Integument: Warm and dry. No rash on exposed skin.  LABS:  BMET  Recent Labs Lab 07/10/16 0538 07/11/16 0311 07/12/16 0312  NA 125* 124* 129*  K 3.3* 4.2 5.3*  CL 82* 84* 84*  CO2 30 32 37*  BUN 17 19 22*  CREATININE 0.96 0.91 0.94  GLUCOSE 219* 120* 121*    Electrolytes  Recent Labs Lab 07/08/16 0221 07/09/16 0506  07/10/16 0538 07/11/16 0311 07/12/16 0312  CALCIUM 8.8* 8.8*  < > 8.5* 8.6* 8.6*  MG 1.5* 2.2  --   --  2.1 2.2  PHOS 3.6  --   --   --  3.1 4.6  < > = values in this interval not displayed.  CBC  Recent Labs Lab 07/10/16 0538 07/11/16 0311 07/12/16 0312  WBC 16.9* 8.4  9.0  HGB 14.1 12.9 13.8  HCT 41.5 36.8 41.6  PLT 152 127* 135*    Coag's No results for input(s): APTT, INR in the last 168 hours.  Sepsis Markers  Recent Labs Lab 07/10/16 0538  PROCALCITON 0.25    ABG  Recent Labs Lab 07/10/16 0320 07/10/16 0618  PHART 7.242* 7.390  PCO2ART 78.9* 50.0*  PO2ART ABOVE REPORTABLE RANGE 137*    Liver Enzymes  Recent Labs Lab 07/08/16 0221 07/10/16 0215 07/11/16 0311 07/12/16 0312  AST 25 29 19   --   ALT 8* 14 14  --   ALKPHOS 81 84 57  --   BILITOT 1.2 0.9 1.1  --   ALBUMIN 3.6 4.5 3.3* 3.6    Cardiac Enzymes  Recent Labs Lab 07/07/16 2037 07/08/16 0221 07/08/16 0758  TROPONINI <0.03 <0.03 <0.03    Glucose  Recent Labs Lab 07/11/16 0753 07/11/16 1103 07/11/16 1532 07/11/16 2139 07/12/16 0750 07/12/16 1110  GLUCAP 118* 109* 111* 121* 127* 105*    Imaging No results found.  STUDIES:  TTE 11/18:  LV normal in size with EF 55-60%. Inadequate for wall motion assessment. Grade 1 diastolic dysfunction with elevated LV filling pressure. LA mildly dilated & RA normal in size. RV poorly visualized. No aortic regurgitation or stenosis was poorly visualized valve. Aortic root normal in size. Trivial mitral regurgitation.  No significant pulmonic regurgitation. No significant tricuspid regurgitation. Pulmonary artery poorly visualized. No pericardial effusion.  MICROBIOLOGY: MRSA PCR 11/17:  Negative Tracheal Asp Ctx 11/20 >> GPC  ANTIBIOTICS:  Levaquin 11/20 >>  SIGNIFICANT EVENTS: 11/17 - 11/19 - Admit w/ COPD Exacerbation & left AMA  11/20 - Re-admit with Respiratory Failure & COPD Exacerbation intubated  LINES/TUBES: OETT 11/20 - 11/21 PIV x2  ASSESSMENT / PLAN:  PULMONARY A: Acute on Chronic Hypercarbic & Hypoxic Respiratory Failure - Improving. COPD w/ Exacerbation Tobacco Use Disorder  P:   Weaning FiO2 for Sat 88-94% Continuous Pulse Oximetry Continuing Duoneb q6hr Changing Solu-medrol to  Prednisone 60mg  PO daily starting tomorrow Plan for a 2 week taper of Prednisone Plan to restart home Breo & Spiriva prior to discharge from hospital Nicotine 14mg  patch daily Spent over 3 minutes educating the patient on the need for complete tobacco cessation but she admits she is not ready to quit yet   CARDIOVASCULAR A:  Diastolic CHF - Seen on TTE 11/18. S/P Lasix IV x1 on 11/21. H/O HTN  P:  Continuous Telemetry Monitoring Vitals per unit protocol Hydralazine IV prn   RENAL A:   Hyponatremia - Mild. Improving. Metabolic Alkalosis - New. Mild. Hyperkalemia - Mild.  P:   Trending UOP Monitoring electrolytes & renal function daily Replacing electrolytes as indicated Repeat BMP @ 1500 today  GASTROINTESTINAL A:   H/O GERD  P:   Heart Healthy/Carb Modified Diet Protonix PO daily (on Nexium at home)  HEMATOLOGIC A:   Thrombocytopenia - Mild.  P:  Trending cell counts daily w/ CBC SCDs Lovenox [place] daily  INFECTIOUS A:   Acute Bronchitis - GPC on tracheal aspirate culture.  P:   Empiric Levaquin Day #3/7 Awaiting result of tracheal aspirate culture  ENDOCRINE A:   Hyperglycemia - Likely secondary to steroid. No h/o DM.  P:   Accu-Checks qAC & HS SSI  Per Sensitive Algorithm   NEUROLOGIC A:   Acute Encephalopathy - Resolved. Multifactorial with hypoxia & hypercarbia.  P:   Monitor closely   FAMILY  - Updates: Patient & husband updated 11/22 by Dr. Jamison NeighborNestor at bedside.  - Inter-disciplinary family meet or Palliative Care meeting due by:  11/27.  TODAY'S SUMMARY:  65 y.o. female with a past medical history significant for severe COPD was just admitted for a COPD exacerbation returned early in the morning on 07/10/2016 with acute respiratory failure with hypoxemia. Patient successfully yesterday with dramatic improvement in oxygen requirement. She is successfully ambulating. Continuing to monitor another day as I transition to oral prednisone therapy  and furthers sound her nebulizer regimen for treatment of her COPD exacerbation with acute hypercarbic respiratory failure. Repeating basic metabolic panel this afternoon given mild hyperkalemia this morning. Continuing telemetry monitoring. Patient may be able to discharge to home tomorrow depending upon continued clinical stability and progress. Transferring to medical telemetry bed for further monitoring.  Leaving on PCCM service anticipating discharge tomorrow.   Donna ChristenJennings E. Jamison NeighborNestor, M.D. [facility] Pager:  [phone] After 3pm or if no response, call [phone] 07/12/2016, 11:22 AM

## 2016-07-12 NOTE — Discharge Summary (Signed)
Physician Discharge Summary  Patient ID: Lum KeasKathy D Kirn MRN: [id number] DOB/AGE: 11-29-1950 65 y.o.  Admit date: 07/10/2016 Discharge date: 07/13/2016  Problem List Active Problems:   Acute respiratory failure with hypoxemia [facility](HCC)   Brief SUMMARY:  65 y.o. female with a past medical history significant for severe COPD was just admitted for a COPD exacerbation returned early in the morning on 07/10/2016 with acute respiratory failure with hypoxemia.   In the emergency department she was treated with BiPAP but unfortunately she continued to have significant desaturation of oxygen so she required intubation.  She was treated with IV steroids, IV abx, diuresis and ventilatory support.  She was extubated quickly after 24 hours and improved slowly.  Steroids and abx have been transitioned to PO.  She is successfully ambulating. She appears to have reached maximum hospital benefit and will be discharged home with family to f/u [name].    Hospital Course: STUDIES:  TTE 11/18:  LV normal in size with EF 55-60%. Inadequate for wall motion assessment. Grade 1 diastolic dysfunction with elevated LV filling pressure. LA mildly dilated & RA normal in size. RV poorly visualized. No aortic regurgitation or stenosis was poorly visualized valve. Aortic root normal in size. Trivial mitral regurgitation. No significant pulmonic regurgitation. No significant tricuspid regurgitation. Pulmonary artery poorly visualized. No pericardial effusion.  MICROBIOLOGY: MRSA PCR 11/17:  Negative Tracheal Asp Ctx 11/20 >> GPC  ANTIBIOTICS:  Levaquin 11/20 >> total 7 days   SIGNIFICANT EVENTS: 11/17 - 11/19 - Admit w/ COPD Exacerbation & left AMA  11/20 - Re-admit with Respiratory Failure & COPD Exacerbation intubated  LINES/TUBES: OETT 11/20 - 11/21 PIV x2  ASSESSMENT / PLAN:   Acute on Chronic Hypercarbic & Hypoxic Respiratory Failure - Improving. COPD w/  Exacerbation Tobacco Use Disorder Acute Bronchitis - GPC on tracheal aspirate culture. P:   Weaning FiO2 for Sat 88-94% Continuing Duoneb q6hr Changing Solu-medrol to Prednisone 60mg  PO daily starting 11/23 for taper over 2 weeks Plan for a 2 week taper of Prednisone Plan to restart home Breo & Spiriva prior to discharge from hospital Nicotine 14mg  patch daily- No smoking! Empiric Levaquin Day #4/7- top complete as out patient Awaiting result of tracheal aspirate culture Spent over 30 minutes educating the patient on the need for complete tobacco cessation but she admits she is not ready to quit yet    Diastolic CHF - Seen on TTE 11/18. S/P Lasix IV x1 on 11/21. H/O HTN  P:  PCP f/u     FAMILY  - Updates: Patient & husband updated 11/22 by Dr. Jamison NeighborNestor at bedside.  - Inter-disciplinary family meet or Palliative Care meeting due by:  11/27.     Labs at discharge Lab Results  Component Value Date   CREATININE 0.84 07/13/2016   BUN 18 07/13/2016   NA 130 (L) 07/13/2016   K 3.7 07/13/2016   CL 86 (L) 07/13/2016   CO2 35 (H) 07/13/2016   Lab Results  Component Value Date   WBC 10.6 (H) 07/13/2016   HGB 13.5 07/13/2016   HCT 41.2 07/13/2016   MCV 88.6 07/13/2016   PLT 139 (L) 07/13/2016   Lab Results  Component Value Date   ALT 14 07/11/2016   AST 19 07/11/2016   ALKPHOS 57 07/11/2016   BILITOT 1.1 07/11/2016   No results found for: INR, PROTIME  Current radiology studies No results found.  Disposition:  07-Left Against Medical Advice/Left Without Being Seen/Elopement  Discharge  Instructions    Discharge patient    Complete by:  As directed        Medication List    STOP taking these medications   doxycycline 100 MG tablet Commonly known as:  VIBRA-TABS     TAKE these medications   BREO ELLIPTA 200-25 MCG/INH Aepb Generic drug:  fluticasone furoate-vilanterol Inhale 1 puff into the lungs daily.   esomeprazole 20 MG capsule Commonly  known as:  NEXIUM Take 20 mg by mouth daily.   guaiFENesin 600 MG 12 hr tablet Commonly known as:  MUCINEX Take 1 tablet (600 mg total) by mouth 2 (two) times daily as needed for to loosen phlegm.   ibuprofen 200 MG tablet Commonly known as:  ADVIL,MOTRIN Take 200 mg by mouth every 6 (six) hours as needed for moderate pain.   ipratropium-albuterol 0.5-2.5 (3) MG/3ML Soln Commonly known as:  DUONEB Inhale 3 mLs into the lungs every 4 (four) hours as needed (Shortness of breath).   COMBIVENT RESPIMAT 20-100 MCG/ACT Aers respimat Generic drug:  Ipratropium-Albuterol Inhale 1 puff into the lungs every 6 (six) hours as needed for wheezing or shortness of breath.   levofloxacin 500 MG tablet Commonly known as:  LEVAQUIN Take 1 tablet (500 mg total) by mouth daily.   nicotine 21 mg/24hr patch Commonly known as:  NICODERM CQ - dosed in mg/24 hours Place 1 patch (21 mg total) onto the skin daily.   nystatin 100000 UNIT/ML suspension Commonly known as:  MYCOSTATIN Take 5 mLs by mouth 4 (four) times daily.   OXYGEN Inhale into the lungs continuous. 2 L   predniSONE 10 MG tablet Commonly known as:  DELTASONE Take 4 tabs  daily with food x 4 days, then 3 tabs daily x 4 days, then 2 tabs daily x 4 days, then 1 tab daily x4 days then stop. #40 What changed:  how much to take  how to take this  when to take this  additional instructions   sodium chloride 1 g tablet Take 2 tablets (2 g total) by mouth 2 (two) times daily.   SPIRIVA HANDIHALER 18 MCG inhalation capsule Generic drug:  tiotropium daily.      Follow-up Information    Rubye Oaksammy Parrett, NP. Call.   Specialty:  Pulmonary Disease Why:  Call [phone] om monday and make an appoinment to see Tammy Parret ANP to see you in 2 weeks. Contact information: [address] [place] [address] [phone]            Discharged Condition: fair  Time spent on discharge greater than 40 minutes.  Vital signs  at Discharge. Temp:  [97.9 F (36.6 C)-98.9 F (37.2 C)] 97.9 F (36.6 C) (11/23 0522) Pulse Rate:  [81-110] 96 (11/23 0853) Resp:  [16-24] 18 (11/23 0853) BP: (103-114)/(64-81) 103/64 (11/23 0522) SpO2:  [88 %-100 %] 90 % (11/23 0853) Weight:  [70.1 kg (154 lb 9.6 oz)] 70.1 kg (154 lb 9.6 oz) (11/23 0522)    Office follow up Special Information or instructions. Needs to see Tammy Parret ANP and then follow up with Dr. Vassie [name].   Dirk DressKaty Jamair Cato, NP 07/13/2016  11:09 AM Pager: [phone] or [phone]

## 2016-07-13 LAB — RENAL FUNCTION PANEL
ALBUMIN: 3.7 g/dL (ref 3.5–5.0)
ANION GAP: 9 (ref 5–15)
BUN: 18 mg/dL (ref 6–20)
CALCIUM: 8.5 mg/dL — AB (ref 8.9–10.3)
CO2: 35 mmol/L — ABNORMAL HIGH (ref 22–32)
Chloride: 86 mmol/L — ABNORMAL LOW (ref 101–111)
Creatinine, Ser: 0.84 mg/dL (ref 0.44–1.00)
GFR calc non Af Amer: >60 mL/min (ref >60)
Glucose, Bld: 76 mg/dL (ref 65–99)
PHOSPHORUS: 2.9 mg/dL (ref 2.5–4.6)
Potassium: 3.7 mmol/L (ref 3.5–5.1)
SODIUM: 130 mmol/L — AB (ref 135–145)

## 2016-07-13 LAB — CBC WITH DIFFERENTIAL/PLATELET
BASOS ABS: 0 10*3/uL (ref 0.0–0.1)
BASOS PCT: 0 %
Eosinophils Absolute: 0 10*3/uL (ref 0.0–0.7)
Eosinophils Relative: 0 %
HEMATOCRIT: 41.2 % (ref 36.0–46.0)
HEMOGLOBIN: 13.5 g/dL (ref 12.0–15.0)
Lymphocytes Relative: 12 %
Lymphs Abs: 1.2 10*3/uL (ref 0.7–4.0)
MCH: 29 pg (ref 26.0–34.0)
MCHC: 32.8 g/dL (ref 30.0–36.0)
MCV: 88.6 fL (ref 78.0–100.0)
Monocytes Absolute: 1.2 10*3/uL — ABNORMAL HIGH (ref 0.1–1.0)
Monocytes Relative: 11 %
NEUTROS ABS: 8.1 10*3/uL — AB (ref 1.7–7.7)
NEUTROS PCT: 77 %
Platelets: 139 10*3/uL — ABNORMAL LOW (ref 150–400)
RBC: 4.65 MIL/uL (ref 3.87–5.11)
RDW: 13.5 % (ref 11.5–15.5)
WBC: 10.6 10*3/uL — AB (ref 4.0–10.5)

## 2016-07-13 LAB — MAGNESIUM: Magnesium: 1.9 mg/dL (ref 1.7–2.4)

## 2016-07-13 LAB — GLUCOSE, CAPILLARY: GLUCOSE-CAPILLARY: 91 mg/dL (ref 65–99)

## 2016-07-13 NOTE — Progress Notes (Signed)
PULMONARY / CRITICAL CARE MEDICINE   Name: Samantha Dyer MRN: [id number]  DOB: June 19, 1951    ADMISSION DATE:  07/10/2016 CONSULTATION DATE:  07/10/2016  REFERRING MD:  Dr. Daun PeacockPalombo  CHIEF COMPLAINT:  Shortness of breath  SUBJECTIVE: She was standing in hall way, waiting for discharge home. Husband here.  REVIEW OF SYSTEMS: Mild nausea but no abdominal pain. No emesis. No subjective fever or chills. No headache or vision changes.  VITAL SIGNS: BP 103/64 (BP Location: Left Arm)   Pulse 96   Temp 97.9 F (36.6 C) (Oral)   Resp 18   Ht 5\' 4"  (1.626 m)   Wt 70.1 kg (154 lb 9.6 oz)   SpO2 90%   BMI 26.54 kg/m  2 liters     VENTILATOR SETTINGS:    INTAKE / OUTPUT: I/O last 3 completed shifts: In: 100 [IV Piggyback:100] Out: 600 [Urine:600]  PHYSICAL EXAMINATION: General:  Sitting up in bed. Husband at bedside. No distress.She is eager to get home and will decide on her own about returning to work next week. Neuro:  Alert and oriented 4. Grossly nonfocal. No meningismus. HEENT:  Moist mucous membranes. No scleral icterus. Cardiovascular:  Regular rate. No edema. No appreciable JVD. Pulmonary:  Symmetrically decreased breath sounds. Normal work of breathing. No auscultation of wheezing. Abdomen:  Soft. Protuberant. Nontender. Integument: Warm and dry. No rash on exposed skin.  LABS:  BMET  Recent Labs Lab 07/12/16 0312 07/12/16 1644 07/13/16 0330  NA 129* 127* 130*  K 5.3* 4.6 3.7  CL 84* 84* 86*  CO2 37* 34* 35*  BUN 22* 19 18  CREATININE 0.94 0.84 0.84  GLUCOSE 121* 96 76    Electrolytes  Recent Labs Lab 07/11/16 0311 07/12/16 0312 07/12/16 1644 07/13/16 0330  CALCIUM 8.6* 8.6* 8.9 8.5*  MG 2.1 2.2  --  1.9  PHOS 3.1 4.6  --  2.9    CBC  Recent Labs Lab 07/11/16 0311 07/12/16 0312 07/13/16 0330  WBC 8.4 9.0 10.6*  HGB 12.9 13.8 13.5  HCT 36.8 41.6 41.2  PLT 127* 135* 139*    Coag's No results for input(s): APTT, INR in the last  168 hours.  Sepsis Markers  Recent Labs Lab 07/10/16 0538  PROCALCITON 0.25    ABG  Recent Labs Lab 07/10/16 0320 07/10/16 0618  PHART 7.242* 7.390  PCO2ART 78.9* 50.0*  PO2ART ABOVE REPORTABLE RANGE 137*    Liver Enzymes  Recent Labs Lab 07/08/16 0221 07/10/16 0215 07/11/16 0311 07/12/16 0312 07/13/16 0330  AST 25 29 19   --   --   ALT 8* 14 14  --   --   ALKPHOS 81 84 57  --   --   BILITOT 1.2 0.9 1.1  --   --   ALBUMIN 3.6 4.5 3.3* 3.6 3.7    Cardiac Enzymes  Recent Labs Lab 07/07/16 2037 07/08/16 0221 07/08/16 0758  TROPONINI <0.03 <0.03 <0.03    Glucose  Recent Labs Lab 07/11/16 2139 07/12/16 0750 07/12/16 1110 07/12/16 1647 07/12/16 2109 07/13/16 0753  GLUCAP 121* 127* 105* 106* 99 91    Imaging No results found.  STUDIES:  TTE 11/18:  LV normal in size with EF 55-60%. Inadequate for wall motion assessment. Grade 1 diastolic dysfunction with elevated LV filling pressure. LA mildly dilated & RA normal in size. RV poorly visualized. No aortic regurgitation or stenosis was poorly visualized valve. Aortic root normal in size. Trivial mitral regurgitation. No significant pulmonic regurgitation. No significant  tricuspid regurgitation. Pulmonary artery poorly visualized. No pericardial effusion.  MICROBIOLOGY: MRSA PCR 11/17:  Negative Tracheal Asp Ctx 11/20 >> GPC  ANTIBIOTICS:  Levaquin 11/20 >>  SIGNIFICANT EVENTS: 11/17 - 11/19 - Admit w/ COPD Exacerbation & left AMA  11/20 - Re-admit with Respiratory Failure & COPD Exacerbation intubated  LINES/TUBES: OETT 11/20 - 11/21 PIV x2  ASSESSMENT / PLAN:  PULMONARY A: Acute on Chronic Hypercarbic & Hypoxic Respiratory Failure - Improving. COPD w/ Exacerbation Tobacco Use Disorder  P:   Weaning FiO2 for Sat 88-94% Continuing Duoneb q6hr Changing Solu-medrol to Prednisone 60mg  PO daily starting 11/23 for taper over 2 weeks Plan for a 2 week taper of Prednisone Plan to restart home  Breo & Spiriva prior to discharge from hospital Nicotine 14mg  patch daily- No smoking!    CARDIOVASCULAR A:  Diastolic CHF - Seen on TTE 11/18. S/P Lasix IV x1 on 11/21. H/O HTN  P:  Continuous Telemetry Monitoring Vitals per unit protocol Hydralazine IV prn   RENAL A:   Hyponatremia - Mild. Improving. Metabolic Alkalosis - New. Mild. Hyperkalemia - Mild.  P:      GASTROINTESTINAL A:   H/O GERD  P:   Heart Healthy/Carb Modified Diet resume Nexium at home  HEMATOLOGIC A:   Thrombocytopenia - Mild.  P:  Trending cell counts daily w/ CBC SCDs Lovenox [place] daily  INFECTIOUS A:   Acute Bronchitis - GPC on tracheal aspirate culture.  P:   Empiric Levaquin Day #4/7- top complete as out patient Awaiting result of tracheal aspirate culture  ENDOCRINE A:   Hyperglycemia - Likely secondary to steroid. No h/o DM.  P:   Accu-Checks qAC & HS SSI  Per Sensitive Algorithm   NEUROLOGIC A:   Acute Encephalopathy - Resolved. Multifactorial with hypoxia & hypercarbia.  P:   Monitor closely   FAMILY  - Updates: Patient & husband updated   - Inter-disciplinary family meet or Palliative Care meeting due by:  11/27.  TODAY'S SUMMARY:  65 y.o. female with a past medical history significant for severe COPD was just admitted for a COPD exacerbation returned early in the morning on 07/10/2016 with acute respiratory failure with hypoxemia.  She is successfully ambulating. Continuing to monitor another day as I transition to oral prednisone therapy and  her nebulizer regimen for treatment of her COPD exacerbation with acute hypercarbic respiratory failure. She appears to have reached maximum hospital benefit and will be discharged home with family to f/u with [name].  I will contact our NP to complete DC  CD Orlan LeavensYoung,MD, M.D. [facility] Pager:  [phone] After 3pm or if no response, call [phone] 07/13/2016, 10:50 AM

## 2016-07-26 NOTE — Discharge Summary (Signed)
Physician Discharge Summary  Lum KeasKathy Dyer Emig ZOX:[id number] DOB: [birth year] DOA: 07/07/2016  PCP: Lenora BoysFRIED, ROBERT L, MD  Admit date: 07/07/2016 Discharge date: 07/26/2016  [facility] Below is my last progress note.    Samantha Dyer Markson         WJX:[id number] DOB: [birth year] DOA: 07/07/2016  PCP: Lenora BoysFRIED, ROBERT L, MD             Brief Narrative:  Samantha Dyer Peoplesis a 65 y.o.femalewith medical history significant ofCOPD, chronic respiratory failure, tobacco abuse, noncompliance, pneumoniapresented with worsening shortness of breath dyspnea increased cough over the past 1 week has been progressively getting worse. She is using 3 L of oxygen at home but usually at night only She takes Combivent 4 times a day for past 3 days she hasn't been able to sleep and has to use pillows to prop herself up she has not been using her rescue nebulizer or albuterol treatments because she is said nobody have told her to do that. Denies any fever or chills no nausea no vomiting no constipation no chest pain patient presented to urgent care and was found to be satting 75% room air she was sent to emergency department from there. Patient reports that she is never able to lay down flat that's been going on for years. She has had chronic significant shortness of breath. deniesany chest pain. Patient states at this point she is interested in quitting smoking. Reports chronic left lower extremity swelling that has been slightly worse lately  Subjective: Feels well enough to go home today. Mild cough, clear sputum. No sore throat or sinus issues. No chest pain.   Assessment & Plan:   Principal Problem:   COPD exacerbation - acute resp failure with hypoxia with chronic resp failure -no longer requiring O2- cont  Nebs, Solumedrol, PRN O2, Doxycycline- no longer requiring BiPAP  Active Problems:   TOBACCO ABUSE - Nicotine patch- counseled to stop smoking   Hyponatremia ? Fluid overload - given a dose of Lasix on admission-  ECHO report below- only grade 1 dCHF    Hypomagnesemia - given IV Mg+ 4 gm and will start tabs BID  DVT prophylaxis: Lovenox Code Status: full code Family Communication:  Disposition Plan: med/surg- home hopefully tomorrow Consultants:   PCCM Procedures:  ECHO LVEF 55-60%, moderate LVH, normal wall motion, diastolic dysfunction (grade 1), elevated LV filling pressure, moderately calcified mitral annulus with trace to mild regurgitation, mild LAE, IVC suggests RA pressure of 8 mmHg, which is elevated. Antimicrobials:            Anti-infectives    Start     Dose/Rate Route Frequency Ordered Stop   07/07/16 2200  doxycycline (VIBRA-TABS) tablet 100 mg     100 mg Oral Every 12 hours 07/07/16 2034         Objective:       Vitals:   07/08/16 0700 07/08/16 0800 07/08/16 0809 07/08/16 0900  BP: 105/71 119/71  104/74  Pulse: 99 95 90 (!) 110  Resp: (!) 21 (!) 23 (!) 22 (!) 21  Temp:  97.9 F (36.6 C)    TempSrc:  Oral    SpO2: 96% 97% 96% 94%  Weight:        Intake/Output Summary (Last 24 hours) at 07/08/16 1215 Last data filed at 07/08/16 0100  Gross per 24 hour  Intake              243 ml  Output              250 ml  Net               -7 ml       Filed Weights   07/07/16 1641 07/08/16 0500  Weight: 72.6 kg (160 lb) 70.1 kg (154 lb 8.7 oz)    Examination: General exam: Appears comfortable  HEENT: PERRLA, oral mucosa moist, no sclera icterus or thrush Respiratory system: Clear to auscultation. Respiratory effort normal. Poor air entry Cardiovascular system: S1 & S2 heard, RRR.  No murmurs  Gastrointestinal system: Abdomen soft, non-tender, nondistended. Normal bowel sound. No organomegaly Central nervous system: Alert and oriented. No focal neurological deficits. Extremities: No cyanosis, clubbing or edema Skin: No rashes or ulcers Psychiatry:  Mood & affect  appropriate.     Data Reviewed: I have personally reviewed following labs and imaging studies  CBC:  Last Labs    Recent Labs Lab 07/07/16 1700 07/08/16 0221  WBC 7.6 5.8  NEUTROABS 5.8  --   HGB 13.6 13.8  HCT 41.5 42.2  MCV 90.8 91.1  PLT 169 164     Basic Metabolic Panel:  Last Labs    Recent Labs Lab 07/07/16 1700 07/07/16 2037 07/08/16 0221  NA 131*  --  131*  K 4.1  --  3.7  CL 87*  --  85*  CO2 35*  --  34*  GLUCOSE 110*  --  178*  BUN 5*  --  7  CREATININE 0.67  --  0.78  CALCIUM 8.7*  --  8.8*  MG  --  1.6* 1.5*  PHOS  --   --  3.6     GFR: CrCl cannot be calculated (Unknown ideal weight.). Liver Function Tests:  Last Labs    Recent Labs Lab 07/07/16 1700 07/08/16 0221  AST 17 25  ALT 10* 8*  ALKPHOS 84 81  BILITOT 1.1 1.2  PROT 6.3* 6.5  ALBUMIN 4.1 3.6     Last Labs   No results for input(s): LIPASE, AMYLASE in the last 168 hours.   Last Labs   No results for input(s): AMMONIA in the last 168 hours.   Coagulation Profile: Last Labs   No results for input(s): INR, PROTIME in the last 168 hours.   Cardiac Enzymes:  Last Labs    Recent Labs Lab 07/07/16 2037 07/08/16 0221 07/08/16 0758  TROPONINI <0.03 <0.03 <0.03     BNP (last 3 results) Recent Labs (within last 365 days)  No results for input(s): PROBNP in the last 8760 hours.   HbA1C: Recent Labs (last 2 labs)   No results for input(s): HGBA1C in the last 72 hours.   CBG: Last Labs   No results for input(s): GLUCAP in the last 168 hours.   Lipid Profile: Recent Labs (last 2 labs)   No results for input(s): CHOL, HDL, LDLCALC, TRIG, CHOLHDL, LDLDIRECT in the last 72 hours.   Thyroid Function Tests:  Recent Labs (last 2 labs)    Recent Labs  07/08/16 0221  TSH 0.654     Anemia Panel: Recent Labs (last 2 labs)   No results for input(s): VITAMINB12, FOLATE, FERRITIN, TIBC, IRON, RETICCTPCT in the last 72 hours.   Urine  analysis: Labs (Brief)          Component Value Date/Time   COLORURINE YELLOW 07/08/2016 0100   APPEARANCEUR CLEAR 07/08/2016 0100   LABSPEC 1.007 07/08/2016 0100   PHURINE 5.5 07/08/2016  0100   GLUCOSEU NEGATIVE 07/08/2016 0100   HGBUR NEGATIVE 07/08/2016 0100   BILIRUBINUR NEGATIVE 07/08/2016 0100   KETONESUR NEGATIVE 07/08/2016 0100   PROTEINUR NEGATIVE 07/08/2016 0100   NITRITE NEGATIVE 07/08/2016 0100   LEUKOCYTESUR NEGATIVE 07/08/2016 0100     Sepsis Labs: @LABRCNTIP (procalcitonin:4,lacticidven:4) )        Recent Results (from the past 240 hour(s))  MRSA PCR Screening     Status: None   Collection Time: 07/07/16  8:15 PM  Result Value Ref Range Status   MRSA by PCR NEGATIVE NEGATIVE Final    Comment:        The GeneXpert MRSA Assay (FDA approved for NASAL specimens only), is one component of a comprehensive MRSA colonization surveillance program. It is not intended to diagnose MRSA infection nor to guide or monitor treatment for MRSA infections.         Radiology Studies:  Imaging Results (Last 48 hours)  Dg Chest 2 View  Result Date: 07/07/2016 CLINICAL DATA:  65 y/o  F; 1 week of shortness of breath. EXAM: CHEST  2 VIEW COMPARISON:  07/07/2016 chest radiograph FINDINGS: Borderline cardiomegaly is stable. Aortic atherosclerosis with arch calcification. Small right and trace left pleural effusions. Interstitial pulmonary edema. No significant interval change from prior radiograph. Bones are unremarkable. IMPRESSION: Small bilateral effusions and interstitial pulmonary edema. Borderline cardiomegaly. No significant interval change. Electronically Signed   By: Mitzi HansenLance  Furusawa-Stratton M.Dyer.   On: 07/07/2016 17:47       Scheduled Meds: . doxycycline  100 mg Oral Q12H  . enoxaparin (LOVENOX) injection  40 mg Subcutaneous Q24H  . guaiFENesin  600 mg Oral BID  . ipratropium-albuterol  3 mL Inhalation Q6H  . magnesium sulfate 1 - 4 g  bolus IVPB  4 g Intravenous Once  . methylPREDNISolone (SOLU-MEDROL) injection  60 mg Intravenous Q12H  . nicotine  21 mg Transdermal Daily  . pantoprazole  40 mg Oral Daily  . sodium chloride flush  3 mL Intravenous Q12H   Continuous Infusions: . albuterol Stopped (07/07/16 2009)     LOS: 1 day    Time spent in minutes: 35    Tyreonna Czaplicki, MD [facility] Pager: [email or web address] Password [email or web address] 07/08/2016, 12:15 PM     Revision History

## 2016-07-27 ENCOUNTER — Encounter: Payer: Self-pay | Admitting: Adult Health

## 2016-07-27 ENCOUNTER — Ambulatory Visit: Payer: 59 | Admitting: Adult Health

## 2016-07-27 ENCOUNTER — Ambulatory Visit (INDEPENDENT_AMBULATORY_CARE_PROVIDER_SITE_OTHER): Payer: 59 | Admitting: Adult Health

## 2016-07-27 DIAGNOSIS — J9611 Chronic respiratory failure with hypoxia: Secondary | ICD-10-CM | POA: Diagnosis not present

## 2016-07-27 MED ORDER — FLUTICASONE FUROATE-VILANTEROL 200-25 MCG/INH IN AEPB: 1.0000 | INHALATION_SPRAY | Freq: Every day | RESPIRATORY_TRACT | 0 refills | Status: DC

## 2016-07-27 NOTE — Progress Notes (Signed)
   Subjective:    Patient ID: Samantha Dyer, female    DOB: [birth year], 65 y.o.   MRN: [id number]  HPI 65/F smoker for FU of gold stg 4 COPD, CO2 retainer .  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 Discharged on oxygen  07/27/2016 [facility] Pt returns for a post hospital follow up . She was admitted x 2 in NOV. For COPD . Last admission 11/20 with acute resp distress with hypoxemia requiring intubation.  She was treated with IV abx , steroids and neb. Discharged on steroid taper and Levaquin . She is feeling better but still very weak. Has restarted smoking . We discussed smoking cessation . She denies chest pain, orthopnea, edema or fever.   Significant tests/ events 2011Spirometry severe airway obstruction with FEV1 of 30% c/w Gold stg IV copd. TTE 11/18: LV normal in size with EF 55-60%.Inadequate for wall motion assessment. Grade 1 diastolic dysfunction with elevated LV filling pressure. LA mildly dilated &RA normal in size. RV poorly visualized. No aortic regurgitation or stenosis was poorly visualized valve. Aortic root normal in size. Trivial mitral regurgitation. No significant pulmonic regurgitation. No significant tricuspid regurgitation. Pulmonary artery poorly visualized. No pericardial effusion.  Review of Systems Constitutional:   No  weight loss, night sweats,  Fevers, chills, fatigue, or  lassitude.  HEENT:   No headaches,  Difficulty swallowing,  Tooth/dental problems, or  Sore throat,                No sneezing, itching, ear ache, nasal congestion, post nasal drip,   CV:  No chest pain,  Orthopnea, PND, swelling in lower extremities, anasarca, dizziness, palpitations, syncope.   GI  No heartburn, indigestion, abdominal pain, nausea, vomiting, diarrhea, change in bowel habits, loss of appetite, bloody stools.   Resp: No shortness of breath with exertion or at rest.  No excess  mucus, no productive cough,  No non-productive cough,  No coughing up of blood.  No change in color of mucus.  No wheezing.  No chest wall deformity  Skin: no rash or lesions.  GU: no dysuria, change in color of urine, no urgency or frequency.  No flank pain, no hematuria   MS:  No joint pain or swelling.  No decreased range of motion.  No back pain.  Psych:  No change in mood or affect. No depression or anxiety.  No memory loss.         Objective:   Physical Exam   Vitals:   07/27/16 1245  BP: 124/80  Pulse: 81  SpO2: 98%     Gen. Pleasant, well-nourished, in no distress ENT - no post nasal drip Neck: No JVD, no thyromegaly, no carotid bruits Lungs: no use of accessory muscles, no dullness to percussion, Decreased without rales or rhonchi  Cardiovascular: Rhythm regular, heart sounds  normal, no murmurs or gallops, no peripheral edema Musculoskeletal: No deformities, no cyanosis or clubbing    Samantha Parrett NP-C  [facility] . 07/27/2016

## 2016-07-27 NOTE — Patient Instructions (Signed)
Taper off prednisone as directed Continue on BREO and Spiriva  Continue on oxygen 2l/m  MUST STOP SMOKING   Follow up Dr. Vassie [name]  6-8 weeks and As needed   Please contact office for sooner follow up if symptoms do not improve or worsen or seek emergency care

## 2016-07-28 NOTE — Assessment & Plan Note (Signed)
Cont on O2 .  

## 2016-07-28 NOTE — Assessment & Plan Note (Signed)
Recent severe exacerbation with ongoing smoking requiring mechanical vent support.  She is improving We had a prolonged discussion about these complex clinical issues and went over the various important aspects to consider with smoking cessation and healthy lifestyle choices.   Plan  Patient Instructions  Taper off prednisone as directed Continue on BREO and Spiriva  Continue on oxygen 2l/m  MUST STOP SMOKING   Follow up Dr. Vassie [name]  6-8 weeks and As needed   Please contact office for sooner follow up if symptoms do not improve or worsen or seek emergency care

## 2016-08-26 NOTE — Progress Notes (Signed)
Reviewed & agree with plan  

## 2016-09-05 DIAGNOSIS — R21 Rash and other nonspecific skin eruption: Secondary | ICD-10-CM | POA: Diagnosis not present

## 2016-09-05 DIAGNOSIS — R0981 Nasal congestion: Secondary | ICD-10-CM | POA: Diagnosis not present

## 2016-09-05 DIAGNOSIS — J440 Chronic obstructive pulmonary disease with acute lower respiratory infection: Secondary | ICD-10-CM | POA: Diagnosis not present

## 2016-09-05 DIAGNOSIS — J209 Acute bronchitis, unspecified: Secondary | ICD-10-CM | POA: Diagnosis not present

## 2016-09-07 ENCOUNTER — Ambulatory Visit: Payer: 59 | Admitting: Adult Health

## 2016-09-12 ENCOUNTER — Inpatient Hospital Stay (HOSPITAL_COMMUNITY)
Admission: EM | Admit: 2016-09-12 | Discharge: 2016-09-15 | DRG: 208 | Disposition: A | Discharge status: Home / Self Care | Payer: Medicare Other | Attending: Internal Medicine | Admitting: Internal Medicine

## 2016-09-12 ENCOUNTER — Emergency Department (HOSPITAL_COMMUNITY): Payer: Medicare Other

## 2016-09-12 ENCOUNTER — Encounter (HOSPITAL_COMMUNITY): Payer: Self-pay | Admitting: Emergency Medicine

## 2016-09-12 DIAGNOSIS — I10 Essential (primary) hypertension: Secondary | ICD-10-CM | POA: Diagnosis present

## 2016-09-12 DIAGNOSIS — R739 Hyperglycemia, unspecified: Secondary | ICD-10-CM | POA: Diagnosis present

## 2016-09-12 DIAGNOSIS — Z8249 Family history of ischemic heart disease and other diseases of the circulatory system: Secondary | ICD-10-CM | POA: Diagnosis not present

## 2016-09-12 DIAGNOSIS — J9601 Acute respiratory failure with hypoxia: Secondary | ICD-10-CM

## 2016-09-12 DIAGNOSIS — Z800 Family history of malignant neoplasm of digestive organs: Secondary | ICD-10-CM | POA: Diagnosis not present

## 2016-09-12 DIAGNOSIS — Z803 Family history of malignant neoplasm of breast: Secondary | ICD-10-CM | POA: Diagnosis not present

## 2016-09-12 DIAGNOSIS — T380X5A Adverse effect of glucocorticoids and synthetic analogues, initial encounter: Secondary | ICD-10-CM | POA: Diagnosis present

## 2016-09-12 DIAGNOSIS — F17200 Nicotine dependence, unspecified, uncomplicated: Secondary | ICD-10-CM | POA: Diagnosis not present

## 2016-09-12 DIAGNOSIS — J9690 Respiratory failure, unspecified, unspecified whether with hypoxia or hypercapnia: Secondary | ICD-10-CM

## 2016-09-12 DIAGNOSIS — E871 Hypo-osmolality and hyponatremia: Secondary | ICD-10-CM | POA: Diagnosis present

## 2016-09-12 DIAGNOSIS — F17210 Nicotine dependence, cigarettes, uncomplicated: Secondary | ICD-10-CM | POA: Diagnosis present

## 2016-09-12 DIAGNOSIS — J441 Chronic obstructive pulmonary disease with (acute) exacerbation: Principal | ICD-10-CM | POA: Diagnosis present

## 2016-09-12 DIAGNOSIS — K219 Gastro-esophageal reflux disease without esophagitis: Secondary | ICD-10-CM | POA: Diagnosis not present

## 2016-09-12 DIAGNOSIS — J9602 Acute respiratory failure with hypercapnia: Secondary | ICD-10-CM

## 2016-09-12 DIAGNOSIS — J9621 Acute and chronic respiratory failure with hypoxia: Secondary | ICD-10-CM | POA: Diagnosis present

## 2016-09-12 DIAGNOSIS — Z825 Family history of asthma and other chronic lower respiratory diseases: Secondary | ICD-10-CM

## 2016-09-12 DIAGNOSIS — J9620 Acute and chronic respiratory failure, unspecified whether with hypoxia or hypercapnia: Secondary | ICD-10-CM | POA: Diagnosis not present

## 2016-09-12 DIAGNOSIS — Z4682 Encounter for fitting and adjustment of non-vascular catheter: Secondary | ICD-10-CM | POA: Diagnosis not present

## 2016-09-12 DIAGNOSIS — Z880 Allergy status to penicillin: Secondary | ICD-10-CM | POA: Diagnosis not present

## 2016-09-12 DIAGNOSIS — J9622 Acute and chronic respiratory failure with hypercapnia: Secondary | ICD-10-CM | POA: Diagnosis present

## 2016-09-12 DIAGNOSIS — G9341 Metabolic encephalopathy: Secondary | ICD-10-CM | POA: Diagnosis present

## 2016-09-12 LAB — LACTIC ACID, PLASMA: Lactic Acid, Venous: 5.2 mmol/L (ref 0.5–1.9)

## 2016-09-12 LAB — CBC WITH DIFFERENTIAL/PLATELET
BASOS ABS: 0 10*3/uL (ref 0.0–0.1)
BASOS PCT: 0 %
Eosinophils Absolute: 0 10*3/uL (ref 0.0–0.7)
Eosinophils Relative: 0 %
HCT: 43.3 % (ref 36.0–46.0)
HEMOGLOBIN: 13.9 g/dL (ref 12.0–15.0)
LYMPHS PCT: 23 %
Lymphs Abs: 3.5 10*3/uL (ref 0.7–4.0)
MCH: 29.5 pg (ref 26.0–34.0)
MCHC: 32.1 g/dL (ref 30.0–36.0)
MCV: 91.9 fL (ref 78.0–100.0)
MONO ABS: 1 10*3/uL (ref 0.1–1.0)
MONOS PCT: 7 %
NEUTROS ABS: 10.4 10*3/uL — AB (ref 1.7–7.7)
NEUTROS PCT: 70 %
PLATELETS: 318 10*3/uL (ref 150–400)
RBC: 4.71 MIL/uL (ref 3.87–5.11)
RDW: 14.7 % (ref 11.5–15.5)
WBC: 14.9 10*3/uL — ABNORMAL HIGH (ref 4.0–10.5)

## 2016-09-12 LAB — RESPIRATORY PANEL BY PCR
Adenovirus: NOT DETECTED
Bordetella pertussis: NOT DETECTED
CORONAVIRUS 229E-RVPPCR: NOT DETECTED
CORONAVIRUS NL63-RVPPCR: NOT DETECTED
CORONAVIRUS OC43-RVPPCR: NOT DETECTED
Chlamydophila pneumoniae: NOT DETECTED
Coronavirus HKU1: NOT DETECTED
INFLUENZA B-RVPPCR: NOT DETECTED
Influenza A: NOT DETECTED
MYCOPLASMA PNEUMONIAE-RVPPCR: NOT DETECTED
Metapneumovirus: NOT DETECTED
PARAINFLUENZA VIRUS 2-RVPPCR: NOT DETECTED
PARAINFLUENZA VIRUS 4-RVPPCR: NOT DETECTED
Parainfluenza Virus 1: NOT DETECTED
Parainfluenza Virus 3: NOT DETECTED
RESPIRATORY SYNCYTIAL VIRUS-RVPPCR: NOT DETECTED
Rhinovirus / Enterovirus: NOT DETECTED

## 2016-09-12 LAB — COMPREHENSIVE METABOLIC PANEL
ALBUMIN: 3.5 g/dL (ref 3.5–5.0)
ALT: 12 U/L — ABNORMAL LOW (ref 14–54)
ANION GAP: 10 (ref 5–15)
AST: 26 U/L (ref 15–41)
Alkaline Phosphatase: 81 U/L (ref 38–126)
BUN: 5 mg/dL — ABNORMAL LOW (ref 6–20)
CO2: 35 mmol/L — AB (ref 22–32)
Calcium: 8.9 mg/dL (ref 8.9–10.3)
Chloride: 82 mmol/L — ABNORMAL LOW (ref 101–111)
Creatinine, Ser: 0.92 mg/dL (ref 0.44–1.00)
GFR calc non Af Amer: >60 mL/min (ref >60)
GLUCOSE: 303 mg/dL — AB (ref 65–99)
POTASSIUM: 4.6 mmol/L (ref 3.5–5.1)
SODIUM: 127 mmol/L — AB (ref 135–145)
Total Bilirubin: 0.6 mg/dL (ref 0.3–1.2)
Total Protein: 5.9 g/dL — ABNORMAL LOW (ref 6.5–8.1)

## 2016-09-12 LAB — BLOOD GAS, ARTERIAL
ACID-BASE EXCESS: 10.6 mmol/L — AB (ref 0.0–2.0)
Acid-Base Excess: 9.4 mmol/L — ABNORMAL HIGH (ref 0.0–2.0)
Bicarbonate: 38.3 mmol/L — ABNORMAL HIGH (ref 20.0–28.0)
Bicarbonate: 37.2 mmol/L — ABNORMAL HIGH (ref 20.0–28.0)
DRAWN BY: 449561
Drawn by: 441661
FIO2: 100
FIO2: 40
MECHVT: 420 mL
O2 SAT: 96.7 %
O2 Saturation: 98.8 %
PATIENT TEMPERATURE: 98.6
PATIENT TEMPERATURE: 95.7
PCO2 ART: 117 mmHg — AB (ref 32.0–48.0)
PCO2 ART: 72.7 mmHg — AB (ref 32.0–48.0)
PEEP/CPAP: 5 cmH2O
PEEP: 5 cmH2O
PH ART: 7.319 — AB (ref 7.350–7.450)
PO2 ART: 225 mmHg — AB (ref 83.0–108.0)
PO2 ART: 88.6 mmHg (ref 83.0–108.0)
RATE: 18 resp/min
RATE: 26 resp/min
VT: 420 mL
pH, Arterial: 7.141 — CL (ref 7.350–7.450)

## 2016-09-12 LAB — GLUCOSE, CAPILLARY
GLUCOSE-CAPILLARY: 193 mg/dL — AB (ref 65–99)
GLUCOSE-CAPILLARY: 158 mg/dL — AB (ref 65–99)
GLUCOSE-CAPILLARY: 113 mg/dL — AB (ref 65–99)
Glucose-Capillary: 105 mg/dL — ABNORMAL HIGH (ref 65–99)
Glucose-Capillary: 126 mg/dL — ABNORMAL HIGH (ref 65–99)

## 2016-09-12 LAB — URINALYSIS, ROUTINE W REFLEX MICROSCOPIC
Bilirubin Urine: NEGATIVE
Ketones, ur: NEGATIVE mg/dL
LEUKOCYTES UA: NEGATIVE
NITRITE: NEGATIVE
Protein, ur: 100 mg/dL — AB
SPECIFIC GRAVITY, URINE: 1.015 (ref 1.005–1.030)
pH: 6 (ref 5.0–8.0)

## 2016-09-12 LAB — BASIC METABOLIC PANEL
ANION GAP: 13 (ref 5–15)
BUN: 7 mg/dL (ref 6–20)
CHLORIDE: 87 mmol/L — AB (ref 101–111)
CO2: 29 mmol/L (ref 22–32)
Calcium: 8.8 mg/dL — ABNORMAL LOW (ref 8.9–10.3)
Creatinine, Ser: 0.85 mg/dL (ref 0.44–1.00)
GFR calc Af Amer: >60 mL/min (ref >60)
GLUCOSE: 126 mg/dL — AB (ref 65–99)
POTASSIUM: 4.6 mmol/L (ref 3.5–5.1)
Sodium: 129 mmol/L — ABNORMAL LOW (ref 135–145)

## 2016-09-12 LAB — I-STAT TROPONIN, ED: Troponin i, poc: 0.02 ng/mL (ref 0.00–0.08)

## 2016-09-12 LAB — BRAIN NATRIURETIC PEPTIDE: B NATRIURETIC PEPTIDE 5: 807.4 pg/mL — AB (ref 0.0–100.0)

## 2016-09-12 LAB — INFLUENZA PANEL BY PCR (TYPE A & B)
INFLAPCR: NEGATIVE
Influenza B By PCR: NEGATIVE

## 2016-09-12 LAB — MRSA PCR SCREENING: MRSA by PCR: NEGATIVE

## 2016-09-12 LAB — STREP PNEUMONIAE URINARY ANTIGEN: Strep Pneumo Urinary Antigen: NEGATIVE

## 2016-09-12 LAB — TRIGLYCERIDES: Triglycerides: 93 mg/dL (ref <150)

## 2016-09-12 MED ORDER — SODIUM CHLORIDE 0.9 % IV SOLN
250.0000 mL | INTRAVENOUS | Status: DC | PRN
  Administered 2016-09-14: 250 mL via INTRAVENOUS

## 2016-09-12 MED ORDER — PNEUMOCOCCAL VAC POLYVALENT 25 MCG/0.5ML IJ INJ
0.5000 mL | INJECTION | INTRAMUSCULAR | Status: DC
  Filled 2016-09-12: qty 0.5

## 2016-09-12 MED ORDER — ETOMIDATE 2 MG/ML IV SOLN
INTRAVENOUS | Status: DC | PRN
  Administered 2016-09-12: 20 mg via INTRAVENOUS

## 2016-09-12 MED ORDER — ALBUTEROL SULFATE (2.5 MG/3ML) 0.083% IN NEBU: 2.5000 mg | INHALATION_SOLUTION | RESPIRATORY_TRACT | Status: DC | PRN

## 2016-09-12 MED ORDER — MIDAZOLAM HCL 2 MG/2ML IJ SOLN: 1.0000 mg | INTRAMUSCULAR | Status: DC | PRN

## 2016-09-12 MED ORDER — SUCCINYLCHOLINE CHLORIDE 20 MG/ML IJ SOLN
INTRAMUSCULAR | Status: DC | PRN
  Administered 2016-09-12: 100 mg via INTRAVENOUS

## 2016-09-12 MED ORDER — HEPARIN SODIUM (PORCINE) 5000 UNIT/ML IJ SOLN
5000.0000 [IU] | Freq: Three times a day (TID) | INTRAMUSCULAR | Status: DC
  Administered 2016-09-12 – 2016-09-15 (×9): 5000 [IU] via SUBCUTANEOUS
  Filled 2016-09-12 (×10): qty 1

## 2016-09-12 MED ORDER — ORAL CARE MOUTH RINSE: 15.0000 mL | Freq: Four times a day (QID) | OROMUCOSAL | Status: DC

## 2016-09-12 MED ORDER — ONDANSETRON HCL 4 MG/2ML IJ SOLN: 4.0000 mg | Freq: Four times a day (QID) | INTRAMUSCULAR | Status: DC | PRN

## 2016-09-12 MED ORDER — FUROSEMIDE 10 MG/ML IJ SOLN
40.0000 mg | Freq: Two times a day (BID) | INTRAMUSCULAR | Status: DC
  Administered 2016-09-12 – 2016-09-15 (×7): 40 mg via INTRAVENOUS
  Filled 2016-09-12 (×8): qty 4

## 2016-09-12 MED ORDER — FENTANYL CITRATE (PF) 100 MCG/2ML IJ SOLN
50.0000 ug | INTRAMUSCULAR | Status: DC | PRN
  Administered 2016-09-12: 50 ug via INTRAVENOUS
  Filled 2016-09-12: qty 2

## 2016-09-12 MED ORDER — FAMOTIDINE IN NACL 20-0.9 MG/50ML-% IV SOLN
20.0000 mg | Freq: Two times a day (BID) | INTRAVENOUS | Status: DC
  Administered 2016-09-12 (×2): 20 mg via INTRAVENOUS
  Filled 2016-09-12 (×3): qty 50

## 2016-09-12 MED ORDER — ORAL CARE MOUTH RINSE
15.0000 mL | Freq: Two times a day (BID) | OROMUCOSAL | Status: DC
  Administered 2016-09-12 – 2016-09-13 (×2): 15 mL via OROMUCOSAL

## 2016-09-12 MED ORDER — DOCUSATE SODIUM 50 MG/5ML PO LIQD
100.0000 mg | Freq: Two times a day (BID) | ORAL | Status: DC | PRN
  Filled 2016-09-12: qty 10

## 2016-09-12 MED ORDER — FLUTICASONE FUROATE-VILANTEROL 200-25 MCG/INH IN AEPB: 1.0000 | INHALATION_SPRAY | Freq: Every day | RESPIRATORY_TRACT | Status: DC

## 2016-09-12 MED ORDER — CHLORHEXIDINE GLUCONATE 0.12% ORAL RINSE (MEDLINE KIT): 15.0000 mL | Freq: Two times a day (BID) | OROMUCOSAL | Status: DC

## 2016-09-12 MED ORDER — PROPOFOL 1000 MG/100ML IV EMUL
0.0000 ug/kg/min | INTRAVENOUS | Status: DC
  Administered 2016-09-12: 30 ug/kg/min via INTRAVENOUS
  Filled 2016-09-12: qty 100

## 2016-09-12 MED ORDER — SODIUM CHLORIDE 0.9 % IV SOLN
INTRAVENOUS | Status: DC
  Administered 2016-09-12 – 2016-09-13 (×2): via INTRAVENOUS

## 2016-09-12 MED ORDER — FENTANYL CITRATE (PF) 100 MCG/2ML IJ SOLN: 50.0000 ug | INTRAMUSCULAR | Status: DC | PRN

## 2016-09-12 MED ORDER — METHYLPREDNISOLONE SODIUM SUCC 40 MG IJ SOLR
40.0000 mg | Freq: Two times a day (BID) | INTRAMUSCULAR | Status: DC
  Administered 2016-09-12 (×2): 40 mg via INTRAVENOUS
  Filled 2016-09-12 (×3): qty 1

## 2016-09-12 MED ORDER — BUDESONIDE 0.5 MG/2ML IN SUSP
0.5000 mg | Freq: Two times a day (BID) | RESPIRATORY_TRACT | Status: DC
  Administered 2016-09-12 – 2016-09-15 (×7): 0.5 mg via RESPIRATORY_TRACT
  Filled 2016-09-12 (×9): qty 2

## 2016-09-12 MED ORDER — NICOTINE 14 MG/24HR TD PT24
14.0000 mg | MEDICATED_PATCH | Freq: Every day | TRANSDERMAL | Status: DC
  Administered 2016-09-12 – 2016-09-14 (×3): 14 mg via TRANSDERMAL
  Filled 2016-09-12 (×3): qty 1

## 2016-09-12 MED ORDER — CHLORHEXIDINE GLUCONATE 0.12% ORAL RINSE (MEDLINE KIT)
15.0000 mL | Freq: Two times a day (BID) | OROMUCOSAL | Status: DC
  Administered 2016-09-12: 15 mL via OROMUCOSAL

## 2016-09-12 MED ORDER — INSULIN ASPART 100 UNIT/ML ~~LOC~~ SOLN
0.0000 [IU] | SUBCUTANEOUS | Status: DC
  Administered 2016-09-12: 1 [IU] via SUBCUTANEOUS
  Administered 2016-09-12 (×2): 2 [IU] via SUBCUTANEOUS
  Administered 2016-09-13: 1 [IU] via SUBCUTANEOUS

## 2016-09-12 MED ORDER — LEVOFLOXACIN IN D5W 750 MG/150ML IV SOLN
750.0000 mg | INTRAVENOUS | Status: DC
  Administered 2016-09-12 – 2016-09-14 (×3): 750 mg via INTRAVENOUS
  Filled 2016-09-12 (×5): qty 150

## 2016-09-12 MED ORDER — METOPROLOL TARTRATE 5 MG/5ML IV SOLN
5.0000 mg | Freq: Once | INTRAVENOUS | Status: AC
  Administered 2016-09-12: 5 mg via INTRAVENOUS
  Filled 2016-09-12: qty 5

## 2016-09-12 MED ORDER — PROPOFOL 1000 MG/100ML IV EMUL
5.0000 ug/kg/min | INTRAVENOUS | Status: DC
  Administered 2016-09-12 (×2): 5 ug/kg/min via INTRAVENOUS

## 2016-09-12 MED ORDER — ORAL CARE MOUTH RINSE
15.0000 mL | Freq: Four times a day (QID) | OROMUCOSAL | Status: DC
  Administered 2016-09-12: 15 mL via OROMUCOSAL

## 2016-09-12 MED ORDER — HYDRALAZINE HCL 20 MG/ML IJ SOLN: 10.0000 mg | Freq: Four times a day (QID) | INTRAMUSCULAR | Status: DC | PRN

## 2016-09-12 MED ORDER — IPRATROPIUM-ALBUTEROL 0.5-2.5 (3) MG/3ML IN SOLN
3.0000 mL | Freq: Four times a day (QID) | RESPIRATORY_TRACT | Status: DC
  Administered 2016-09-12 – 2016-09-13 (×6): 3 mL via RESPIRATORY_TRACT
  Filled 2016-09-12 (×6): qty 3

## 2016-09-12 MED ORDER — PROPOFOL 1000 MG/100ML IV EMUL
INTRAVENOUS | Status: AC
  Administered 2016-09-12: 5 ug/kg/min via INTRAVENOUS
  Filled 2016-09-12: qty 100

## 2016-09-12 MED ORDER — MIDAZOLAM HCL 2 MG/2ML IJ SOLN
1.0000 mg | INTRAMUSCULAR | Status: DC | PRN
  Filled 2016-09-12: qty 2

## 2016-09-12 NOTE — Progress Notes (Signed)
MD Preston FleetingGlick wants VT 500  And RR from 18 to 20 per Marked Critical ABG results. Ventilator settings changed according to MD verbal orders.

## 2016-09-12 NOTE — Procedures (Signed)
Extubation Procedure Note  Patient Details:   Name: Samantha Dyer DOB: 07-Feb-1951 MRN: [id number]   Airway Documentation: Pt following commands.  Extubated to 4 lpm [place] sat 100%.  Pt able to speak and ask for  A Dr. Reino KentPepper to drink.  Strong cough.  IS able to do 10 x 1000cc    Evaluation  O2 sats: stable throughout Complications: No apparent complications Patient did tolerate procedure well. Bilateral Breath Sounds: Diminished   Yes  Richmond CampbellHall, Ling Flesch Lynn 09/12/2016, 12:58 PM

## 2016-09-12 NOTE — H&P (Signed)
Name: Samantha Dyer MRN: [id number] DOB: 03/06/1951    LOS: 0  PCCM ADMISSION NOTE  History of Present Illness: Patient is a 66 yo F pmhx of COPD (gold stage 4) and current smoker who was recently hospitalization for COPD exacerbation in November requiring intubation. She presents today via EMS for acute on chronic respiratory failure. History was provided entirely by chart review. EMS found patient sitting on the edge of her bed struggling to breath. She became unresponsive en route and was bagged. She was given albuterol, Atrovent, IV solumedrol, and 2 SL nitro prior to arriving to the ED. She was urgently intubated on arrival. No family present at bedside.  On evaluation in the ED, Patient was sedated on the vent but able to open her eyes to voice and follow simple commands. She answered simple yes / no questions. She endorsed worsening SOB and non productive cough. Denies N/V/D. She denied fevers. She denied weight gain. She shook her head no when asked if she would like [place] to call family. She would like to be FULL code.   Lines / Drains: ETT 1/23 >>   Cultures: Urine 1/23 >>  Blood 1/23 >> RVP 1/23 >> Influenza PCR 1/23 >> Strep Pneumo Ag 1/23 >> Legionella Ag 1/23 >>   Antibiotics: Levaquin 1/23 >>   Tests / Events: CXR 1/23 >> ? Interstitial edema   The patient is sedated, intubated and unable to provide history, which was obtained for available medical records.    Past Medical History:  Diagnosis Date  . Allergic rhinitis, cause unspecified   . Anemia, unspecified   . Backache, unspecified   . COPD (chronic obstructive pulmonary disease) (HCC)   . Esophageal reflux   . Iron deficiency anemia, unspecified    History reviewed. No pertinent surgical history. Prior to Admission medications   Medication Sig Start Date End Date Taking? Authorizing Provider  BREO ELLIPTA 200-25 MCG/INH AEPB Inhale 1 puff into the lungs daily.  08/13/15   Historical Provider, MD   COMBIVENT RESPIMAT 20-100 MCG/ACT AERS respimat Inhale 1 puff into the lungs every 6 (six) hours as needed for wheezing or shortness of breath.  06/09/16   Historical Provider, MD  esomeprazole (NEXIUM) 20 MG capsule Take 20 mg by mouth daily.     Historical Provider, MD  fluticasone furoate-vilanterol (BREO ELLIPTA) 200-25 MCG/INH AEPB Inhale 1 puff into the lungs daily. 07/27/16   [name], NP  guaiFENesin (MUCINEX) 600 MG 12 hr tablet Take 1 tablet (600 mg total) by mouth 2 (two) times daily as needed for to loosen phlegm. 07/09/16   Calvert Cantor, MD  ibuprofen (ADVIL,MOTRIN) 200 MG tablet Take 200 mg by mouth every 6 (six) hours as needed for moderate pain.     Historical Provider, MD  ipratropium-albuterol (DUONEB) 0.5-2.5 (3) MG/3ML SOLN Inhale 3 mLs into the lungs every 4 (four) hours as needed (Shortness of breath).  09/02/15   Historical Provider, MD  nystatin (MYCOSTATIN) 100000 UNIT/ML suspension Take 5 mLs by mouth 4 (four) times daily.    Historical Provider, MD  OXYGEN Inhale into the lungs continuous. 2 L    Historical Provider, MD  predniSONE (DELTASONE) 10 MG tablet Take 4 tabs  daily with food x 4 days, then 3 tabs daily x 4 days, then 2 tabs daily x 4 days, then 1 tab daily x4 days then stop. #40 07/12/16   Vilinda Blanks Minor, NP  sodium chloride 1 g tablet Take 2 tablets (2 g  total) by mouth 2 (two) times daily. 07/09/16   Calvert Cantor, MD  tiotropium (SPIRIVA HANDIHALER) 18 MCG inhalation capsule once daily 03/15/16   Historical Provider, MD   Allergies Allergies  Allergen Reactions  . Amoxicillin-Pot Clavulanate Swelling  . Prednisone Other (See Comments)    "drives her crazy"    Family History Family History  Problem Relation Age of Onset  . Emphysema Mother   . Heart disease Mother   . Breast cancer Mother   . Pancreatic cancer Father     Social History  reports that she has been smoking Cigarettes.  She has a 43.00 pack-year smoking history. She has never  used smokeless tobacco. She reports that she does not drink alcohol or use drugs.  Review Of Systems  11 points review of systems is negative with an exception of listed in HPI.  Vital Signs: Temp:  [98.1 F (36.7 C)] 98.1 F (36.7 C) (01/23 0234) Pulse Rate:  [104-124] 105 (01/23 0315) Resp:  [17-26] 18 (01/23 0315) BP: (156-208)/(97-138) 156/138 (01/23 0315) SpO2:  [88 %-100 %] 99 % (01/23 0315) FiO2 (%):  [100 %] 100 % (01/23 0308) Weight:  [79.8 kg (176 lb)] 79.8 kg (176 lb) (01/23 0235) No intake/output data recorded.  Physical Examination: General:  NAD Neuro:  RASS -1, opens eyes, follows commands   HEENT:  Endotracheal tube in place Neck:  Supple, trachea midline    Cardiovascular:  Tachycardic, regular rhythm, no m/r/g Lungs:  Course breath sounds bilaterally  Abdomen:  Soft, non tender, non distended  Extremities: 1+ edema above the ankles bilaterally    Skin:  No rashes or erythema   Ventilator settings: Vent Mode: PRVC FiO2 (%):  [100 %] 100 % Set Rate:  [18 bmp] 18 bmp Vt Set:  [420 mL] 420 mL PEEP:  [5 cmH20] 5 cmH20  Labs and Imaging:  Reviewed.  Please refer to the Assessment and Plan section for relevant results.  Assessment and Plan: Patient is 66 yo F with pmhx of severe COPD (gold stage 4) who presents with acute on chronic hypercarbic respiratory failure, intubated on arrival to the ED. Likely due to COPD exacerbation and ? CAP.  PULMONARY  ASSESSMENT: Acute on Chronic Hypercarbic Respiratory Failure ? CAP Hx COPD PLAN:   Full vent support Abx below  Duonebs q6  Albuterol q2 prn  Pulmicort daily  Solumedrol 40 BID Follow Respiratory cultures Follow RVP ABG     CARDIOVASCULAR  ASSESSMENT:  Hypertensive  Sinus Tach  PLAN:  Hydralazine 10 q6 prn SBP > 180 and or diastolic > 110  RENAL  ASSESSMENT:   Chronic hyponatremia - at baseline  PLAN:   NS @ 75 Hold home salt tabs   GASTROINTESTINAL  ASSESSMENT:   No acute issues   PLAN:   NPO IV Pepcid BID  HEMATOLOGIC  ASSESSMENT:   Leukocytosis  PLAN:  Empiric Abx  Sub q heparin for ppx   INFECTIOUS  ASSESSMENT:   ? CAP ? COPD exacerbation  PLAN:   Levaquin 1/23 >> Follow cultures  Checking Legionella urine antigen  Checking Strep Pneumo urine antigen   ENDOCRINE  ASSESSMENT:   Hyperglycemia    PLAN:   SSI Follow CBGs  NEUROLOGIC  ASSESSMENT:   Acute encephalopathy secondary to acute on chronic hypercarbic respiratory failure  PLAN:   Full vent support  RASS goal -1  Propofol drip  Fentanyl prn   Family Update: Patient did not want [place] to contact family.   The patient  is critically ill with multiple organ systems failure and requires high complexity decision making for assessment and support, frequent evaluation and titration of therapies, application of advanced monitoring technologies and extensive interpretation of multiple databases. Critical Care Time devoted to patient care services described in this note is 35 minutes.  Reymundo Pollarolyn Guilloud, M.D. Pager: [phone] 09/12/2016, 4:27 AM

## 2016-09-12 NOTE — ED Triage Notes (Signed)
Pt brought to ED by [facility] from home for respiratory distress no air movement with agonal breading NOP placed by [facility], Albuterol, Atrovent bag in pat 125 mg  Solumedrol IV given, 2 nitro sl, ST on monitor  BP-165/98, HR 110, SPO2 89 on RA, 90% when bagging her.

## 2016-09-12 NOTE — ED Provider Notes (Signed)
[facility] DEPT Provider Note   CSN: [id number] Arrival date & time: 09/12/16  6789   By signing my name below, I, Samantha Dyer, attest that this documentation has been prepared under the direction and in the presence of Samantha Fuel, MD . Electronically Signed: Dolores Dyer, Scribe. 09/12/2016. 2:29 AM.  History   Chief Complaint Chief Complaint  Patient presents with  . Respiratory Distress    unresponsive   HPI  LEVEL 5 CAVEAT DUE TO PATIENT UNRESPONSIVENESS  HPI Comments:  Samantha Dyer is a 66 y.o. female brought in by ambulance, who presents to the Emergency Department with difficulty breathing. Pt is unresponsive. EMS report that on site the pt was sitting on the edge of her bed struggling to breath. En route, the pt became unresponsive and was bagged. Pt was given solumedrol and 2 nitro en route with minimal change in responsiveness.   Atomidate given at 2:36AM  Past Medical History:  Diagnosis Date  . Allergic rhinitis, cause unspecified   . Anemia, unspecified   . Backache, unspecified   . COPD (chronic obstructive pulmonary disease) ([place])   . Esophageal reflux   . Iron deficiency anemia, unspecified     Patient Active Problem List   Diagnosis Date Noted  . Acute respiratory failure with hypoxemia ([place]) 07/10/2016  . Hypomagnesemia 07/08/2016  . Hyponatremia 07/07/2016  . Acute respiratory failure with hypoxia ([place]) 07/07/2016  . COPD (chronic obstructive pulmonary disease) ([place]) 07/07/2016  . COPD exacerbation ([place]) 08/28/2015  . TOBACCO ABUSE 07/26/2010  . Chronic respiratory failure ([place]) 07/26/2010  . UNSPECIFIED IRON DEFICIENCY ANEMIA 07/25/2010  . UNSPECIFIED ANEMIA 07/25/2010  . ALLERGIC RHINITIS CAUSE UNSPECIFIED 07/25/2010  . C O P D 07/25/2010  . Esophageal reflux 07/25/2010  . UNSPECIFIED BACKACHE 07/25/2010    No past surgical history on file.  OB History    No data available       Home Medications    Prior to Admission  medications   Medication Sig Start Date End Date Taking? Authorizing Provider  BREO ELLIPTA 200-25 MCG/INH AEPB Inhale 1 puff into the lungs daily.  08/13/15   Historical Provider, MD  COMBIVENT RESPIMAT 20-100 MCG/ACT AERS respimat Inhale 1 puff into the lungs every 6 (six) hours as needed for wheezing or shortness of breath.  06/09/16   Historical Provider, MD  esomeprazole (NEXIUM) 20 MG capsule Take 20 mg by mouth daily.     Historical Provider, MD  fluticasone furoate-vilanterol (BREO ELLIPTA) 200-25 MCG/INH AEPB Inhale 1 puff into the lungs daily. 07/27/16   [name], NP  guaiFENesin (MUCINEX) 600 MG 12 hr tablet Take 1 tablet (600 mg total) by mouth 2 (two) times daily as needed for to loosen phlegm. 07/09/16   [name], MD  ibuprofen (ADVIL,MOTRIN) 200 MG tablet Take 200 mg by mouth every 6 (six) hours as needed for moderate pain.     Historical Provider, MD  ipratropium-albuterol (DUONEB) 0.5-2.5 (3) MG/3ML SOLN Inhale 3 mLs into the lungs every 4 (four) hours as needed (Shortness of breath).  09/02/15   Historical Provider, MD  nystatin (MYCOSTATIN) 100000 UNIT/ML suspension Take 5 mLs by mouth 4 (four) times daily.    Historical Provider, MD  OXYGEN Inhale into the lungs continuous. 2 L    Historical Provider, MD  predniSONE (DELTASONE) 10 MG tablet Take 4 tabs  daily with food x 4 days, then 3 tabs daily x 4 days, then 2 tabs daily x 4 days, then 1 tab daily  x4 days then stop. #40 07/12/16   [name], NP  sodium chloride 1 g tablet Take 2 tablets (2 g total) by mouth 2 (two) times daily. 07/09/16   [name], MD  tiotropium (SPIRIVA HANDIHALER) 18 MCG inhalation capsule once daily 03/15/16   Historical Provider, MD    Family History Family History  Problem Relation Age of Onset  . Emphysema Mother   . Heart disease Mother   . Breast cancer Mother   . Pancreatic cancer Father     Social History Social History  Substance Use Topics  . Smoking status: Current  Every Day Smoker    Packs/day: 1.00    Years: 43.00    Types: Cigarettes  . Smokeless tobacco: Never Used  . Alcohol use No     Allergies   Amoxicillin-pot clavulanate and Prednisone   Review of Systems Review of Systems  Unable to perform ROS: Patient unresponsive     Physical Exam Updated Vital Signs BP (!) 204/115 (BP Location: Right Arm)   Pulse (!) 124   Temp 98.1 F (36.7 C) (Rectal)   Resp 22   Ht 5' 4"  (1.626 m)   Wt 176 lb (79.8 kg)   SpO2 98%   BMI 30.21 kg/m   Physical Exam  Constitutional:  Unresponsive with pre-agonal respirations.   Neck: JVD present.  JVD present  Pulmonary/Chest:  Markedly diminished air flow  Musculoskeletal:  2+ pretibial edema.  Neurological:  Unresponsive, no purposeful movement. No gross motor deficits.      ED Treatments / Results  DIAGNOSTIC STUDIES:  Oxygen Saturation is 98% on RA, normal by my interpretation.    COORDINATION OF CARE:    Labs (all labs ordered are listed, but only abnormal results are displayed) Labs Reviewed  COMPREHENSIVE METABOLIC PANEL - Abnormal; Notable for the following:       Result Value   Sodium 127 (*)    Chloride 82 (*)    CO2 35 (*)    Glucose, Bld 303 (*)    BUN 5 (*)    Total Protein 5.9 (*)    ALT 12 (*)    All other components within normal limits  CBC WITH DIFFERENTIAL/PLATELET - Abnormal; Notable for the following:    WBC 14.9 (*)    Neutro Abs 10.4 (*)    All other components within normal limits  BRAIN NATRIURETIC PEPTIDE - Abnormal; Notable for the following:    B Natriuretic Peptide 807.4 (*)    All other components within normal limits  BLOOD GAS, ARTERIAL - Abnormal; Notable for the following:    pH, Arterial 7.141 (*)    pCO2 arterial 117 (*)    pO2, Arterial 225 (*)    Bicarbonate 38.3 (*)    Acid-Base Excess 9.4 (*)    All other components within normal limits  URINALYSIS, ROUTINE W REFLEX MICROSCOPIC - Abnormal; Notable for the following:     APPearance CLOUDY (*)    Glucose, UA >=500 (*)    Hgb urine dipstick MODERATE (*)    Protein, ur 100 (*)    Bacteria, UA RARE (*)    Squamous Epithelial / LPF 6-30 (*)    Non Squamous Epithelial 0-5 (*)    All other components within normal limits  BASIC METABOLIC PANEL - Abnormal; Notable for the following:    Sodium 129 (*)    Chloride 87 (*)    Glucose, Bld 126 (*)    Calcium 8.8 (*)    All  other components within normal limits  BLOOD GAS, ARTERIAL - Abnormal; Notable for the following:    pH, Arterial 7.319 (*)    pCO2 arterial 72.7 (*)    Bicarbonate 37.2 (*)    Acid-Base Excess 10.6 (*)    All other components within normal limits  GLUCOSE, CAPILLARY - Abnormal; Notable for the following:    Glucose-Capillary 193 (*)    All other components within normal limits  LACTIC ACID, PLASMA - Abnormal; Notable for the following:    Lactic Acid, Venous 5.2 (*)    All other components within normal limits  GLUCOSE, CAPILLARY - Abnormal; Notable for the following:    Glucose-Capillary 158 (*)    All other components within normal limits  GLUCOSE, CAPILLARY - Abnormal; Notable for the following:    Glucose-Capillary 105 (*)    All other components within normal limits  GLUCOSE, CAPILLARY - Abnormal; Notable for the following:    Glucose-Capillary 113 (*)    All other components within normal limits  GLUCOSE, CAPILLARY - Abnormal; Notable for the following:    Glucose-Capillary 126 (*)    All other components within normal limits  RESPIRATORY PANEL BY PCR  MRSA PCR SCREENING  URINE CULTURE  CULTURE, RESPIRATORY (NON-EXPECTORATED)  CULTURE, BLOOD (ROUTINE X 2)  CULTURE, BLOOD (ROUTINE X 2)  TRIGLYCERIDES  STREP PNEUMONIAE URINARY ANTIGEN  INFLUENZA PANEL BY PCR (TYPE A & B)  LEGIONELLA PNEUMOPHILA SEROGP 1 UR AG  CBC  BASIC METABOLIC PANEL  MAGNESIUM  PHOSPHORUS  I-STAT TROPOININ, ED    EKG  EKG Interpretation  Date/Time:  Tuesday September 12 2016 02:30:27  EST Ventricular Rate:  121 PR Interval:    QRS Duration: 86 QT Interval:  269 QTC Calculation: 382 R Axis:   78 Text Interpretation:  Sinus tachycardia Probable left atrial enlargement Left ventricular hypertrophy Anterior infarct, acute (LAD) When compared with ECG of 07/10/2016, Premature ventricular complexes are no longer present Confirmed by Roxanne Mins  MD, Otis Burress ([id number]) on 09/12/2016 2:45:29 AM       Radiology Dg Chest Portable 1 View  Result Date: 09/12/2016 CLINICAL DATA:  66 year old female status post intubation. EXAM: PORTABLE CHEST 1 VIEW COMPARISON:  Chest radiograph dated 07/11/2016 FINDINGS: Endotracheal tube the tip approximately 2.5 cm above the carina. Recommend retraction by 2 cm for optimal positioning. An enteric tube is partially visualized coursing into the left hemiabdomen with tip beyond the inferior margin of the image. There is diffuse interstitial prominence and interlobular septal thickening with Kerley B-lines suggestive of mild interstitial edema. There is no focal consolidation, pleural effusion, or pneumothorax. The cardiac silhouette is within normal limits. No acute osseous pathology. IMPRESSION: Endotracheal tube approximately 2.5 cm above the carina. Interlobular septal prominence likely represent a degree of interstitial edema. Clinical correlation is recommended. Electronically Signed   By: Anner Crete M.D.   On: 09/12/2016 03:10    Procedures Procedure Name: Intubation Date/Time: 09/12/2016 2:45 AM Performed by: Roxanne Mins, Brandyn Lowrey Pre-anesthesia Checklist: Patient identified, Emergency Drugs available, Suction available, Patient being monitored and Timeout performed Oxygen Delivery Method: Ambu bag Preoxygenation: Pre-oxygenation with 100% oxygen Intubation Type: Rapid sequence, IV induction and Cricoid Pressure applied Ventilation: Mask ventilation without difficulty Laryngoscope Size: Mac, 3 and Glidescope Grade View: Grade I Tube size: 7.5 mm Number of  attempts: 1 Airway Equipment and Method: Rigid stylet Placement Confirmation: ETT inserted through vocal cords under direct vision,  Positive ETCO2,  CO2 detector and Breath sounds checked- equal and bilateral Secured at: 23 cm Tube  secured with: ETT holder Dental Injury: Teeth and Oropharynx as per pre-operative assessment       (including critical care time) CRITICAL CARE Performed by: KXFGH,WEXHB Total critical care time: 60 minutes Critical care time was exclusive of separately billable procedures and treating other patients. Critical care was necessary to treat or prevent imminent or life-threatening deterioration. Critical care was time spent personally by me on the following activities: development of treatment plan with patient and/or surrogate as well as nursing, discussions with consultants, evaluation of patient's response to treatment, examination of patient, obtaining history from patient or surrogate, ordering and performing treatments and interventions, ordering and review of laboratory studies, ordering and review of radiographic studies, pulse oximetry and re-evaluation of patient's condition.  Medications Ordered in ED Medications  etomidate (AMIDATE) injection (20 mg Intravenous Given 09/12/16 0237)  succinylcholine (ANECTINE) injection (100 mg Intravenous Given 09/12/16 0237)  0.9 %  sodium chloride infusion (not administered)  heparin injection 5,000 Units (5,000 Units Subcutaneous Given 09/12/16 2049)  famotidine (PEPCID) IVPB 20 mg premix (20 mg Intravenous Given 09/12/16 2048)  ondansetron (ZOFRAN) injection 4 mg (not administered)  albuterol (PROVENTIL) (2.5 MG/3ML) 0.083% nebulizer solution 2.5 mg (not administered)  ipratropium-albuterol (DUONEB) 0.5-2.5 (3) MG/3ML nebulizer solution 3 mL (3 mLs Nebulization Given 09/12/16 2018)  fentaNYL (SUBLIMAZE) injection 50 mcg (50 mcg Intravenous Given 09/12/16 0429)  fentaNYL (SUBLIMAZE) injection 50 mcg (not administered)    docusate (COLACE) 50 MG/5ML liquid 100 mg (not administered)  0.9 %  sodium chloride infusion ( Intravenous Canceled Entry 09/12/16 2100)  hydrALAZINE (APRESOLINE) injection 10 mg (not administered)  methylPREDNISolone sodium succinate (SOLU-MEDROL) 40 mg/mL injection 40 mg (40 mg Intravenous Given 09/12/16 2049)  budesonide (PULMICORT) nebulizer solution 0.5 mg (0.5 mg Nebulization Given 09/12/16 2020)  levofloxacin (LEVAQUIN) IVPB 750 mg (750 mg Intravenous Given 09/12/16 0546)  nicotine (NICODERM CQ - dosed in mg/24 hours) patch 14 mg (14 mg Transdermal Patch Applied 09/12/16 0901)  insulin aspart (novoLOG) injection 0-9 Units (1 Units Subcutaneous Given 09/12/16 2049)  pneumococcal 23 valent vaccine (PNU-IMMUNE) injection 0.5 mL (not administered)  furosemide (LASIX) injection 40 mg (40 mg Intravenous Given 09/12/16 1711)  MEDLINE mouth rinse (15 mLs Mouth Rinse Given 09/12/16 2200)  metoprolol (LOPRESSOR) injection 5 mg (5 mg Intravenous Given 09/12/16 2049)     Initial Impression / Assessment and Plan / ED Course  I have reviewed the triage vital signs and the nursing notes.  Pertinent labs & imaging results that were available during my care of the patient were reviewed by me and considered in my medical decision making (see chart for details).  Patient brought in unresponsive with ventilation being done by bag-valve-mask. She was markedly hypertensive. Old records were reviewed and she does have history of COPD and admission for hypercarbia. Initial oxygen saturation was actually high. She was given an opportunity to see if her respirations with improvement of oxygen levels were decreased. However, her respiratory rate effort did not improve. She was intubated in the ED. Following intubation, blood pressure actually decreased. Chest x-ray showed no evidence of pneumonia. Case is discussed with Dr. Elsworth Soho of critical care service who agrees to admit the patient.  Final Clinical Impressions(s) /  ED Diagnoses   Final diagnoses:  None    New Prescriptions New Prescriptions   No medications on file   I personally performed the services described in this documentation, which was scribed in my presence. The recorded information has been reviewed and is accurate.  Samantha Fuel, MD 03/54/65 [id number]

## 2016-09-12 NOTE — Progress Notes (Signed)
Nutrition Brief Note  Received MD Consult for TF initiation and management. Patient has been extubated, and TF no longer needed. Patient also identified on the Malnutrition Screening Tool (MST) Report. Per review of usual weights below, weight loss over the past year has not been significant and weight has been stable for at least the past few months.  Wt Readings from Last 5 Encounters:  09/12/16 154 lb 12.2 oz (70.2 kg)  07/13/16 154 lb 9.6 oz (70.1 kg)  07/09/16 154 lb 5.2 oz (70 kg)  11/25/15 160 lb (72.6 kg)  09/06/15 164 lb (74.4 kg)    Body mass index is 24.24 kg/m. Patient meets criteria for normal weight based on current BMI.   Current diet order is NPO as patient was just extubated, hopeful for diet advancement later today. Labs and medications reviewed.   No nutrition interventions warranted at this time. If nutrition issues arise, please consult RD.   Joaquin CourtsKimberly Harris, RD, LDN, CNSC Pager [phone] After Hours Pager [phone]

## 2016-09-13 ENCOUNTER — Inpatient Hospital Stay (HOSPITAL_COMMUNITY): Payer: Medicare Other

## 2016-09-13 DIAGNOSIS — J9620 Acute and chronic respiratory failure, unspecified whether with hypoxia or hypercapnia: Secondary | ICD-10-CM

## 2016-09-13 LAB — CBC
HCT: 37.3 % (ref 36.0–46.0)
Hemoglobin: 12.2 g/dL (ref 12.0–15.0)
MCH: 29.4 pg (ref 26.0–34.0)
MCHC: 32.7 g/dL (ref 30.0–36.0)
MCV: 89.9 fL (ref 78.0–100.0)
PLATELETS: 159 10*3/uL (ref 150–400)
RBC: 4.15 MIL/uL (ref 3.87–5.11)
RDW: 14.7 % (ref 11.5–15.5)
WBC: 9.3 10*3/uL (ref 4.0–10.5)

## 2016-09-13 LAB — BASIC METABOLIC PANEL
ANION GAP: 9 (ref 5–15)
BUN: 8 mg/dL (ref 6–20)
CALCIUM: 8.6 mg/dL — AB (ref 8.9–10.3)
CO2: 39 mmol/L — ABNORMAL HIGH (ref 22–32)
CREATININE: 0.85 mg/dL (ref 0.44–1.00)
Chloride: 83 mmol/L — ABNORMAL LOW (ref 101–111)
GLUCOSE: 128 mg/dL — AB (ref 65–99)
Potassium: 4 mmol/L (ref 3.5–5.1)
Sodium: 131 mmol/L — ABNORMAL LOW (ref 135–145)

## 2016-09-13 LAB — GLUCOSE, CAPILLARY
GLUCOSE-CAPILLARY: 100 mg/dL — AB (ref 65–99)
GLUCOSE-CAPILLARY: 114 mg/dL — AB (ref 65–99)
GLUCOSE-CAPILLARY: 88 mg/dL (ref 65–99)
Glucose-Capillary: 119 mg/dL — ABNORMAL HIGH (ref 65–99)
Glucose-Capillary: 140 mg/dL — ABNORMAL HIGH (ref 65–99)

## 2016-09-13 LAB — PHOSPHORUS: PHOSPHORUS: 5 mg/dL — AB (ref 2.5–4.6)

## 2016-09-13 LAB — LEGIONELLA PNEUMOPHILA SEROGP 1 UR AG: L. PNEUMOPHILA SEROGP 1 UR AG: NEGATIVE

## 2016-09-13 LAB — URINE CULTURE: Culture: NO GROWTH

## 2016-09-13 LAB — MAGNESIUM: Magnesium: 1.8 mg/dL (ref 1.7–2.4)

## 2016-09-13 MED ORDER — METHYLPREDNISOLONE SODIUM SUCC 40 MG IJ SOLR
40.0000 mg | Freq: Every day | INTRAMUSCULAR | Status: DC
  Administered 2016-09-13: 40 mg via INTRAVENOUS
  Filled 2016-09-13 (×2): qty 1

## 2016-09-13 MED ORDER — LEVALBUTEROL HCL 0.63 MG/3ML IN NEBU
0.6300 mg | INHALATION_SOLUTION | Freq: Three times a day (TID) | RESPIRATORY_TRACT | Status: DC
  Administered 2016-09-13 – 2016-09-15 (×5): 0.63 mg via RESPIRATORY_TRACT
  Filled 2016-09-13 (×5): qty 3

## 2016-09-13 MED ORDER — PANTOPRAZOLE SODIUM 40 MG PO TBEC
40.0000 mg | DELAYED_RELEASE_TABLET | Freq: Every day | ORAL | Status: DC
  Administered 2016-09-13 – 2016-09-14 (×2): 40 mg via ORAL
  Filled 2016-09-13 (×2): qty 1

## 2016-09-13 MED ORDER — LEVALBUTEROL HCL 0.63 MG/3ML IN NEBU
0.6300 mg | INHALATION_SOLUTION | Freq: Three times a day (TID) | RESPIRATORY_TRACT | Status: DC
  Administered 2016-09-13: 0.63 mg via RESPIRATORY_TRACT
  Filled 2016-09-13 (×2): qty 3

## 2016-09-13 NOTE — Progress Notes (Signed)
Given report to 6N and pt transferred via wheelchair and O2.

## 2016-09-13 NOTE — Progress Notes (Signed)
PULMONARY  / CRITICAL CARE MEDICINE  Name: Samantha Dyer MRN: [name] DOB: [birth year]    LOS: 1  CHIEF COMPLAINT:  SOB  BRIEF PATIENT DESCRIPTION: Patient is a 66 yo F pmhx of COPD (gold stage 4) and current smoker who was recently hospitalization for COPD exacerbation in November requiring intubation. She presents today via EMS for acute on chronic respiratory failure. History was provided entirely by chart review. EMS found patient sitting on the edge of her bed struggling to breath. She became unresponsive en route and was bagged. She was given albuterol, Atrovent, IV solumedrol, and 2 SL nitro prior to arriving to the ED. She was urgently intubated on arrival. No family present at bedside.  LINES / TUBES: ETT 1/23>>1/23  CULTURES: Urine 1/23 >>  Blood 1/23 >> RVP 1/23 >>Negative Influenza PCR 1/23 >>Negative Strep Pneumo Ag 1/23 >>Negative Legionella Ag 1/23 >>   ANTIBIOTICS: Levaquin 1/23 >>  SIGNIFICANT EVENTS:  Intubated 1/23 Extubated 1/23                                   INTERVAL HISTORY: Pt. Was extubated yesterday.Overnight had sinus tachy. Requiring one dose of metoprolol 5mg .  VITAL SIGNS: Temp:  [98.1 F (36.7 C)-99.5 F (37.5 C)] 98.1 F (36.7 C) (01/24 0600) Pulse Rate:  [92-121] 92 (01/24 0600) Resp:  [12-34] 22 (01/24 0600) BP: (113-150)/(58-102) 143/74 (01/24 0500) SpO2:  [88 %-100 %] 98 % (01/24 0600) FiO2 (%):  [40 %] 40 % (01/23 1200) Weight:  [68.7 kg (151 lb 7.3 oz)] 68.7 kg (151 lb 7.3 oz) (01/24 0500) HEMODYNAMICS:   INTAKE / OUTPUT: Intake/Output      01/23 0701 - 01/24 0700 01/24 0701 - 01/25 0700   I.V. (mL/kg) 1758.4 (25.6)    Other     IV Piggyback 250    Total Intake(mL/kg) 2008.4 (29.2)    Urine (mL/kg/hr) 4320 (2.6)    Total Output 4320     Net -2311.6            PHYSICAL EXAMINATION: General:  NAD Neuro:  A & O, No focal deficit. HEENT:  PERRL,  Cardiovascular: Sinus Tachycardia,No R/M/G Lungs:  Decreased  breath sounds at bases B/L. Abdomen:  Soft, non tender, BS +ve Musculoskeletal:  Trace pedal edema Skin:  Dry and intact.   LABS: Cbc  Recent Labs Lab 09/12/16 0245 09/13/16 0258  WBC 14.9* 9.3  HGB 13.9 12.2  HCT 43.3 37.3  PLT 318 159    Chemistry   Recent Labs Lab 09/12/16 0245 09/12/16 0849 09/13/16 0258  NA 127* 129* 131*  K 4.6 4.6 4.0  CL 82* 87* 83*  CO2 35* 29 39*  BUN 5* 7 8  CREATININE 0.92 0.85 0.85  CALCIUM 8.9 8.8* 8.6*  MG  --   --  1.8  PHOS  --   --  5.0*  GLUCOSE 303* 126* 128*    Liver fxn  Recent Labs Lab 09/12/16 0245  AST 26  ALT 12*  ALKPHOS 81  BILITOT 0.6  PROT 5.9*  ALBUMIN 3.5   coags No results for input(s): APTT, INR in the last 168 hours. Sepsis markers  Recent Labs Lab 09/12/16 0849  LATICACIDVEN 5.2*   Cardiac markers No results for input(s): CKTOTAL, CKMB, TROPONINI in the last 168 hours. BNP No results for input(s): PROBNP in the last 168 hours. ABG  Recent Labs Lab 09/12/16 0325 09/12/16 0516  PHART 7.141* 7.319*  PCO2ART 117* 72.7*  PO2ART 225* 88.6  HCO3 38.3* 37.2*    CBG trend  Recent Labs Lab 09/12/16 1127 09/12/16 1512 09/12/16 2040 09/13/16 0040 09/13/16 0356  GLUCAP 105* 113* 126* 114* 119*    IMAGING:  ECG:  DIAGNOSES: Active Problems:   Acute on chronic respiratory failure (HCC)   ASSESSMENT / PLAN:  PULMONARY  ASSESSMENT: Acute on chronic respiratory failure (HCC) CAP Hx COPD  PLAN:   O2 at 2 L via nasal canula Continue Abx Duonebs q6  Albuterol q2 prn  Pulmicort daily  Solumedrol 40 BID  CARDIOVASCULAR  ASSESSMENT:  Sinus Tachy. Pul. edema HTN  PLAN:  Continue Lasix 40 mg BID Hydralazine 10 q6 prn SBP > 180 and or diastolic > 110 Consider adding metoprolol  RENAL  ASSESSMENT:  Chronic hyponatremia - at baseline   PLAN:   D/c NS Can restart salt tablets.  GASTROINTESTINAL  ASSESSMENT:  No acute issues   PLAN:   Regular Diet Pepcid  20 mg BID  HEMATOLOGIC  ASSESSMENT:   Leukocytosis- Improving  PLAN:  Continue Abx. Sub q heparin for ppx   INFECTIOUS  ASSESSMENT:   CAP  COPD exacerbation   PLAN:   Levaquin 1/23 >> Follow cultures  Checking Legionella urine antigen- still pending  ENDOCRINE  ASSESSMENT:  Hyperglycemia  , no h/o DM, most likely due to steroids.   PLAN:   SSI Follow CBGs  NEUROLOGIC  ASSESSMENT:  No current Issue Acute Encephalopathy- resolved  PLAN:   D/c Fentanyl and Propofol  Arnetha Courser, MD  Internal Medicine, PGY1 Pager: [phone]  09/13/2016, 7:42 AM

## 2016-09-13 NOTE — Progress Notes (Signed)
eLink Physician-Brief Progress Note Patient Name: Samantha Dyer DOB: [birth year] MRN: [id number]   Date of Service  09/13/2016  HPI/Events of Note  Patient seen. Comfortable on nasal cannula oxygen.   Blood pressure 140/70, heart rate 110, respiratory rate 20, 94% O2 saturation on [place]  eICU Interventions  Cont to observe.      Intervention Category Intermediate Interventions: Other:  Louann SjogrenJose Angelo A De Dios 09/13/2016, 4:52 PM

## 2016-09-14 DIAGNOSIS — J441 Chronic obstructive pulmonary disease with (acute) exacerbation: Principal | ICD-10-CM

## 2016-09-14 DIAGNOSIS — K219 Gastro-esophageal reflux disease without esophagitis: Secondary | ICD-10-CM

## 2016-09-14 DIAGNOSIS — F17200 Nicotine dependence, unspecified, uncomplicated: Secondary | ICD-10-CM

## 2016-09-14 LAB — BASIC METABOLIC PANEL
ANION GAP: 10 (ref 5–15)
BUN: 10 mg/dL (ref 6–20)
CALCIUM: 8.4 mg/dL — AB (ref 8.9–10.3)
CO2: 38 mmol/L — ABNORMAL HIGH (ref 22–32)
Chloride: 83 mmol/L — ABNORMAL LOW (ref 101–111)
Creatinine, Ser: 0.77 mg/dL (ref 0.44–1.00)
GFR calc Af Amer: >60 mL/min (ref >60)
Glucose, Bld: 88 mg/dL (ref 65–99)
Potassium: 3.5 mmol/L (ref 3.5–5.1)
SODIUM: 131 mmol/L — AB (ref 135–145)

## 2016-09-14 LAB — CBC
HCT: 38.5 % (ref 36.0–46.0)
Hemoglobin: 12.4 g/dL (ref 12.0–15.0)
MCH: 29 pg (ref 26.0–34.0)
MCHC: 32.2 g/dL (ref 30.0–36.0)
MCV: 90.2 fL (ref 78.0–100.0)
PLATELETS: 155 10*3/uL (ref 150–400)
RBC: 4.27 MIL/uL (ref 3.87–5.11)
RDW: 15 % (ref 11.5–15.5)
WBC: 9.5 10*3/uL (ref 4.0–10.5)

## 2016-09-14 MED ORDER — PREDNISONE 20 MG PO TABS
40.0000 mg | ORAL_TABLET | Freq: Every day | ORAL | Status: DC
  Administered 2016-09-14 – 2016-09-15 (×2): 40 mg via ORAL
  Filled 2016-09-14 (×2): qty 2

## 2016-09-14 MED ORDER — LEVOFLOXACIN 750 MG PO TABS: 750.0000 mg | ORAL_TABLET | Freq: Every day | ORAL | Status: DC

## 2016-09-14 NOTE — Progress Notes (Signed)
SATURATION QUALIFICATIONS: (This note is used to comply with regulatory documentation for home oxygen)  Patient Saturations on Room Air at Rest = 97%  Patient Saturations on Room Air while Ambulating = 87%  Patient Saturations on 2 Liters of oxygen while Ambulating = 94%  Please briefly explain why patient needs home oxygen: O2 saturations on room air was 87%.

## 2016-09-14 NOTE — Progress Notes (Signed)
[facility]                                                                              Patient Demographics  Samantha Dyer, is a 66 y.o. female, DOB - [birth year], ZOX:[id number]  Admit date - 09/12/2016   Admitting Physician Oretha Milch, MD  Outpatient Primary MD for the patient is FRIED, Doris Cheadle, MD  Outpatient specialists:   LOS - 2  days    Chief Complaint  Patient presents with  . Respiratory Distress    unresponsive       Brief summary   Patient is a 66 year old female with severe COPD, gold stage IV, current smoker, recently hospitalized for COPD exacerbation in November requiring intubation. Patient presented to ED via EMS for acute on chronic respiratory failure, became unresponsive and throat and was urgently intubated on arrival. Patient was admitted to ICU under critical care service. Once extubated, patient was transferred out of ICU, [facility] assumed care on 09/14/16   Assessment & Plan    Principal Problem:   Acute on chronic respiratory failure (HCC) - Patient was admitted with severe COPD exacerbation requiring intubation and mechanical ventilation - Continue O2 via nasal cannula, home O2 evaluation - Continue duo nebs, albuterol, Pulmicort, - Patient was tapered from IV Solu-Medrol, will start oral prednisone. Patient initially stated that she is allergic to prednisone which makes her "crazy". Subsequently she stated that in the past she was given "like 20 pills which made me crazy" but does not have any allergy to prednisone, wants to try prednisone 40 mg daily. I also discussed with pharmacy as well. Will dc on lower dose when ready.  - Also currently on IV Lasix per  CCM, will transition to home dose of oral Lasix tomorrow    Active Problems:   TOBACCO ABUSE - Patient counseled strongly on nicotine cessation - Continue nicotine patch    Esophageal reflux - Continue PPI    COPD exacerbation (HCC) - Continue Xopenex nebs, Pulmicort,  albuterol, prednisone, levofloxacin  Code Status: full  DVT Prophylaxis:  heparin Family Communication: Discussed in detail with the patient, all imaging results, lab results explained to the patient   Disposition Plan:  Likely DC home in a.m.  Time Spent in minutes  25 minutes  Procedures:  Intubation  Consultants:   CCM  Antimicrobials:      Medications  Scheduled Meds: . budesonide (PULMICORT) nebulizer solution  0.5 mg Nebulization BID  . furosemide  40 mg Intravenous BID  . heparin  5,000 Units Subcutaneous Q8H  . levalbuterol  0.63 mg Nebulization TID  . [START ON 09/15/2016] levofloxacin  750 mg Oral Daily  . nicotine  14 mg Transdermal Daily  . pantoprazole  40 mg Oral Daily  . predniSONE  40 mg Oral Q breakfast   Continuous Infusions: PRN Meds:.sodium chloride, albuterol, docusate, hydrALAZINE, ondansetron (ZOFRAN) IV   Antibiotics   Anti-infectives    Start     Dose/Rate Route Frequency Ordered Stop   09/15/16 1000  levofloxacin (LEVAQUIN) tablet 750 mg     750 mg Oral Daily 09/14/16 1020 09/19/16 0959   09/12/16 0500  levofloxacin (LEVAQUIN) IVPB 750 mg  Status:  Discontinued     750 mg 100 mL/hr over 90 Minutes Intravenous Every 24 hours 09/12/16 0422 09/14/16 1020        Subjective:   Samantha Dyer was seen and examined today. Still somewhat short of breath and pursed lip breathing. Patient denies dizziness, chest pain, abdominal pain, N/V/D/C, new weakness, numbess, tingling. No acute events overnight.    Objective:   Vitals:   09/13/16 2031 09/14/16 0500 09/14/16 0545 09/14/16 0739  BP: 138/70  97/66   Pulse: (!) 116  (!) 105   Resp: 18  17   Temp: 98 F (36.7 C)  98.3 F (36.8 C)   TempSrc: Oral  Oral   SpO2: 97%  97% 96%  Weight:  67.1 kg (147 lb 14.9 oz)    Height:        Intake/Output Summary (Last 24 hours) at 09/14/16 1134 Last data filed at 09/14/16 0939  Gross per 24 hour  Intake           747.17 ml  Output              3480 ml  Net         -2732.83 ml     Wt Readings from Last 3 Encounters:  09/14/16 67.1 kg (147 lb 14.9 oz)  07/13/16 70.1 kg (154 lb 9.6 oz)  07/09/16 70 kg (154 lb 5.2 oz)     Exam  General: Alert and oriented x 3, NAD  HEENT:    Neck: Supple, no JVD  Cardiovascular: S1 S2 auscultated, no rubs, murmurs or gallops. Regular rate and rhythm.  Respiratory:  Diminished breath sounds throughout  Gastrointestinal: Soft, nontender, nondistended, + bowel sounds  Ext: no cyanosis clubbing or edema  Neuro: AAOx3, Cr N's II- XII. Strength 5/5 upper and lower extremities bilaterally  Skin: No rashes  Psych: Normal affect and demeanor, alert and oriented x3    Data Reviewed:  I have personally reviewed following labs and imaging studies  Micro Results Recent Results (from the past 240 hour(s))  Urine culture     Status: None   Collection Time: 09/12/16  2:45 AM  Result Value Ref Range Status   Specimen Description URINE, CATHETERIZED  Final   Special Requests NONE  Final   Culture NO GROWTH  Final   Report Status 09/13/2016 FINAL  Final  Respiratory Panel by PCR     Status: None   Collection Time: 09/12/16  4:08 AM  Result Value Ref Range Status   Adenovirus NOT DETECTED NOT DETECTED Final   Coronavirus 229E NOT DETECTED NOT DETECTED Final   Coronavirus HKU1 NOT DETECTED NOT DETECTED Final   Coronavirus NL63 NOT DETECTED NOT DETECTED Final   Coronavirus OC43 NOT DETECTED NOT DETECTED Final   Metapneumovirus NOT DETECTED NOT DETECTED Final   Rhinovirus / Enterovirus NOT DETECTED NOT DETECTED Final   Influenza A NOT DETECTED NOT DETECTED Final   Influenza B NOT DETECTED NOT DETECTED Final   Parainfluenza Virus 1 NOT DETECTED NOT DETECTED Final   Parainfluenza Virus 2 NOT DETECTED NOT DETECTED Final   Parainfluenza Virus 3 NOT DETECTED NOT DETECTED Final   Parainfluenza Virus 4 NOT DETECTED NOT DETECTED Final   Respiratory Syncytial Virus NOT DETECTED NOT DETECTED Final    Bordetella pertussis NOT DETECTED NOT DETECTED Final   Chlamydophila pneumoniae NOT DETECTED NOT DETECTED Final   Mycoplasma pneumoniae NOT DETECTED NOT DETECTED Final  MRSA PCR Screening  Status: None   Collection Time: 09/12/16  4:08 AM  Result Value Ref Range Status   MRSA by PCR NEGATIVE NEGATIVE Final    Comment:        The GeneXpert MRSA Assay (FDA approved for NASAL specimens only), is one component of a comprehensive MRSA colonization surveillance program. It is not intended to diagnose MRSA infection nor to guide or monitor treatment for MRSA infections.   Culture, blood (routine x 2)     Status: None (Preliminary result)   Collection Time: 09/12/16  5:37 AM  Result Value Ref Range Status   Specimen Description BLOOD LEFT ANTECUBITAL  Final   Special Requests IN PEDIATRIC BOTTLE 1CC  Final   Culture NO GROWTH 1 DAY  Final   Report Status PENDING  Incomplete  Culture, blood (routine x 2)     Status: None (Preliminary result)   Collection Time: 09/12/16  5:37 AM  Result Value Ref Range Status   Specimen Description BLOOD BLOOD LEFT HAND  Final   Special Requests IN PEDIATRIC BOTTLE 1CC  Final   Culture NO GROWTH 1 DAY  Final   Report Status PENDING  Incomplete    Radiology Reports Dg Chest Port 1 View  Result Date: 09/13/2016 CLINICAL DATA:  Respiratory failure EXAM: PORTABLE CHEST 1 VIEW COMPARISON:  09/12/2016 FINDINGS: Borderline cardiomegaly. Endotracheal and NG tube has been removed. Central mild vascular congestion and mild perihilar interstitial prominence suspicious for mild interstitial edema. Small right pleural effusion with streaky right basilar atelectasis or infiltrate. There is no pneumothorax. IMPRESSION: Endotracheal and NG tube has been removed. Central mild vascular congestion and mild perihilar interstitial prominence suspicious for mild interstitial edema. Small right pleural effusion with streaky right basilar atelectasis or infiltrate. There is  no pneumothorax. Electronically Signed   By: Natasha MeadLiviu  Pop M.D.   On: 09/13/2016 08:13   Dg Chest Portable 1 View  Result Date: 09/12/2016 CLINICAL DATA:  [age] year old female status post intubation. EXAM: PORTABLE CHEST 1 VIEW COMPARISON:  Chest radiograph dated 07/11/2016 FINDINGS: Endotracheal tube the tip approximately 2.5 cm above the carina. Recommend retraction by 2 cm for optimal positioning. An enteric tube is partially visualized coursing into the left hemiabdomen with tip beyond the inferior margin of the image. There is diffuse interstitial prominence and interlobular septal thickening with Kerley B-lines suggestive of mild interstitial edema. There is no focal consolidation, pleural effusion, or pneumothorax. The cardiac silhouette is within normal limits. No acute osseous pathology. IMPRESSION: Endotracheal tube approximately 2.5 cm above the carina. Interlobular septal prominence likely represent a degree of interstitial edema. Clinical correlation is recommended. Electronically Signed   By: Elgie CollardArash  Radparvar M.D.   On: 09/12/2016 03:10    Lab Data:  CBC:  Recent Labs Lab 09/12/16 0245 09/13/16 0258 09/14/16 0642  WBC 14.9* 9.3 9.5  NEUTROABS 10.4*  --   --   HGB 13.9 12.2 12.4  HCT 43.3 37.3 38.5  MCV 91.9 89.9 90.2  PLT 318 159 155   Basic Metabolic Panel:  Recent Labs Lab 09/12/16 0245 09/12/16 0849 09/13/16 0258 09/14/16 0642  NA 127* 129* 131* 131*  K 4.6 4.6 4.0 3.5  CL 82* 87* 83* 83*  CO2 35* 29 39* 38*  GLUCOSE 303* 126* 128* 88  BUN 5* 7 8 10   CREATININE 0.92 0.85 0.85 0.77  CALCIUM 8.9 8.8* 8.6* 8.4*  MG  --   --  1.8  --   PHOS  --   --  5.0*  --  GFR: Estimated Creatinine Clearance: 68.2 mL/min (by C-G formula based on SCr of 0.77 mg/dL). Liver Function Tests:  Recent Labs Lab 09/12/16 0245  AST 26  ALT 12*  ALKPHOS 81  BILITOT 0.6  PROT 5.9*  ALBUMIN 3.5   No results for input(s): LIPASE, AMYLASE in the last 168 hours. No results for  input(s): AMMONIA in the last 168 hours. Coagulation Profile: No results for input(s): INR, PROTIME in the last 168 hours. Cardiac Enzymes: No results for input(s): CKTOTAL, CKMB, CKMBINDEX, TROPONINI in the last 168 hours. BNP (last 3 results) No results for input(s): PROBNP in the last 8760 hours. HbA1C: No results for input(s): HGBA1C in the last 72 hours. CBG:  Recent Labs Lab 09/13/16 0040 09/13/16 0356 09/13/16 0827 09/13/16 1121 09/13/16 1528  GLUCAP 114* 119* 88 100* 140*   Lipid Profile:  Recent Labs  09/12/16 0245  TRIG 93   Thyroid Function Tests: No results for input(s): TSH, T4TOTAL, FREET4, T3FREE, THYROIDAB in the last 72 hours. Anemia Panel: No results for input(s): VITAMINB12, FOLATE, FERRITIN, TIBC, IRON, RETICCTPCT in the last 72 hours. Urine analysis:    Component Value Date/Time   COLORURINE YELLOW 09/12/2016 0350   APPEARANCEUR CLOUDY (A) 09/12/2016 0350   LABSPEC 1.015 09/12/2016 0350   PHURINE 6.0 09/12/2016 0350   GLUCOSEU >=500 (A) 09/12/2016 0350   HGBUR MODERATE (A) 09/12/2016 0350   BILIRUBINUR NEGATIVE 09/12/2016 0350   KETONESUR NEGATIVE 09/12/2016 0350   PROTEINUR 100 (A) 09/12/2016 0350   NITRITE NEGATIVE 09/12/2016 0350   LEUKOCYTESUR NEGATIVE 09/12/2016 0350     RAI,RIPUDEEP M.D. [facility] 09/14/2016, 11:34 AM  Pager: [phone] Between 7am to 7pm - call Pager - [phone]  After 7pm go to [email or web address] - password [email or web address]  Call night coverage person covering after 7pm

## 2016-09-15 MED ORDER — PREDNISONE 10 MG PO TABS: ORAL_TABLET | ORAL | 0 refills | Status: DC

## 2016-09-15 MED ORDER — LEVOFLOXACIN 750 MG PO TABS: 750.0000 mg | ORAL_TABLET | Freq: Every day | ORAL | 0 refills | Status: DC

## 2016-09-15 MED ORDER — PANTOPRAZOLE SODIUM 40 MG PO TBEC: 40.0000 mg | DELAYED_RELEASE_TABLET | Freq: Every day | ORAL | 0 refills | Status: DC

## 2016-09-15 MED ORDER — FUROSEMIDE 40 MG PO TABS: 40.0000 mg | ORAL_TABLET | Freq: Every day | ORAL | 4 refills | Status: DC

## 2016-09-15 NOTE — Discharge Planning (Signed)
Patient discharged home in stable condition. Verbalizes understanding of all discharge instructions, including home medications and follow up appointments. 

## 2016-09-15 NOTE — Care Management Note (Signed)
Case Management Note  Patient Details  Name: Samantha Dyer MRN: [id number] Date of Birth: [birth year]  Subjective/Objective:                    Action/Plan:  Patient already has home oxygen . Patient states she does not need portable tank. Offered to arrange to have one brought to her room , patient declined .  Patient retired in December 2018 [facility] to [facility] . Explained patient can call number on insurance card and notify . Patient voiced understanding and stated she will call.  Expected Discharge Date:  09/15/16               Expected Discharge Plan:  Home/Self Care  In-House Referral:     Discharge planning Services  CM Consult  Post Acute Care Choice:    Choice offered to:  Patient  DME Arranged:    DME Agency:     HH Arranged:    HH Agency:     Status of Service:  Completed, signed off  If discussed at [employer] Length of Stay Meetings, dates discussed:    Additional Comments:  Kingsley PlanWile, [place] Albea Marie, RN 09/15/2016, 9:03 AM

## 2016-09-15 NOTE — Discharge Summary (Addendum)
Physician Discharge Summary   Patient ID: Samantha Dyer MRN: [id number] DOB/AGE: [birth year] 66 y.o.  Admit date: 09/12/2016 Discharge date: 09/15/2016  Primary Care Physician:  Lenora Boys, MD  Discharge Diagnoses:    . Acute on chronic respiratory failure (HCC)Requiring mechanical ventilation  . COPD exacerbation (HCC) . Esophageal reflux . TOBACCO ABUSE   Consults:   Patient was admitted by critical care service  Recommendations for Outpatient Follow-up:  1. Please repeat CBC/BMET at next visit   DIET: Heart healthy diet    Allergies:   Allergies  Allergen Reactions  . Amoxicillin-Pot Clavulanate Swelling  . Prednisone Other (See Comments)    "drives her crazy". Made her psychotic     DISCHARGE MEDICATIONS: Discharge Medication List as of 09/15/2016  9:43 AM    START taking these medications   Details  levofloxacin (LEVAQUIN) 750 MG tablet Take 1 tablet (750 mg total) by mouth daily. X 4 days, Starting Fri 09/15/2016, Print    pantoprazole (PROTONIX) 40 MG tablet Take 1 tablet (40 mg total) by mouth daily. Can substitute with any generic PPI while taking steroids, Starting Fri 09/15/2016, Print      CONTINUE these medications which have CHANGED   Details  furosemide (LASIX) 40 MG tablet Take 1 tablet (40 mg total) by mouth daily., Starting Fri 09/15/2016, Print    predniSONE (DELTASONE) 10 MG tablet Prednisone dosing: Take  Prednisone 40mg  (4 tabs) x 3 days, then taper to 30mg  (3 tabs) x 3 days, then 20mg  (2 tabs) x 3days, then 10mg  (1 tab) x 3days, then OFF., Print      CONTINUE these medications which have NOT CHANGED   Details  COMBIVENT RESPIMAT 20-100 MCG/ACT AERS respimat Inhale 1 puff into the lungs 4 (four) times daily. , Starting Fri 06/09/2016, Historical Med    econazole nitrate 1 % cream Apply 1 application topically 2 (two) times daily., Historical Med    fluticasone furoate-vilanterol (BREO ELLIPTA) 200-25 MCG/INH AEPB Inhale 1 puff into  the lungs daily., Starting Thu 07/27/2016, Sample    guaiFENesin (MUCINEX) 600 MG 12 hr tablet Take 1 tablet (600 mg total) by mouth 2 (two) times daily as needed for to loosen phlegm., Starting Sun 07/09/2016, Normal    ibuprofen (ADVIL,MOTRIN) 200 MG tablet Take 200 mg by mouth every 6 (six) hours as needed for moderate pain. , Historical Med    OXYGEN Inhale into the lungs continuous. 2 L, Historical Med    tiotropium (SPIRIVA HANDIHALER) 18 MCG inhalation capsule inhale contents of one capsule once daily, Historical Med      STOP taking these medications     azithromycin (ZITHROMAX) 250 MG tablet      sodium chloride 1 g tablet          Brief H and P: For complete details please refer to admission H and P, but in brief Patient is a 66 year old female with severe COPD, gold stage IV, current smoker, recently hospitalized for COPD exacerbation in November requiring intubation. Patient presented to ED via EMS for acute on chronic respiratory failure, became unresponsive and throat and was urgently intubated on arrival. Patient was admitted to ICU under critical care service. Once extubated, patient was transferred out of ICU, [facility] assumed care on 09/14/16   Hospital Course:  Acute on chronic respiratory failure (HCC) with hypoxia secondary to severe COPD exacerbation - Patient was admitted with severe COPD exacerbation requiring intubation and mechanical ventilation. Patient was extubated and has done well on O2 via  nasal cannula. - Now resolved, O2 sats 99% on 2 L O2 via nasal cannula.  - She was placed on scheduled duo nebs, albuterol as needed, Pulmicort, - Patient was tapered from IV Solu-Medrol, started on oral prednisone which she tolerated well.  - She was also placed on IV Lasix by CCM, transitioned to home dose of oral Lasix     TOBACCO ABUSE - Patient counseled strongly on nicotine cessation - Patient was placed on nicotine patch while inpatient    Esophageal reflux -  Continue PPI    COPD exacerbation (HCC) -Currently improved, patient was discharged on prednisone with taper, oral levofloxacin. She was recommended to continue with combivent, spiriva and breo   Day of Discharge BP (!) 140/57   Pulse 97   Temp 98.5 F (36.9 C)   Resp 18   Ht 5\' 7"  (1.702 m)   Wt 68 kg (149 lb 14.6 oz)   SpO2 99%   BMI 23.48 kg/m   Physical Exam: General: Alert and awake oriented x3 not in any acute distress. HEENT: anicteric sclera, pupils reactive to light and accommodation CVS: S1-S2 clear no murmur rubs or gallops Chest: clear to auscultation bilaterally, no wheezing rales or rhonchi Abdomen: soft nontender, nondistended, normal bowel sounds Extremities: no cyanosis, clubbing or edema noted bilaterally Neuro: Cranial nerves II-XII intact, no focal neurological deficits   The results of significant diagnostics from this hospitalization (including imaging, microbiology, ancillary and laboratory) are listed below for reference.    LAB RESULTS: Basic Metabolic Panel:  Recent Labs Lab 09/13/16 0258 09/14/16 0642  NA 131* 131*  K 4.0 3.5  CL 83* 83*  CO2 39* 38*  GLUCOSE 128* 88  BUN 8 10  CREATININE 0.85 0.77  CALCIUM 8.6* 8.4*  MG 1.8  --   PHOS 5.0*  --    Liver Function Tests:  Recent Labs Lab 09/12/16 0245  AST 26  ALT 12*  ALKPHOS 81  BILITOT 0.6  PROT 5.9*  ALBUMIN 3.5   No results for input(s): LIPASE, AMYLASE in the last 168 hours. No results for input(s): AMMONIA in the last 168 hours. CBC:  Recent Labs Lab 09/12/16 0245 09/13/16 0258 09/14/16 0642  WBC 14.9* 9.3 9.5  NEUTROABS 10.4*  --   --   HGB 13.9 12.2 12.4  HCT 43.3 37.3 38.5  MCV 91.9 89.9 90.2  PLT 318 159 155   Cardiac Enzymes: No results for input(s): CKTOTAL, CKMB, CKMBINDEX, TROPONINI in the last 168 hours. BNP: Invalid input(s): POCBNP CBG:  Recent Labs Lab 09/13/16 1121 09/13/16 1528  GLUCAP 100* 140*    Significant Diagnostic Studies:   Dg Chest Port 1 View  Result Date: 09/13/2016 CLINICAL DATA:  Respiratory failure EXAM: PORTABLE CHEST 1 VIEW COMPARISON:  09/12/2016 FINDINGS: Borderline cardiomegaly. Endotracheal and NG tube has been removed. Central mild vascular congestion and mild perihilar interstitial prominence suspicious for mild interstitial edema. Small right pleural effusion with streaky right basilar atelectasis or infiltrate. There is no pneumothorax. IMPRESSION: Endotracheal and NG tube has been removed. Central mild vascular congestion and mild perihilar interstitial prominence suspicious for mild interstitial edema. Small right pleural effusion with streaky right basilar atelectasis or infiltrate. There is no pneumothorax. Electronically Signed   By: Natasha MeadLiviu  Pop M.D.   On: 09/13/2016 08:13   Dg Chest Portable 1 View  Result Date: 09/12/2016 CLINICAL DATA:  66 year old female status post intubation. EXAM: PORTABLE CHEST 1 VIEW COMPARISON:  Chest radiograph dated 07/11/2016 FINDINGS: Endotracheal tube the tip  approximately 2.5 cm above the carina. Recommend retraction by 2 cm for optimal positioning. An enteric tube is partially visualized coursing into the left hemiabdomen with tip beyond the inferior margin of the image. There is diffuse interstitial prominence and interlobular septal thickening with Kerley B-lines suggestive of mild interstitial edema. There is no focal consolidation, pleural effusion, or pneumothorax. The cardiac silhouette is within normal limits. No acute osseous pathology. IMPRESSION: Endotracheal tube approximately 2.5 cm above the carina. Interlobular septal prominence likely represent a degree of interstitial edema. Clinical correlation is recommended. Electronically Signed   By: Elgie Collard M.D.   On: 09/12/2016 03:10    2D ECHO:   Disposition and Follow-up: Discharge Instructions    Diet general    Complete by:  As directed    Discharge instructions    Complete by:  As directed     Please continue Albuterol inhaler three times a day for 3 days, then continue per your home schedule. Continue Breo and spiriva per your schedule.   Increase activity slowly    Complete by:  As directed        DISPOSITION: home     DISCHARGE FOLLOW-UP Follow-up Information    Rubye Oaks, NP Follow up on 09/21/2016.   Specialty:  Pulmonary Disease Why:  at 11:45AM for hospital follow-up  Contact information: [address] [place] [address] [phone]        Lenora Boys, MD. Schedule an appointment as soon as possible for a visit in 2 week(s).   Specialty:  Family Medicine Contact information: [address] [place] [address] [phone]            Time spent on Discharge:   Signed:   Casidy Alberta M.D. [facility] 09/15/2016, 11:18 AM Pager: [phone]   Coding query:  Acute metabolic encephalopathy secondary to acute on chronic hypercarbic respiratory failure based on ABG results. Resolved at the time of discharge     Lynn Sissel M.D. [facility] 09/22/2016, 4:26 PM  Pager: [phone]

## 2016-09-17 LAB — CULTURE, BLOOD (ROUTINE X 2)
Culture: NO GROWTH
Culture: NO GROWTH

## 2016-09-21 ENCOUNTER — Ambulatory Visit (INDEPENDENT_AMBULATORY_CARE_PROVIDER_SITE_OTHER): Payer: Medicare Other | Admitting: Adult Health

## 2016-09-21 ENCOUNTER — Encounter: Payer: Self-pay | Admitting: Adult Health

## 2016-09-21 DIAGNOSIS — F17200 Nicotine dependence, unspecified, uncomplicated: Secondary | ICD-10-CM

## 2016-09-21 DIAGNOSIS — J9611 Chronic respiratory failure with hypoxia: Secondary | ICD-10-CM

## 2016-09-21 DIAGNOSIS — J441 Chronic obstructive pulmonary disease with (acute) exacerbation: Secondary | ICD-10-CM

## 2016-09-21 MED ORDER — FLUTICASONE FUROATE-VILANTEROL 200-25 MCG/INH IN AEPB: 1.0000 | INHALATION_SPRAY | Freq: Every day | RESPIRATORY_TRACT | 0 refills | Status: AC

## 2016-09-21 MED ORDER — ROFLUMILAST 500 MCG PO TABS: 500.0000 ug | ORAL_TABLET | Freq: Every day | ORAL | 11 refills | Status: DC

## 2016-09-21 MED ORDER — TIOTROPIUM BROMIDE MONOHYDRATE 2.5 MCG/ACT IN AERS: 2.0000 | INHALATION_SPRAY | Freq: Every day | RESPIRATORY_TRACT | 0 refills | Status: DC

## 2016-09-21 NOTE — Patient Instructions (Addendum)
Taper off prednisone as directed Continue on BREO and Spiriva  Begin Daliresp daily -take with food and full glass of water.  Continue on oxygen 2l/m  MUST STOP SMOKING   Follow up Dr. Vassie [name]  6-8 weeks and As needed   Please contact office for sooner follow up if symptoms do not improve or worsen or seek emergency care

## 2016-09-21 NOTE — Assessment & Plan Note (Signed)
Smoking cessation  

## 2016-09-21 NOTE — Assessment & Plan Note (Signed)
Continue on o2 2l/m w/ act and At bedtime  .

## 2016-09-21 NOTE — Assessment & Plan Note (Signed)
Recurrent exacerbation in active smoker Long discussion with her and her husband regarding her dz severity and smoking along with frequent flare with hospital admission and vent support. Discussed smoking cessation , nicotine patches, medications, classes. She does not seem to have a understanding of the severity of her illness .  Will try Daliresp to see if this helps . Can look at possible noninvasive ventilation At bedtime  (she has significant hypercarbia leading to ventilator in past) .   Plan  Patient Instructions  Taper off prednisone as directed Continue on BREO and Spiriva  Begin Daliresp daily -take with food and full glass of water.  Continue on oxygen 2l/m  MUST STOP SMOKING   Follow up Dr. Vassie [name]  6-8 weeks and As needed   Please contact office for sooner follow up if symptoms do not improve or worsen or seek emergency care

## 2016-09-21 NOTE — Addendum Note (Signed)
Addended by: Velvet BatheAULFIELD, Aleyda Gindlesperger L on: 09/21/2016 11:44 AM   Modules accepted: Orders

## 2016-09-21 NOTE — Progress Notes (Signed)
 @Patient  ID: Samantha Dyer, female    DOB: 05/02/1951, 66 y.o.   MRN: [id number]  Chief Complaint  Patient presents with  . Follow-up    COPD /post hospital     Referring provider: Marinda ElkFried, Robert, MD  HPI: 66/F smoker for FU of gold stg 4 COPD, CO2 retainer .  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 Discharged on oxygen  09/21/2016 Follow up ; COPD /Post hospital follow up  Pt returns for a post hospital follow up . She had COPD exacerbation with acute on chronic RF , requiring intubation. PCO2 was 117.  Tx w/ IV abx, steroids, nebs and diuresis. She continues to smoke, cessation discussed. Long discussion about smoking , she says she was smoking on way home from hospital. Denies anxiety or depression. She retired this month.  She has been admitted 3 times over last 2-3 months.  Says she is taking Virgel BouquetBREO /Spiriva , no missed doses .  Wear O2 2l/m with walking and At bedtime  . Could not tolreate 3l/m .  She says she is feeling better. Has few days left on prednisone taper.  Occasional cough and wheezing . Gets winded with walking.  Feels weak w/ low energy.      Significant tests/ events 2011Spirometry severe airway obstruction with FEV1 of 30% c/w Gold stg IV copd.  TTE 11/18: LV normal in size with EF 55-60%.Inadequate for wall motion assessment. Grade 1 diastolic dysfunction with elevated LV filling pressure. LA mildly dilated &RA normal in size. RV poorly visualized. No aortic regurgitation or stenosis was poorly visualized valve. Aortic root normal in size. Trivial mitral regurgitation. No significant pulmonic regurgitation. No significant tricuspid regurgitation. Pulmonary artery poorly visualized. No pericardial effusion.  with exertion or at rest.  No excess mucus, no productive cough,  No non-productive cough,  No coughing up of blood.  No change in color of mucus.  No wheezing.  No chest wall  deformity    Allergies  Allergen Reactions  . Amoxicillin-Pot Clavulanate Swelling    Immunization History  Administered Date(s) Administered  . Influenza Split 05/21/2013, 05/05/2014, 06/04/2016  . Influenza Whole 06/21/2010  . Pneumococcal Polysaccharide-23 07/04/2010    Past Medical History:  Diagnosis Date  . Allergic rhinitis, cause unspecified   . Anemia, unspecified   . Backache, unspecified   . COPD (chronic obstructive pulmonary disease) (HCC)   . Esophageal reflux   . Iron deficiency anemia, unspecified     Tobacco History: History  Smoking Status  . Current Every Day Smoker  . Packs/day: 1.00  . Years: 43.00  . Types: Cigarettes  Smokeless Tobacco  . Never Used   Ready to quit: No Counseling given: Yes   Outpatient Encounter Prescriptions as of 09/21/2016  Medication Sig  . acetaminophen (TYLENOL) 325 MG tablet Take 650 mg by mouth every 6 (six) hours as needed.  . COMBIVENT RESPIMAT 20-100 MCG/ACT AERS respimat Inhale 1 puff into the lungs 4 (four) times daily.   Marland Kitchen. econazole nitrate 1 % cream Apply 1 application topically 2 (two) times daily.  Marland Kitchen. esomeprazole (NEXIUM) 40 MG capsule Take 40 mg by mouth daily at 12 noon.  . fluticasone furoate-vilanterol (BREO ELLIPTA) 200-25 MCG/INH AEPB Inhale 1 puff into the lungs daily.  . furosemide (LASIX) 40 MG tablet Take 1 tablet (40 mg total) by mouth daily.  Marland Kitchen. guaiFENesin (MUCINEX) 600 MG 12 hr tablet Take 1 tablet (600 mg total) by  mouth 2 (two) times daily as needed for to loosen phlegm.  . OXYGEN Inhale into the lungs continuous. 2 L  . predniSONE (DELTASONE) 10 MG tablet Prednisone dosing: Take  Prednisone 40mg  (4 tabs) x 3 days, then taper to 30mg  (3 tabs) x 3 days, then 20mg  (2 tabs) x 3days, then 10mg  (1 tab) x 3days, then OFF.  Marland Kitchen tiotropium (SPIRIVA HANDIHALER) 18 MCG inhalation capsule inhale contents of one capsule once daily  . roflumilast (DALIRESP) 500 MCG TABS tablet Take 1 tablet (500 mcg total) by  mouth daily.  . [DISCONTINUED] ibuprofen (ADVIL,MOTRIN) 200 MG tablet Take 200 mg by mouth every 6 (six) hours as needed for moderate pain.   . [DISCONTINUED] levofloxacin (LEVAQUIN) 750 MG tablet Take 1 tablet (750 mg total) by mouth daily. X 4 days (Patient not taking: Reported on 09/21/2016)  . [DISCONTINUED] pantoprazole (PROTONIX) 40 MG tablet Take 1 tablet (40 mg total) by mouth daily. Can substitute with any generic PPI while taking steroids (Patient not taking: Reported on 09/21/2016)   No facility-administered encounter medications on file as of 09/21/2016.      Review of Systems  Constitutional:   No  weight loss, night sweats,  Fevers, chills, + fatigue, or  lassitude.  HEENT:   No headaches,  Difficulty swallowing,  Tooth/dental problems, or  Sore throat,                No sneezing, itching, ear ache,  +nasal congestion, post nasal drip,   CV:  No chest pain,  Orthopnea, PND, swelling in lower extremities, anasarca, dizziness, palpitations, syncope.   GI  No heartburn, indigestion, abdominal pain, nausea, vomiting, diarrhea, change in bowel habits, loss of appetite, bloody stools.   Resp:    No wheezing.  No chest wall deformity  Skin: no rash or lesions.  GU: no dysuria, change in color of urine, no urgency or frequency.  No flank pain, no hematuria   MS:  No joint pain or swelling.  No decreased range of motion.  No back pain.    Physical Exam  BP (!) 146/69 (BP Location: Right Arm, Cuff Size: Normal)   Pulse 88   Ht 5' 3.5" (1.613 m)   Wt 145 lb (65.8 kg)   SpO2 91%   BMI 25.28 kg/m   GEN: A/Ox3; pleasant , NAD, elderly    HEENT:  [place]/AT,  EACs-clear, TMs-wnl, NOSE-clear, THROAT-clear, no lesions, no postnasal drip or exudate noted.   NECK:  Supple w/ fair ROM; no JVD; normal carotid impulses w/o bruits; no thyromegaly or nodules palpated; no lymphadenopathy.    RESP  Decreased BS in bass , . no accessory muscle use, no dullness to percussion  CARD:  RRR, no  m/r/g, no peripheral edema, pulses intact, no cyanosis or clubbing.  GI:   Soft & nt; nml bowel sounds; no organomegaly or masses detected.   Musco: Warm bil, no deformities or joint swelling noted.   Neuro: alert, no focal deficits noted.    Skin: Warm, no lesions or rashes  Psych:  No change in mood or affect. No depression or anxiety.  No memory loss.  Lab Results:  CBC    Component Value Date/Time   WBC 9.5 09/14/2016 0642   RBC 4.27 09/14/2016 0642   HGB 12.4 09/14/2016 0642   HCT 38.5 09/14/2016 0642   PLT 155 09/14/2016 0642   MCV 90.2 09/14/2016 0642   MCH 29.0 09/14/2016 0642   MCHC 32.2 09/14/2016 6962  RDW 15.0 09/14/2016 0642   LYMPHSABS 3.5 09/12/2016 0245   MONOABS 1.0 09/12/2016 0245   EOSABS 0.0 09/12/2016 0245   BASOSABS 0.0 09/12/2016 0245    BMET    Component Value Date/Time   NA 131 (L) 09/14/2016 0642   K 3.5 09/14/2016 0642   CL 83 (L) 09/14/2016 0642   CO2 38 (H) 09/14/2016 0642   GLUCOSE 88 09/14/2016 0642   BUN 10 09/14/2016 0642   CREATININE 0.77 09/14/2016 0642   CALCIUM 8.4 (L) 09/14/2016 0642   GFRNONAA >60 09/14/2016 0642   GFRAA >60 09/14/2016 0642    BNP    Component Value Date/Time   BNP 807.4 (H) 09/12/2016 0245    ProBNP    Component Value Date/Time   PROBNP 294.0 (H) 07/11/2010 2354    Imaging: Dg Chest Port 1 View  Result Date: 09/13/2016 CLINICAL DATA:  Respiratory failure EXAM: PORTABLE CHEST 1 VIEW COMPARISON:  09/12/2016 FINDINGS: Borderline cardiomegaly. Endotracheal and NG tube has been removed. Central mild vascular congestion and mild perihilar interstitial prominence suspicious for mild interstitial edema. Small right pleural effusion with streaky right basilar atelectasis or infiltrate. There is no pneumothorax. IMPRESSION: Endotracheal and NG tube has been removed. Central mild vascular congestion and mild perihilar interstitial prominence suspicious for mild interstitial edema. Small right pleural effusion  with streaky right basilar atelectasis or infiltrate. There is no pneumothorax. Electronically Signed   By: Natasha Mead M.D.   On: 09/13/2016 08:13   Dg Chest Portable 1 View  Result Date: 09/12/2016 CLINICAL DATA:  67 year old female status post intubation. EXAM: PORTABLE CHEST 1 VIEW COMPARISON:  Chest radiograph dated 07/11/2016 FINDINGS: Endotracheal tube the tip approximately 2.5 cm above the carina. Recommend retraction by 2 cm for optimal positioning. An enteric tube is partially visualized coursing into the left hemiabdomen with tip beyond the inferior margin of the image. There is diffuse interstitial prominence and interlobular septal thickening with Kerley B-lines suggestive of mild interstitial edema. There is no focal consolidation, pleural effusion, or pneumothorax. The cardiac silhouette is within normal limits. No acute osseous pathology. IMPRESSION: Endotracheal tube approximately 2.5 cm above the carina. Interlobular septal prominence likely represent a degree of interstitial edema. Clinical correlation is recommended. Electronically Signed   By: Elgie Collard M.D.   On: 09/12/2016 03:10     Assessment & Plan:   COPD exacerbation (HCC) Recurrent exacerbation in active smoker Long discussion with her and her husband regarding her dz severity and smoking along with frequent flare with hospital admission and vent support. Discussed smoking cessation , nicotine patches, medications, classes. She does not seem to have a understanding of the severity of her illness .  Will try Daliresp to see if this helps . Can look at possible noninvasive ventilation At bedtime  (she has significant hypercarbia leading to ventilator in past) .   Plan  Patient Instructions  Taper off prednisone as directed Continue on BREO and Spiriva  Begin Daliresp daily -take with food and full glass of water.  Continue on oxygen 2l/m  MUST STOP SMOKING   Follow up Dr. Vassie Loll  6-8 weeks and As needed   Please  contact office for sooner follow up if symptoms do not improve or worsen or seek emergency care      Chronic respiratory failure (HCC) Continue on o2 2l/m w/ act and At bedtime  .    TOBACCO ABUSE Smoking cessation      Rubye Oaks, NP 09/21/2016

## 2016-09-22 NOTE — Progress Notes (Signed)
Reviewed & agree with plan  

## 2016-09-25 DIAGNOSIS — F17210 Nicotine dependence, cigarettes, uncomplicated: Secondary | ICD-10-CM | POA: Diagnosis not present

## 2016-09-25 DIAGNOSIS — K219 Gastro-esophageal reflux disease without esophagitis: Secondary | ICD-10-CM | POA: Diagnosis not present

## 2016-09-25 DIAGNOSIS — J449 Chronic obstructive pulmonary disease, unspecified: Secondary | ICD-10-CM | POA: Diagnosis not present

## 2016-09-25 DIAGNOSIS — R600 Localized edema: Secondary | ICD-10-CM | POA: Diagnosis not present

## 2016-09-25 DIAGNOSIS — J9691 Respiratory failure, unspecified with hypoxia: Secondary | ICD-10-CM | POA: Diagnosis not present

## 2016-09-26 ENCOUNTER — Telehealth: Payer: Self-pay | Admitting: *Deleted

## 2016-09-26 NOTE — Telephone Encounter (Signed)
PA form has been completed and faxed back to the plan for determination of coverage.  Placed the PA back in the blue accordian folder in triage to follow up on.

## 2016-10-02 NOTE — Telephone Encounter (Signed)
Called [employer] to check status of Daliresp PA. This has been approved through 08/20/2017. [id number] Pharmacy aware of approval.  Nothing further needed.

## 2016-11-15 DIAGNOSIS — L408 Other psoriasis: Secondary | ICD-10-CM | POA: Diagnosis not present

## 2017-01-23 DIAGNOSIS — Z23 Encounter for immunization: Secondary | ICD-10-CM | POA: Diagnosis not present

## 2017-01-23 DIAGNOSIS — D509 Iron deficiency anemia, unspecified: Secondary | ICD-10-CM | POA: Diagnosis not present

## 2017-01-23 DIAGNOSIS — J449 Chronic obstructive pulmonary disease, unspecified: Secondary | ICD-10-CM | POA: Diagnosis not present

## 2017-01-23 DIAGNOSIS — K219 Gastro-esophageal reflux disease without esophagitis: Secondary | ICD-10-CM | POA: Diagnosis not present

## 2017-01-23 DIAGNOSIS — Z0000 Encounter for general adult medical examination without abnormal findings: Secondary | ICD-10-CM | POA: Diagnosis not present

## 2017-01-23 DIAGNOSIS — F17210 Nicotine dependence, cigarettes, uncomplicated: Secondary | ICD-10-CM | POA: Diagnosis not present

## 2017-03-06 DIAGNOSIS — J9691 Respiratory failure, unspecified with hypoxia: Secondary | ICD-10-CM | POA: Diagnosis not present

## 2017-03-06 DIAGNOSIS — J441 Chronic obstructive pulmonary disease with (acute) exacerbation: Secondary | ICD-10-CM | POA: Diagnosis not present

## 2017-05-28 ENCOUNTER — Inpatient Hospital Stay (HOSPITAL_COMMUNITY)
Admission: EM | Admit: 2017-05-28 | Discharge: 2017-06-14 | DRG: 233 | Disposition: A | Discharge status: Home Health | Payer: Medicare Other | Attending: Thoracic Surgery (Cardiothoracic Vascular Surgery) | Admitting: Thoracic Surgery (Cardiothoracic Vascular Surgery)

## 2017-05-28 DIAGNOSIS — Z888 Allergy status to other drugs, medicaments and biological substances status: Secondary | ICD-10-CM

## 2017-05-28 DIAGNOSIS — I252 Old myocardial infarction: Secondary | ICD-10-CM

## 2017-05-28 DIAGNOSIS — R069 Unspecified abnormalities of breathing: Secondary | ICD-10-CM | POA: Diagnosis not present

## 2017-05-28 DIAGNOSIS — D62 Acute posthemorrhagic anemia: Secondary | ICD-10-CM | POA: Diagnosis not present

## 2017-05-28 DIAGNOSIS — J441 Chronic obstructive pulmonary disease with (acute) exacerbation: Secondary | ICD-10-CM | POA: Diagnosis not present

## 2017-05-28 DIAGNOSIS — E785 Hyperlipidemia, unspecified: Secondary | ICD-10-CM | POA: Diagnosis present

## 2017-05-28 DIAGNOSIS — I5031 Acute diastolic (congestive) heart failure: Secondary | ICD-10-CM

## 2017-05-28 DIAGNOSIS — J9601 Acute respiratory failure with hypoxia: Secondary | ICD-10-CM | POA: Diagnosis not present

## 2017-05-28 DIAGNOSIS — F17210 Nicotine dependence, cigarettes, uncomplicated: Secondary | ICD-10-CM | POA: Diagnosis present

## 2017-05-28 DIAGNOSIS — R739 Hyperglycemia, unspecified: Secondary | ICD-10-CM | POA: Diagnosis present

## 2017-05-28 DIAGNOSIS — J9622 Acute and chronic respiratory failure with hypercapnia: Secondary | ICD-10-CM | POA: Diagnosis present

## 2017-05-28 DIAGNOSIS — D696 Thrombocytopenia, unspecified: Secondary | ICD-10-CM | POA: Diagnosis not present

## 2017-05-28 DIAGNOSIS — J9602 Acute respiratory failure with hypercapnia: Secondary | ICD-10-CM | POA: Diagnosis not present

## 2017-05-28 DIAGNOSIS — E781 Pure hyperglyceridemia: Secondary | ICD-10-CM | POA: Diagnosis present

## 2017-05-28 DIAGNOSIS — I214 Non-ST elevation (NSTEMI) myocardial infarction: Secondary | ICD-10-CM

## 2017-05-28 DIAGNOSIS — E876 Hypokalemia: Secondary | ICD-10-CM | POA: Diagnosis present

## 2017-05-28 DIAGNOSIS — I509 Heart failure, unspecified: Secondary | ICD-10-CM

## 2017-05-28 DIAGNOSIS — I255 Ischemic cardiomyopathy: Secondary | ICD-10-CM | POA: Diagnosis present

## 2017-05-28 DIAGNOSIS — I5043 Acute on chronic combined systolic (congestive) and diastolic (congestive) heart failure: Secondary | ICD-10-CM | POA: Diagnosis present

## 2017-05-28 DIAGNOSIS — J9621 Acute and chronic respiratory failure with hypoxia: Secondary | ICD-10-CM | POA: Diagnosis present

## 2017-05-28 DIAGNOSIS — K5900 Constipation, unspecified: Secondary | ICD-10-CM | POA: Diagnosis not present

## 2017-05-28 DIAGNOSIS — G9341 Metabolic encephalopathy: Secondary | ICD-10-CM | POA: Diagnosis present

## 2017-05-28 DIAGNOSIS — Z951 Presence of aortocoronary bypass graft: Secondary | ICD-10-CM

## 2017-05-28 DIAGNOSIS — J9811 Atelectasis: Secondary | ICD-10-CM

## 2017-05-28 DIAGNOSIS — I25118 Atherosclerotic heart disease of native coronary artery with other forms of angina pectoris: Secondary | ICD-10-CM | POA: Diagnosis present

## 2017-05-28 DIAGNOSIS — E871 Hypo-osmolality and hyponatremia: Secondary | ICD-10-CM | POA: Diagnosis present

## 2017-05-28 DIAGNOSIS — Z9981 Dependence on supplemental oxygen: Secondary | ICD-10-CM

## 2017-05-28 DIAGNOSIS — K219 Gastro-esophageal reflux disease without esophagitis: Secondary | ICD-10-CM | POA: Diagnosis present

## 2017-05-28 DIAGNOSIS — I5020 Unspecified systolic (congestive) heart failure: Secondary | ICD-10-CM

## 2017-05-28 MED ORDER — SUCCINYLCHOLINE CHLORIDE 20 MG/ML IJ SOLN
INTRAMUSCULAR | Status: AC | PRN
  Administered 2017-05-28: 100 mg via INTRAVENOUS

## 2017-05-28 MED ORDER — ETOMIDATE 2 MG/ML IV SOLN
INTRAVENOUS | Status: AC | PRN
  Administered 2017-05-28: 20 mg via INTRAVENOUS

## 2017-05-29 ENCOUNTER — Encounter (HOSPITAL_COMMUNITY): Payer: Self-pay | Admitting: Emergency Medicine

## 2017-05-29 ENCOUNTER — Inpatient Hospital Stay (HOSPITAL_COMMUNITY): Payer: Medicare Other

## 2017-05-29 ENCOUNTER — Emergency Department (HOSPITAL_COMMUNITY): Payer: Medicare Other

## 2017-05-29 DIAGNOSIS — J9601 Acute respiratory failure with hypoxia: Secondary | ICD-10-CM | POA: Diagnosis not present

## 2017-05-29 DIAGNOSIS — J441 Chronic obstructive pulmonary disease with (acute) exacerbation: Secondary | ICD-10-CM | POA: Diagnosis not present

## 2017-05-29 DIAGNOSIS — E781 Pure hyperglyceridemia: Secondary | ICD-10-CM | POA: Diagnosis not present

## 2017-05-29 DIAGNOSIS — E785 Hyperlipidemia, unspecified: Secondary | ICD-10-CM | POA: Diagnosis present

## 2017-05-29 DIAGNOSIS — I25118 Atherosclerotic heart disease of native coronary artery with other forms of angina pectoris: Secondary | ICD-10-CM | POA: Diagnosis present

## 2017-05-29 DIAGNOSIS — I214 Non-ST elevation (NSTEMI) myocardial infarction: Secondary | ICD-10-CM | POA: Diagnosis not present

## 2017-05-29 DIAGNOSIS — J9622 Acute and chronic respiratory failure with hypercapnia: Secondary | ICD-10-CM | POA: Diagnosis not present

## 2017-05-29 DIAGNOSIS — I255 Ischemic cardiomyopathy: Secondary | ICD-10-CM | POA: Diagnosis not present

## 2017-05-29 DIAGNOSIS — J90 Pleural effusion, not elsewhere classified: Secondary | ICD-10-CM | POA: Diagnosis not present

## 2017-05-29 DIAGNOSIS — G9341 Metabolic encephalopathy: Secondary | ICD-10-CM | POA: Diagnosis not present

## 2017-05-29 DIAGNOSIS — I25110 Atherosclerotic heart disease of native coronary artery with unstable angina pectoris: Secondary | ICD-10-CM | POA: Diagnosis not present

## 2017-05-29 DIAGNOSIS — I252 Old myocardial infarction: Secondary | ICD-10-CM | POA: Diagnosis not present

## 2017-05-29 DIAGNOSIS — R0600 Dyspnea, unspecified: Secondary | ICD-10-CM

## 2017-05-29 DIAGNOSIS — F17210 Nicotine dependence, cigarettes, uncomplicated: Secondary | ICD-10-CM | POA: Diagnosis not present

## 2017-05-29 DIAGNOSIS — D696 Thrombocytopenia, unspecified: Secondary | ICD-10-CM | POA: Diagnosis not present

## 2017-05-29 DIAGNOSIS — E871 Hypo-osmolality and hyponatremia: Secondary | ICD-10-CM | POA: Diagnosis not present

## 2017-05-29 DIAGNOSIS — R0602 Shortness of breath: Secondary | ICD-10-CM | POA: Diagnosis not present

## 2017-05-29 DIAGNOSIS — J449 Chronic obstructive pulmonary disease, unspecified: Secondary | ICD-10-CM | POA: Diagnosis not present

## 2017-05-29 DIAGNOSIS — J9621 Acute and chronic respiratory failure with hypoxia: Secondary | ICD-10-CM | POA: Diagnosis not present

## 2017-05-29 DIAGNOSIS — I5043 Acute on chronic combined systolic (congestive) and diastolic (congestive) heart failure: Secondary | ICD-10-CM | POA: Diagnosis not present

## 2017-05-29 DIAGNOSIS — J9690 Respiratory failure, unspecified, unspecified whether with hypoxia or hypercapnia: Secondary | ICD-10-CM | POA: Diagnosis not present

## 2017-05-29 DIAGNOSIS — I5031 Acute diastolic (congestive) heart failure: Secondary | ICD-10-CM | POA: Diagnosis not present

## 2017-05-29 DIAGNOSIS — I5023 Acute on chronic systolic (congestive) heart failure: Secondary | ICD-10-CM | POA: Diagnosis not present

## 2017-05-29 DIAGNOSIS — K219 Gastro-esophageal reflux disease without esophagitis: Secondary | ICD-10-CM | POA: Diagnosis present

## 2017-05-29 DIAGNOSIS — E876 Hypokalemia: Secondary | ICD-10-CM | POA: Diagnosis not present

## 2017-05-29 DIAGNOSIS — D62 Acute posthemorrhagic anemia: Secondary | ICD-10-CM | POA: Diagnosis not present

## 2017-05-29 DIAGNOSIS — J9602 Acute respiratory failure with hypercapnia: Secondary | ICD-10-CM | POA: Diagnosis not present

## 2017-05-29 DIAGNOSIS — I519 Heart disease, unspecified: Secondary | ICD-10-CM | POA: Diagnosis not present

## 2017-05-29 DIAGNOSIS — J939 Pneumothorax, unspecified: Secondary | ICD-10-CM | POA: Diagnosis not present

## 2017-05-29 DIAGNOSIS — Z9981 Dependence on supplemental oxygen: Secondary | ICD-10-CM | POA: Diagnosis not present

## 2017-05-29 DIAGNOSIS — I2510 Atherosclerotic heart disease of native coronary artery without angina pectoris: Secondary | ICD-10-CM | POA: Diagnosis not present

## 2017-05-29 DIAGNOSIS — Z01810 Encounter for preprocedural cardiovascular examination: Secondary | ICD-10-CM | POA: Diagnosis not present

## 2017-05-29 DIAGNOSIS — R739 Hyperglycemia, unspecified: Secondary | ICD-10-CM | POA: Diagnosis not present

## 2017-05-29 DIAGNOSIS — I5021 Acute systolic (congestive) heart failure: Secondary | ICD-10-CM | POA: Diagnosis not present

## 2017-05-29 DIAGNOSIS — I083 Combined rheumatic disorders of mitral, aortic and tricuspid valves: Secondary | ICD-10-CM | POA: Diagnosis not present

## 2017-05-29 DIAGNOSIS — I5020 Unspecified systolic (congestive) heart failure: Secondary | ICD-10-CM | POA: Diagnosis not present

## 2017-05-29 DIAGNOSIS — Z888 Allergy status to other drugs, medicaments and biological substances status: Secondary | ICD-10-CM | POA: Diagnosis not present

## 2017-05-29 DIAGNOSIS — I509 Heart failure, unspecified: Secondary | ICD-10-CM | POA: Diagnosis not present

## 2017-05-29 DIAGNOSIS — K5900 Constipation, unspecified: Secondary | ICD-10-CM | POA: Diagnosis not present

## 2017-05-29 DIAGNOSIS — J9811 Atelectasis: Secondary | ICD-10-CM | POA: Diagnosis not present

## 2017-05-29 LAB — CBC WITH DIFFERENTIAL/PLATELET
BASOS ABS: 0 10*3/uL (ref 0.0–0.1)
Basophils Relative: 0 %
EOS ABS: 0 10*3/uL (ref 0.0–0.7)
EOS PCT: 0 %
HCT: 37.5 % (ref 36.0–46.0)
Hemoglobin: 12.1 g/dL (ref 12.0–15.0)
LYMPHS PCT: 2 %
Lymphs Abs: 0.3 10*3/uL — ABNORMAL LOW (ref 0.7–4.0)
MCH: 30.3 pg (ref 26.0–34.0)
MCHC: 32.3 g/dL (ref 30.0–36.0)
MCV: 93.8 fL (ref 78.0–100.0)
Monocytes Absolute: 0.6 10*3/uL (ref 0.1–1.0)
Monocytes Relative: 4 %
NEUTROS PCT: 94 %
Neutro Abs: 14.1 10*3/uL — ABNORMAL HIGH (ref 1.7–7.7)
PLATELETS: 165 10*3/uL (ref 150–400)
RBC: 4 MIL/uL (ref 3.87–5.11)
RDW: 14 % (ref 11.5–15.5)
WBC: 15 10*3/uL — AB (ref 4.0–10.5)

## 2017-05-29 LAB — COMPREHENSIVE METABOLIC PANEL
ALT: 13 U/L — ABNORMAL LOW (ref 14–54)
AST: 26 U/L (ref 15–41)
Albumin: 3.3 g/dL — ABNORMAL LOW (ref 3.5–5.0)
Alkaline Phosphatase: 88 U/L (ref 38–126)
Anion gap: 8 (ref 5–15)
BILIRUBIN TOTAL: 0.9 mg/dL (ref 0.3–1.2)
BUN: 7 mg/dL (ref 6–20)
CHLORIDE: 89 mmol/L — AB (ref 101–111)
CO2: 32 mmol/L (ref 22–32)
Calcium: 8.4 mg/dL — ABNORMAL LOW (ref 8.9–10.3)
Creatinine, Ser: 0.79 mg/dL (ref 0.44–1.00)
Glucose, Bld: 221 mg/dL — ABNORMAL HIGH (ref 65–99)
POTASSIUM: 4.4 mmol/L (ref 3.5–5.1)
Sodium: 129 mmol/L — ABNORMAL LOW (ref 135–145)
TOTAL PROTEIN: 5.2 g/dL — AB (ref 6.5–8.1)

## 2017-05-29 LAB — BLOOD GAS, ARTERIAL
Acid-Base Excess: 5.7 mmol/L — ABNORMAL HIGH (ref 0.0–2.0)
BICARBONATE: 34 mmol/L — AB (ref 20.0–28.0)
Drawn by: 44166
FIO2: 60
LHR: 15 {breaths}/min
O2 SAT: 98.3 %
PATIENT TEMPERATURE: 98.6
PCO2 ART: 98.5 mmHg — AB (ref 32.0–48.0)
PEEP: 5 cmH2O
VT: 470 mL
pH, Arterial: 7.164 — CL (ref 7.350–7.450)
pO2, Arterial: 151 mmHg — ABNORMAL HIGH (ref 83.0–108.0)

## 2017-05-29 LAB — GLUCOSE, CAPILLARY
GLUCOSE-CAPILLARY: 155 mg/dL — AB (ref 65–99)
GLUCOSE-CAPILLARY: 127 mg/dL — AB (ref 65–99)
GLUCOSE-CAPILLARY: 108 mg/dL — AB (ref 65–99)
Glucose-Capillary: 132 mg/dL — ABNORMAL HIGH (ref 65–99)

## 2017-05-29 LAB — TRIGLYCERIDES: Triglycerides: 43 mg/dL (ref <150)

## 2017-05-29 LAB — I-STAT ARTERIAL BLOOD GAS, ED
Acid-Base Excess: 6 mmol/L — ABNORMAL HIGH (ref 0.0–2.0)
Bicarbonate: 34.2 mmol/L — ABNORMAL HIGH (ref 20.0–28.0)
O2 Saturation: 99 %
PCO2 ART: 59.2 mmHg — AB (ref 32.0–48.0)
PO2 ART: 130 mmHg — AB (ref 83.0–108.0)
Patient temperature: 95.6
TCO2: 36 mmol/L — ABNORMAL HIGH (ref 22–32)
pH, Arterial: 7.361 (ref 7.350–7.450)

## 2017-05-29 LAB — HIV ANTIBODY (ROUTINE TESTING W REFLEX): HIV Screen 4th Generation wRfx: NONREACTIVE

## 2017-05-29 LAB — BRAIN NATRIURETIC PEPTIDE
B NATRIURETIC PEPTIDE 5: 629.9 pg/mL — AB (ref 0.0–100.0)
B NATRIURETIC PEPTIDE 5: 1600.6 pg/mL — AB (ref 0.0–100.0)

## 2017-05-29 LAB — TROPONIN I
TROPONIN I: 0.97 ng/mL — AB (ref <0.03)
Troponin I: 0.59 ng/mL (ref <0.03)
Troponin I: 0.84 ng/mL (ref <0.03)

## 2017-05-29 LAB — I-STAT CG4 LACTIC ACID, ED: LACTIC ACID, VENOUS: 1.65 mmol/L (ref 0.5–1.9)

## 2017-05-29 LAB — ECHOCARDIOGRAM COMPLETE
Height: 64 in
Weight: 2215.18 oz

## 2017-05-29 LAB — MRSA PCR SCREENING: MRSA by PCR: NEGATIVE

## 2017-05-29 LAB — I-STAT TROPONIN, ED: Troponin i, poc: 0.04 ng/mL (ref 0.00–0.08)

## 2017-05-29 LAB — MAGNESIUM: MAGNESIUM: 2.2 mg/dL (ref 1.7–2.4)

## 2017-05-29 LAB — PROCALCITONIN: Procalcitonin: 0.22 ng/mL

## 2017-05-29 LAB — PHOSPHORUS: PHOSPHORUS: 2.5 mg/dL (ref 2.5–4.6)

## 2017-05-29 MED ORDER — INSULIN ASPART 100 UNIT/ML ~~LOC~~ SOLN
2.0000 [IU] | SUBCUTANEOUS | Status: DC
  Administered 2017-05-29 (×2): 2 [IU] via SUBCUTANEOUS
  Administered 2017-05-30: 4 [IU] via SUBCUTANEOUS
  Administered 2017-05-30: 2 [IU] via SUBCUTANEOUS
  Administered 2017-05-30: 4 [IU] via SUBCUTANEOUS
  Administered 2017-05-30: 2 [IU] via SUBCUTANEOUS

## 2017-05-29 MED ORDER — CHLORHEXIDINE GLUCONATE 0.12% ORAL RINSE (MEDLINE KIT)
15.0000 mL | Freq: Two times a day (BID) | OROMUCOSAL | Status: DC
  Administered 2017-05-29 – 2017-05-30 (×3): 15 mL via OROMUCOSAL

## 2017-05-29 MED ORDER — SODIUM CHLORIDE 0.9 % IV SOLN: 250.0000 mL | INTRAVENOUS | Status: DC | PRN

## 2017-05-29 MED ORDER — FUROSEMIDE 10 MG/ML IJ SOLN
40.0000 mg | Freq: Two times a day (BID) | INTRAMUSCULAR | Status: DC
  Administered 2017-05-29 – 2017-05-31 (×4): 40 mg via INTRAVENOUS
  Filled 2017-05-29 (×4): qty 4

## 2017-05-29 MED ORDER — VITAL HIGH PROTEIN PO LIQD
1000.0000 mL | ORAL | Status: DC
  Administered 2017-05-29 (×3)
  Administered 2017-05-29: 1000 mL
  Administered 2017-05-29 – 2017-05-30 (×10)

## 2017-05-29 MED ORDER — PROPOFOL 1000 MG/100ML IV EMUL
5.0000 ug/kg/min | Freq: Once | INTRAVENOUS | Status: AC
  Administered 2017-05-29: 5 ug/kg/min via INTRAVENOUS

## 2017-05-29 MED ORDER — SODIUM CHLORIDE 0.9% FLUSH
3.0000 mL | INTRAVENOUS | Status: DC | PRN
  Administered 2017-05-30: 3 mL via INTRAVENOUS
  Filled 2017-05-29: qty 3

## 2017-05-29 MED ORDER — FENTANYL CITRATE (PF) 100 MCG/2ML IJ SOLN
50.0000 ug | INTRAMUSCULAR | Status: DC | PRN
  Administered 2017-05-29: 50 ug via INTRAVENOUS
  Filled 2017-05-29: qty 2

## 2017-05-29 MED ORDER — FAMOTIDINE IN NACL 20-0.9 MG/50ML-% IV SOLN
20.0000 mg | Freq: Two times a day (BID) | INTRAVENOUS | Status: DC
  Administered 2017-05-29 – 2017-05-30 (×5): 20 mg via INTRAVENOUS
  Filled 2017-05-29 (×7): qty 50

## 2017-05-29 MED ORDER — FENTANYL CITRATE (PF) 100 MCG/2ML IJ SOLN
50.0000 ug | INTRAMUSCULAR | Status: DC | PRN
Start: 2017-05-29 — End: 2017-06-01
  Administered 2017-05-30: 50 ug via INTRAVENOUS
  Filled 2017-05-29: qty 2

## 2017-05-29 MED ORDER — ORAL CARE MOUTH RINSE
15.0000 mL | Freq: Four times a day (QID) | OROMUCOSAL | Status: DC
  Administered 2017-05-29 – 2017-05-30 (×4): 15 mL via OROMUCOSAL

## 2017-05-29 MED ORDER — IPRATROPIUM BROMIDE 0.02 % IN SOLN: 0.5000 mg | RESPIRATORY_TRACT | Status: DC | PRN

## 2017-05-29 MED ORDER — SODIUM CHLORIDE 0.9% FLUSH
3.0000 mL | Freq: Two times a day (BID) | INTRAVENOUS | Status: DC
  Administered 2017-05-30 – 2017-05-31 (×3): 3 mL via INTRAVENOUS

## 2017-05-29 MED ORDER — PROPOFOL 1000 MG/100ML IV EMUL
INTRAVENOUS | Status: AC
  Filled 2017-05-29: qty 100

## 2017-05-29 MED ORDER — FUROSEMIDE 10 MG/ML IJ SOLN
40.0000 mg | Freq: Once | INTRAMUSCULAR | Status: AC
  Administered 2017-05-29: 40 mg via INTRAVENOUS
  Filled 2017-05-29: qty 4

## 2017-05-29 MED ORDER — HEPARIN SODIUM (PORCINE) 5000 UNIT/ML IJ SOLN
5000.0000 [IU] | Freq: Three times a day (TID) | INTRAMUSCULAR | Status: DC
  Administered 2017-05-29 – 2017-05-30 (×3): 5000 [IU] via SUBCUTANEOUS
  Filled 2017-05-29 (×4): qty 1

## 2017-05-29 MED ORDER — LEVOFLOXACIN IN D5W 750 MG/150ML IV SOLN
750.0000 mg | INTRAVENOUS | Status: DC
  Administered 2017-05-29 – 2017-05-31 (×3): 750 mg via INTRAVENOUS
  Filled 2017-05-29 (×3): qty 150

## 2017-05-29 MED ORDER — PROPOFOL 1000 MG/100ML IV EMUL
0.0000 ug/kg/min | INTRAVENOUS | Status: DC
  Administered 2017-05-29: 35 ug/kg/min via INTRAVENOUS
  Administered 2017-05-29 (×2): 25 ug/kg/min via INTRAVENOUS
  Administered 2017-05-29: 35.019 ug/kg/min via INTRAVENOUS
  Administered 2017-05-30: 44.963 ug/kg/min via INTRAVENOUS
  Filled 2017-05-29 (×5): qty 100

## 2017-05-29 MED ORDER — METHYLPREDNISOLONE SODIUM SUCC 40 MG IJ SOLR
40.0000 mg | Freq: Two times a day (BID) | INTRAMUSCULAR | Status: DC
  Administered 2017-05-29 – 2017-05-31 (×5): 40 mg via INTRAVENOUS
  Filled 2017-05-29 (×5): qty 1

## 2017-05-29 MED ORDER — IPRATROPIUM-ALBUTEROL 0.5-2.5 (3) MG/3ML IN SOLN
3.0000 mL | Freq: Four times a day (QID) | RESPIRATORY_TRACT | Status: DC
  Administered 2017-05-29 – 2017-06-03 (×20): 3 mL via RESPIRATORY_TRACT
  Filled 2017-05-29 (×21): qty 3

## 2017-05-29 NOTE — ED Triage Notes (Signed)
Pt brought to ED by [facility] from home for respiratory distress, SPO2 50% on EMS arrival, placed on CPAP, spo2 up to 76%, 15 Albuterol, 1 troven, 125 mg SOlumedro, 2 nitros given pta to ed by ems. SBP 110, HR 120, R 34.

## 2017-05-29 NOTE — Progress Notes (Signed)
Initial Nutrition Assessment  DOCUMENTATION CODES:   Not applicable  INTERVENTION:    Vital High Protein at 45 ml/h (1080 ml per day)  Provides 1080 kcal (1508 total kcal with Propofol), 95 gm protein, 903 ml free water daily  NUTRITION DIAGNOSIS:   Inadequate oral intake related to inability to eat as evidenced by NPO status.  GOAL:   Patient will meet greater than or equal to 90% of their needs  MONITOR:   Vent status, TF tolerance, Labs, I & O's  REASON FOR ASSESSMENT:   Consult (Verbal from Dr. Isaiah Serge) Enteral/tube feeding initiation and management  ASSESSMENT:   66 yo female with PMH of COPD gold IV and diastolic heart failure who was admitted on 10/8 with respiratory failure requiring intubation.  Spoke with CCM MD regarding nutrition plans for patient. Patient will remain intubated today, RD to order TF. Nutrition-Focused physical exam completed. Findings are no fat depletion, mild-moderate muscle depletion, and mild edema.  Patient is currently intubated on ventilator support MV: 11.7 L/min Temp (24hrs), Avg:97.9 F (36.6 C), Min:95.9 F (35.5 C), Max:99.3 F (37.4 C)  Propofol: 16.2 ml/hr providing 428 kcal from lipid Labs reviewed. Sodium 129 (L) CBG's: 155-127 Medications reviewed and include Lasix, solumedrol, propofol.  Diet Order:  Diet NPO time specified  Skin:  Reviewed, no issues  Last BM:  PTA  Height:   Ht Readings from Last 1 Encounters:  05/29/17  (1.626 m)    Weight:   Wt Readings from Last 1 Encounters:  05/29/17 138 lb 7.2 oz (62.8 kg)    Ideal Body Weight:  54.5 kg  BMI:  Body mass index is 23.76 kg/m.  Estimated Nutritional Needs:   Kcal:  1515  Protein:  90-100 gm  Fluid:  1.5 L  EDUCATION NEEDS:   No education needs identified at this time  Joaquin Courts, RD, LDN, CNSC Pager [phone] After Hours Pager [phone]

## 2017-05-29 NOTE — ED Provider Notes (Signed)
[facility]-EMERGENCY DEPT Provider Note   CSN: [id number] Arrival date & time: 05/28/17  2345     History   Chief Complaint Chief Complaint  Patient presents with  . Respiratory Distress    HPI Samantha Dyer is a 66 y.o. female.  Patient brought to the emergency department from home by ambulance in respiratory distress. Patient has a history of COPD requiring previous intubation. She was severely dyspneic at arrival, cannot provide any further information. EMS report that she was cyanotic with oxygen saturations of 50% upon their arrival. Patient was given albuterol 15 mg, Atrovent 1 mg, Solu-Medrol 125 mg, magnesium 2 g and placed on CPAP. She only improved to the mid 70% range on all of the above-mentioned treatment. At arrival, patient is still in distress, leaning forward, tripoding, noncommunicative. Level V Caveat due to acuity.      Past Medical History:  Diagnosis Date  . COPD (chronic obstructive pulmonary disease) (HCC)     There are no active problems to display for this patient.   History reviewed. No pertinent surgical history.  OB History    No data available       Home Medications    Prior to Admission medications   Not on File    Family History History reviewed. No pertinent family history.  Social History Social History  Substance Use Topics  . Smoking status: Current Every Day Smoker    Packs/day: 1.00    Types: Cigarettes  . Smokeless tobacco: Never Used  . Alcohol use No     Allergies   Patient has no known allergies.   Review of Systems Review of Systems  Unable to perform ROS: Acuity of condition     Physical Exam Updated Vital Signs BP 136/74   Pulse (!) 105   Temp (!) 95.9 F (35.5 C) (Rectal)   Resp (!) 21   Ht  (1.626 m)   Wt 77.1 kg (170 lb)   SpO2 100%   BMI 29.18 kg/m   Physical Exam  Constitutional: She is oriented to person, place, and time. She appears well-developed and well-nourished. She appears  distressed.  HENT:  Head: Normocephalic and atraumatic.  Right Ear: Hearing normal.  Left Ear: Hearing normal.  Nose: Nose normal.  Mouth/Throat: Oropharynx is clear and moist and mucous membranes are normal.  Eyes: Pupils are equal, round, and reactive to light. Conjunctivae and EOM are normal.  Neck: Normal range of motion. Neck supple.  Cardiovascular: Regular rhythm, S1 normal and S2 normal.  Tachycardia present.  Exam reveals no gallop and no friction rub.   No murmur heard. Pulmonary/Chest: Accessory muscle usage present. Tachypnea noted. She is in respiratory distress. She has decreased breath sounds. She exhibits no tenderness.  Abdominal: Soft. Normal appearance and bowel sounds are normal. There is no hepatosplenomegaly. There is no tenderness. There is no rebound, no guarding, no tenderness at McBurney's point and negative Murphy's sign. No hernia.  Musculoskeletal: Normal range of motion.  Neurological: She is alert and oriented to person, place, and time. She has normal strength. No cranial nerve deficit or sensory deficit. Coordination normal. GCS eye subscore is 4. GCS verbal subscore is 5. GCS motor subscore is 6.  Skin: Skin is warm and intact. No rash noted. She is diaphoretic. No cyanosis.  Psychiatric: She has a normal mood and affect. Her speech is normal and behavior is normal. Thought content normal.  Nursing note and vitals reviewed.    ED Treatments / Results  Labs (  all labs ordered are listed, but only abnormal results are displayed) Labs Reviewed  CBC WITH DIFFERENTIAL/PLATELET - Abnormal; Notable for the following:       Result Value   WBC 15.0 (*)    Neutro Abs 14.1 (*)    Lymphs Abs 0.3 (*)    All other components within normal limits  COMPREHENSIVE METABOLIC PANEL - Abnormal; Notable for the following:    Sodium 129 (*)    Chloride 89 (*)    Glucose, Bld 221 (*)    Calcium 8.4 (*)    Total Protein 5.2 (*)    Albumin 3.3 (*)    ALT 13 (*)    All  other components within normal limits  BRAIN NATRIURETIC PEPTIDE - Abnormal; Notable for the following:    B Natriuretic Peptide 629.9 (*)    All other components within normal limits  BLOOD GAS, ARTERIAL - Abnormal; Notable for the following:    pH, Arterial 7.164 (*)    pCO2 arterial 98.5 (*)    pO2, Arterial 151 (*)    Bicarbonate 34.0 (*)    Acid-Base Excess 5.7 (*)    All other components within normal limits  I-STAT ARTERIAL BLOOD GAS, ED - Abnormal; Notable for the following:    pCO2 arterial 59.2 (*)    pO2, Arterial 130.0 (*)    Bicarbonate 34.2 (*)    TCO2 36 (*)    Acid-Base Excess 6.0 (*)    All other components within normal limits  I-STAT CG4 LACTIC ACID, ED  I-STAT TROPONIN, ED    EKG  EKG Interpretation None       Radiology Dg Chest Port 1 View  Result Date: 05/29/2017 CLINICAL DATA:  Respiratory failure, intubation, COPD, smoker EXAM: PORTABLE CHEST 1 VIEW COMPARISON:  Portable exam 0008 hours without priors for comparison FINDINGS: Tip of endotracheal tube projects 13 mm above carina. Proximal side-port of nasogastric tube is at or just above the gastroesophageal junction. Normal heart size mediastinal contours. Atherosclerotic calcification aorta. Pulmonary vascular congestion with mild infiltrates BILATERAL interstitial infiltrates favoring pulmonary edema though infection not excluded. No pneumothorax. Bones demineralized. IMPRESSION: Scattered interstitial infiltrates favor edema over infection. Tube positions as above. Electronically Signed   By: Ulyses Southward M.D.   On: 05/29/2017 00:40    Procedures Procedure Name: Intubation Date/Time: 05/29/2017 11:56 PM Performed by: Gilda Crease Pre-anesthesia Checklist: Patient identified, Emergency Drugs available, Suction available, Timeout performed and Patient being monitored Oxygen Delivery Method: Ambu bag Preoxygenation: Pre-oxygenation with 100% oxygen Induction Type: Rapid sequence Ventilation:  Mask ventilation without difficulty Laryngoscope Size: Glidescope and 3 Tube size: 7.5 mm Number of attempts: 1 Placement Confirmation: ETT inserted through vocal cords under direct vision,  CO2 detector and Breath sounds checked- equal and bilateral Secured at: 23 cm Tube secured with: ETT holder Dental Injury: Teeth and Oropharynx as per pre-operative assessment  Future Recommendations: Recommend- induction with short-acting agent, and alternative techniques readily available      (including critical care time)  Medications Ordered in ED Medications  propofol (DIPRIVAN) 1000 MG/100ML infusion (  Hold 05/29/17 0014)  furosemide (LASIX) injection 40 mg (not administered)  etomidate (AMIDATE) injection (20 mg Intravenous Given 05/28/17 2354)  succinylcholine (ANECTINE) injection (100 mg Intravenous Given 05/28/17 2355)  propofol (DIPRIVAN) 1000 MG/100ML infusion (10 mcg/kg/min  77.1 kg Intravenous Rate/Dose Change 05/29/17 0027)     Initial Impression / Assessment and Plan / ED Course  I have reviewed the triage vital signs and the nursing  notes.  Pertinent labs & imaging results that were available during my care of the patient were reviewed by me and considered in my medical decision making (see chart for details).     Patient presented to the emergency department for evaluation of severe respiratory distress. Patient has a history of severe COPD with previous intubations secondary to COPD exacerbation. She was unable to provide any information about what led up to this, but EMS report that she was severely dyspneic and hypoxic with cyanosis upon their arrival. She was maximally treated by EMS including magnesium, Solu-Medrol, albuterol, Atrovent and CPAP with onlysome improvement. Oxygen saturations 76% on a high percent SPO2 via CPAP at arrival. She was still tripoding, dyspneic, diaphoretic. Decision was made to intubate her. This was performed without difficulty.  Chest x-ray does  not show obvious pneumonia, but does suggest possible edema. She does have an elevated BNP. Patient administered Lasix IV. She has been placed on dip Ativan for sedation and has done well. Vital signs are improved. Oxygenation is excellent on ventilator. Initial blood gas showed significant CO2 retention which is improving. Will admit to the ICU.  CRITICAL CARE Performed by: Gilda Crease   Total critical care time: 35 minutes  Critical care time was exclusive of separately billable procedures and treating other patients.  Critical care was necessary to treat or prevent imminent or life-threatening deterioration.  Critical care was time spent personally by me on the following activities: development of treatment plan with patient and/or surrogate as well as nursing, discussions with consultants, evaluation of patient's response to treatment, examination of patient, obtaining history from patient or surrogate, ordering and performing treatments and interventions, ordering and review of laboratory studies, ordering and review of radiographic studies, pulse oximetry and re-evaluation of patient's condition.  Final Clinical Impressions(s) / ED Diagnoses   Final diagnoses:  COPD exacerbation (HCC)  Acute respiratory failure with hypoxia and hypercapnia (HCC)    New Prescriptions New Prescriptions   No medications on file     Gilda Crease, MD 05/29/17 [phone]

## 2017-05-29 NOTE — Progress Notes (Signed)
Transported on the ventilator without incident to MICU.  Settings remain the same.

## 2017-05-29 NOTE — Progress Notes (Signed)
  Echocardiogram 2D Echocardiogram has been performed.  Samantha Dyer F 05/29/2017, 11:12 AM

## 2017-05-29 NOTE — Progress Notes (Signed)
PULMONARY / CRITICAL CARE MEDICINE   Name: Samantha Dyer MRN: [id number] DOB: 1951/04/01    ADMISSION DATE:  05/28/2017 CONSULTATION DATE:  05/29/2017  REFERRING MD:  Dr. Blinda Leatherwood  CHIEF COMPLAINT:  Respiratory failure  HISTORY OF PRESENT ILLNESS:  Patient is encephalopathic and/or intubated. Therefore history has been obtained from chart review. 66 year old female with history of COPD gold IV and diastolic heart failure. On 2L O2 baseline. She has been admitted and intubated in the past. According to clinic notes she continues to smoke. She called EMS in the late PM hours of 10/8 for dyspnea. Upon their arrival she was found to have O2 sats in the 50s and was cyanotic. She was treated with duoneb, magnesium, solumedrol and CPAP and only improved to O2 sats in 70s. She was intubated in ED. No wheeze on presentation, but did sound "wet" per RN. CXR consistent with pulmonary edema. Lasix was ordered. PCCM asked to admit.   PAST MEDICAL HISTORY : chart merge pending see above.  She  has a past medical history of COPD (chronic obstructive pulmonary disease) (HCC).  PAST SURGICAL HISTORY: chart merge pending see above She  has no past surgical history on file.  Allergies  Allergen Reactions  . Augmentin [Amoxicillin-Pot Clavulanate] Swelling    No current facility-administered medications on file prior to encounter.    No current outpatient prescriptions on file prior to encounter.    FAMILY HISTORY:  Her has no family status information on file.    SOCIAL HISTORY: She  reports that she has been smoking Cigarettes.  She has been smoking about 1.00 pack per day. She has never used smokeless tobacco. She reports that she does not drink alcohol or use drugs.  REVIEW OF SYSTEMS:   Unable as patient is encephalopathic and intubated  SUBJECTIVE:    VITAL SIGNS: BP 109/60   Pulse 89   Temp 98.8 F (37.1 C) (Oral)   Resp (!) 24   Ht  (1.626 m)   Wt 138 lb 7.2 oz (62.8 kg)    SpO2 97%   BMI 23.76 kg/m   HEMODYNAMICS:    VENTILATOR SETTINGS: Vent Mode: PRVC FiO2 (%):  [30 %-60 %] 30 % Set Rate:  [15 bmp-24 bmp] 24 bmp Vt Set:  [470 mL] 470 mL PEEP:  [5 cmH20] 5 cmH20 Plateau Pressure:  [17 cmH20-23 cmH20] 17 cmH20  INTAKE / OUTPUT: I/O last 3 completed shifts: In: 200 [IV Piggyback:200] Out: 800 [Emesis/NG output:800]  PHYSICAL EXAMINATION: Gen:      No acute distress HEENT:  EOMI, sclera anicteric Neck:     No masses; no thyromegaly, ETT in place Lungs:    Clear to auscultation bilaterally; normal respiratory effort CV:         Regular rate and rhythm; no murmurs Abd:      + bowel sounds; soft, non-tender; no palpable masses, no distension Ext:    No edema; adequate peripheral perfusion Skin:      Warm and dry; no rash Neuro: Sedated, arousable  LABS:  BMET  Recent Labs Lab 05/29/17 0127  NA 129*  K 4.4  CL 89*  CO2 32  BUN 7  CREATININE 0.79  GLUCOSE 221*    Electrolytes  Recent Labs Lab 05/29/17 0127 05/29/17 0549  CALCIUM 8.4*  --   MG  --  2.2  PHOS  --  2.5    CBC  Recent Labs Lab 05/29/17 0127  WBC 15.0*  HGB 12.1  HCT  37.5  PLT 165    Coag's No results for input(s): APTT, INR in the last 168 hours.  Sepsis Markers  Recent Labs Lab 05/29/17 0134 05/29/17 0549  LATICACIDVEN 1.65  --   PROCALCITON  --  0.22    ABG  Recent Labs Lab 05/29/17 0055 05/29/17 0157  PHART 7.164* 7.361  PCO2ART 98.5* 59.2*  PO2ART 151* 130.0*    Liver Enzymes  Recent Labs Lab 05/29/17 0127  AST 26  ALT 13*  ALKPHOS 88  BILITOT 0.9  ALBUMIN 3.3*    Cardiac Enzymes  Recent Labs Lab 05/29/17 0549 05/29/17 0856  TROPONINI 0.59* 0.84*    Glucose  Recent Labs Lab 05/29/17 0510 05/29/17 1119  GLUCAP 155* 127*    Imaging Dg Chest Port 1 View  Result Date: 05/29/2017 CLINICAL DATA:  Respiratory failure, intubation, COPD, smoker EXAM: PORTABLE CHEST 1 VIEW COMPARISON:  Portable exam 0008 hours  without priors for comparison FINDINGS: Tip of endotracheal tube projects 13 mm above carina. Proximal side-port of nasogastric tube is at or just above the gastroesophageal junction. Normal heart size mediastinal contours. Atherosclerotic calcification aorta. Pulmonary vascular congestion with mild infiltrates BILATERAL interstitial infiltrates favoring pulmonary edema though infection not excluded. No pneumothorax. Bones demineralized. IMPRESSION: Scattered interstitial infiltrates favor edema over infection. Tube positions as above. Electronically Signed   By: Ulyses Southward M.D.   On: 05/29/2017 00:40    STUDIES:   CULTURES:  ANTIBIOTICS: Levaquin 10/9>>  SIGNIFICANT EVENTS: 10/9: Admit  LINES/TUBES: ETT 10/8>>>  DISCUSSION: 66 year old female with known severe COPD on home O2, and known diastolic CHF admitted for mixed respiratory failure. Most likely due to pulmonary edema in the setting of decompensated heart failure, but the differential also includes COPD exacerbation and less likely community-acquired pneumonia. Plan to treat with diuresis, mechanical ventilation, nebulized bronchodilators, intravenous steroids, and Antibiotics.  ASSESSMENT / PLAN:  PULMONARY A: Acute on chronic hypoxemic/hypercarbic respiratory failure Pulmonary edema Possible acute exacerbation of COPD Possible CAP  P:   Continue vent support SBTs as tolerated. Continue nebs, solumedrol  CARDIOVASCULAR A:  Acute on chronic diastolic heart failure Elevated troponin. P:  Continue to trend troponins. Follow echo. Consider cardiology consult.   RENAL A:   Hyponatremia  P:   Lasix. 40 mg IV q12 Follow BMP Replete lytes  GASTROINTESTINAL A:   History of GERD P:   Pepcid Nothing by mouth  HEMATOLOGIC A:   No acute issues P:  Subcutaneous heparin Follow CBC  INFECTIOUS A:   Rule out pneumonia given SIRS criteria and chest x-ray changes  P:   Levaquin Blood cultures Sputum  cultures PCT Low threshold to deescalate abx  ENDOCRINE A:   Hyperglycemia with no history of diabetes mellitus  P:   Blood glucose monitoring and sliding scale insulin  NEUROLOGIC A:   Acute metabolic encephalopathy P:   RASS goal: -1 to -2 Propofol infusion for sedation PRN fentanyl for analgesia.  FAMILY  - Updates: No family available - Inter-disciplinary family meet or Palliative Care meeting due by:  10/16  The patient is critically ill with multiple organ system failure and requires high complexity decision making for assessment and support, frequent evaluation and titration of therapies, advanced monitoring, review of radiographic studies and interpretation of complex data.   Additional critical Care Time devoted to patient care services, exclusive of separately billable procedures, described in this note is 35 minutes.   Chilton Greathouse MD [facility] Pager [phone] If no answer  or after 3pm call: [phone] 05/29/2017, 3:09 PM

## 2017-05-29 NOTE — Progress Notes (Signed)
Pharmacy Antibiotic Note  Samantha Dyer is a 66 y.o. female admitted on 05/28/2017 with respiratory distress requiring intubation w/ COPD and possible CAP.  Pharmacy has been consulted for Levaquin dosing.  Plan: Levaquin  IV Q24H.  Height:  (162.6 cm) Weight: 170 lb (77.1 kg) IBW/kg (Calculated) : 54.7  Temp (24hrs), Avg:95.9 F (35.5 C), Min:95.9 F (35.5 C), Max:95.9 F (35.5 C)   Recent Labs Lab 05/29/17 0127 05/29/17 0134  WBC 15.0*  --   CREATININE 0.79  --   LATICACIDVEN  --  1.65    Estimated Creatinine Clearance: 70.5 mL/min (by C-G formula based on SCr of 0.79 mg/dL).    No Known Allergies   Thank you for allowing pharmacy to be a part of this patient's care.  Vernard Gambles, PharmD, BCPS  05/29/2017 3:44 AM

## 2017-05-29 NOTE — H&P (Signed)
PULMONARY / CRITICAL CARE MEDICINE   Name: Samantha Dyer MRN: [id number] DOB: 1951/07/15    ADMISSION DATE:  05/28/2017 CONSULTATION DATE:  05/29/2017  REFERRING MD:  Dr. Blinda Leatherwood  CHIEF COMPLAINT:  Respiratory failure  HISTORY OF PRESENT ILLNESS:  Patient is encephalopathic and/or intubated. Therefore history has been obtained from chart review. 66 year old female with history of COPD gold IV and diastolic heart failure. On 2L O2 baseline. She has been admitted and intubated in the past. According to clinic notes she continues to smoke. She called EMS in the late PM hours of 10/8 for dyspnea. Upon their arrival she was found to have O2 sats in the 50s and was cyanotic. She was treated with duoneb, magnesium, solumedrol and CPAP and only improved to O2 sats in 70s. She was intubated in ED. No wheeze on presentation, but did sound "wet" per RN. CXR consistent with pulmonary edema. Lasix was ordered. PCCM asked to admit.   PAST MEDICAL HISTORY : chart merge pending see above.  She  has a past medical history of COPD (chronic obstructive pulmonary disease) (HCC).  PAST SURGICAL HISTORY: chart merge pending see above She  has no past surgical history on file.  No Known Allergies  No current facility-administered medications on file prior to encounter.    No current outpatient prescriptions on file prior to encounter.    FAMILY HISTORY:  Her has no family status information on file.    SOCIAL HISTORY: She  reports that she has been smoking Cigarettes.  She has been smoking about 1.00 pack per day. She has never used smokeless tobacco. She reports that she does not drink alcohol or use drugs.  REVIEW OF SYSTEMS:   Unable as patient is encephalopathic and intubated  SUBJECTIVE:    VITAL SIGNS: BP 136/74   Pulse (!) 105   Temp (!) 95.9 F (35.5 C) (Rectal)   Resp (!) 21   Ht  (1.626 m)   Wt 77.1 kg (170 lb)   SpO2 100%   BMI 29.18 kg/m   HEMODYNAMICS:    VENTILATOR  SETTINGS: Vent Mode: PRVC FiO2 (%):  [45 %-60 %] 45 % Set Rate:  [15 bmp-24 bmp] 24 bmp Vt Set:  [470 mL] 470 mL PEEP:  [5 cmH20] 5 cmH20  INTAKE / OUTPUT: No intake/output data recorded.  PHYSICAL EXAMINATION: General:  Elderly female of normal body habitus Neuro:  Sedated, RASS -1 HEENT:  Normocephalic, atraumatic Cardiovascular:  RRR, no MRG. Trace to +1 pitting edema of bilateral lower extremities Lungs:  Coarse crackles, unlabored on vent Abdomen:  Soft, nondistended Musculoskeletal:  No acute deformity Skin:  Grossly intact  LABS:  BMET  Recent Labs Lab 05/29/17 0127  NA 129*  K 4.4  CL 89*  CO2 32  BUN 7  CREATININE 0.79  GLUCOSE 221*    Electrolytes  Recent Labs Lab 05/29/17 0127  CALCIUM 8.4*    CBC  Recent Labs Lab 05/29/17 0127  WBC 15.0*  HGB 12.1  HCT 37.5  PLT 165    Coag's No results for input(s): APTT, INR in the last 168 hours.  Sepsis Markers  Recent Labs Lab 05/29/17 0134  LATICACIDVEN 1.65    ABG  Recent Labs Lab 05/29/17 0055 05/29/17 0157  PHART 7.164* 7.361  PCO2ART 98.5* 59.2*  PO2ART 151* 130.0*    Liver Enzymes  Recent Labs Lab 05/29/17 0127  AST 26  ALT 13*  ALKPHOS 88  BILITOT 0.9  ALBUMIN 3.3*  Cardiac Enzymes No results for input(s): TROPONINI, PROBNP in the last 168 hours.  Glucose No results for input(s): GLUCAP in the last 168 hours.  Imaging Dg Chest Port 1 View  Result Date: 05/29/2017 CLINICAL DATA:  Respiratory failure, intubation, COPD, smoker EXAM: PORTABLE CHEST 1 VIEW COMPARISON:  Portable exam 0008 hours without priors for comparison FINDINGS: Tip of endotracheal tube projects 13 mm above carina. Proximal side-port of nasogastric tube is at or just above the gastroesophageal junction. Normal heart size mediastinal contours. Atherosclerotic calcification aorta. Pulmonary vascular congestion with mild infiltrates BILATERAL interstitial infiltrates favoring pulmonary edema though  infection not excluded. No pneumothorax. Bones demineralized. IMPRESSION: Scattered interstitial infiltrates favor edema over infection. Tube positions as above. Electronically Signed   By: Ulyses Southward M.D.   On: 05/29/2017 00:40     STUDIES:    CULTURES:   ANTIBIOTICS: Levaquin 10/9>>  SIGNIFICANT EVENTS: 10/9: Admit  LINES/TUBES: ETT 10/8>>>  DISCUSSION: 66 year old female with known severe COPD on home O2, and known diastolic CHF admitted for mixed respiratory failure. Most likely due to pulmonary edema in the setting of decompensated heart failure, but the differential also includes COPD exacerbation and less likely community-acquired pneumonia. Plan to treat with diuresis, mechanical ventilation, nebulized bronchodilators, intravenous steroids, and Antibiotics.  ASSESSMENT / PLAN:  PULMONARY A: Acute on chronic hypoxemic/hypercarbic respiratory failure Pulmonary edema Possible acute exacerbation of COPD Possible CAP  P:   Full vent support SBT in for morning rounds ABG improved Ventilator associated pneumonia bundle Scheduled DuoNeb's IV steroids low threshold for rapid taper/DC When necessary albuterol Diuresis, 40 mg IV Lasix received. See ID section  CARDIOVASCULAR A:  Acute on chronic diastolic heart failure  P:  Telemetry monitoring Strict I&O Urinary catheter Diuresis Echocardiogram Trend BNP Trend troponin  RENAL A:   Hyponatremia  P:   Lasix Follow BMP Check mag/phos  GASTROINTESTINAL A:   History of GERD P:   Pepcid Nothing by mouth  HEMATOLOGIC A:   No acute issues P:  Subcutaneous heparin Follow CBC  INFECTIOUS A:   Rule out pneumonia given SIRS criteria and chest x-ray changes  P:   Levaquin Blood cultures Sputum cultures PCT Low threshold to deescalate abx  ENDOCRINE A:   Hyperglycemia with no history of diabetes mellitus  P:   Blood glucose monitoring and sliding scale insulin  NEUROLOGIC A:   Acute  metabolic encephalopathy P:   RASS goal: -1 to -2 Propofol infusion for sedation When necessary fentanyl for analgesia   FAMILY  - Updates: No family available  - Inter-disciplinary family meet or Palliative Care meeting due by:  10/16  Joneen Roach, AGACNP-BC [facility] Pager [phone] or [phone]  05/29/2017 3:36 AM

## 2017-05-30 ENCOUNTER — Inpatient Hospital Stay (HOSPITAL_COMMUNITY): Payer: Medicare Other

## 2017-05-30 DIAGNOSIS — I214 Non-ST elevation (NSTEMI) myocardial infarction: Principal | ICD-10-CM

## 2017-05-30 LAB — BASIC METABOLIC PANEL
ANION GAP: 11 (ref 5–15)
ANION GAP: 8 (ref 5–15)
BUN: 12 mg/dL (ref 6–20)
BUN: 21 mg/dL — ABNORMAL HIGH (ref 6–20)
CALCIUM: 8.6 mg/dL — AB (ref 8.9–10.3)
CALCIUM: 8.5 mg/dL — AB (ref 8.9–10.3)
CHLORIDE: 90 mmol/L — AB (ref 101–111)
CO2: 29 mmol/L (ref 22–32)
CO2: 34 mmol/L — AB (ref 22–32)
CREATININE: 0.84 mg/dL (ref 0.44–1.00)
Chloride: 90 mmol/L — ABNORMAL LOW (ref 101–111)
Creatinine, Ser: 0.91 mg/dL (ref 0.44–1.00)
GFR calc Af Amer: >60 mL/min (ref >60)
GFR calc non Af Amer: >60 mL/min (ref >60)
GFR calc non Af Amer: >60 mL/min (ref >60)
GLUCOSE: 137 mg/dL — AB (ref 65–99)
GLUCOSE: 121 mg/dL — AB (ref 65–99)
POTASSIUM: 4.4 mmol/L (ref 3.5–5.1)
Potassium: 3 mmol/L — ABNORMAL LOW (ref 3.5–5.1)
Sodium: 130 mmol/L — ABNORMAL LOW (ref 135–145)
Sodium: 132 mmol/L — ABNORMAL LOW (ref 135–145)

## 2017-05-30 LAB — GLUCOSE, CAPILLARY
GLUCOSE-CAPILLARY: 113 mg/dL — AB (ref 65–99)
GLUCOSE-CAPILLARY: 127 mg/dL — AB (ref 65–99)
Glucose-Capillary: 153 mg/dL — ABNORMAL HIGH (ref 65–99)

## 2017-05-30 LAB — PHOSPHORUS: PHOSPHORUS: 3.3 mg/dL (ref 2.5–4.6)

## 2017-05-30 LAB — RESPIRATORY PANEL BY PCR
ADENOVIRUS-RVPPCR: NOT DETECTED
Bordetella pertussis: NOT DETECTED
CORONAVIRUS NL63-RVPPCR: NOT DETECTED
CORONAVIRUS OC43-RVPPCR: NOT DETECTED
Chlamydophila pneumoniae: NOT DETECTED
Coronavirus 229E: NOT DETECTED
Coronavirus HKU1: NOT DETECTED
INFLUENZA A-RVPPCR: NOT DETECTED
Influenza B: NOT DETECTED
MYCOPLASMA PNEUMONIAE-RVPPCR: NOT DETECTED
Metapneumovirus: NOT DETECTED
PARAINFLUENZA VIRUS 1-RVPPCR: NOT DETECTED
PARAINFLUENZA VIRUS 4-RVPPCR: NOT DETECTED
Parainfluenza Virus 2: NOT DETECTED
Parainfluenza Virus 3: NOT DETECTED
Respiratory Syncytial Virus: NOT DETECTED
Rhinovirus / Enterovirus: NOT DETECTED

## 2017-05-30 LAB — CBC
HEMATOCRIT: 34.8 % — AB (ref 36.0–46.0)
HEMOGLOBIN: 11.5 g/dL — AB (ref 12.0–15.0)
MCH: 29.9 pg (ref 26.0–34.0)
MCHC: 33 g/dL (ref 30.0–36.0)
MCV: 90.4 fL (ref 78.0–100.0)
Platelets: 127 10*3/uL — ABNORMAL LOW (ref 150–400)
RBC: 3.85 MIL/uL — ABNORMAL LOW (ref 3.87–5.11)
RDW: 14.1 % (ref 11.5–15.5)
WBC: 7.9 10*3/uL (ref 4.0–10.5)

## 2017-05-30 LAB — TROPONIN I: TROPONIN I: 0.88 ng/mL — AB (ref <0.03)

## 2017-05-30 LAB — HEPARIN LEVEL (UNFRACTIONATED): HEPARIN UNFRACTIONATED: 0.62 [IU]/mL (ref 0.30–0.70)

## 2017-05-30 LAB — MAGNESIUM: Magnesium: 1.9 mg/dL (ref 1.7–2.4)

## 2017-05-30 LAB — STREP PNEUMONIAE URINARY ANTIGEN: Strep Pneumo Urinary Antigen: NEGATIVE

## 2017-05-30 MED ORDER — POTASSIUM CHLORIDE 20 MEQ/15ML (10%) PO SOLN
30.0000 meq | ORAL | Status: AC
  Administered 2017-05-30: 30 meq
  Filled 2017-05-30: qty 30

## 2017-05-30 MED ORDER — ORAL CARE MOUTH RINSE
15.0000 mL | Freq: Two times a day (BID) | OROMUCOSAL | Status: DC
  Administered 2017-05-30 – 2017-06-03 (×8): 15 mL via OROMUCOSAL

## 2017-05-30 MED ORDER — HEPARIN (PORCINE) IN NACL 100-0.45 UNIT/ML-% IJ SOLN
800.0000 [IU]/h | INTRAMUSCULAR | Status: DC
  Administered 2017-05-30 – 2017-05-31 (×2): 700 [IU]/h via INTRAVENOUS
  Filled 2017-05-30 (×2): qty 250

## 2017-05-30 MED ORDER — HEPARIN BOLUS VIA INFUSION
3500.0000 [IU] | Freq: Once | INTRAVENOUS | Status: AC
  Administered 2017-05-30: 3500 [IU] via INTRAVENOUS
  Filled 2017-05-30: qty 3500

## 2017-05-30 NOTE — Consult Note (Signed)
Cardiology Consultation:   Patient ID: Samantha Dyer; [id number]; 1950-09-29   Admit date: 05/28/2017 Date of Consult: 05/30/2017  Primary Care Provider: No primary care provider on file. Primary Cardiologist: New to Dr. Gwenlyn Found   Patient Profile:   Samantha Dyer is a 66 y.o. female with a hx of COPD gold IV, chronic diastolic CHF and ongoing tobacco abuse who is being seen today for the evaluation of acute CHF and elevated troponin  at the request of Dr. Vaughan Browner.  No prior cardiac hx or MI. Father has hx of MI. Currently on ventilator support. Answer questions by nodding head.  History of Present Illness:   Samantha Dyer called EMS for dyspnea. Noted hypoxic at 25s and was cyanotic. She was treated with appropriate medication and intubated in ED. She was admitted for acute hypoxic respiratory failure with pulmonary edema, COPD exacerbation and possible CAP. Echo showed newly reduced LVEF of 40-45% (was 55-60% on 06/2016) with severe hypokinesis and increased thickness of atrial septum. BNP 630-->1600. Troponin 0.59-->0.84-->0.97-->0.88. Cardiology is asked for further management. She denies chest pain at baseline however admits to having orthopnea and DOE.   EKG:  The EKG was personally reviewed and demonstrates:  Sinus rhythm at rate of 111bpm, LVH and non specific TWI in lateral leads Telemetry:  Telemetry was personally reviewed and demonstrates:  Sinus tachycardia. Now rate improved to 90s.   Past Medical History:  Diagnosis Date  . COPD (chronic obstructive pulmonary disease) ([facility])     History reviewed. No pertinent surgical history.   Inpatient Medications: Scheduled Meds: . chlorhexidine gluconate (MEDLINE KIT)  15 mL Mouth Rinse BID  . feeding supplement (VITAL HIGH PROTEIN)  1,000 mL Per Tube Q24H  . furosemide  40 mg Intravenous Q12H  . heparin  5,000 Units Subcutaneous Q8H  . insulin aspart  2-6 Units Subcutaneous Q4H  . ipratropium-albuterol  3 mL Nebulization Q6H  .  mouth rinse  15 mL Mouth Rinse QID  . methylPREDNISolone (SOLU-MEDROL) injection  40 mg Intravenous Q12H  . potassium chloride  30 mEq Per Tube Q4H  . sodium chloride flush  3 mL Intravenous Q12H   Continuous Infusions: . sodium chloride    . famotidine (PEPCID) IV Stopped (05/30/17 0934)  . levofloxacin (LEVAQUIN) IV Stopped (05/30/17 2641)  . propofol (DIPRIVAN) infusion 35 mcg/kg/min (05/30/17 0900)   PRN Meds: sodium chloride, fentaNYL (SUBLIMAZE) injection, fentaNYL (SUBLIMAZE) injection, sodium chloride flush  Allergies:    Allergies  Allergen Reactions  . Augmentin [Amoxicillin-Pot Clavulanate] Swelling    Social History:   Social History   Social History  . Marital status: Single    Spouse name: N/A  . Number of children: N/A  . Years of education: N/A   Occupational History  . Not on file.   Social History Main Topics  . Smoking status: Current Every Day Smoker    Packs/day: 1.00    Types: Cigarettes  . Smokeless tobacco: Never Used  . Alcohol use No  . Drug use: No  . Sexual activity: Not on file   Other Topics Concern  . Not on file   Social History Narrative  . No narrative on file    Family History:  Father has hx of MI  ROS:  Please see the history of present illness.  ROS All other ROS reviewed and negative.     Physical Exam/Data:   Vitals:   05/30/17 0800 05/30/17 0812 05/30/17 0900 05/30/17 0943  BP: (!) 148/68 (!) 148/68 (!) 156/69 Marland Kitchen)  156/69  Pulse: 92 82  (!) 101  Resp: 19 (!) _0 Temp:      TempSrc:      SpO2: 98% 98%  97%  Weight:      Height:        Intake/Output Summary (Last 24 hours) at 05/30/17 1008 Last data filed at 05/30/17 0900  Gross per 24 hour  Intake          1162.55 ml  Output             2090 ml  Net          -927.45 ml   Filed Weights   05/29/17 0500 05/29/17 0600 05/30/17 0458  Weight: 138 lb 7.2 oz (62.8 kg) 138 lb 7.2 oz (62.8 kg) 131 lb 6.3 oz (59.6 kg)   Body mass index is 22.55 kg/m.    General:   in no acute distress however on ventilator support HEENT: normal Lymph: no adenopathy Neck: no JVD Endocrine:  No thryomegaly Vascular: No carotid bruits; FA pulses 2+ bilaterally without bruits  Cardiac:  normal S1, S2; RRR; no murmur Lungs:  clear to auscultation bilaterally, no wheezing, rhonchi or rales  Abd: soft, nontender, no hepatomegaly  Ext: trace BL LE edema Musculoskeletal:  No deformities, BUE and BLE strength normal and equal Skin: warm and dry  Neuro:  CNs 2-12 intact, no focal abnormalities noted Psych:  Normal affect   Relevant CV Studies: Echo 05/29/17 Study Conclusions  - Left ventricle: The cavity size was normal. There was mild focal   basal hypertrophy of the septum. Systolic function was mildly to   moderately reduced. The estimated ejection fraction was in the   range of 40% to 45%. There is severe hypokinesis of the   basal-midinferoseptal myocardium. There is akinesis of the   basal-midinferior myocardium. There is hypokinesis of the   basal-midanteroseptal myocardium. The study is not technically   sufficient to allow evaluation of LV diastolic function. - Aortic valve: Poorly visualized. - Mitral valve: Calcified annulus. Mildly thickened leaflets . - Left atrium: The atrium was moderately dilated. - Atrial septum: There was increased thickness of the septum,   consistent with lipomatous hypertrophy. - Pulmonic valve: There was trivial regurgitation.  Laboratory Data:  Chemistry Recent Labs Lab 05/29/17 0127 05/30/17 0425  NA 129* 130*  K 4.4 3.0*  CL 89* 90*  CO2 32 29  GLUCOSE 221* 137*  BUN 7 12  CREATININE 0.79 0.84  CALCIUM 8.4* 8.6*  GFRNONAA >60 >60  GFRAA >60 >60  ANIONGAP 8 11     Recent Labs Lab 05/29/17 0127  PROT 5.2*  ALBUMIN 3.3*  AST 26  ALT 13*  ALKPHOS 88  BILITOT 0.9   Hematology Recent Labs Lab 05/29/17 0127 05/30/17 0425  WBC 15.0* 7.9  RBC 4.00 3.85*  HGB 12.1 11.5*  HCT 37.5 34.8*   MCV 93.8 90.4  MCH 30.3 29.9  MCHC 32.3 33.0  RDW 14.0 14.1  PLT 165 127*   Cardiac Enzymes Recent Labs Lab 05/29/17 0549 05/29/17 0856 05/29/17 1429 05/30/17 0425  TROPONINI 0.59* 0.84* 0.97* 0.88*    Recent Labs Lab 05/29/17 0132  TROPIPOC 0.04    BNP Recent Labs Lab 05/29/17 0127 05/29/17 0530  BNP 629.9* 1,600.6*    DDimer No results for input(s): DDIMER in the last 168 hours.  Radiology/Studies:  Dg Chest Port 1 View  Result Date: 05/30/2017 CLINICAL DATA:  Shortness of breath, pulmonary edema, COPD, CHF. EXAM:  PORTABLE CHEST 1 VIEW COMPARISON:  Portable chest x-ray of May 29, 2017 FINDINGS: The lungs are well-expanded. The interstitial markings are mildly increased predominantly in the right upper and lower lobes. There is no pleural effusion. The heart is normal in size. The central pulmonary vascularity is prominent. There is calcification in the wall of the aortic arch. The endotracheal tube tip lies approximately 2.5 cm above the carina. The esophagogastric tube tip in proximal port project below the GE junction. IMPRESSION: COPD. Mild interstitial prominence greatest in the right upper lobe and right lower lobe may reflect pneumonia or asymmetric pulmonary edema. No alveolar infiltrate. Thoracic aortic atherosclerosis. The support tubes are in reasonable position. Electronically Signed   By: David  Martinique M.D.   On: 05/30/2017 07:21   Dg Chest Port 1 View  Result Date: 05/29/2017 CLINICAL DATA:  Respiratory failure, intubation, COPD, smoker EXAM: PORTABLE CHEST 1 VIEW COMPARISON:  Portable exam 0008 hours without priors for comparison FINDINGS: Tip of endotracheal tube projects 13 mm above carina. Proximal side-port of nasogastric tube is at or just above the gastroesophageal junction. Normal heart size mediastinal contours. Atherosclerotic calcification aorta. Pulmonary vascular congestion with mild infiltrates BILATERAL interstitial infiltrates favoring  pulmonary edema though infection not excluded. No pneumothorax. Bones demineralized. IMPRESSION: Scattered interstitial infiltrates favor edema over infection. Tube positions as above. Electronically Signed   By: Lavonia Dana M.D.   On: 05/29/2017 00:40    Assessment and Plan:   1. NSTEMI - Peak of troponin 0.97, now trending down. EKG with non specific changes. No chest pain. However has DOE and orthopnea. Cardiac risk factor includes tobacco smoking. Given new LV dysfunction, she will required L & R cath when extubated, will plan tentatively for Friday. Add ASA '81mg'$  qd, lipitor '40mg'$  and Coreg when able. Check lipid profile. Heparin per pharmacy.   2. Acute systolic heart failure - Echo showed newly reduced LVEF of 40-45% (was 55-60% on 06/2016) with severe hypokinesis and increased thickness of atrial septum. BNP 630-->1600. Continue diuresis. Net I & O negative 1.8L.   3. Hypokalemia - K of 3 today. Will give supplement.  4. Acute hypoxic respiratory failure with pulmonary edema, COPD exacerbation and possible CAP.  - Per primary team  For questions or updates, please contact [facility] Please consult [email or web address] for contact info under Cardiology/STEMI.   Signed, [place], PA  05/30/2017 10:08 AM   Agree with note by Robbie Lis PA-C  We were asked to see Samantha Dyer because of positive enzymes and an abnormal 2-D echo. She was admitted 2 days ago with respiratory failure thought to be related to COPD exacerbation with ongoing tobacco abuse. She has had multiple admissions similar to this in the past with normal 2-D echoes. She is currently intubated but alert. Her troponins peaked at 0.9. Her LVEF is reduced to be 40-45% range with segmental wall motion analysis new since her prior echoes. Her BNP has increased from [phone] and she is being diuresed with a net negative IM nail of 1.8 L. Her lungs are clear on exam. She has no peripheral edema. EKG shows LVH with  repolarization changes. I suspect she has diffuse ischemic heart disease given her long history of tobacco abuse. She will require right left heart cath after she is extubated. We will continue to follow along with you.   Lorretta Harp, M.D., [place], [facility], [place] [address] [place], [place]  [id number]  [phone] 05/30/2017 10:41 AM

## 2017-05-30 NOTE — Progress Notes (Signed)
[facility] ADULT ICU REPLACEMENT PROTOCOL FOR AM LAB REPLACEMENT ONLY  The patient does apply for the [facility] Adult ICU Electrolyte Replacment Protocol based on the criteria listed below:   1. Is GFR >/= 40 ml/min? Yes.    Patient's GFR today is >60 2. Is urine output >/= 0.5 ml/kg/hr for the last 6 hours? Yes.   Patient's UOP is 1.8 ml/kg/hr 3. Is BUN < 60 mg/dL? Yes.    Patient's BUN today is 12 4. Abnormal electrolyte(s): 3.0 5. Ordered repletion with: per protocol 6. If a panic level lab has been reported, has the CCM MD in charge been notified? Yes.  .   Physician:  Dr. Grant Fontana, [place] Boos 05/30/2017 6:11 AM

## 2017-05-30 NOTE — Procedures (Signed)
Extubation Procedure Note  Patient Details:   Name: Samantha Dyer DOB: 1951-07-10 MRN: [id number]   Airway Documentation:     Evaluation  O2 sats: stable throughout Complications: No apparent complications Patient did tolerate procedure well. Bilateral Breath Sounds: Clear, Diminished   Yes   Positive cuff leak noted.  Pt placed on [place] 4 L with humidity, no stridor noted. Pt able to reach 1250 mL using incentive spirometer.  Forest Becker Gaylin Osoria 05/30/2017, 11:09 AM

## 2017-05-30 NOTE — Progress Notes (Signed)
PULMONARY / CRITICAL CARE MEDICINE   Name: Samantha Dyer MRN: [id number] DOB: [birth year]    ADMISSION DATE:  05/28/2017 CONSULTATION DATE:  05/29/2017  REFERRING MD:  Dr. Blinda Leatherwood  CHIEF COMPLAINT:  Respiratory failure  HISTORY OF PRESENT ILLNESS:  Patient is encephalopathic and/or intubated. Therefore history has been obtained from chart review. 66 year old female with history of COPD gold IV and diastolic heart failure. On 2L O2 baseline. She has been admitted and intubated in the past. According to clinic notes she continues to smoke. She called EMS in the late PM hours of 10/8 for dyspnea. Upon their arrival she was found to have O2 sats in the 50s and was cyanotic. She was treated with duoneb, magnesium, solumedrol and CPAP and only improved to O2 sats in 70s. She was intubated in ED. No wheeze on presentation, but did sound "wet" per RN. CXR consistent with pulmonary edema. Lasix was ordered. PCCM asked to admit on 05/29/2017.    REVIEW OF SYSTEMS:   Unable as patient is intubated  SUBJECTIVE:  No acute events overnight.  Patient resting comfortably.   VITAL SIGNS: BP (!) 148/68   Pulse 82   Temp 97.6 F (36.4 C) (Oral)   Resp (!) 24   Ht  (1.626 m)   Wt 131 lb 6.3 oz (59.6 kg)   SpO2 98%   BMI 22.55 kg/m   HEMODYNAMICS:    VENTILATOR SETTINGS: Vent Mode: PSV;CPAP FiO2 (%):  [30 %] 30 % Set Rate:  [24 bmp] 24 bmp Vt Set:  [470 mL] 470 mL PEEP:  [5 cmH20] 5 cmH20 Pressure Support:  [10 cmH20] 10 cmH20 Plateau Pressure:  [17 cmH20-19 cmH20] 19 cmH20  INTAKE / OUTPUT: I/O last 3 completed shifts: In: 1304.4 [I.V.:431.4; NG/GT:573; IV Piggyback:300] Out: 2840 [Urine:2040; Emesis/NG output:800]  PHYSICAL EXAMINATION: Gen:      66 yo F, on vent, no acute distress HEENT:  EOMI, PERRL, sclera anicteric Neck:     Supple, ETT in place  Lungs:    CTAB; normal respiratory effort CV:         RRR no MRG noted  Abd:      soft, non-tender; no distention, +bs  Ext:    No  edema or tenderness  Skin:      Warm and dry; no rash Neuro:  Awake, alert, able to follow commands, wiggles toes   LABS: Na 130 K+ 3.0  Mag 1.9 Phos 3.3 Trop 0.59>0.84>0.97>0.88  BMET  Recent Labs Lab 05/29/17 0127 05/30/17 0425  NA 129* 130*  K 4.4 3.0*  CL 89* 90*  CO2 32 29  BUN 7 12  CREATININE 0.79 0.84  GLUCOSE 221* 137*   Electrolytes  Recent Labs Lab 05/29/17 0127 05/29/17 0549 05/30/17 0425  CALCIUM 8.4*  --  8.6*  MG  --  2.2 1.9  PHOS  --  2.5 3.3   CBC  Recent Labs Lab 05/29/17 0127 05/30/17 0425  WBC 15.0* 7.9  HGB 12.1 11.5*  HCT 37.5 34.8*  PLT 165 127*   Coag's No results for input(s): APTT, INR in the last 168 hours.  Sepsis Markers  Recent Labs Lab 05/29/17 0134 05/29/17 0549  LATICACIDVEN 1.65  --   PROCALCITON  --  0.22   ABG  Recent Labs Lab 05/29/17 0055 05/29/17 0157  PHART 7.164* 7.361  PCO2ART 98.5* 59.2*  PO2ART 151* 130.0*   Liver Enzymes  Recent Labs Lab 05/29/17 0127  AST 26  ALT 13*  ALKPHOS  88  BILITOT 0.9  ALBUMIN 3.3*   Cardiac Enzymes  Recent Labs Lab 05/29/17 0856 05/29/17 1429 05/30/17 0425  TROPONINI 0.84* 0.97* 0.88*   Glucose  Recent Labs Lab 05/29/17 0510 05/29/17 1119 05/29/17 1511 05/29/17 1939 05/30/17 0005 05/30/17 0342  GLUCAP 155* 127* 108* 132* 153* 127*   Imaging Dg Chest Port 1 View  Result Date: 05/30/2017 CLINICAL DATA:  Shortness of breath, pulmonary edema, COPD, CHF. EXAM: PORTABLE CHEST 1 VIEW COMPARISON:  Portable chest x-ray of May 29, 2017 FINDINGS: The lungs are well-expanded. The interstitial markings are mildly increased predominantly in the right upper and lower lobes. There is no pleural effusion. The heart is normal in size. The central pulmonary vascularity is prominent. There is calcification in the wall of the aortic arch. The endotracheal tube tip lies approximately 2.5 cm above the carina. The esophagogastric tube tip in proximal port project  below the GE junction. IMPRESSION: COPD. Mild interstitial prominence greatest in the right upper lobe and right lower lobe may reflect pneumonia or asymmetric pulmonary edema. No alveolar infiltrate. Thoracic aortic atherosclerosis. The support tubes are in reasonable position. Electronically Signed   By: David  [place] M.D.   On: 05/30/2017 07:21   STUDIES:  -CXR 10/10 - support tubes in good position; COPD with mild interstitial prominence greatest in RUL and RLL may reflect PNA or asymmetric pulmonary edema, no alveolar infiltrate  -Echo 10/9 >> EF 40-45%   CULTURES: Bcx 10/9 >>   ANTIBIOTICS: Levaquin 10/9 >>  SIGNIFICANT EVENTS: 10/8: ETT  10/9: Admit  LINES/TUBES: ETT 10/8>>> NG/OG 10/9 >> PIV L antecub 10/8 PIV L forearm 10/9 PIV R forearm 10/9  DISCUSSION: 66 year old female with known severe COPD on home O2, and known diastolic CHF admitted for mixed respiratory failure. Most likely due to pulmonary edema in the setting of decompensated heart failure, but the differential also includes COPD exacerbation and less likely community-acquired pneumonia. Plan to treat with diuresis, mechanical ventilation, nebulized bronchodilators, intravenous steroids, and Antibiotics.  ASSESSMENT / PLAN:  PULMONARY Acute on chronic hypoxemic/hypercarbic respiratory failure Pulmonary edema Possible acute exacerbation of COPD Possible CAP Continue full mechanical vent support SBTs as tolerated Continue nebs and solumedrol 40 mg IV Q12  CARDIOVASCULAR Acute on chronic diastolic heart failure Elevated troponins x3 2D Echo with EF 40-45%.   Consider cardiology consult  RENAL Hyponatremia Lasix 40 mg IV q12 Follow daily BMP Replete lytes  GASTROINTESTINAL History of GERD Pepcid Nothing by mouth  HEMATOLOGIC No acute issues Subcutaneous heparin Stable Hgb 12.1 Follow CBC  INFECTIOUS Rule out pneumonia given SIRS criteria and chest x-ray changes Continue Levaquin F/u Blood  cultures F/u Sputum cultures Procal wnl 0.22  Low threshold to deescalate abx  ENDOCRINE Hyperglycemia with no history of diabetes mellitus Blood glucose monitoring and sliding scale insulin CBG range 120-150  NEUROLOGIC Acute metabolic encephalopathy Propofol infusion for sedation PRN fentanyl for analgesia. RASS goal: -1 to -2  FAMILY  - Updates: No family available - Inter-disciplinary family meet or Palliative Care meeting due by:  10/16  The patient is critically ill with multiple organ system failure and requires high complexity decision making for assessment and support, frequent evaluation and titration of therapies, advanced monitoring, review of radiographic studies and interpretation of complex data.   Additional critical Care Time devoted to patient care services, exclusive of separately billable procedures, described in this note is 35 minutes.   Freddrick March, MD [place] PGY-2  05/30/2017

## 2017-05-30 NOTE — Progress Notes (Signed)
ANTICOAGULATION CONSULT NOTE  Pharmacy Consult for Heparin Indication: NSTEMI/planned Cath  Allergies  Allergen Reactions  . Augmentin [Amoxicillin-Pot Clavulanate] Swelling    Patient Measurements: Height:  (162.6 cm) Weight: 131 lb 6.3 oz (59.6 kg) IBW/kg (Calculated) : 54.7 Heparin Dosing Weight: 59.6 kg  Vital Signs: Temp: 98.6 F (37 C) (10/10 1539) Temp Source: Oral (10/10 1539) BP: 127/57 (10/10 1900) Pulse Rate: 102 (10/10 1900)  Labs:  Recent Labs  05/29/17 0127  05/29/17 0856 05/29/17 1429 05/30/17 0425 05/30/17 1348 05/30/17 1929  HGB 12.1  --   --   --  11.5*  --   --   HCT 37.5  --   --   --  34.8*  --   --   PLT 165  --   --   --  127*  --   --   HEPARINUNFRC  --   --   --   --   --   --  0.62  CREATININE 0.79  --   --   --  0.84 0.91  --   TROPONINI  --   < > 0.84* 0.97* 0.88*  --   --   < > = values in this interval not displayed.  Estimated Creatinine Clearance: 53.2 mL/min (by C-G formula based on SCr of 0.91 mg/dL).   Medical History: Past Medical History:  Diagnosis Date  . COPD (chronic obstructive pulmonary disease) (HCC)     Medications:  Infusions:  . sodium chloride    . famotidine (PEPCID) IV Stopped (05/30/17 0934)  . heparin 700 Units/hr (05/30/17 1323)  . levofloxacin [facility]) IV Stopped (05/30/17 1610)    Assessment: 66 yo female with NSTEMMI on heparin. Plans noted for cath this Friday (10/12). -Initial heparin level is at gol  Goal of Therapy:  INR 2-3 Monitor platelets by anticoagulation protocol: Yes   Plan:  -No heparin changed needed -Daily heparin level and CBC  Harland German, Pharm D 05/30/2017 8:32 PM

## 2017-05-30 NOTE — Progress Notes (Signed)
PULMONARY / CRITICAL CARE MEDICINE   Name: Samantha Dyer MRN: [id number] DOB: [birth year]    ADMISSION DATE:  05/28/2017 CONSULTATION DATE:  05/29/2017  REFERRING MD:  Dr. Blinda Leatherwood  CHIEF COMPLAINT:  Respiratory failure  HISTORY OF PRESENT ILLNESS:  Patient is encephalopathic and/or intubated. Therefore history has been obtained from chart review. 66 year old female with history of COPD gold IV and diastolic heart failure. On 2L O2 baseline. She has been admitted and intubated in the past. According to clinic notes she continues to smoke. She called EMS in the late PM hours of 10/8 for dyspnea. Upon their arrival she was found to have O2 sats in the 50s and was cyanotic. She was treated with duoneb, magnesium, solumedrol and CPAP and only improved to O2 sats in 70s. She was intubated in ED. No wheeze on presentation, but did sound "wet" per RN. CXR consistent with pulmonary edema. Lasix was ordered. PCCM asked to admit.   PAST MEDICAL HISTORY : chart merge pending see above.  She  has a past medical history of COPD (chronic obstructive pulmonary disease) (HCC).  PAST SURGICAL HISTORY: chart merge pending see above She  has no past surgical history on file.  Allergies  Allergen Reactions  . Augmentin [Amoxicillin-Pot Clavulanate] Swelling    No current facility-administered medications on file prior to encounter.    No current outpatient prescriptions on file prior to encounter.    FAMILY HISTORY:  Her has no family status information on file.    SOCIAL HISTORY: She  reports that she has been smoking Cigarettes.  She has been smoking about 1.00 pack per day. She has never used smokeless tobacco. She reports that she does not drink alcohol or use drugs.  REVIEW OF SYSTEMS:   Unable as patient is encephalopathic and intubated  SUBJECTIVE:  No issues, continues on vent  VITAL SIGNS: BP (!) 156/69   Pulse 82   Temp 97.6 F (36.4 C) (Oral)   Resp 16   Ht  (1.626 m)   Wt  131 lb 6.3 oz (59.6 kg)   SpO2 98%   BMI 22.55 kg/m   HEMODYNAMICS:    VENTILATOR SETTINGS: Vent Mode: PSV;CPAP FiO2 (%):  [30 %] 30 % Set Rate:  [24 bmp] 24 bmp Vt Set:  [470 mL] 470 mL PEEP:  [5 cmH20] 5 cmH20 Pressure Support:  [10 cmH20] 10 cmH20 Plateau Pressure:  [17 cmH20-19 cmH20] 19 cmH20  INTAKE / OUTPUT: I/O last 3 completed shifts: In: 1304.4 [I.V.:431.4; NG/GT:573; IV Piggyback:300] Out: 2840 [Urine:2040; Emesis/NG output:800]  PHYSICAL EXAMINATION: Gen:      No acute distress HEENT:  EOMI, sclera anicteric, ETT Neck:     No masses; no thyromegaly Lungs:    Clear to auscultation bilaterally; normal respiratory effort CV:         Regular rate and rhythm; no murmurs Abd:      + bowel sounds; soft, non-tender; no palpable masses, no distension Ext:    No edema; adequate peripheral perfusion Skin:      Warm and dry; no rash Neuro: Sedated, awakens to command  LABS:  BMET  Recent Labs Lab 05/29/17 0127 05/30/17 0425  NA 129* 130*  K 4.4 3.0*  CL 89* 90*  CO2 32 29  BUN 7 12  CREATININE 0.79 0.84  GLUCOSE 221* 137*    Electrolytes  Recent Labs Lab 05/29/17 0127 05/29/17 0549 05/30/17 0425  CALCIUM 8.4*  --  8.6*  MG  --  2.2  1.9  PHOS  --  2.5 3.3    CBC  Recent Labs Lab 05/29/17 0127 05/30/17 0425  WBC 15.0* 7.9  HGB 12.1 11.5*  HCT 37.5 34.8*  PLT 165 127*    Coag's No results for input(s): APTT, INR in the last 168 hours.  Sepsis Markers  Recent Labs Lab 05/29/17 0134 05/29/17 0549  LATICACIDVEN 1.65  --   PROCALCITON  --  0.22    ABG  Recent Labs Lab 05/29/17 0055 05/29/17 0157  PHART 7.164* 7.361  PCO2ART 98.5* 59.2*  PO2ART 151* 130.0*    Liver Enzymes  Recent Labs Lab 05/29/17 0127  AST 26  ALT 13*  ALKPHOS 88  BILITOT 0.9  ALBUMIN 3.3*    Cardiac Enzymes  Recent Labs Lab 05/29/17 0856 05/29/17 1429 05/30/17 0425  TROPONINI 0.84* 0.97* 0.88*    Glucose  Recent Labs Lab  05/29/17 0510 05/29/17 1119 05/29/17 1511 05/29/17 1939 05/30/17 0005 05/30/17 0342  GLUCAP 155* 127* 108* 132* 153* 127*    Imaging Dg Chest Port 1 View  Result Date: 05/30/2017 CLINICAL DATA:  Shortness of breath, pulmonary edema, COPD, CHF. EXAM: PORTABLE CHEST 1 VIEW COMPARISON:  Portable chest x-ray of May 29, 2017 FINDINGS: The lungs are well-expanded. The interstitial markings are mildly increased predominantly in the right upper and lower lobes. There is no pleural effusion. The heart is normal in size. The central pulmonary vascularity is prominent. There is calcification in the wall of the aortic arch. The endotracheal tube tip lies approximately 2.5 cm above the carina. The esophagogastric tube tip in proximal port project below the GE junction. IMPRESSION: COPD. Mild interstitial prominence greatest in the right upper lobe and right lower lobe may reflect pneumonia or asymmetric pulmonary edema. No alveolar infiltrate. Thoracic aortic atherosclerosis. The support tubes are in reasonable position. Electronically Signed   By: David  [place] M.D.   On: 05/30/2017 07:21    STUDIES:   CULTURES:  ANTIBIOTICS: Levaquin 10/9>>  SIGNIFICANT EVENTS: 10/9: Admit  LINES/TUBES: ETT 10/8>>>  DISCUSSION: 66 year old female with known severe COPD on home O2, and known diastolic CHF admitted for mixed respiratory failure. Most likely due to pulmonary edema in the setting of decompensated heart failure, but the differential also includes COPD exacerbation and less likely community-acquired pneumonia. Plan to treat with diuresis, mechanical ventilation, nebulized bronchodilators, intravenous steroids, and antibiotics.  ASSESSMENT / PLAN:  PULMONARY A: Acute on chronic hypoxemic/hypercarbic respiratory failure Pulmonary edema Possible acute exacerbation of COPD Possible CAP  P:   Continue vent support Push SBTs Continue nebs, solumedrol  CARDIOVASCULAR A:  Acute on chronic  diastolic heart failure Elevated troponin. P:  Continue to trend troponins. Echo shows reduction in EF with new wall motion abnormalities. Will consult cardiology  RENAL A:   Hyponatremia  P:   Continue lasix for diuresis Follow BMP Replete lytes  GASTROINTESTINAL A:   History of GERD P:   Pepcid Nothing by mouth  HEMATOLOGIC A:   No acute issues P:  Subcutaneous heparin Follow CBC  INFECTIOUS A:   CAP P:   Levaquin Blood cultures Sputum cultures Check urine legionella, pneumococcus, RVP  ENDOCRINE A:   Hyperglycemia with no history of diabetes mellitus  P:   SSI coverage  NEUROLOGIC A:   Acute metabolic encephalopathy P:   RASS goal: -1 to -2 Propofol infusion for sedation PRN fentanyl for analgesia.  FAMILY  - Updates: No family available - Inter-disciplinary family meet or Palliative Care meeting due by:  10/16  The patient is critically ill with multiple organ system failure and requires high complexity decision making for assessment and support, frequent evaluation and titration of therapies, advanced monitoring, review of radiographic studies and interpretation of complex data.   Additional critical Care Time devoted to patient care services, exclusive of separately billable procedures, described in this note is 35 minutes.   Chilton Greathouse MD [place] Pager [phone] If no answer or after 3pm call: [phone] 05/30/2017, 9:26 AM

## 2017-05-30 NOTE — Progress Notes (Signed)
ANTICOAGULATION CONSULT NOTE - Initial Consult  Pharmacy Consult for Heparin Indication: NSTEMI/planned Cath  Allergies  Allergen Reactions  . Augmentin [Amoxicillin-Pot Clavulanate] Swelling    Patient Measurements: Height:  (162.6 cm) Weight: 131 lb 6.3 oz (59.6 kg) IBW/kg (Calculated) : 54.7 Heparin Dosing Weight: 59.6 kg  Vital Signs: Temp: 97.6 F (36.4 C) (10/10 0758) Temp Source: Oral (10/10 0758) BP: 156/69 (10/10 0943) Pulse Rate: 101 (10/10 0943)  Labs:  Recent Labs  05/29/17 0127  05/29/17 0856 05/29/17 1429 05/30/17 0425  HGB 12.1  --   --   --  11.5*  HCT 37.5  --   --   --  34.8*  PLT 165  --   --   --  127*  CREATININE 0.79  --   --   --  0.84  TROPONINI  --   < > 0.84* 0.97* 0.88*  < > = values in this interval not displayed.  Estimated Creatinine Clearance: 57.7 mL/min (by C-G formula based on SCr of 0.84 mg/dL).   Medical History: Past Medical History:  Diagnosis Date  . COPD (chronic obstructive pulmonary disease) (HCC)     Medications:  Infusions:  . sodium chloride    . famotidine (PEPCID) IV Stopped (05/30/17 0934)  . levofloxacin (LEVAQUIN) IV Stopped (05/30/17 1610)  . propofol (DIPRIVAN) infusion 35 mcg/kg/min (05/30/17 0900)    Assessment: 66 yo female with diastolic HF and COPD presents with respiratory distress likely 2/2 CAP and COPD exacerbation. Troponin peaked at 0.97, now trending down. Per cardiology, EKG showed non specific changes, she denies CP, but has DOE and orthopnea. Cardiac risk factor includes tobacco smoking. ECHO showed new LV dysfunction, so she is tentatively scheduled for L & R heart cath this Friday (10/12).  Hgb 11.5, plts 127 (decreased from 165 on admit), no bleeding noted.   Goal of Therapy:  INR 2-3 Monitor platelets by anticoagulation protocol: Yes   Plan:  Heparin IV 3500 unit bolus x 1 Heparin IV 700 units/hour 6-hour heparin level Daily heparin level and CBC  Adline Potter,  PharmD PGY1 Acute Care Pharmacy Resident Pager: [phone]

## 2017-05-31 DIAGNOSIS — I5023 Acute on chronic systolic (congestive) heart failure: Secondary | ICD-10-CM

## 2017-05-31 DIAGNOSIS — I519 Heart disease, unspecified: Secondary | ICD-10-CM

## 2017-05-31 LAB — GLUCOSE, CAPILLARY
GLUCOSE-CAPILLARY: 120 mg/dL — AB (ref 65–99)
GLUCOSE-CAPILLARY: 143 mg/dL — AB (ref 65–99)
GLUCOSE-CAPILLARY: 128 mg/dL — AB (ref 65–99)
Glucose-Capillary: 136 mg/dL — ABNORMAL HIGH (ref 65–99)
Glucose-Capillary: 83 mg/dL (ref 65–99)
Glucose-Capillary: 151 mg/dL — ABNORMAL HIGH (ref 65–99)
Glucose-Capillary: 106 mg/dL — ABNORMAL HIGH (ref 65–99)

## 2017-05-31 LAB — BASIC METABOLIC PANEL
ANION GAP: 8 (ref 5–15)
BUN: 16 mg/dL (ref 6–20)
CALCIUM: 8.7 mg/dL — AB (ref 8.9–10.3)
CO2: 36 mmol/L — ABNORMAL HIGH (ref 22–32)
Chloride: 89 mmol/L — ABNORMAL LOW (ref 101–111)
Creatinine, Ser: 0.79 mg/dL (ref 0.44–1.00)
Glucose, Bld: 107 mg/dL — ABNORMAL HIGH (ref 65–99)
Potassium: 3.8 mmol/L (ref 3.5–5.1)
Sodium: 133 mmol/L — ABNORMAL LOW (ref 135–145)

## 2017-05-31 LAB — LIPID PANEL
CHOL/HDL RATIO: 3.1 ratio
Cholesterol: 196 mg/dL (ref 0–200)
HDL: 63 mg/dL (ref >40)
LDL Cholesterol: 116 mg/dL — ABNORMAL HIGH (ref 0–99)
Triglycerides: 84 mg/dL (ref <150)
VLDL: 17 mg/dL (ref 0–40)

## 2017-05-31 LAB — CBC
HEMATOCRIT: 37.3 % (ref 36.0–46.0)
Hemoglobin: 12.1 g/dL (ref 12.0–15.0)
MCH: 30.1 pg (ref 26.0–34.0)
MCHC: 32.4 g/dL (ref 30.0–36.0)
MCV: 92.8 fL (ref 78.0–100.0)
Platelets: 132 10*3/uL — ABNORMAL LOW (ref 150–400)
RBC: 4.02 MIL/uL (ref 3.87–5.11)
RDW: 14.7 % (ref 11.5–15.5)
WBC: 12.2 10*3/uL — AB (ref 4.0–10.5)

## 2017-05-31 LAB — LEGIONELLA PNEUMOPHILA SEROGP 1 UR AG: L. PNEUMOPHILA SEROGP 1 UR AG: NEGATIVE

## 2017-05-31 LAB — HEPARIN LEVEL (UNFRACTIONATED): Heparin Unfractionated: 0.42 IU/mL (ref 0.30–0.70)

## 2017-05-31 MED ORDER — SODIUM CHLORIDE 0.9% FLUSH: 3.0000 mL | INTRAVENOUS | Status: DC | PRN

## 2017-05-31 MED ORDER — SODIUM CHLORIDE 0.9 % IV SOLN: 250.0000 mL | INTRAVENOUS | Status: DC | PRN

## 2017-05-31 MED ORDER — PANTOPRAZOLE SODIUM 40 MG PO TBEC
40.0000 mg | DELAYED_RELEASE_TABLET | Freq: Every day | ORAL | Status: DC
  Administered 2017-05-31 – 2017-06-01 (×2): 40 mg via ORAL
  Filled 2017-05-31 (×2): qty 1

## 2017-05-31 MED ORDER — FUROSEMIDE 10 MG/ML IJ SOLN
40.0000 mg | Freq: Every day | INTRAMUSCULAR | Status: DC
  Administered 2017-06-01 – 2017-06-02 (×2): 40 mg via INTRAVENOUS
  Filled 2017-05-31 (×3): qty 4

## 2017-05-31 MED ORDER — ATORVASTATIN CALCIUM 40 MG PO TABS
40.0000 mg | ORAL_TABLET | Freq: Every day | ORAL | Status: DC
  Administered 2017-05-31 – 2017-06-01 (×2): 40 mg via ORAL
  Filled 2017-05-31 (×2): qty 1

## 2017-05-31 MED ORDER — ASPIRIN 81 MG PO CHEW
81.0000 mg | CHEWABLE_TABLET | ORAL | Status: AC
  Administered 2017-06-01: 81 mg via ORAL
  Filled 2017-05-31: qty 1

## 2017-05-31 MED ORDER — SODIUM CHLORIDE 0.9 % IV SOLN: INTRAVENOUS | Status: DC

## 2017-05-31 MED ORDER — SODIUM CHLORIDE 0.9% FLUSH
3.0000 mL | Freq: Two times a day (BID) | INTRAVENOUS | Status: DC
  Administered 2017-05-31 (×2): 3 mL via INTRAVENOUS

## 2017-05-31 MED ORDER — SODIUM CHLORIDE 0.9 % IV SOLN
INTRAVENOUS | Status: DC
  Administered 2017-06-01: 250 mL via INTRAVENOUS
  Administered 2017-06-01: 07:00:00 via INTRAVENOUS

## 2017-05-31 MED ORDER — SODIUM CHLORIDE 0.9% FLUSH
3.0000 mL | Freq: Two times a day (BID) | INTRAVENOUS | Status: DC
  Administered 2017-06-01: 3 mL via INTRAVENOUS

## 2017-05-31 MED ORDER — ASPIRIN 81 MG PO CHEW
81.0000 mg | CHEWABLE_TABLET | ORAL | Status: AC
  Administered 2017-05-31: 81 mg via ORAL
  Filled 2017-05-31: qty 1

## 2017-05-31 MED ORDER — PREDNISONE 50 MG PO TABS
60.0000 mg | ORAL_TABLET | Freq: Every day | ORAL | Status: DC
  Administered 2017-06-01: 09:00:00 60 mg via ORAL
  Filled 2017-05-31 (×2): qty 1

## 2017-05-31 MED ORDER — ASPIRIN 81 MG PO CHEW
81.0000 mg | CHEWABLE_TABLET | Freq: Every day | ORAL | Status: DC
  Administered 2017-05-31 – 2017-06-05 (×5): 81 mg via ORAL
  Filled 2017-05-31 (×6): qty 1

## 2017-05-31 MED ORDER — LEVOFLOXACIN 750 MG PO TABS
750.0000 mg | ORAL_TABLET | Freq: Every day | ORAL | Status: DC
  Administered 2017-06-01: 750 mg via ORAL
  Filled 2017-05-31: qty 1

## 2017-05-31 MED ORDER — LEVOFLOXACIN 750 MG PO TABS
750.0000 mg | ORAL_TABLET | Freq: Every day | ORAL | Status: DC
  Filled 2017-05-31: qty 1

## 2017-05-31 MED ORDER — CARVEDILOL 6.25 MG PO TABS
6.2500 mg | ORAL_TABLET | Freq: Two times a day (BID) | ORAL | Status: DC
  Administered 2017-05-31 – 2017-06-02 (×3): 6.25 mg via ORAL
  Filled 2017-05-31 (×5): qty 1

## 2017-05-31 NOTE — Progress Notes (Signed)
Progress Note  Patient Name: Samantha Dyer Date of Encounter: 05/31/2017  Primary Cardiologist: [place]   Patient was successfully extubated yesterday. She is breathing comfortably sitting in a chair with nasal oxygen. Plan for right and left heart cath tomorrow  Inpatient Medications    Scheduled Meds: . aspirin  81 mg Oral Daily  . atorvastatin  40 mg Oral q1800  . carvedilol  6.25 mg Oral BID WC  . [START ON 06/01/2017] furosemide  40 mg Intravenous Daily  . ipratropium-albuterol  3 mL Nebulization Q6H  . [START ON 06/01/2017] levofloxacin  750 mg Oral Daily  . mouth rinse  15 mL Mouth Rinse BID  . pantoprazole  40 mg Oral QHS  . [START ON 06/01/2017] predniSONE  60 mg Oral Q breakfast  . sodium chloride flush  3 mL Intravenous Q12H  . sodium chloride flush  3 mL Intravenous Q12H   Continuous Infusions: . sodium chloride    . sodium chloride    . [START ON 06/01/2017] sodium chloride    . heparin 700 Units/hr (05/31/17 0700)   PRN Meds: sodium chloride, sodium chloride, fentaNYL (SUBLIMAZE) injection, fentaNYL (SUBLIMAZE) injection, sodium chloride flush, sodium chloride flush   Vital Signs    Vitals:   05/31/17 0800 05/31/17 0824 05/31/17 0900 05/31/17 1000  BP: (!) 125/58 (!) 125/58 (!) 126/58 (!) 108/48  Pulse: 90 88 89 95  Resp: '20 19 17 14  '$ Temp:      TempSrc:      SpO2: 96% 97% 95% 92%  Weight:      Height:        Intake/Output Summary (Last 24 hours) at 05/31/17 1110 Last data filed at 05/31/17 0800  Gross per 24 hour  Intake           734.32 ml  Output             5540 ml  Net         -4805.68 ml   Filed Weights   05/29/17 0600 05/30/17 0458 05/31/17 0459  Weight: 138 lb 7.2 oz (62.8 kg) 131 lb 6.3 oz (59.6 kg) 120 lb 5.9 oz (54.6 kg)    Telemetry    Normal sinus rhythm in the 90s - Personally Reviewed  ECG    Not performed today - Personally Reviewed  Physical Exam   GEN: No acute distress.   Neck: No JVD Cardiac: RRR,  no murmurs, rubs, or gallops.  Respiratory: Clear to auscultation bilaterally.Successfully extubated yesterday GI: Soft, nontender, non-distended  MS: No edema; No deformity. Neuro:  Nonfocal  Psych: Normal affect   Labs    Chemistry Recent Labs Lab 05/29/17 0127 05/30/17 0425 05/30/17 1348 05/31/17 0346  NA 129* 130* 132* 133*  K 4.4 3.0* 4.4 3.8  CL 89* 90* 90* 89*  CO2 32 29 34* 36*  GLUCOSE 221* 137* 121* 107*  BUN 7 12 21* 16  CREATININE 0.79 0.84 0.91 0.79  CALCIUM 8.4* 8.6* 8.5* 8.7*  PROT 5.2*  --   --   --   ALBUMIN 3.3*  --   --   --   AST 26  --   --   --   ALT 13*  --   --   --   ALKPHOS 88  --   --   --   BILITOT 0.9  --   --   --   GFRNONAA >60 >60 >60 >60  GFRAA >60 >60 >60 >60  ANIONGAP 8  $'11 8 8     'n$ Hematology Recent Labs Lab 05/29/17 0127 05/30/17 0425 05/31/17 0346  WBC 15.0* 7.9 12.2*  RBC 4.00 3.85* 4.02  HGB 12.1 11.5* 12.1  HCT 37.5 34.8* 37.3  MCV 93.8 90.4 92.8  MCH 30.3 29.9 30.1  MCHC 32.3 33.0 32.4  RDW 14.0 14.1 14.7  PLT 165 127* 132*    Cardiac Enzymes Recent Labs Lab 05/29/17 0549 05/29/17 0856 05/29/17 1429 05/30/17 0425  TROPONINI 0.59* 0.84* 0.97* 0.88*    Recent Labs Lab 05/29/17 0132  TROPIPOC 0.04     BNP Recent Labs Lab 05/29/17 0127 05/29/17 0530  BNP 629.9* 1,600.6*     DDimer No results for input(s): DDIMER in the last 168 hours.   Radiology    Dg Chest Port 1 View  Result Date: 05/30/2017 CLINICAL DATA:  Shortness of breath, pulmonary edema, COPD, CHF. EXAM: PORTABLE CHEST 1 VIEW COMPARISON:  Portable chest x-ray of May 29, 2017 FINDINGS: The lungs are well-expanded. The interstitial markings are mildly increased predominantly in the right upper and lower lobes. There is no pleural effusion. The heart is normal in size. The central pulmonary vascularity is prominent. There is calcification in the wall of the aortic arch. The endotracheal tube tip lies approximately 2.5 cm above the carina.  The esophagogastric tube tip in proximal port project below the GE junction. IMPRESSION: COPD. Mild interstitial prominence greatest in the right upper lobe and right lower lobe may reflect pneumonia or asymmetric pulmonary edema. No alveolar infiltrate. Thoracic aortic atherosclerosis. The support tubes are in reasonable position. Electronically Signed   By: David  [place]ique M.D.   On: 05/30/2017 07:21    Cardiac Studies   2-D echocardiogram (05/29/17)  Study Conclusions  - Left ventricle: The cavity size was normal. There was mild focal   basal hypertrophy of the septum. Systolic function was mildly to   moderately reduced. The estimated ejection fraction was in the   range of 40% to 45%. There is severe hypokinesis of the   basal-midinferoseptal myocardium. There is akinesis of the   basal-midinferior myocardium. There is hypokinesis of the   basal-midanteroseptal myocardium. The study is not technically   sufficient to allow evaluation of LV diastolic function. - Aortic valve: Poorly visualized. - Mitral valve: Calcified annulus. Mildly thickened leaflets . - Left atrium: The atrium was moderately dilated. - Atrial septum: There was increased thickness of the septum,   consistent with lipomatous hypertrophy. - Pulmonic valve: There was trivial regurgitation.  Patient Profile     66 y.o. female with ongoing tobacco abuse, COPD with multiple admissions for exacerbation requiring intubation. We saw her yesterday because of mildly elevated troponins and a 2-D echo that showed new LV dysfunction with segmental wall motion abnormalities. She does complain of "heartburn" over the last month which has been worrying her.  Assessment & Plan    1: Non-STEMI- Ms Dedic was admitted on 05/28/17 with COPD exacerbation requiring intubation. She has had similar episodes in the past. Her enzymes were borderline positive. HER-2 D echo revealed new LV dysfunction with segmental wall motion analysis, and  new since her previous echo. She has been complaining of indigestion over the last month. We will plan right and left heart cath tomorrow. The patient understands that risks included but are not limited to stroke (1 in 1000), death (1 in 24), kidney failure [usually temporary] (1 in 500), bleeding (1 in 200), allergic reaction [possibly serious] (1 in 200). The patient understands and agrees  to proceed   2: LV dysfunction-LV dysfunction is new since prior echo in the 40-45% range with segmental wall motion analysis. Her BNP was elevated as well suggesting she has a heart failure component to her respiratory insufficiency. I/os are positive almost 9 L. She is being diuresed with Lasix 40 mg a day.   3: Hypokalemia-resolved  Precatheterization orders have been written and the patient has been put on the scheduling board for tomorrow.   For questions or updates, please contact [place] Please consult [email or web address] for contact info under Cardiology/STEMI.      Angelina Sheriff, MD  05/31/2017, 11:10 AM

## 2017-05-31 NOTE — Progress Notes (Signed)
ANTICOAGULATION CONSULT NOTE  Pharmacy Consult for Heparin Indication: NSTEMI/planned Cath  Allergies  Allergen Reactions  . Augmentin [Amoxicillin-Pot Clavulanate] Swelling    Patient Measurements: Height:  (162.6 cm) Weight: 120 lb 5.9 oz (54.6 kg) IBW/kg (Calculated) : 54.7 Heparin Dosing Weight: 59.6 kg  Vital Signs: Temp: 98.8 F (37.1 C) (10/11 1140) Temp Source: Oral (10/11 1140) BP: 108/48 (10/11 1000) Pulse Rate: 98 (10/11 1300)  Labs:  Recent Labs  05/29/17 0127  05/29/17 0856 05/29/17 1429 05/30/17 0425 05/30/17 1348 05/30/17 1929 05/31/17 0346  HGB 12.1  --   --   --  11.5*  --   --  12.1  HCT 37.5  --   --   --  34.8*  --   --  37.3  PLT 165  --   --   --  127*  --   --  132*  HEPARINUNFRC  --   --   --   --   --   --  0.62 0.42  CREATININE 0.79  --   --   --  0.84 0.91  --  0.79  TROPONINI  --   < > 0.84* 0.97* 0.88*  --   --   --   < > = values in this interval not displayed.  Estimated Creatinine Clearance: 60.4 mL/min (by C-G formula based on SCr of 0.79 mg/dL).   Medical History: Past Medical History:  Diagnosis Date  . COPD (chronic obstructive pulmonary disease) (HCC)     Medications:  Infusions:  . sodium chloride    . sodium chloride    . [START ON 06/01/2017] sodium chloride    . heparin 700 Units/hr (05/31/17 0700)    Assessment: 66 yo female with ACS/questionable NSTEMI on heparin. Plans noted for right and left heart cath this Friday (10/12).  Heparin levels are therapeutic on current rate of 700 units/hr.  No change needed. No bleeding reported and CBC remains stable.   Goal of Therapy:  INR 2-3 Monitor platelets by anticoagulation protocol: Yes   Plan:  Continue heparin at 700 units/hr. Daily heparin levels and CBC while on therapy.  Follow-up plan post Cath   Link Snuffer, PharmD, BCPS Clinical Pharmacist Clinical phone 05/31/2017 until 3:30 [phone] After hours, please call #[phone]  05/31/2017 1:56  PM

## 2017-05-31 NOTE — Progress Notes (Signed)
PULMONARY / CRITICAL CARE MEDICINE   Name: Samantha Dyer MRN: [id number] DOB: 1950-09-13    ADMISSION DATE:  05/28/2017 CONSULTATION DATE:  05/29/2017  REFERRING MD:  Dr. Blinda Leatherwood   CHIEF COMPLAINT:  Respiratory failure  HISTORY OF PRESENT ILLNESS:   66 year old female with history of COPD gold IV and diastolic heart failure. On 2L O2 baseline. She has been admitted and intubated in the past. According to clinic notes she continues to smoke. She called EMS in the late PM hours of 10/8 for dyspnea. Upon their arrival she was found to have O2 sats in the 50s and was cyanotic. She was treated with duoneb, magnesium, solumedrol and CPAP and only improved to O2 sats in 70s. She was intubated in ED. No wheeze on presentation, but did sound "wet" per RN. CXR consistent with pulmonary edema. Lasix was ordered. PCCM asked to admit on 05/29/2017.    REVIEW OF SYSTEMS:   No fevers, chills, rigors.    SUBJECTIVE:  No acute events overnight. Extubated yesterday and doing well on 3L O2 per [place].  Anxious to go home today.    VITAL SIGNS: BP (!) 125/58   Pulse 88   Temp 98.5 F (36.9 C) (Oral)   Resp 19   Ht  (1.626 m)   Wt 120 lb 5.9 oz (54.6 kg)   SpO2 97%   BMI 20.66 kg/m   HEMODYNAMICS:    VENTILATOR SETTINGS: Not on vent.   INTAKE / OUTPUT: I/O last 3 completed shifts: In: 1799.8 [I.V.:686.8; NG/GT:663; IV Piggyback:450] Out: 5390 [Urine:5390]  PHYSICAL EXAMINATION: Gen:      Pleasant 66 yo F, NAD  HEENT:  EOMI, PERRL  Neck:     Supple  Lungs:    CTAB; normal respiratory effort, no crackles  CV:         RRR no MRG noted   Abd:      soft, non-tender; no distention, +BS  Ext:    Trace BL LE edema  Skin:      Warm and dry; no rash  Neuro:   Awake, alert, follows commands, wiggles toes   LABS: Na 130 K+ 3.0  Mag 1.9 Phos 3.3 Trop 0.59>0.84>0.97>0.88  BMET  Recent Labs Lab 05/30/17 0425 05/30/17 1348 05/31/17 0346  NA 130* 132* 133*  K 3.0* 4.4 3.8  CL 90* 90*  89*  CO2 29 34* 36*  BUN 12 21* 16  CREATININE 0.84 0.91 0.79  GLUCOSE 137* 121* 107*   Electrolytes  Recent Labs Lab 05/29/17 0549 05/30/17 0425 05/30/17 1348 05/31/17 0346  CALCIUM  --  8.6* 8.5* 8.7*  MG 2.2 1.9  --   --   PHOS 2.5 3.3  --   --    CBC  Recent Labs Lab 05/29/17 0127 05/30/17 0425 05/31/17 0346  WBC 15.0* 7.9 12.2*  HGB 12.1 11.5* 12.1  HCT 37.5 34.8* 37.3  PLT 165 127* 132*   Coag's No results for input(s): APTT, INR in the last 168 hours.  Sepsis Markers  Recent Labs Lab 05/29/17 0134 05/29/17 0549  LATICACIDVEN 1.65  --   PROCALCITON  --  0.22   ABG  Recent Labs Lab 05/29/17 0055 05/29/17 0157  PHART 7.164* 7.361  PCO2ART 98.5* 59.2*  PO2ART 151* 130.0*   Liver Enzymes  Recent Labs Lab 05/29/17 0127  AST 26  ALT 13*  ALKPHOS 88  BILITOT 0.9  ALBUMIN 3.3*   Cardiac Enzymes  Recent Labs Lab 05/29/17 0856 05/29/17 1429  05/30/17 0425  TROPONINI 0.84* 0.97* 0.88*   Glucose  Recent Labs Lab 05/29/17 1939 05/30/17 0005 05/30/17 0342 05/30/17 2349 05/31/17 0352 05/31/17 0747  GLUCAP 132* 153* 127* 113* 106* 128*   IMAGING/STUDIES:  -CXR 10/10 - support tubes in good position; COPD with mild interstitial prominence greatest in RUL and RLL may reflect PNA or asymmetric pulmonary edema, no alveolar infiltrate  -Echo 10/9 >> EF 40-45%  -RVP 10/10 - Negative  -CXR 10/11 -   CULTURES: Bcx 10/9 >> NGx1D  ANTIBIOTICS: Levaquin 10/9 >>  SIGNIFICANT EVENTS: 10/8: ETT  10/9: Admit 10/10: Extub   LINES/TUBES: ETT 10/8>>> NG/OG 10/9 >> PIV L antecub 10/8 PIV L forearm 10/9 PIV R forearm 10/9  DISCUSSION: 66 year old female with known severe COPD on home O2, and known diastolic CHF admitted for mixed respiratory failure. Most likely due to pulmonary edema in the setting of decompensated heart failure, but the differential also includes COPD exacerbation and less likely community-acquired pneumonia. Plan to  treat with diuresis, mechanical ventilation, nebulized bronchodilators, intravenous steroids, and antibiotics coverage.   ASSESSMENT / PLAN:  PULMONARY Acute on chronic hypoxemic/hypercarbic respiratory failure, resolved  Pulmonary edema Likely acute exacerbation of COPD with CAP  Extubated 10/10, on 3L O2 per [place] (home baseline of 2L)  Continue nebs and solumedrol 40 mg IV Q12     CARDIOVASCULAR Acute on chronic diastolic heart failure Elevated troponins x3  2D Echo with EF 40-45%, severe hypokinesis and increased thickness of atrial septum, worsening function from previous 55-60% in 06/2016.  Continue diuresis with Lasix  Per cardiology will likely need L&R cath when extubated given her new LV dysfunction Add Coreg, ASA 81 mg daily and lipitor 40 mg when able Follow up lipid panel Tentative plan for cath on Friday 10/12   RENAL Hyponatremia Lasix 40 mg IV q12 Follow daily BMP Replete lytes  GASTROINTESTINAL History of GERD Heart healthy carb mod diet  Anticipate NPO at [facility] on 10/12 for cath   HEMATOLOGIC No acute issues SQ heparin Stable Hgb 12.1  Follow CBC  INFECTIOUS Rule out pneumonia given SIRS criteria and chest x-ray changes Continue Levaquin  F/u blood cultures  Procal wnl 0.22   ENDOCRINE Hyperglycemia with no history of diabetes mellitus No h/o DM  CBG range 120-150 SSI  NEUROLOGIC Acute metabolic encephalopathy, resolved  No longer requiring sedation and pt off vent   DISPO: transfer to tele bed   FAMILY  - Updates: No family available - Inter-disciplinary family meet or Palliative Care meeting due by:  10/16  The patient is critically ill with multiple organ system failure and requires high complexity decision making for assessment and support, frequent evaluation and titration of therapies, advanced monitoring, review of radiographic studies and interpretation of complex data.   Additional critical Care Time devoted to patient care services,  exclusive of separately billable procedures, described in this note is 35 minutes.   Freddrick March, MD [place] PGY-2  05/30/2017

## 2017-05-31 NOTE — Care Management Note (Addendum)
Case Management Note  Patient Details  Name: Samantha Dyer MRN: [id number] Date of Birth: 07-02-1951  Subjective/Objective:   Pt admitted with NSTEMI                  Action/Plan:  PTA independent from home with husband on supplemental 02 (2 liters during the day and 2.5 at night supplied by [facility].  Pt has PCP and denied barriers to obtaining/paying for medications.  Pt to discharge home on home ventilator per attending.  CM offered pt choice for equipment and pt chose [facility] - agency contacted and referral accepted.  Pt is also in agreement to switch to [facility] for supplemental oxygen - per insurance requirement of 1 DME supplier per pt.   Expected Discharge Date:                  Expected Discharge Plan:  Home w Home Health Services  In-House Referral:     Discharge planning Services  CM Consult  Post Acute Care Choice:    Choice offered to:  Patient, Spouse  DME Arranged:  Ventilator DME Agency:  [facility]  HH Arranged:    HH Agency:     Status of Service:  In process, will continue to follow  If discussed at Long Length of Stay Meetings, dates discussed:    Additional Comments:  Cherylann Parr, RN 05/31/2017, 2:07 PM

## 2017-06-01 ENCOUNTER — Encounter (HOSPITAL_COMMUNITY): Payer: Self-pay | Admitting: Interventional Cardiology

## 2017-06-01 ENCOUNTER — Telehealth: Payer: Self-pay | Admitting: Pulmonary Disease

## 2017-06-01 ENCOUNTER — Inpatient Hospital Stay (HOSPITAL_COMMUNITY)
Admission: EM | Disposition: A | Discharge status: Home Health | Payer: Self-pay | Source: Home / Self Care | Attending: Thoracic Surgery (Cardiothoracic Vascular Surgery)

## 2017-06-01 DIAGNOSIS — J449 Chronic obstructive pulmonary disease, unspecified: Secondary | ICD-10-CM

## 2017-06-01 DIAGNOSIS — I25110 Atherosclerotic heart disease of native coronary artery with unstable angina pectoris: Secondary | ICD-10-CM

## 2017-06-01 DIAGNOSIS — I5020 Unspecified systolic (congestive) heart failure: Secondary | ICD-10-CM

## 2017-06-01 DIAGNOSIS — I214 Non-ST elevation (NSTEMI) myocardial infarction: Secondary | ICD-10-CM

## 2017-06-01 DIAGNOSIS — I2510 Atherosclerotic heart disease of native coronary artery without angina pectoris: Secondary | ICD-10-CM

## 2017-06-01 HISTORY — PX: RIGHT/LEFT HEART CATH AND CORONARY ANGIOGRAPHY: CATH118266

## 2017-06-01 LAB — HEPARIN LEVEL (UNFRACTIONATED): Heparin Unfractionated: 0.22 IU/mL — ABNORMAL LOW (ref 0.30–0.70)

## 2017-06-01 LAB — CBC
HEMATOCRIT: 36 % (ref 36.0–46.0)
Hemoglobin: 11.5 g/dL — ABNORMAL LOW (ref 12.0–15.0)
MCH: 29.9 pg (ref 26.0–34.0)
MCHC: 31.9 g/dL (ref 30.0–36.0)
MCV: 93.8 fL (ref 78.0–100.0)
PLATELETS: 128 10*3/uL — AB (ref 150–400)
RBC: 3.84 MIL/uL — ABNORMAL LOW (ref 3.87–5.11)
RDW: 14.3 % (ref 11.5–15.5)
WBC: 10 10*3/uL (ref 4.0–10.5)

## 2017-06-01 LAB — POCT I-STAT 3, VENOUS BLOOD GAS (G3P V)
Acid-Base Excess: 14 mmol/L — ABNORMAL HIGH (ref 0.0–2.0)
BICARBONATE: 42.8 mmol/L — AB (ref 20.0–28.0)
O2 Saturation: 70 %
PH VEN: 7.363 (ref 7.250–7.430)
PO2 VEN: 40 mmHg (ref 32.0–45.0)
TCO2: 45 mmol/L — AB (ref 22–32)
pCO2, Ven: 75.3 mmHg (ref 44.0–60.0)

## 2017-06-01 LAB — POCT I-STAT 3, ART BLOOD GAS (G3+)
Acid-Base Excess: 13 mmol/L — ABNORMAL HIGH (ref 0.0–2.0)
BICARBONATE: 41.6 mmol/L — AB (ref 20.0–28.0)
O2 Saturation: 89 %
PCO2 ART: 72.2 mmHg — AB (ref 32.0–48.0)
PO2 ART: 62 mmHg — AB (ref 83.0–108.0)
TCO2: 44 mmol/L — AB (ref 22–32)
pH, Arterial: 7.368 (ref 7.350–7.450)

## 2017-06-01 LAB — PROTIME-INR
INR: 1.09
Prothrombin Time: 14 seconds (ref 11.4–15.2)

## 2017-06-01 LAB — TRIGLYCERIDES: TRIGLYCERIDES: 104 mg/dL (ref <150)

## 2017-06-01 SURGERY — RIGHT/LEFT HEART CATH AND CORONARY ANGIOGRAPHY
Anesthesia: LOCAL

## 2017-06-01 MED ORDER — VERAPAMIL HCL 2.5 MG/ML IV SOLN
INTRAVENOUS | Status: DC | PRN
  Administered 2017-06-01: 10 mL via INTRA_ARTERIAL

## 2017-06-01 MED ORDER — SODIUM CHLORIDE 0.9 % IV SOLN: 250.0000 mL | INTRAVENOUS | Status: DC | PRN

## 2017-06-01 MED ORDER — IOPAMIDOL (ISOVUE-370) INJECTION 76%
INTRAVENOUS | Status: AC
  Filled 2017-06-01: qty 100

## 2017-06-01 MED ORDER — SODIUM CHLORIDE 0.9% FLUSH: 3.0000 mL | INTRAVENOUS | Status: DC | PRN

## 2017-06-01 MED ORDER — ACETAMINOPHEN 325 MG PO TABS: 650.0000 mg | ORAL_TABLET | Freq: Four times a day (QID) | ORAL | Status: DC | PRN

## 2017-06-01 MED ORDER — TIOTROPIUM BROMIDE MONOHYDRATE 18 MCG IN CAPS
18.0000 ug | ORAL_CAPSULE | Freq: Every day | RESPIRATORY_TRACT | Status: DC
  Administered 2017-06-02 – 2017-06-12 (×8): 18 ug via RESPIRATORY_TRACT
  Filled 2017-06-01 (×4): qty 5

## 2017-06-01 MED ORDER — HEPARIN (PORCINE) IN NACL 100-0.45 UNIT/ML-% IJ SOLN
1000.0000 [IU]/h | INTRAMUSCULAR | Status: DC
  Administered 2017-06-01: 800 [IU]/h via INTRAVENOUS
  Administered 2017-06-02 – 2017-06-04 (×3): 900 [IU]/h via INTRAVENOUS
  Administered 2017-06-05: 1000 [IU]/h via INTRAVENOUS
  Filled 2017-06-01 (×3): qty 250

## 2017-06-01 MED ORDER — MIDAZOLAM HCL 2 MG/2ML IJ SOLN
INTRAMUSCULAR | Status: DC | PRN
  Administered 2017-06-01: 2 mg via INTRAVENOUS

## 2017-06-01 MED ORDER — FENTANYL CITRATE (PF) 100 MCG/2ML IJ SOLN
INTRAMUSCULAR | Status: DC | PRN
  Administered 2017-06-01: 25 ug via INTRAVENOUS

## 2017-06-01 MED ORDER — PANTOPRAZOLE SODIUM 40 MG PO TBEC: 40.0000 mg | DELAYED_RELEASE_TABLET | Freq: Every day | ORAL | Status: DC

## 2017-06-01 MED ORDER — MIDAZOLAM HCL 2 MG/2ML IJ SOLN
INTRAMUSCULAR | Status: AC
  Filled 2017-06-01: qty 2

## 2017-06-01 MED ORDER — SODIUM CHLORIDE 0.9% FLUSH
3.0000 mL | Freq: Two times a day (BID) | INTRAVENOUS | Status: DC
  Administered 2017-06-03: 3 mL via INTRAVENOUS

## 2017-06-01 MED ORDER — PREDNISONE 20 MG PO TABS
40.0000 mg | ORAL_TABLET | Freq: Every day | ORAL | Status: DC
  Administered 2017-06-02 – 2017-06-04 (×3): 40 mg via ORAL
  Filled 2017-06-01 (×3): qty 2

## 2017-06-01 MED ORDER — ACETAMINOPHEN 325 MG PO TABS: 650.0000 mg | ORAL_TABLET | ORAL | Status: DC | PRN

## 2017-06-01 MED ORDER — FLUTICASONE FUROATE-VILANTEROL 200-25 MCG/INH IN AEPB
1.0000 | INHALATION_SPRAY | Freq: Every day | RESPIRATORY_TRACT | Status: DC
  Administered 2017-06-01 – 2017-06-12 (×10): 1 via RESPIRATORY_TRACT
  Filled 2017-06-01 (×2): qty 28

## 2017-06-01 MED ORDER — LIDOCAINE HCL 2 % IJ SOLN
INTRAMUSCULAR | Status: AC
  Filled 2017-06-01: qty 10

## 2017-06-01 MED ORDER — HEPARIN (PORCINE) IN NACL 100-0.45 UNIT/ML-% IJ SOLN
800.0000 [IU]/h | INTRAMUSCULAR | Status: DC
  Administered 2017-06-01: 800 [IU]/h via INTRAVENOUS

## 2017-06-01 MED ORDER — FENTANYL CITRATE (PF) 100 MCG/2ML IJ SOLN
INTRAMUSCULAR | Status: AC
  Filled 2017-06-01: qty 2

## 2017-06-01 MED ORDER — SODIUM CHLORIDE 0.9 % WEIGHT BASED INFUSION
1.0000 mL/kg/h | INTRAVENOUS | Status: AC
  Administered 2017-06-01: 1 mL/kg/h via INTRAVENOUS

## 2017-06-01 MED ORDER — LIDOCAINE HCL (PF) 1 % IJ SOLN
INTRAMUSCULAR | Status: DC | PRN
  Administered 2017-06-01: 5 mL via SUBCUTANEOUS

## 2017-06-01 MED ORDER — VERAPAMIL HCL 2.5 MG/ML IV SOLN
INTRAVENOUS | Status: AC
  Filled 2017-06-01: qty 2

## 2017-06-01 MED ORDER — ONDANSETRON HCL 4 MG/2ML IJ SOLN: 4.0000 mg | Freq: Four times a day (QID) | INTRAMUSCULAR | Status: DC | PRN

## 2017-06-01 MED ORDER — HEPARIN (PORCINE) IN NACL 2-0.9 UNIT/ML-% IJ SOLN
INTRAMUSCULAR | Status: AC
  Filled 2017-06-01: qty 1000

## 2017-06-01 SURGICAL SUPPLY — 17 items
CATH BALLN WEDGE 5F 110CM (CATHETERS) ×1 IMPLANT
CATH INFINITI 5 FR JL3.5 (CATHETERS) ×1 IMPLANT
CATH INFINITI JR4 5F (CATHETERS) ×1 IMPLANT
COVER PRB 48X5XTLSCP FOLD TPE (BAG) IMPLANT
COVER PROBE 5X48 (BAG) ×2
DEVICE RAD TR BAND REGULAR (VASCULAR PRODUCTS) ×1 IMPLANT
GLIDESHEATH SLEND SS 6F .021 (SHEATH) ×1 IMPLANT
GUIDEWIRE INQWIRE 1.5J.035X260 (WIRE) IMPLANT
INQWIRE 1.5J .035X260CM (WIRE) ×2
KIT HEART LEFT (KITS) ×2 IMPLANT
PACK CARDIAC CATHETERIZATION (CUSTOM PROCEDURE TRAY) ×2 IMPLANT
SHEATH GLIDE SLENDER 4/5FR (SHEATH) ×1 IMPLANT
SYR MEDRAD MARK V 150ML (SYRINGE) ×2 IMPLANT
TRANSDUCER W/STOPCOCK (MISCELLANEOUS) ×2 IMPLANT
TUBING CIL FLEX 10 FLL-RA (TUBING) ×2 IMPLANT
WIRE ASAHI PROWATER 180CM (WIRE) ×1 IMPLANT
WIRE EMERALD 3MM-J .025X260CM (WIRE) ×1 IMPLANT

## 2017-06-01 NOTE — Consult Note (Signed)
Reason for Consult:3 vessel CAD Referring Physician: Dr. Evon Slack Samantha Dyer is an 66 y.o. female.  HPI: 66 yo woman with a history of home O2 dependent Gold IV COPD, followed by Dr. Elsworth Soho as an outpatient. She presented on 10/8 with shortness of breath. She was found to be hypoxic with acute on chronic respiratory failure. She was intubated in the ED. CXR showed pulmonary edema. BNP was 1600. She ruled in for a NSTEMI with a troponin if 0.97. She was diuresed and was extubated on 05/30/17. An Echocardiogram done on 10/9 showed moderate LV dysfunction with an EF of 40-45% with anterior and inferior hypokinesis.   She states that she was having frequent "indigestion" for 2 months prior to admission.  She was smoking up until the day of admission.  Past Medical History:  Diagnosis Date  . COPD (chronic obstructive pulmonary disease) ([place])     Past Surgical History:  Procedure Laterality Date  . RIGHT/LEFT HEART CATH AND CORONARY ANGIOGRAPHY N/A 06/01/2017   Procedure: RIGHT/LEFT HEART CATH AND CORONARY ANGIOGRAPHY;  Surgeon: Jettie Booze, MD;  Location: [facility];  Service: Cardiovascular;  Laterality: N/A;    History reviewed. No pertinent family history.  Social History:  reports that she has been smoking Cigarettes.  She has been smoking about 1.00 pack per day. She has never used smokeless tobacco. She reports that she does not drink alcohol or use drugs.  Allergies:  Allergies  Allergen Reactions  . Augmentin [Amoxicillin-Pot Clavulanate] Swelling    Medications:  Scheduled: . aspirin  81 mg Oral Daily  . atorvastatin  40 mg Oral q1800  . carvedilol  6.25 mg Oral BID WC  . fluticasone furoate-vilanterol  1 puff Inhalation Daily  . furosemide  40 mg Intravenous Daily  . ipratropium-albuterol  3 mL Nebulization Q6H  . levofloxacin  750 mg Oral Daily  . mouth rinse  15 mL Mouth Rinse BID  . pantoprazole  40 mg Oral QHS  . predniSONE  60 mg Oral Q  breakfast  . sodium chloride flush  3 mL Intravenous Q12H  . tiotropium  18 mcg Inhalation Daily    Results for orders placed or performed during the hospital encounter of 05/28/17 (from the past 48 hour(s))  Heparin level (unfractionated)     Status: None   Collection Time: 05/30/17  7:29 PM  Result Value Ref Range   Heparin Unfractionated 0.62 0.30 - 0.70 IU/mL    Comment:        IF HEPARIN RESULTS ARE BELOW EXPECTED VALUES, AND PATIENT DOSAGE HAS BEEN CONFIRMED, SUGGEST FOLLOW UP TESTING OF ANTITHROMBIN III LEVELS.   Glucose, capillary     Status: Abnormal   Collection Time: 05/30/17  8:26 PM  Result Value Ref Range   Glucose-Capillary 151 (H) 65 - 99 mg/dL   Comment 1 Capillary Specimen   Glucose, capillary     Status: Abnormal   Collection Time: 05/30/17 11:49 PM  Result Value Ref Range   Glucose-Capillary 113 (H) 65 - 99 mg/dL   Comment 1 Notify RN   BMET in AM     Status: Abnormal   Collection Time: 05/31/17  3:46 AM  Result Value Ref Range   Sodium 133 (L) 135 - 145 mmol/L   Potassium 3.8 3.5 - 5.1 mmol/L   Chloride 89 (L) 101 - 111 mmol/L   CO2 36 (H) 22 - 32 mmol/L   Glucose, Bld 107 (H) 65 - 99 mg/dL   BUN 16  6 - 20 mg/dL   Creatinine, Ser 0.79 0.44 - 1.00 mg/dL   Calcium 8.7 (L) 8.9 - 10.3 mg/dL   GFR calc non Af Amer >60 >60 mL/min   GFR calc Af Amer >60 >60 mL/min    Comment: (NOTE) The eGFR has been calculated using the CKD EPI equation. This calculation has not been validated in all clinical situations. eGFR's persistently <60 mL/min signify possible Chronic Kidney Disease.    Anion gap 8 5 - 15  Heparin level (unfractionated)     Status: None   Collection Time: 05/31/17  3:46 AM  Result Value Ref Range   Heparin Unfractionated 0.42 0.30 - 0.70 IU/mL    Comment:        IF HEPARIN RESULTS ARE BELOW EXPECTED VALUES, AND PATIENT DOSAGE HAS BEEN CONFIRMED, SUGGEST FOLLOW UP TESTING OF ANTITHROMBIN III LEVELS.   CBC     Status: Abnormal    Collection Time: 05/31/17  3:46 AM  Result Value Ref Range   WBC 12.2 (H) 4.0 - 10.5 K/uL   RBC 4.02 3.87 - 5.11 MIL/uL   Hemoglobin 12.1 12.0 - 15.0 g/dL   HCT 37.3 36.0 - 46.0 %   MCV 92.8 78.0 - 100.0 fL   MCH 30.1 26.0 - 34.0 pg   MCHC 32.4 30.0 - 36.0 g/dL   RDW 14.7 11.5 - 15.5 %   Platelets 132 (L) 150 - 400 K/uL  Lipid panel     Status: Abnormal   Collection Time: 05/31/17  3:46 AM  Result Value Ref Range   Cholesterol 196 0 - 200 mg/dL   Triglycerides 84 <150 mg/dL   HDL 63 >40 mg/dL   Total CHOL/HDL Ratio 3.1 RATIO   VLDL 17 0 - 40 mg/dL   LDL Cholesterol 116 (H) 0 - 99 mg/dL    Comment:        Total Cholesterol/HDL:CHD Risk Coronary Heart Disease Risk Table                     Men   Women  1/2 Average Risk   3.4   3.3  Average Risk       5.0   4.4  2 X Average Risk   9.6   7.1  3 X Average Risk  23.4   11.0        Use the calculated Patient Ratio above and the CHD Risk Table to determine the patient's CHD Risk.        ATP III CLASSIFICATION (LDL):  <100     mg/dL   Optimal  100-129  mg/dL   Near or Above                    Optimal  130-159  mg/dL   Borderline  160-189  mg/dL   High  >190     mg/dL   Very High   Glucose, capillary     Status: Abnormal   Collection Time: 05/31/17  3:52 AM  Result Value Ref Range   Glucose-Capillary 106 (H) 65 - 99 mg/dL   Comment 1 Notify RN   Glucose, capillary     Status: Abnormal   Collection Time: 05/31/17  7:47 AM  Result Value Ref Range   Glucose-Capillary 128 (H) 65 - 99 mg/dL   Comment 1 Capillary Specimen    Comment 2 Notify RN   Triglycerides     Status: None   Collection Time: 06/01/17  3:11 AM  Result  Value Ref Range   Triglycerides 104 <150 mg/dL  Heparin level (unfractionated)     Status: Abnormal   Collection Time: 06/01/17  3:11 AM  Result Value Ref Range   Heparin Unfractionated 0.22 (L) 0.30 - 0.70 IU/mL    Comment:        IF HEPARIN RESULTS ARE BELOW EXPECTED VALUES, AND PATIENT DOSAGE HAS  BEEN CONFIRMED, SUGGEST FOLLOW UP TESTING OF ANTITHROMBIN III LEVELS.   CBC     Status: Abnormal   Collection Time: 06/01/17  3:11 AM  Result Value Ref Range   WBC 10.0 4.0 - 10.5 K/uL   RBC 3.84 (L) 3.87 - 5.11 MIL/uL   Hemoglobin 11.5 (L) 12.0 - 15.0 g/dL   HCT 36.0 36.0 - 46.0 %   MCV 93.8 78.0 - 100.0 fL   MCH 29.9 26.0 - 34.0 pg   MCHC 31.9 30.0 - 36.0 g/dL   RDW 14.3 11.5 - 15.5 %   Platelets 128 (L) 150 - 400 K/uL  Protime-INR     Status: None   Collection Time: 06/01/17  3:11 AM  Result Value Ref Range   Prothrombin Time 14.0 11.4 - 15.2 seconds   INR 1.09     No results found.  Review of Systems  Constitutional: Positive for malaise/fatigue. Negative for chills and fever.  Respiratory: Positive for cough, shortness of breath and wheezing.   Cardiovascular: Positive for chest pain, orthopnea and leg swelling. Negative for claudication.  Gastrointestinal: Positive for heartburn. Negative for nausea and vomiting.  Genitourinary: Positive for frequency. Negative for dysuria.  Neurological: Negative for focal weakness and loss of consciousness.  All other systems reviewed and are negative.  Blood pressure (!) 84/60, pulse 66, temperature 97.7 F (36.5 C), temperature source Oral, resp. rate (!) 0, height '5\' 4"'$  (1.626 m), weight 127 lb 8 oz (57.8 kg), SpO2 100 %. Physical Exam  Vitals reviewed. Constitutional: She is oriented to person, place, and time. No distress.  HENT:  Head: Normocephalic and atraumatic.  Mouth/Throat: No oropharyngeal exudate.  Eyes: Conjunctivae and EOM are normal. No scleral icterus.  Neck: Neck supple. No thyromegaly present.  Cardiovascular: Normal rate and regular rhythm.   Murmur (2/6 systolic ) heard. Pulses:      Carotid pulses are on the right side with bruit, and on the left side with bruit.      Radial pulses are 2+ on the right side, and 2+ on the left side.       Dorsalis pedis pulses are 0 on the right side, and 0 on the left  side.       Posterior tibial pulses are 0 on the right side, and 0 on the left side.  Respiratory: Effort normal. No respiratory distress. She has wheezes (mild). She has rales (faint ).  GI: Soft. Bowel sounds are normal. She exhibits no distension. There is no tenderness.  Musculoskeletal: She exhibits no edema.  Lymphadenopathy:    She has no cervical adenopathy.  Neurological: She is alert and oriented to person, place, and time. No cranial nerve deficit. She exhibits normal muscle tone.  Skin: Skin is warm.   CARDIAC CATHETERIZATION Conclusion     Ost LAD lesion, 90 %stenosed. Complex, calcified trifurcation lesion with ramus.  Ost Ramus lesion, 90 %stenosed. The ramus has two large branches which cover the lateral wall. The circumflex is relatively small.  Ost RCA to Prox RCA lesion, 100 %stenosed. There are left to right collaterals.  Prox Cx lesion, 70 %stenosed.  LV end diastolic pressure is normal.  The left ventricular ejection fraction is 35-45% by visual estimate.  There is no aortic valve stenosis.  Normal PA pressures; Ao 89%, PA 70%, CO 7.2 L/min; CI 4.4   Severe three vessel CAD.  Plan for CVTS consult for CABG.   Restart heparin in 6 hours.   RHC CO= 7.25 L/min(index 4.48) PA 35/16 RV 36/0 PCWP 17 LV systolic 160 Aortic systolic 109  Assessment/Plan: 66 yo woman with a history of tobacco abuse and severe COPD (Gold class IV) who presented with acute on chronic respiratory failure due to pulmonary edema. She was found to have severe 3 vessel CAD at cath. CABG is indicated for survival benefit and relief of symptoms.  She has severe COPD. We need up to date PFTs to assess pulmonary risk. Only value I can find in the chart is an FEV1 of 30% of predicted in 2011.  She has carotid bruits bilaterally. We need carotid duplex to further evaluate for ECCOD.  I discussed the general nature of CABG with Mrs. Mehrer and her husband. They understand it is a  major operative procedure with significant risks, including respiratory failure and death. Will have a more lengthy discussion once we have more information.  She will need Pulmonary clearance to have CABG  Melrose Nakayama 06/01/2017, 4:49 PM

## 2017-06-01 NOTE — Telephone Encounter (Signed)
Samantha Dyer will need hospital follow up visit with NP within 2 weeks.  Phone lines are down with power outages.  Please call her to schedule an appointment once able.  Thank you!   [name], NP-C [facility] Pgr: [phone] or if no answer [phone] 06/01/2017, 9:45 AM

## 2017-06-01 NOTE — Progress Notes (Signed)
Progress Note  Patient Name: Samantha Dyer Date of Encounter: 06/01/2017  Primary Cardiologist: [facility]   Patient was successfully extubated 2 day ago . She is breathing comfortably sitting in a chair with nasal oxygen. Plan for right and left heart cath later today   Inpatient Medications    Scheduled Meds: . aspirin  81 mg Oral Daily  . atorvastatin  40 mg Oral q1800  . carvedilol  6.25 mg Oral BID WC  . furosemide  40 mg Intravenous Daily  . ipratropium-albuterol  3 mL Nebulization Q6H  . levofloxacin  750 mg Oral Daily  . mouth rinse  15 mL Mouth Rinse BID  . pantoprazole  40 mg Oral QHS  . predniSONE  60 mg Oral Q breakfast  . sodium chloride flush  3 mL Intravenous Q12H   Continuous Infusions: . sodium chloride 10 mL/hr at 06/01/17 0725  . sodium chloride    . heparin 800 Units/hr (06/01/17 0450)   PRN Meds: sodium chloride, sodium chloride flush   Vital Signs    Vitals:   06/01/17 0000 06/01/17 0606 06/01/17 0816 06/01/17 0900  BP: (!) 130/59 (!) 102/55 (!) 126/55   Pulse: 76 77 75   Resp: 20  18   Temp: 98.4 F (36.9 C) 98.1 F (36.7 C) 98.3 F (36.8 C)   TempSrc:  Oral Oral   SpO2: 99% 97% 99% 93%  Weight:  127 lb 8 oz (57.8 kg)    Height:        Intake/Output Summary (Last 24 hours) at 06/01/17 0958 Last data filed at 06/01/17 0918  Gross per 24 hour  Intake              469 ml  Output              925 ml  Net             -456 ml   Filed Weights   05/31/17 0459 05/31/17 2013 06/01/17 0606  Weight: 120 lb 5.9 oz (54.6 kg) 127 lb 1.6 oz (57.7 kg) 127 lb 8 oz (57.8 kg)    Telemetry    Normal sinus rhythm in the 90s - Personally Reviewed  ECG    Not performed today - Personally Reviewed  Physical Exam   GEN: No acute distress.   Neck: No JVD Cardiac: RRR, no murmurs, rubs, or gallops.  Respiratory: Clear to auscultation bilaterally.Successfully extubated yesterday GI: Soft, nontender, non-distended  MS: No edema; No  deformity. Neuro:  Nonfocal  Psych: Normal affect   Labs    Chemistry  Recent Labs Lab 05/29/17 0127 05/30/17 0425 05/30/17 1348 05/31/17 0346  NA 129* 130* 132* 133*  K 4.4 3.0* 4.4 3.8  CL 89* 90* 90* 89*  CO2 32 29 34* 36*  GLUCOSE 221* 137* 121* 107*  BUN 7 12 21* 16  CREATININE 0.79 0.84 0.91 0.79  CALCIUM 8.4* 8.6* 8.5* 8.7*  PROT 5.2*  --   --   --   ALBUMIN 3.3*  --   --   --   AST 26  --   --   --   ALT 13*  --   --   --   ALKPHOS 88  --   --   --   BILITOT 0.9  --   --   --   GFRNONAA >60 >60 >60 >60  GFRAA >60 >60 >60 >60  ANIONGAP '8 11 8 8     '$ Hematology  Recent Labs Lab 05/30/17 0425 05/31/17 0346 06/01/17 0311  WBC 7.9 12.2* 10.0  RBC 3.85* 4.02 3.84*  HGB 11.5* 12.1 11.5*  HCT 34.8* 37.3 36.0  MCV 90.4 92.8 93.8  MCH 29.9 30.1 29.9  MCHC 33.0 32.4 31.9  RDW 14.1 14.7 14.3  PLT 127* 132* 128*    Cardiac Enzymes  Recent Labs Lab 05/29/17 0549 05/29/17 0856 05/29/17 1429 05/30/17 0425  TROPONINI 0.59* 0.84* 0.97* 0.88*     Recent Labs Lab 05/29/17 0132  TROPIPOC 0.04     BNP  Recent Labs Lab 05/29/17 0127 05/29/17 0530  BNP 629.9* 1,600.6*     DDimer No results for input(s): DDIMER in the last 168 hours.   Radiology    No results found.  Cardiac Studies   2-D echocardiogram (05/29/17)  Study Conclusions  - Left ventricle: The cavity size was normal. There was mild focal   basal hypertrophy of the septum. Systolic function was mildly to   moderately reduced. The estimated ejection fraction was in the   range of 40% to 45%. There is severe hypokinesis of the   basal-midinferoseptal myocardium. There is akinesis of the   basal-midinferior myocardium. There is hypokinesis of the   basal-midanteroseptal myocardium. The study is not technically   sufficient to allow evaluation of LV diastolic function. - Aortic valve: Poorly visualized. - Mitral valve: Calcified annulus. Mildly thickened leaflets . - Left atrium:  The atrium was moderately dilated. - Atrial septum: There was increased thickness of the septum,   consistent with lipomatous hypertrophy. - Pulmonic valve: There was trivial regurgitation.  Patient Profile     66 y.o. female with ongoing tobacco abuse, COPD with multiple admissions for exacerbation requiring intubation. We saw her yesterday because of mildly elevated troponins and a 2-D echo that showed new LV dysfunction with segmental wall motion abnormalities. She does complain of "heartburn" over the last month which has been worrying her.  Assessment & Plan    1: Non-STEMI- Ms Mceuen was admitted on 05/28/17 with COPD exacerbation requiring intubation. She has had similar episodes in the past. Her enzymes were borderline positive. HER-2 D echo revealed new LV dysfunction with segmental wall motion analysis, and new since her previous echo. She has been complaining of indigestion over the last month. We will plan right and left heart cath Later today. The patient understands that risks included but are not limited to stroke (1 in 1000), death (1 in 26), kidney failure [usually temporary] (1 in 500), bleeding (1 in 200), allergic reaction [possibly serious] (1 in 200). The patient understands and agrees to proceed   2: LV dysfunction-LV dysfunction is new since prior echo in the 40-45% range with segmental wall motion analysis. Her BNP was elevated as well suggesting she has a heart failure component to her respiratory insufficiency. I/os are positive almost 9 L. She is being diuresed with Lasix 40 mg a day. Her fluid balance is -7 L with IV diuresis. She feels clinically improved.  3: Hypokalemia-resolved  Precatheterization orders have been written and the patient has been put on the scheduling board for tomorrow.   For questions or updates, please contact [facility] Please consult [email or web address] for contact info under Cardiology/STEMI.      Angelina Sheriff, MD    06/01/2017, 9:58 AM

## 2017-06-01 NOTE — Interval H&P Note (Signed)
Cath Lab Visit (complete for each Cath Lab visit)  Clinical Evaluation Leading to the Procedure:   ACS: Yes.    Non-ACS:    Anginal Classification: CCS IV  Anti-ischemic medical therapy: Minimal Therapy (1 class of medications)  Non-Invasive Test Results: No non-invasive testing performed  Prior CABG: No previous CABG  EF decreased on echo    History and Physical Interval Note:  06/01/2017 12:27 PM  Samantha Dyer  has presented today for surgery, with the diagnosis of nstemi,chf  The various methods of treatment have been discussed with the patient and family. After consideration of risks, benefits and other options for treatment, the patient has consented to  Procedure(s): RIGHT/LEFT HEART CATH AND CORONARY ANGIOGRAPHY (N/A) as a surgical intervention .  The patient's history has been reviewed, patient examined, no change in status, stable for surgery.  I have reviewed the patient's chart and labs.  Questions were answered to the patient's satisfaction.     Lance Muss

## 2017-06-01 NOTE — Progress Notes (Signed)
ANTICOAGULATION CONSULT NOTE - Follow Up Consult  Pharmacy Consult for Heparin  Indication: chest pain/ACS  Allergies  Allergen Reactions  . Augmentin [Amoxicillin-Pot Clavulanate] Swelling    Patient Measurements: Height:  (162.6 cm) Weight: 127 lb 8 oz (57.8 kg) (b scale) IBW/kg (Calculated) : 54.7  Vital Signs: Temp: 97.7 F (36.5 C) (10/12 1330) Temp Source: Oral (10/12 1330) BP: 87/58 (10/12 1415) Pulse Rate: 65 (10/12 1415)  Labs:  Recent Labs  05/30/17 0425 05/30/17 1348 05/30/17 1929 05/31/17 0346 06/01/17 0311  HGB 11.5*  --   --  12.1 11.5*  HCT 34.8*  --   --  37.3 36.0  PLT 127*  --   --  132* 128*  LABPROT  --   --   --   --  14.0  INR  --   --   --   --  1.09  HEPARINUNFRC  --   --  0.62 0.42 0.22*  CREATININE 0.84 0.91  --  0.79  --   TROPONINI 0.88*  --   --   --   --     Estimated Creatinine Clearance: 60.5 mL/min (by C-G formula based on SCr of 0.79 mg/dL).   Assessment: 66 yof on heparin for NSTEMI. Not on anticoagulation PTA. Now s/p cath 10/12, found to have multivessel disease and awaiting evaluation for CABG. Pharmacy consulted to resume heparin 6 hours post-sheath removal. Sheath removed at 1306 per cath procedure log. CBC low but stable. No bleeding reported.  Goal of Therapy:  Heparin level 0.3-0.7 units/ml Monitor platelets by anticoagulation protocol: Yes   Plan:  Resume heparin at previous rate 800 units/hr at 1900 (6hrs post-sheath removal) 6h heparin level Daily heparin level/CBC Monitor for s/sx bleeding F/u CVTS plans   Babs Bertin, PharmD, BCPS Clinical Pharmacist Rx Phone # for today: [phone] After 3:30PM, please call Main Rx: [phone] 06/01/2017 2:32 PM

## 2017-06-01 NOTE — Telephone Encounter (Signed)
I tried to call pt and her line was unavailable. I will try again later.

## 2017-06-01 NOTE — Progress Notes (Signed)
PULMONARY / CRITICAL CARE MEDICINE   Name: Samantha Dyer MRN: [id number] DOB: 04/12/1951    ADMISSION DATE:  05/28/2017 CONSULTATION DATE:  05/29/2017  REFERRING MD:  Dr. Blinda Leatherwood  CHIEF COMPLAINT:  Respiratory failure  BRIEF SUMMARY:  66 year old female with history of 2L O2 dependent GOLD IV COPD and diastolic heart failure. She has been admitted and intubated in the past. According to clinic notes she continues to smoke. She called EMS in the late PM hours of 10/8 for dyspnea. Upon their arrival she was found to have O2 sats in the 50s and was cyanotic. She was treated with duoneb, magnesium, solumedrol and CPAP and only improved to O2 sats in 70s. She was intubated in ED. CXR consistent with pulmonary edema. Lasix was ordered. PCCM asked to admit.    SUBJECTIVE:  Pt states she is ready to go home.  Pending heart cath 10/12 at noon.  No acute issues overnight.  O2 at baseline needs.    VITAL SIGNS: BP (!) 126/55 (BP Location: Right Arm)   Pulse 75   Temp 98.3 F (36.8 C) (Oral)   Resp 18   Ht  (1.626 m)   Wt 127 lb 8 oz (57.8 kg) Comment: b scale  SpO2 99%   BMI 21.89 kg/m   HEMODYNAMICS:    VENTILATOR SETTINGS:    INTAKE / OUTPUT: I/O last 3 completed shifts: In: 986 [P.O.:100; I.V.:536; IV Piggyback:350] Out: 4390 [Urine:4390]  PHYSICAL EXAMINATION: General: well developed adult female in NAD  HEENT: MM pink/moist, no jvd PSY: calm/appropriate Neuro: AAOx4, speech clear, MAE  CV: s1s2 rrr, no m/r/g PULM: even/non-labored, lungs bilaterally diminished but clear, occasional soft wheeze on left WU:JWJX, non-tender, bsx4 active  Extremities: warm/dry, no edema  Skin: no rashes or lesions  LABS:  BMET  Recent Labs Lab 05/30/17 0425 05/30/17 1348 05/31/17 0346  NA 130* 132* 133*  K 3.0* 4.4 3.8  CL 90* 90* 89*  CO2 29 34* 36*  BUN 12 21* 16  CREATININE 0.84 0.91 0.79  GLUCOSE 137* 121* 107*    Electrolytes  Recent Labs Lab 05/29/17 0549  05/30/17 0425 05/30/17 1348 05/31/17 0346  CALCIUM  --  8.6* 8.5* 8.7*  MG 2.2 1.9  --   --   PHOS 2.5 3.3  --   --     CBC  Recent Labs Lab 05/30/17 0425 05/31/17 0346 06/01/17 0311  WBC 7.9 12.2* 10.0  HGB 11.5* 12.1 11.5*  HCT 34.8* 37.3 36.0  PLT 127* 132* 128*    Coag's  Recent Labs Lab 06/01/17 0311  INR 1.09    Sepsis Markers  Recent Labs Lab 05/29/17 0134 05/29/17 0549  LATICACIDVEN 1.65  --   PROCALCITON  --  0.22    ABG  Recent Labs Lab 05/29/17 0055 05/29/17 0157  PHART 7.164* 7.361  PCO2ART 98.5* 59.2*  PO2ART 151* 130.0*    Liver Enzymes  Recent Labs Lab 05/29/17 0127  AST 26  ALT 13*  ALKPHOS 88  BILITOT 0.9  ALBUMIN 3.3*    Cardiac Enzymes  Recent Labs Lab 05/29/17 0856 05/29/17 1429 05/30/17 0425  TROPONINI 0.84* 0.97* 0.88*    Glucose  Recent Labs Lab 05/30/17 1130 05/30/17 1538 05/30/17 2026 05/30/17 2349 05/31/17 0352 05/31/17 0747  GLUCAP 83 143* 151* 113* 106* 128*    Imaging No results found.  STUDIES:   CULTURES:  ANTIBIOTICS: Levaquin 10/9 >>  SIGNIFICANT EVENTS: 10/09  Admit  LINES/TUBES: ETT 10/8 >> 10/10  DISCUSSION: 66 year old female with known severe 2L O2 dependent GOLD IV COPD and known diastolic CHF admitted for mixed respiratory failure. Most likely due to pulmonary edema in the setting of decompensated heart failure, but the differential also includes COPD exacerbation and less likely community-acquired pneumonia.    ASSESSMENT / PLAN:  PULMONARY A: Acute on chronic hypoxemic/hypercarbic respiratory failure Pulmonary edema Possible acute exacerbation of COPD CAP Tobacco Abuse P:   Pulmonary hygiene - IS, mobilize  Continue Breo + Spiriva at discharge Q6 Duoneb  Smoking cessation counseling  Prednisone 60 mg QD, reduce by 10 mg every 3 days (dose change 10/12) Levaquin, D4/7 Will arrange for pulmonary clinic follow up > have sent a message to the office (phone  lines down) Attempt to arrange home BiPAP > may have to wait until sleep study completed   CARDIOVASCULAR A:  Acute on chronic diastolic heart failure NSTEMI - ECHO with new reduction in EF, new wall motion abnormality P:  Cardiology following, appreciate input  Pending LHC 10/12  RENAL A:   Hyponatremia P:   Lasix 40 mg IV QD Trend BMP / urinary output Replace electrolytes as indicated Avoid nephrotoxic agents, ensure adequate renal perfusion  GASTROINTESTINAL A:   History of GERD P:   Diet as tolerated once cath completed PPI   HEMATOLOGIC A:   No acute issues P:  Trend CBC  Heparin gtt per pharmacy   INFECTIOUS A:   CAP P:   Continue levaquin for 7 days Follow cultures as above  ENDOCRINE A:   Hyperglycemia with no history of diabetes mellitus P:   Monitor glucose on BMP  NEUROLOGIC A:   Acute metabolic encephalopathy - resolved P:   Ambulate with assistance   FAMILY  - Updates: Patient updated on plan of care 10/12.     To [facility] as of 10/12 am.  If pt to discharge in am, please notify PCCM.    [name], NP-C [facility] Pgr: [phone] or if no answer [phone] 06/01/2017, 8:35 AM  STAFF NOTE: I, Rory Percy, MD FACP have personally reviewed patient's available data, including medical history, events of note, physical examination and test results as part of my evaluation. I have discussed with resident/NP and other care providers such as pharmacist, RN and RRT. In addition, I personally evaluated patient and elicited key findings of: awake, post cath, no distress, jvd elevated slight, bruit noted, coarse distant mild BS, abdo soft, pcxr which I reviewed and past while here shows likely pulm edema and some hilar prominence on rt, sh enever had fevers or sputum production and with cath result would favor all this being pulm edema and cardiac in nature, would dc abx, she is not wheezing, would reduce steroids, she was neg  q.8 liters and would favor lasix maintaint to neg balance, she remains culture neg including viral panel, cvts assessment called , appears she is a candidate for cabg but needs clearance, will assess PFT, heparin per cards, asa, I updated pt in room   Mcarthur Rossetti. Tyson Alias, MD, FACP Pgr: [phone] [facility] 06/01/2017 5:55 PM

## 2017-06-01 NOTE — H&P (View-Only) (Signed)
Progress Note  Patient Name: Samantha Dyer Date of Encounter: 06/01/2017  Primary Cardiologist: [place]   Patient was successfully extubated 2 day ago . She is breathing comfortably sitting in a chair with nasal oxygen. Plan for right and left heart cath later today   Inpatient Medications    Scheduled Meds: . aspirin  81 mg Oral Daily  . atorvastatin  40 mg Oral q1800  . carvedilol  6.25 mg Oral BID WC  . furosemide  40 mg Intravenous Daily  . ipratropium-albuterol  3 mL Nebulization Q6H  . levofloxacin  750 mg Oral Daily  . mouth rinse  15 mL Mouth Rinse BID  . pantoprazole  40 mg Oral QHS  . predniSONE  60 mg Oral Q breakfast  . sodium chloride flush  3 mL Intravenous Q12H   Continuous Infusions: . sodium chloride 10 mL/hr at 06/01/17 0725  . sodium chloride    . heparin 800 Units/hr (06/01/17 0450)   PRN Meds: sodium chloride, sodium chloride flush   Vital Signs    Vitals:   06/01/17 0000 06/01/17 0606 06/01/17 0816 06/01/17 0900  BP: (!) 130/59 (!) 102/55 (!) 126/55   Pulse: 76 77 75   Resp: 20  18   Temp: 98.4 F (36.9 C) 98.1 F (36.7 C) 98.3 F (36.8 C)   TempSrc:  Oral Oral   SpO2: 99% 97% 99% 93%  Weight:  127 lb 8 oz (57.8 kg)    Height:        Intake/Output Summary (Last 24 hours) at 06/01/17 0958 Last data filed at 06/01/17 0918  Gross per 24 hour  Intake              469 ml  Output              925 ml  Net             -456 ml   Filed Weights   05/31/17 0459 05/31/17 2013 06/01/17 0606  Weight: 120 lb 5.9 oz (54.6 kg) 127 lb 1.6 oz (57.7 kg) 127 lb 8 oz (57.8 kg)    Telemetry    Normal sinus rhythm in the 90s - Personally Reviewed  ECG    Not performed today - Personally Reviewed  Physical Exam   GEN: No acute distress.   Neck: No JVD Cardiac: RRR, no murmurs, rubs, or gallops.  Respiratory: Clear to auscultation bilaterally.Successfully extubated yesterday GI: Soft, nontender, non-distended  Samantha: No edema; No  deformity. Neuro:  Nonfocal  Psych: Normal affect   Labs    Chemistry  Recent Labs Lab 05/29/17 0127 05/30/17 0425 05/30/17 1348 05/31/17 0346  NA 129* 130* 132* 133*  K 4.4 3.0* 4.4 3.8  CL 89* 90* 90* 89*  CO2 32 29 34* 36*  GLUCOSE 221* 137* 121* 107*  BUN 7 12 21* 16  CREATININE 0.79 0.84 0.91 0.79  CALCIUM 8.4* 8.6* 8.5* 8.7*  PROT 5.2*  --   --   --   ALBUMIN 3.3*  --   --   --   AST 26  --   --   --   ALT 13*  --   --   --   ALKPHOS 88  --   --   --   BILITOT 0.9  --   --   --   GFRNONAA >60 >60 >60 >60  GFRAA >60 >60 >60 >60  ANIONGAP '8 11 8 8     '$ Hematology  Recent Labs Lab 05/30/17 0425 05/31/17 0346 06/01/17 0311  WBC 7.9 12.2* 10.0  RBC 3.85* 4.02 3.84*  HGB 11.5* 12.1 11.5*  HCT 34.8* 37.3 36.0  MCV 90.4 92.8 93.8  MCH 29.9 30.1 29.9  MCHC 33.0 32.4 31.9  RDW 14.1 14.7 14.3  PLT 127* 132* 128*    Cardiac Enzymes  Recent Labs Lab 05/29/17 0549 05/29/17 0856 05/29/17 1429 05/30/17 0425  TROPONINI 0.59* 0.84* 0.97* 0.88*     Recent Labs Lab 05/29/17 0132  TROPIPOC 0.04     BNP  Recent Labs Lab 05/29/17 0127 05/29/17 0530  BNP 629.9* 1,600.6*     DDimer No results for input(s): DDIMER in the last 168 hours.   Radiology    No results found.  Cardiac Studies   2-D echocardiogram (05/29/17)  Study Conclusions  - Left ventricle: The cavity size was normal. There was mild focal   basal hypertrophy of the septum. Systolic function was mildly to   moderately reduced. The estimated ejection fraction was in the   range of 40% to 45%. There is severe hypokinesis of the   basal-midinferoseptal myocardium. There is akinesis of the   basal-midinferior myocardium. There is hypokinesis of the   basal-midanteroseptal myocardium. The study is not technically   sufficient to allow evaluation of LV diastolic function. - Aortic valve: Poorly visualized. - Mitral valve: Calcified annulus. Mildly thickened leaflets . - Left atrium:  The atrium was moderately dilated. - Atrial septum: There was increased thickness of the septum,   consistent with lipomatous hypertrophy. - Pulmonic valve: There was trivial regurgitation.  Patient Profile     66 y.o. female with ongoing tobacco abuse, COPD with multiple admissions for exacerbation requiring intubation. We saw her yesterday because of mildly elevated troponins and a 2-D echo that showed new LV dysfunction with segmental wall motion abnormalities. She does complain of "heartburn" over the last month which has been worrying her.  Assessment & Plan    1: Non-STEMI- Samantha Dyer was admitted on 05/28/17 with COPD exacerbation requiring intubation. She has had similar episodes in the past. Her enzymes were borderline positive. HER-2 D echo revealed new LV dysfunction with segmental wall motion analysis, and new since her previous echo. She has been complaining of indigestion over the last month. We will plan right and left heart cath Later today. The patient understands that risks included but are not limited to stroke (1 in 1000), death (1 in 65), kidney failure [usually temporary] (1 in 500), bleeding (1 in 200), allergic reaction [possibly serious] (1 in 200). The patient understands and agrees to proceed   2: LV dysfunction-LV dysfunction is new since prior echo in the 40-45% range with segmental wall motion analysis. Her BNP was elevated as well suggesting she has a heart failure component to her respiratory insufficiency. I/os are positive almost 9 L. She is being diuresed with Lasix 40 mg a day. Her fluid balance is -7 L with IV diuresis. She feels clinically improved.  3: Hypokalemia-resolved  Precatheterization orders have been written and the patient has been put on the scheduling board for tomorrow.   For questions or updates, please contact [place] Please consult [email or web address] for contact info under Cardiology/STEMI.      Angelina Sheriff, MD    06/01/2017, 9:58 AM

## 2017-06-01 NOTE — Progress Notes (Signed)
ANTICOAGULATION CONSULT NOTE - Follow Up Consult  Pharmacy Consult for Heparin  Indication: chest pain/ACS  Allergies  Allergen Reactions  . Augmentin [Amoxicillin-Pot Clavulanate] Swelling    Patient Measurements: Height:  (162.6 cm) Weight: 127 lb 1.6 oz (57.7 kg) IBW/kg (Calculated) : 54.7  Vital Signs: Temp: 98.4 F (36.9 C) (10/12 0000) Temp Source: Oral (10/11 2013) BP: 130/59 (10/12 0000) Pulse Rate: 76 (10/12 0000)  Labs:  Recent Labs  05/29/17 0856 05/29/17 1429  05/30/17 0425 05/30/17 1348 05/30/17 1929 05/31/17 0346 06/01/17 0311  HGB  --   --   < > 11.5*  --   --  12.1 11.5*  HCT  --   --   --  34.8*  --   --  37.3 36.0  PLT  --   --   --  127*  --   --  132* 128*  LABPROT  --   --   --   --   --   --   --  14.0  INR  --   --   --   --   --   --   --  1.09  HEPARINUNFRC  --   --   --   --   --  0.62 0.42 0.22*  CREATININE  --   --   --  0.84 0.91  --  0.79  --   TROPONINI 0.84* 0.97*  --  0.88*  --   --   --   --   < > = values in this interval not displayed.  Estimated Creatinine Clearance: 60.5 mL/min (by C-G formula based on SCr of 0.79 mg/dL).   Assessment: Heparin for NSTEMI, heparin level low this AM, no issues per RN, plan for cath today  Goal of Therapy:  Heparin level 0.3-0.7 units/ml Monitor platelets by anticoagulation protocol: Yes   Plan:  Inc heparin to 800 units/hr F/U post-cath  Abran Duke 06/01/2017,4:39 AM

## 2017-06-01 NOTE — Progress Notes (Signed)
Patient stated " I want to go home."  Patient is to go home with noninvasiveTrilogy ventilator  through [facility] - the machine offers portable volume and pressure support with BIPAP technology.. Patient with lots of questions about it; Waldemar Dickens and Brad with [facility] in to talk to her about the machine. All questions answered; Patient purchased her oxygen through [facility] and plans to go through [facility] for home oxygen; Patient will need qualifying sats documented to qualify for home oxygen through [facility]. ( Oxygen is needed to bleed through Trilogy ventilator) and orders for home oxygen.  Patient is agreeable for [facility] services, choice offered, pt chose [facility] / [facility]. CM following for DC needs; Alexis Goodell [phone]

## 2017-06-01 NOTE — Care Management Important Message (Signed)
Important Message  Patient Details  Name: Samantha Dyer MRN: [id number] Date of Birth: 1951-06-15   Medicare Important Message Given:  Yes    Ansel Ferrall Abena 06/01/2017, 10:56 AM

## 2017-06-01 NOTE — Progress Notes (Signed)
Changed order from BIPAP QHS to BIPAP PRN. Pt is stable and not in any distress. BIPAP readily available if pt needs it. RT to cont to monitor as needed. RN aware.

## 2017-06-02 ENCOUNTER — Inpatient Hospital Stay (HOSPITAL_COMMUNITY): Payer: Medicare Other

## 2017-06-02 DIAGNOSIS — E876 Hypokalemia: Secondary | ICD-10-CM

## 2017-06-02 DIAGNOSIS — I255 Ischemic cardiomyopathy: Secondary | ICD-10-CM

## 2017-06-02 DIAGNOSIS — E781 Pure hyperglyceridemia: Secondary | ICD-10-CM

## 2017-06-02 DIAGNOSIS — I5021 Acute systolic (congestive) heart failure: Secondary | ICD-10-CM

## 2017-06-02 LAB — BASIC METABOLIC PANEL
Anion gap: 8 (ref 5–15)
BUN: 16 mg/dL (ref 6–20)
CALCIUM: 8.4 mg/dL — AB (ref 8.9–10.3)
CO2: 35 mmol/L — ABNORMAL HIGH (ref 22–32)
CREATININE: 0.76 mg/dL (ref 0.44–1.00)
Chloride: 87 mmol/L — ABNORMAL LOW (ref 101–111)
GFR calc Af Amer: >60 mL/min (ref >60)
Glucose, Bld: 87 mg/dL (ref 65–99)
POTASSIUM: 3.1 mmol/L — AB (ref 3.5–5.1)
SODIUM: 130 mmol/L — AB (ref 135–145)

## 2017-06-02 LAB — CBC
HCT: 34.9 % — ABNORMAL LOW (ref 36.0–46.0)
HEMOGLOBIN: 11.4 g/dL — AB (ref 12.0–15.0)
MCH: 30.1 pg (ref 26.0–34.0)
MCHC: 32.7 g/dL (ref 30.0–36.0)
MCV: 92.1 fL (ref 78.0–100.0)
PLATELETS: 139 10*3/uL — AB (ref 150–400)
RBC: 3.79 MIL/uL — ABNORMAL LOW (ref 3.87–5.11)
RDW: 14 % (ref 11.5–15.5)
WBC: 7.1 10*3/uL (ref 4.0–10.5)

## 2017-06-02 LAB — HEPARIN LEVEL (UNFRACTIONATED)
HEPARIN UNFRACTIONATED: 0.23 [IU]/mL — AB (ref 0.30–0.70)
Heparin Unfractionated: 0.35 IU/mL (ref 0.30–0.70)

## 2017-06-02 LAB — MAGNESIUM: MAGNESIUM: 1.8 mg/dL (ref 1.7–2.4)

## 2017-06-02 MED ORDER — ATORVASTATIN CALCIUM 80 MG PO TABS
80.0000 mg | ORAL_TABLET | Freq: Every day | ORAL | Status: DC
  Administered 2017-06-02 – 2017-06-13 (×11): 80 mg via ORAL
  Filled 2017-06-02 (×11): qty 1

## 2017-06-02 MED ORDER — AMLODIPINE BESYLATE 5 MG PO TABS
5.0000 mg | ORAL_TABLET | Freq: Every day | ORAL | Status: DC
  Administered 2017-06-02 – 2017-06-05 (×4): 5 mg via ORAL
  Filled 2017-06-02 (×4): qty 1

## 2017-06-02 MED ORDER — POTASSIUM CHLORIDE CRYS ER 20 MEQ PO TBCR
40.0000 meq | EXTENDED_RELEASE_TABLET | Freq: Two times a day (BID) | ORAL | Status: AC
  Administered 2017-06-02 (×2): 40 meq via ORAL
  Filled 2017-06-02 (×2): qty 2

## 2017-06-02 MED ORDER — PANTOPRAZOLE SODIUM 40 MG PO TBEC
40.0000 mg | DELAYED_RELEASE_TABLET | Freq: Every day | ORAL | Status: DC
  Administered 2017-06-03 – 2017-06-06 (×4): 40 mg via ORAL
  Filled 2017-06-02 (×4): qty 1

## 2017-06-02 MED ORDER — BISOPROLOL FUMARATE 5 MG PO TABS
5.0000 mg | ORAL_TABLET | Freq: Every day | ORAL | Status: DC
  Administered 2017-06-02 – 2017-06-05 (×4): 5 mg via ORAL
  Filled 2017-06-02 (×4): qty 1

## 2017-06-02 NOTE — Progress Notes (Signed)
PULMONARY / CRITICAL CARE MEDICINE   Name: Samantha Dyer MRN: [id number] DOB: 08/27/1950    ADMISSION DATE:  05/28/2017 CONSULTATION DATE:  05/29/2017  REFERRING MD:  Dr. Blinda Leatherwood  CHIEF COMPLAINT:  Respiratory failure  BRIEF SUMMARY:  66 yowf   active smoker with history of 2L O2 dependent GOLD IV COPD and diastolic heart failure. She has been admitted and intubated in the past.  She called EMS in the late PM hours of 10/8 for dyspnea. Upon their arrival she was found to have O2 sats in the 50s and was cyanotic. She was treated with duoneb, magnesium, solumedrol and CPAP and only improved to O2 sats in 70s. She was intubated in ED. CXR consistent with pulmonary edema. Lasix was ordered. PCCM asked to admit.    SUBJECTIVE:  Sitting on side of bed / congested rattling cough on fvc on 2lpm np (baseline)   VITAL SIGNS: BP 117/61 (BP Location: Right Arm)   Pulse 71   Temp 98 F (36.7 C) (Oral)   Resp 18   Ht  (1.626 m)   Wt 128 lb 14.4 oz (58.5 kg)   SpO2 100%   BMI 22.13 kg/m   HEMODYNAMICS:    VENTILATOR SETTINGS: FiO2 (%):  [28 %] 28 %  INTAKE / OUTPUT: I/O last 3 completed shifts: In: 870.8 [P.O.:580; I.V.:290.8] Out: 1250 [Urine:1250]  PHYSICAL EXAMINATION: General: thin chronically ill appearing  wf appears slt > stated age   HEENT: MM pink/moist, no jvd PSY: calm/appropriate Neuro: AAOx4, speech clear, MAE  CV: s1s2 rrr, no m/r/g PULM: even/non-labored, coarse  insp and exp rhonchi bilaterally  WU:JWJX, non-tender, bsx4 active  Extremities: warm/dry, no edema  Skin: no rashes or lesions  LABS:  BMET  Recent Labs Lab 05/30/17 1348 05/31/17 0346 06/02/17 0239  NA 132* 133* 130*  K 4.4 3.8 3.1*  CL 90* 89* 87*  CO2 34* 36* 35*  BUN 21* 16 16  CREATININE 0.91 0.79 0.76  GLUCOSE 121* 107* 87    Electrolytes  Recent Labs Lab 05/29/17 0549 05/30/17 0425 05/30/17 1348 05/31/17 0346 06/02/17 0239  CALCIUM  --  8.6* 8.5* 8.7* 8.4*  MG 2.2 1.9   --   --  1.8  PHOS 2.5 3.3  --   --   --     CBC  Recent Labs Lab 05/31/17 0346 06/01/17 0311 06/02/17 0239  WBC 12.2* 10.0 7.1  HGB 12.1 11.5* 11.4*  HCT 37.3 36.0 34.9*  PLT 132* 128* 139*    Coag's  Recent Labs Lab 06/01/17 0311  INR 1.09    Sepsis Markers  Recent Labs Lab 05/29/17 0134 05/29/17 0549  LATICACIDVEN 1.65  --   PROCALCITON  --  0.22    ABG  Recent Labs Lab 05/29/17 0055 05/29/17 0157 06/01/17 1241  PHART 7.164* 7.361 7.368  PCO2ART 98.5* 59.2* 72.2*  PO2ART 151* 130.0* 62.0*    Liver Enzymes  Recent Labs Lab 05/29/17 0127  AST 26  ALT 13*  ALKPHOS 88  BILITOT 0.9  ALBUMIN 3.3*    Cardiac Enzymes  Recent Labs Lab 05/29/17 0856 05/29/17 1429 05/30/17 0425  TROPONINI 0.84* 0.97* 0.88*    Glucose  Recent Labs Lab 05/30/17 1130 05/30/17 1538 05/30/17 2026 05/30/17 2349 05/31/17 0352 05/31/17 0747  GLUCAP 83 143* 151* 113* 106* 128*    Imaging Dg Chest Port 1 View  Result Date: 06/02/2017 CLINICAL DATA:  Congestive heart failure. EXAM: PORTABLE CHEST 1 VIEW COMPARISON:  Radiograph of May 30, 2017. FINDINGS: Stable cardiomediastinal silhouette. Atherosclerosis of thoracic aorta is noted. No pneumothorax or pleural effusion is noted. Minimal interstitial densities are noted in both lung bases which may represent minimal pulmonary edema. Bony thorax is unremarkable. IMPRESSION: Possible minimal bibasilar pulmonary edema.  Aortic atherosclerosis. Electronically Signed   By: Lupita Raider, M.D.   On: 06/02/2017 08:09    STUDIES:   CULTURES: BC x 2  10/9  >>> Resp viral panel 10/10 > neg   ANTIBIOTICS: Levaquin 10/9  10/12   SIGNIFICANT EVENTS: 10/09  Admit  LINES/TUBES: ETT 10/8 >> 10/10  DISCUSSION: 66 year old female with known severe 2L O2 dependent GOLD IV COPD and known diastolic CHF admitted for mixed respiratory failure. Most likely due to pulmonary edema in the setting of decompensated heart  failure, but the differential also includes COPD exacerbation and less likely community-acquired pneumonia.    ASSESSMENT / PLAN:  PULMONARY A: Acute on chronic hypoxemic/hypercarbic respiratory failure Pulmonary edema Possible acute exacerbation of COPD ? CAP > strongly doubt Cigarette use ongoing P:   Pulmonary hygiene - IS, mobilize  Continue Breo + Spiriva at discharge Q6 Duoneb prn  Smoking cessation counseling  - > 3 min discussion  I emphasized that although we never turn away smokers from the pulmonary clinic, we do ask that they understand that the recommendations that we make  won't work nearly as well in the presence of continued cigarette exposure.  In fact, we may very well  reach a point where we can't promise to help the patient if he/she can't quit smoking. (We can and will promise to try to help, we just can't promise what we recommend will really work)  Prednisone 60 mg QD, reduce by 10 mg every 3 days (dose change 10/12)   Will arrange for pulmonary clinic follow up > have sent a message to the office (phone lines down) ?? Needs  home BiPAP > may have to wait until sleep study completed   CARDIOVASCULAR A:  Acute on chronic diastolic heart failure NSTEMI - ECHO with new reduction in EF, new wall motion abnormality P:  Per cards/ T  Surgery - would be careful with coreg use here : Strongly prefer in this setting: Bystolic, the most beta -1  selective Beta blocker available in sample form, with bisoprolol the most selective generic choice  on the market.   RENAL A:   Hyponatremia P:   Lasix 40 mg IV QD Trend BMP / urinary output Replace electrolytes as indicated Avoid nephrotoxic agents, ensure adequate renal perfusion  GASTROINTESTINAL A:   History of GERD P:   Diet as tolerated once cath completed PPI   HEMATOLOGIC A:   No acute issues P:  Trend CBC  Heparin gtt per pharmacy /cards  INFECTIOUS A:   - no evidence cap/ off levaquin 10/12    ENDOCRINE A:   Hyperglycemia with no history of diabetes mellitus P:   Monitor glucose on BMP  NEUROLOGIC A:   Acute metabolic encephalopathy - resolved P:   Ambulate with assistance     Not sure about rational for checking pfts during acute flare as may not accurately her baseline but on the other hand probably will help with timing of cabg consideration eg now (doubt) very much later which would favor here if ok with cards/t surgery - in either case she would need be at very high risk and willing to accept those risks     Sandrea Hughs, MD Pulmonary and Critical Care  Medicine [facility] Cell [phone] After 5:30 PM or weekends, use [facility] [phone]

## 2017-06-02 NOTE — Progress Notes (Signed)
Progress Note  Patient Name: Samantha Dyer Date of Encounter: 06/02/2017  Primary Cardiologist: Allyson Sabal  Subjective   No recurrent "indigestion" which is her anginal equivalent.  No desire to quit tobacco.  Inpatient Medications    Scheduled Meds: . aspirin  81 mg Oral Daily  . atorvastatin  40 mg Oral q1800  . carvedilol  6.25 mg Oral BID WC  . fluticasone furoate-vilanterol  1 puff Inhalation Daily  . furosemide  40 mg Intravenous Daily  . ipratropium-albuterol  3 mL Nebulization Q6H  . mouth rinse  15 mL Mouth Rinse BID  . [START ON 06/03/2017] pantoprazole  40 mg Oral QAC breakfast  . potassium chloride  40 mEq Oral BID  . predniSONE  40 mg Oral Q breakfast  . sodium chloride flush  3 mL Intravenous Q12H  . tiotropium  18 mcg Inhalation Daily   Continuous Infusions: . sodium chloride    . heparin 900 Units/hr (06/02/17 0824)   PRN Meds: sodium chloride, acetaminophen, ondansetron (ZOFRAN) IV, sodium chloride flush   Vital Signs    Vitals:   06/02/17 0435 06/02/17 0817 06/02/17 0820 06/02/17 0821  BP: 137/68 117/61    Pulse: 93 71    Resp: 18 18    Temp: 98 F (36.7 C) 98 F (36.7 C)    TempSrc: Oral Oral    SpO2: 98% 100% 100% 100%  Weight: 128 lb 14.4 oz (58.5 kg)     Height:        Intake/Output Summary (Last 24 hours) at 06/02/17 1139 Last data filed at 06/02/17 0824  Gross per 24 hour  Intake            590.5 ml  Output              925 ml  Net           -334.5 ml    I/O since admission: -7311   Filed Weights   05/31/17 2013 06/01/17 0606 06/02/17 0435  Weight: 127 lb 1.6 oz (57.7 kg) 127 lb 8 oz (57.8 kg) 128 lb 14.4 oz (58.5 kg)    Telemetry    Sinus in 70s - Personally Reviewed  ECG    ECG (independently read by me): ST at 110 with LVH and T wave abnormalities  Physical Exam   BP 117/61 (BP Location: Right Arm)   Pulse 71   Temp 98 F (36.7 C) (Oral)   Resp 18   Ht  (1.626 m)   Wt 128 lb 14.4 oz (58.5 kg)   SpO2 100%    BMI 22.13 kg/m  General: Alert, oriented, no distress.  Skin: normal turgor, no rashes, warm and dry HEENT: Normocephalic, atraumatic. Pupils equal round and reactive to light; sclera anicteric; extraocular muscles intact; Nose without nasal septal hypertrophy Mouth/Parynx benign; Mallinpatti scale 3 Neck: No JVD, no carotid bruits; normal carotid upstroke Lungs: diffusely decreased BS Chest wall: without tenderness to palpitation Heart: PMI not displaced, RRR, s1 s2 normal, 1/6 systolic murmur, no diastolic murmur, no rubs, gallops, thrills, or heaves Abdomen: soft, nontender; no hepatosplenomehaly, BS+; abdominal aorta nontender and not dilated by palpation. Back: no CVA tenderness Pulses 2+ R radial site stable Musculoskeletal: full range of motion, normal strength, no joint deformities Extremities: no clubbing cyanosis or edema, Homan's sign negative  Neurologic: grossly nonfocal; Cranial nerves grossly wnl Psychologic: Normal mood and affect   Labs    Chemistry Recent Labs Lab 05/29/17 0127  05/30/17 1348 05/31/17 0346 06/02/17  0239  NA 129*  < > 132* 133* 130*  K 4.4  < > 4.4 3.8 3.1*  CL 89*  < > 90* 89* 87*  CO2 32  < > 34* 36* 35*  GLUCOSE 221*  < > 121* 107* 87  BUN 7  < > 21* 16 16  CREATININE 0.79  < > 0.91 0.79 0.76  CALCIUM 8.4*  < > 8.5* 8.7* 8.4*  PROT 5.2*  --   --   --   --   ALBUMIN 3.3*  --   --   --   --   AST 26  --   --   --   --   ALT 13*  --   --   --   --   ALKPHOS 88  --   --   --   --   BILITOT 0.9  --   --   --   --   GFRNONAA >60  < > >60 >60 >60  GFRAA >60  < > >60 >60 >60  ANIONGAP 8  < > < > = values in this interval not displayed.   Hematology Recent Labs Lab 05/31/17 0346 06/01/17 0311 06/02/17 0239  WBC 12.2* 10.0 7.1  RBC 4.02 3.84* 3.79*  HGB 12.1 11.5* 11.4*  HCT 37.3 36.0 34.9*  MCV 92.8 93.8 92.1  MCH 30.1 29.9 30.1  MCHC 32.4 31.9 32.7  RDW 14.7 14.3 14.0  PLT 132* 128* 139*    Cardiac Enzymes Recent  Labs Lab 05/29/17 0549 05/29/17 0856 05/29/17 1429 05/30/17 0425  TROPONINI 0.59* 0.84* 0.97* 0.88*    Recent Labs Lab 05/29/17 0132  TROPIPOC 0.04     BNP Recent Labs Lab 05/29/17 0127 05/29/17 0530  BNP 629.9* 1,600.6*     DDimer No results for input(s): DDIMER in the last 168 hours.   Lipid Panel     Component Value Date/Time   CHOL 196 05/31/2017 0346   TRIG 104 06/01/2017 0311   HDL 63 05/31/2017 0346   CHOLHDL 3.1 05/31/2017 0346   VLDL 17 05/31/2017 0346   LDLCALC 116 (H) 05/31/2017 0346    Radiology    Dg Chest Port 1 View  Result Date: 06/02/2017 CLINICAL DATA:  Congestive heart failure. EXAM: PORTABLE CHEST 1 VIEW COMPARISON:  Radiograph of May 30, 2017. FINDINGS: Stable cardiomediastinal silhouette. Atherosclerosis of thoracic aorta is noted. No pneumothorax or pleural effusion is noted. Minimal interstitial densities are noted in both lung bases which may represent minimal pulmonary edema. Bony thorax is unremarkable. IMPRESSION: Possible minimal bibasilar pulmonary edema.  Aortic atherosclerosis. Electronically Signed   By: Lupita Raider, M.D.   On: 06/02/2017 08:09    Cardiac Studies   2-D echocardiogram (05/29/17)  Study Conclusions  - Left ventricle: The cavity size was normal. There was mild focal basal hypertrophy of the septum. Systolic function was mildly to moderately reduced. The estimated ejection fraction was in the range of 40% to 45%. There is severe hypokinesis of the basal-midinferoseptal myocardium. There is akinesis of the basal-midinferior myocardium. There is hypokinesis of the basal-midanteroseptal myocardium. The study is not technically sufficient to allow evaluation of LV diastolic function. - Aortic valve: Poorly visualized. - Mitral valve: Calcified annulus. Mildly thickened leaflets . - Left atrium: The atrium was moderately dilated. - Atrial septum: There was increased thickness of the  septum, consistent with lipomatous hypertrophy. - Pulmonic valve: There was trivial regurgitation.   Cardiac Cath Conclusion  Ost LAD lesion, 90 %stenosed. Complex, calcified trifurcation lesion with ramus.  Ost Ramus lesion, 90 %stenosed. The ramus has two large branches which cover the lateral wall. The circumflex is relatively small.  Ost RCA to Prox RCA lesion, 100 %stenosed. There are left to right collaterals.  Prox Cx lesion, 70 %stenosed.  LV end diastolic pressure is normal.  The left ventricular ejection fraction is 35-45% by visual estimate.  There is no aortic valve stenosis.  Normal PA pressures; Ao 89%, PA 70%, CO 7.2 L/min; CI 4.4   Severe three vessel CAD.  Plan for CVTS consult for CABG.   Restart heparin in 6 hours.           Patient Profile   66 y.o. female with ongoing tobacco abuse, COPD with multiple admissions for exacerbation requiring intubation. We saw her yesterday because of mildly elevated troponins and a 2-D echo that showed new LV dysfunction with segmental wall motion abnormalities. She does complain of "heartburn" over the last month which has been worrying her.  Assessment & Plan   1. NSTEMI; no furhther symptoms; severe MV CAD 2. Multivessel CAD with LM equivalence and total RCA.  Needs surgery, but awaiting further pulmonary assessment.  Will add amlodipine and change BB for cardioselectivity. 3. Ischemic cardiomyopathy 4. Gold IV COPD; will change carvedilol to bisoprolol for improved cardioselectivity; on prednisine taper, atropine, etc 5. Hyperlipidemia;  Increase atorvastatin to 80 mg. 6. HypoK 3.1; will replete to 4; check Mg 7. Ongoing Tobacco; discussed smoking cessation  Signed, Lennette Bihari, MD, [facility] 06/02/2017, 11:39 AM

## 2017-06-02 NOTE — Progress Notes (Signed)
ANTICOAGULATION CONSULT NOTE - Follow Up Consult  Pharmacy Consult for Heparin  Indication: chest pain/ACS, s/p cath, possible CABG   Allergies  Allergen Reactions  . Augmentin [Amoxicillin-Pot Clavulanate] Swelling    Patient Measurements: Height:  (162.6 cm) Weight: 127 lb 8 oz (57.8 kg) (b scale) IBW/kg (Calculated) : 54.7  Vital Signs: Temp: 98 F (36.7 C) (10/13 0139) Temp Source: Oral (10/13 0139) BP: 116/58 (10/13 0139) Pulse Rate: 89 (10/13 0139)  Labs:  Recent Labs  05/30/17 0425 05/30/17 1348  05/31/17 0346 06/01/17 0311 06/02/17 0239  HGB 11.5*  --   --  12.1 11.5* 11.4*  HCT 34.8*  --   --  37.3 36.0 34.9*  PLT 127*  --   --  132* 128* 139*  LABPROT  --   --   --   --  14.0  --   INR  --   --   --   --  1.09  --   HEPARINUNFRC  --   --   < > 0.42 0.22* 0.23*  CREATININE 0.84 0.91  --  0.79  --   --   TROPONINI 0.88*  --   --   --   --   --   < > = values in this interval not displayed.  Estimated Creatinine Clearance: 60.5 mL/min (by C-G formula based on SCr of 0.79 mg/dL).   Assessment: Heparin while waiting for pulmonary clearance for CABG, heparin level low this AM, no issues per RN.   Goal of Therapy:  Heparin level 0.3-0.7 units/ml Monitor platelets by anticoagulation protocol: Yes   Plan:  Inc heparin to 900 units/hr 1300 HL  Verenice Westrich 06/02/2017,4:15 AM

## 2017-06-02 NOTE — Progress Notes (Signed)
ANTICOAGULATION CONSULT NOTE - Follow Up Consult  Pharmacy Consult for Heparin  Indication: chest pain/ACS, s/p cath, possible CABG   Allergies  Allergen Reactions  . Augmentin [Amoxicillin-Pot Clavulanate] Swelling    Patient Measurements: Height:  (162.6 cm) Weight: 128 lb 14.4 oz (58.5 kg) IBW/kg (Calculated) : 54.7  Vital Signs: Temp: 98 F (36.7 C) (10/13 1255) Temp Source: Oral (10/13 1255) BP: 119/56 (10/13 1255) Pulse Rate: 76 (10/13 1255)  Labs:  Recent Labs  05/31/17 0346 06/01/17 0311 06/02/17 0239 06/02/17 1219  HGB 12.1 11.5* 11.4*  --   HCT 37.3 36.0 34.9*  --   PLT 132* 128* 139*  --   LABPROT  --  14.0  --   --   INR  --  1.09  --   --   HEPARINUNFRC 0.42 0.22* 0.23* 0.35  CREATININE 0.79  --  0.76  --     Estimated Creatinine Clearance: 60.5 mL/min (by C-G formula based on SCr of 0.76 mg/dL).   Assessment: 66 yof continuing on heparin while waiting for pulmonary clearance for CABG, heparin level now therapeutic after rate increase. CBC low but stable. No bleed documented.   Goal of Therapy:  Heparin level 0.3-0.7 units/ml Monitor platelets by anticoagulation protocol: Yes   Plan:  Continue heparin at 900 units/hr Daily heparin level/CBC Monitor for s/sx bleeding  Babs Bertin, PharmD, BCPS Clinical Pharmacist Rx Phone # for today: [phone] After 3:30PM, please call Main Rx: [phone] 06/02/2017 2:43 PM

## 2017-06-02 NOTE — Progress Notes (Signed)
Patient ID: Samantha Dyer, female   DOB: [birth year], 66 y.o.   MRN: [id number]  PROGRESS NOTE    Mayo Owczarzak  WUJ:[id number] DOB: 1951-04-01 DOA: 05/28/2017 PCP: No primary care provider on file.   Brief Narrative:  66 year old female with history of ongoing tobacco abuse, chronic hypoxic respiratory failure on home oxygen, COPD Gold IV and diastolic heart failure was admitted on 05/28/2017 for respiratory failure and was intubated for probable acute COPD exacerbation along with acute on chronic diastolic heart failure with possibility for pneumonia and started on IV diuretics, steroids and antibiotics. Patient was subsequently extubated. IV Solu-Medrol was switched to oral prednisone. She was found to have EF of 40-45%. Cardiology was consulted. Patient had cardiac catheterization on 06/01/2017 and was found to have severe multivessel coronary artery disease. Cardiac thoracic surgery was consulted. Patient was transferred out to the floor and hospitalist service took over the care from 06/02/2017.  Assessment & Plan:   Active Problems:   Acute on chronic respiratory failure with hypoxia and hypercapnia (HCC)   COPD exacerbation (HCC)   Acute respiratory failure with hypoxia and hypercapnia (HCC)   Acute diastolic (congestive) heart failure (HCC)   Hyponatremia   Non-ST elevation (NSTEMI) myocardial infarction (HCC)   Systolic congestive heart failure (HCC)   Ischemic cardiomyopathy   Hypokalemia   Pure hyperglyceridemia  Acute on chronic hypoxic/hypercarbic respiratory failure - Status post extubation. Continue oxygen supplementation. Pulmonary following  COPD exacerbation - Continue DuoNeb and Breo and Spiriva. Continue prednisone taper - Antibiotics have been discontinued by pulmonary as they don't think that patient had pneumonia  Multivessel coronary artery disease with non-STEMI - Cardiology and cardiaothoracic surgery following - Continue aspirin, statin and biceps upper  lobe   Acute on chronic systolic and diastolic heart failure - Patient has new systolic heart failure with ejection fraction of 40-45%. Continue Lasix. Strict Input and output; daily weights - Cardiology following  Chronic tobacco abuse - Consult about tobacco cessation.   Hyponatremia - From volume overload. Monitor  Hypokalemia - Replace and repeat a.m. Labs  Hyperlipidemia - Continue statin   DVT prophylaxis: Heparin Code Status:  Full Family Communication: Spoke to husband and other family members present at bedside Disposition Plan: Depends on clinical outcome  Consultants: Cardiology, cardiothoracic surgery, PCCM  Procedures:  2-D echocardiogram (05/29/17)  Study Conclusions  - Left ventricle: The cavity size was normal. There was mild focal basal hypertrophy of the septum. Systolic function was mildly to moderately reduced. The estimated ejection fraction was in the range of 40% to 45%. There is severe hypokinesis of the basal-midinferoseptal myocardium. There is akinesis of the basal-midinferior myocardium. There is hypokinesis of the basal-midanteroseptal myocardium. The study is not technically sufficient to allow evaluation of LV diastolic function. - Aortic valve: Poorly visualized. - Mitral valve: Calcified annulus. Mildly thickened leaflets . - Left atrium: The atrium was moderately dilated. - Atrial septum: There was increased thickness of the septum, consistent with lipomatous hypertrophy. - Pulmonic valve: There was trivial regurgitation.   Cardiac Cath Conclusion 06/01/2017    Ost LAD lesion, 90 %stenosed. Complex, calcified trifurcation lesion with ramus.  Ost Ramus lesion, 90 %stenosed. The ramus has two large branches which cover the lateral wall. The circumflex is relatively small.  Ost RCA to Prox RCA lesion, 100 %stenosed. There are left to right collaterals.  Prox Cx lesion, 70 %stenosed.  LV end diastolic  pressure is normal.  The left ventricular ejection fraction is 35-45% by visual estimate.  There is  no aortic valve stenosis.  Normal PA pressures; Ao 89%, PA 70%, CO 7.2 L/min; CI 4.4  Severe three vessel CAD. Plan for CVTS consult for CABG.      Antimicrobials: Levaquin from 05/28/17 - 06/01/2017   Subjective: Patient seen and examined at bedside. She wants to go home. No overnight fever, nausea or vomiting. She is short of breath with exertion.  Objective: Vitals:   06/02/17 0435 06/02/17 0817 06/02/17 0820 06/02/17 0821  BP: 137/68 117/61    Pulse: 93 71    Resp: 18 18    Temp: 98 F (36.7 C) 98 F (36.7 C)    TempSrc: Oral Oral    SpO2: 98% 100% 100% 100%  Weight: 58.5 kg (128 lb 14.4 oz)     Height:        Intake/Output Summary (Last 24 hours) at 06/02/17 1211 Last data filed at 06/02/17 0824  Gross per 24 hour  Intake            590.5 ml  Output              925 ml  Net           -334.5 ml   Filed Weights   05/31/17 2013 06/01/17 0606 06/02/17 0435  Weight: 57.7 kg (127 lb 1.6 oz) 57.8 kg (127 lb 8 oz) 58.5 kg (128 lb 14.4 oz)    Examination:  General exam: Appears calm and comfortable  Respiratory system: Bilateral decreased breath sound at bases With basilar crackles Cardiovascular system: S1 & S2 heard, rate controlled  Gastrointestinal system: Abdomen is nondistended, soft and nontender. Normal bowel sounds heard. Extremities: No cyanosis, clubbing, trace edema   Data Reviewed: I have personally reviewed following labs and imaging studies  CBC:  Recent Labs Lab 05/29/17 0127 05/30/17 0425 05/31/17 0346 06/01/17 0311 06/02/17 0239  WBC 15.0* 7.9 12.2* 10.0 7.1  NEUTROABS 14.1*  --   --   --   --   HGB 12.1 11.5* 12.1 11.5* 11.4*  HCT 37.5 34.8* 37.3 36.0 34.9*  MCV 93.8 90.4 92.8 93.8 92.1  PLT 165 127* 132* 128* 139*   Basic Metabolic Panel:  Recent Labs Lab 05/29/17 0127 05/29/17 0549 05/30/17 0425 05/30/17 1348  05/31/17 0346 06/02/17 0239  NA 129*  --  130* 132* 133* 130*  K 4.4  --  3.0* 4.4 3.8 3.1*  CL 89*  --  90* 90* 89* 87*  CO2 32  --  29 34* 36* 35*  GLUCOSE 221*  --  137* 121* 107* 87  BUN 7  --  12 21* 16 16  CREATININE 0.79  --  0.84 0.91 0.79 0.76  CALCIUM 8.4*  --  8.6* 8.5* 8.7* 8.4*  MG  --  2.2 1.9  --   --  1.8  PHOS  --  2.5 3.3  --   --   --    GFR: Estimated Creatinine Clearance: 60.5 mL/min (by C-G formula based on SCr of 0.76 mg/dL). Liver Function Tests:  Recent Labs Lab 05/29/17 0127  AST 26  ALT 13*  ALKPHOS 88  BILITOT 0.9  PROT 5.2*  ALBUMIN 3.3*   No results for input(s): LIPASE, AMYLASE in the last 168 hours. No results for input(s): AMMONIA in the last 168 hours. Coagulation Profile:  Recent Labs Lab 06/01/17 0311  INR 1.09   Cardiac Enzymes:  Recent Labs Lab 05/29/17 0549 05/29/17 0856 05/29/17 1429 05/30/17 0425  TROPONINI 0.59* 0.84* 0.97* 0.88*  BNP (last 3 results) No results for input(s): PROBNP in the last 8760 hours. HbA1C: No results for input(s): HGBA1C in the last 72 hours. CBG:  Recent Labs Lab 05/30/17 1538 05/30/17 2026 05/30/17 2349 05/31/17 0352 05/31/17 0747  GLUCAP 143* 151* 113* 106* 128*   Lipid Profile:  Recent Labs  05/31/17 0346 06/01/17 0311  CHOL 196  --   HDL 63  --   LDLCALC 116*  --   TRIG 84 104  CHOLHDL 3.1  --    Thyroid Function Tests: No results for input(s): TSH, T4TOTAL, FREET4, T3FREE, THYROIDAB in the last 72 hours. Anemia Panel: No results for input(s): VITAMINB12, FOLATE, FERRITIN, TIBC, IRON, RETICCTPCT in the last 72 hours. Sepsis Labs:  Recent Labs Lab 05/29/17 0134 05/29/17 0549  PROCALCITON  --  0.22  LATICACIDVEN 1.65  --     Recent Results (from the past 240 hour(s))  MRSA PCR Screening     Status: None   Collection Time: 05/29/17  5:39 AM  Result Value Ref Range Status   MRSA by PCR NEGATIVE NEGATIVE Final    Comment:        The GeneXpert MRSA Assay  (FDA approved for NASAL specimens only), is one component of a comprehensive MRSA colonization surveillance program. It is not intended to diagnose MRSA infection nor to guide or monitor treatment for MRSA infections.   Culture, blood (routine x 2)     Status: None (Preliminary result)   Collection Time: 05/29/17  5:49 AM  Result Value Ref Range Status   Specimen Description BLOOD LEFT HAND  Final   Special Requests   Final    BOTTLES DRAWN AEROBIC AND ANAEROBIC Blood Culture adequate volume   Culture NO GROWTH 3 DAYS  Final   Report Status PENDING  Incomplete  Culture, blood (routine x 2)     Status: None (Preliminary result)   Collection Time: 05/29/17  5:55 AM  Result Value Ref Range Status   Specimen Description BLOOD RIGHT HAND  Final   Special Requests   Final    BOTTLES DRAWN AEROBIC ONLY Blood Culture adequate volume   Culture NO GROWTH 3 DAYS  Final   Report Status PENDING  Incomplete  Respiratory Panel by PCR     Status: None   Collection Time: 05/30/17 11:31 AM  Result Value Ref Range Status   Adenovirus NOT DETECTED NOT DETECTED Final   Coronavirus 229E NOT DETECTED NOT DETECTED Final   Coronavirus HKU1 NOT DETECTED NOT DETECTED Final   Coronavirus NL63 NOT DETECTED NOT DETECTED Final   Coronavirus OC43 NOT DETECTED NOT DETECTED Final   Metapneumovirus NOT DETECTED NOT DETECTED Final   Rhinovirus / Enterovirus NOT DETECTED NOT DETECTED Final   Influenza A NOT DETECTED NOT DETECTED Final   Influenza B NOT DETECTED NOT DETECTED Final   Parainfluenza Virus 1 NOT DETECTED NOT DETECTED Final   Parainfluenza Virus 2 NOT DETECTED NOT DETECTED Final   Parainfluenza Virus 3 NOT DETECTED NOT DETECTED Final   Parainfluenza Virus 4 NOT DETECTED NOT DETECTED Final   Respiratory Syncytial Virus NOT DETECTED NOT DETECTED Final   Bordetella pertussis NOT DETECTED NOT DETECTED Final   Chlamydophila pneumoniae NOT DETECTED NOT DETECTED Final   Mycoplasma pneumoniae NOT DETECTED  NOT DETECTED Final         Radiology Studies: Dg Chest Port 1 View  Result Date: 06/02/2017 CLINICAL DATA:  Congestive heart failure. EXAM: PORTABLE CHEST 1 VIEW COMPARISON:  Radiograph of May 30, 2017. FINDINGS:  Stable cardiomediastinal silhouette. Atherosclerosis of thoracic aorta is noted. No pneumothorax or pleural effusion is noted. Minimal interstitial densities are noted in both lung bases which may represent minimal pulmonary edema. Bony thorax is unremarkable. IMPRESSION: Possible minimal bibasilar pulmonary edema.  Aortic atherosclerosis. Electronically Signed   By: Lupita Raider, M.D.   On: 06/02/2017 08:09        Scheduled Meds: . amLODipine  5 mg Oral Daily  . aspirin  81 mg Oral Daily  . atorvastatin  80 mg Oral q1800  . bisoprolol  5 mg Oral Daily  . fluticasone furoate-vilanterol  1 puff Inhalation Daily  . furosemide  40 mg Intravenous Daily  . ipratropium-albuterol  3 mL Nebulization Q6H  . mouth rinse  15 mL Mouth Rinse BID  . [START ON 06/03/2017] pantoprazole  40 mg Oral QAC breakfast  . potassium chloride  40 mEq Oral BID  . predniSONE  40 mg Oral Q breakfast  . sodium chloride flush  3 mL Intravenous Q12H  . tiotropium  18 mcg Inhalation Daily   Continuous Infusions: . sodium chloride    . heparin 900 Units/hr (06/02/17 0824)     LOS: 4 days        Glade Lloyd, MD [facility] Pager [phone]  If 7PM-7AM, please contact night-coverage [email or web address] Password [email or web address] 06/02/2017, 12:11 PM

## 2017-06-02 NOTE — Progress Notes (Signed)
Pt has old bipap order.  Rt will monitor. 

## 2017-06-03 LAB — CBC
HEMATOCRIT: 31.7 % — AB (ref 36.0–46.0)
HEMOGLOBIN: 10.6 g/dL — AB (ref 12.0–15.0)
MCH: 30.5 pg (ref 26.0–34.0)
MCHC: 33.4 g/dL (ref 30.0–36.0)
MCV: 91.4 fL (ref 78.0–100.0)
Platelets: 121 10*3/uL — ABNORMAL LOW (ref 150–400)
RBC: 3.47 MIL/uL — ABNORMAL LOW (ref 3.87–5.11)
RDW: 13.6 % (ref 11.5–15.5)
WBC: 5.4 10*3/uL (ref 4.0–10.5)

## 2017-06-03 LAB — CULTURE, BLOOD (ROUTINE X 2)
CULTURE: NO GROWTH
CULTURE: NO GROWTH
SPECIAL REQUESTS: ADEQUATE
Special Requests: ADEQUATE

## 2017-06-03 LAB — BASIC METABOLIC PANEL
Anion gap: 5 (ref 5–15)
BUN: 13 mg/dL (ref 6–20)
CHLORIDE: 87 mmol/L — AB (ref 101–111)
CO2: 37 mmol/L — AB (ref 22–32)
CREATININE: 0.74 mg/dL (ref 0.44–1.00)
Calcium: 8.6 mg/dL — ABNORMAL LOW (ref 8.9–10.3)
GFR calc Af Amer: >60 mL/min (ref >60)
GFR calc non Af Amer: >60 mL/min (ref >60)
Glucose, Bld: 95 mg/dL (ref 65–99)
Potassium: 3.8 mmol/L (ref 3.5–5.1)
SODIUM: 129 mmol/L — AB (ref 135–145)

## 2017-06-03 LAB — HEPARIN LEVEL (UNFRACTIONATED): Heparin Unfractionated: 0.41 IU/mL (ref 0.30–0.70)

## 2017-06-03 LAB — MAGNESIUM: Magnesium: 1.8 mg/dL (ref 1.7–2.4)

## 2017-06-03 MED ORDER — SENNOSIDES-DOCUSATE SODIUM 8.6-50 MG PO TABS
1.0000 | ORAL_TABLET | Freq: Two times a day (BID) | ORAL | Status: DC
  Administered 2017-06-03 – 2017-06-04 (×4): 1 via ORAL
  Filled 2017-06-03 (×5): qty 1

## 2017-06-03 MED ORDER — LOSARTAN POTASSIUM 25 MG PO TABS
25.0000 mg | ORAL_TABLET | Freq: Every day | ORAL | Status: DC
  Administered 2017-06-03 – 2017-06-05 (×3): 25 mg via ORAL
  Filled 2017-06-03 (×3): qty 1

## 2017-06-03 MED ORDER — IPRATROPIUM-ALBUTEROL 0.5-2.5 (3) MG/3ML IN SOLN
3.0000 mL | Freq: Three times a day (TID) | RESPIRATORY_TRACT | Status: DC
  Administered 2017-06-04 (×2): 3 mL via RESPIRATORY_TRACT
  Filled 2017-06-03 (×2): qty 3

## 2017-06-03 MED ORDER — FUROSEMIDE 40 MG PO TABS
40.0000 mg | ORAL_TABLET | Freq: Every day | ORAL | Status: DC
  Administered 2017-06-03 – 2017-06-05 (×3): 40 mg via ORAL
  Filled 2017-06-03 (×3): qty 1

## 2017-06-03 NOTE — Progress Notes (Signed)
PULMONARY / CRITICAL CARE MEDICINE   Name: Samantha Dyer MRN: [id number] DOB: March 25, 1951    ADMISSION DATE:  05/28/2017 CONSULTATION DATE:  05/29/2017  REFERRING MD:  Dr. Blinda Leatherwood  CHIEF COMPLAINT:  Respiratory failure  BRIEF SUMMARY:  66 yowf   active smoker with history of 2L O2 dependent GOLD IV COPD and diastolic heart failure. She has been admitted and intubated in the past.  She called EMS in the late PM hours of 10/8 for dyspnea. Upon their arrival she was found to have O2 sats in the 50s and was cyanotic. She was treated with duoneb, magnesium, solumedrol and CPAP and only improved to O2 sats in 70s. She was intubated in ED. CXR consistent with pulmonary edema. Lasix was ordered. PCCM asked to admit.    SUBJECTIVE:  Nad/ sitting in chair/ congested cough no flutter yet   VITAL SIGNS: BP (!) 118/55   Pulse 70   Temp 98.1 F (36.7 C)   Resp 17   Ht  (1.626 m)   Wt 130 lb 6.4 oz (59.1 kg) Comment: b scale  SpO2 97%   BMI 22.38 kg/m   FIO2  = 2lpm  HEMODYNAMICS:    VENTILATOR SETTINGS: FiO2 (%):  [28 %] 28 %  INTAKE / OUTPUT: I/O last 3 completed shifts: In: 667.9 [P.O.:480; I.V.:187.9] Out: 1825 [Urine:1825]  PHYSICAL EXAMINATION: General: thin chronically ill appearing  wf appears slt > stated age   HEENT: MM pink/moist, no jvd PSY: calm/appropriate Neuro: AAOx4, speech clear, MAE  CV: s1s2 rrr, no m/r/g PULM: even/non-labored, distant insp / exp rhonchi better p cough  JX:BJYN, non-tender, bsx4 active  Extremities: warm/dry, no edema  Skin: no rashes or lesions  LABS:  BMET  Recent Labs Lab 05/31/17 0346 06/02/17 0239 06/03/17 0434  NA 133* 130* 129*  K 3.8 3.1* 3.8  CL 89* 87* 87*  CO2 36* 35* 37*  BUN CREATININE 0.79 0.76 0.74  GLUCOSE 107* 87 95    Electrolytes  Recent Labs Lab 05/29/17 0549 05/30/17 0425  05/31/17 0346 06/02/17 0239 06/03/17 0434  CALCIUM  --  8.6*  < > 8.7* 8.4* 8.6*  MG 2.2 1.9  --   --  1.8 1.8   PHOS 2.5 3.3  --   --   --   --   < > = values in this interval not displayed.  CBC  Recent Labs Lab 06/01/17 0311 06/02/17 0239 06/03/17 0434  WBC 10.0 7.1 5.4  HGB 11.5* 11.4* 10.6*  HCT 36.0 34.9* 31.7*  PLT 128* 139* 121*    Coag's  Recent Labs Lab 06/01/17 0311  INR 1.09    Sepsis Markers  Recent Labs Lab 05/29/17 0134 05/29/17 0549  LATICACIDVEN 1.65  --   PROCALCITON  --  0.22    ABG  Recent Labs Lab 05/29/17 0055 05/29/17 0157 06/01/17 1241  PHART 7.164* 7.361 7.368  PCO2ART 98.5* 59.2* 72.2*  PO2ART 151* 130.0* 62.0*    Liver Enzymes  Recent Labs Lab 05/29/17 0127  AST 26  ALT 13*  ALKPHOS 88  BILITOT 0.9  ALBUMIN 3.3*    Cardiac Enzymes  Recent Labs Lab 05/29/17 0856 05/29/17 1429 05/30/17 0425  TROPONINI 0.84* 0.97* 0.88*    Glucose  Recent Labs Lab 05/30/17 1130 05/30/17 1538 05/30/17 2026 05/30/17 2349 05/31/17 0352 05/31/17 0747  GLUCAP 83 143* 151* 113* 106* 128*    Imaging No results found.  STUDIES:   CULTURES: BC x 2  10/9  >>> Resp viral panel 10/10 > neg   ANTIBIOTICS: Levaquin 10/9>   10/12   SIGNIFICANT EVENTS: 10/09  Admit  LINES/TUBES: ETT 10/8 >> 10/10  DISCUSSION: 66 year old female with known severe 2L O2 dependent GOLD IV COPD and known diastolic CHF admitted for mixed respiratory failure. Most likely due to pulmonary edema in the setting of decompensated heart failure, but the differential also includes COPD exacerbation and less likely community-acquired pneumonia.    ASSESSMENT / PLAN:  PULMONARY A: Acute on chronic hypoxemic/hypercarbic respiratory failure Pulmonary edema Possible acute exacerbation of COPD ? CAP > strongly doubt Cigarette use ongoing P:   Pulmonary hygiene - IS, mobilize  Continue Breo + Spiriva at discharge Q6 Duoneb prn  Smoking cessation counseling  Added flutter 10/13 but not started yet > asked nursing to check on this 10/14 am   Prednisone  down to 40 mg rec reduce by 10 mg every 3 days  To zero      CARDIOVASCULAR A:  Acute on chronic diastolic heart failure NSTEMI - ECHO with new reduction in EF, new wall motion abnormality P:  Per cards/ T  Surgery - would be careful with coreg use here : Strongly prefer in this setting: Bystolic, the most beta -1  selective Beta blocker available in sample form, with bisoprolol the most selective generic choice  on the market.   RENAL A:   Hyponatremia P:   Lasix 40 mg po QD per cards  Replace electrolytes as indicated Avoid nephrotoxic agents, ensure adequate renal perfusion  GASTROINTESTINAL A:   History of GERD P:   Diet as tolerated once cath completed PPI   HEMATOLOGIC A:   No acute issues P:  Trend CBC  Heparin gtt per pharmacy /cards  INFECTIOUS A:   - no evidence cap/ off levaquin 10/12   ENDOCRINE A:   Hyperglycemia with no history of diabetes mellitus P:   Monitor glucose on BMP  NEUROLOGIC A:   Acute metabolic encephalopathy - resolved P:   Ambulate with assistance     pft's ordered but may not acurately reflect optimal fev1  But may give [place] additional info as to timing of cabg consideration - note the bicarb being so high is indicative of chronic hypercarbia and is an additional concern for surgery in addition to fev1 before and after saba      Sandrea Hughs, MD Pulmonary and Critical Care Medicine [facility] Cell [phone] After 5:30 PM or weekends, use Beeper [phone]

## 2017-06-03 NOTE — Progress Notes (Signed)
Patient ID: Samantha Dyer, female   DOB: 10-Oct-1950, 66 y.o.   MRN: [id number]  PROGRESS NOTE    Tashay Bozich  WUJ:[id number] DOB: 1951/08/10 DOA: 05/28/2017 PCP: No primary care provider on file.   Brief Narrative:  66 year old female with history of ongoing tobacco abuse, chronic hypoxic respiratory failure on home oxygen, COPD Gold IV and diastolic heart failure was admitted on 05/28/2017 for respiratory failure and was intubated for probable acute COPD exacerbation along with acute on chronic diastolic heart failure with possibility for pneumonia and started on IV diuretics, steroids and antibiotics. Patient was subsequently extubated. IV Solu-Medrol was switched to oral prednisone. She was found to have EF of 40-45%. Cardiology was consulted. Patient had cardiac catheterization on 06/01/2017 and was found to have severe multivessel coronary artery disease. Cardiaothoracic surgery was consulted. Patient was transferred out to the floor and hospitalist service took over the care from 06/02/2017.  Assessment & Plan:   Active Problems:   Acute on chronic respiratory failure with hypoxia and hypercapnia (HCC)   COPD exacerbation (HCC)   Acute respiratory failure with hypoxia and hypercapnia (HCC)   Acute diastolic (congestive) heart failure (HCC)   Hyponatremia   Non-ST elevation (NSTEMI) myocardial infarction (HCC)   Systolic congestive heart failure (HCC)   Ischemic cardiomyopathy   Hypokalemia   Pure hyperglyceridemia  Acute on chronic hypoxic/hypercarbic respiratory failure - Status post extubation. Continue oxygen supplementation. Pulmonary following  COPD exacerbation - Continue DuoNeb and Breo and Spiriva. Continue prednisone taper - Antibiotics have been discontinued by pulmonary as they don't think that patient had pneumonia  Multivessel coronary artery disease with non-STEMI - Cardiology and cardiaothoracic surgery following - Continue aspirin, statin and  bisoprolol   Acute on chronic systolic and diastolic heart failure - Patient has new systolic heart failure with ejection fraction of 40-45%. Continue Lasix. Strict Input and output; daily weights. Negative fluid balance of 7893.6 mL since admission - Cardiology following  Chronic tobacco abuse - Counseled about tobacco cessation.   Hyponatremia - From volume overload. Monitor  Hypokalemia -improving  Hyperlipidemia - Continue statin  Thrombocytopenia - questionable cause. Monitor   DVT prophylaxis: Heparin Code Status:  Full Family Communication: none present at bedside Disposition Plan: Depends on clinical outcome  Consultants: Cardiology, cardiothoracic surgery, PCCM  Procedures:  2-D echocardiogram (05/29/17)  Study Conclusions  - Left ventricle: The cavity size was normal. There was mild focal basal hypertrophy of the septum. Systolic function was mildly to moderately reduced. The estimated ejection fraction was in the range of 40% to 45%. There is severe hypokinesis of the basal-midinferoseptal myocardium. There is akinesis of the basal-midinferior myocardium. There is hypokinesis of the basal-midanteroseptal myocardium. The study is not technically sufficient to allow evaluation of LV diastolic function. - Aortic valve: Poorly visualized. - Mitral valve: Calcified annulus. Mildly thickened leaflets . - Left atrium: The atrium was moderately dilated. - Atrial septum: There was increased thickness of the septum, consistent with lipomatous hypertrophy. - Pulmonic valve: There was trivial regurgitation.   Cardiac Cath Conclusion 06/01/2017    Ost LAD lesion, 90 %stenosed. Complex, calcified trifurcation lesion with ramus.  Ost Ramus lesion, 90 %stenosed. The ramus has two large branches which cover the lateral wall. The circumflex is relatively small.  Ost RCA to Prox RCA lesion, 100 %stenosed. There are left to right  collaterals.  Prox Cx lesion, 70 %stenosed.  LV end diastolic pressure is normal.  The left ventricular ejection fraction is 35-45% by visual estimate.  There is  no aortic valve stenosis.  Normal PA pressures; Ao 89%, PA 70%, CO 7.2 L/min; CI 4.4  Severe three vessel CAD. Plan for CVTS consult for CABG.      Antimicrobials: Levaquin from 05/28/17 - 06/01/2017   Subjective: Patient seen and examined at bedside. She wants to go home. No overnight fever, nausea or vomiting. She is short of breath with exertion. No current chest pain  Objective: Vitals:   06/02/17 1933 06/02/17 2151 06/03/17 0519 06/03/17 0626  BP:  (!) 102/52  (!) 97/55  Pulse:  71  70  Resp:  17    Temp:  98.5 F (36.9 C)  98.1 F (36.7 C)  TempSrc:  Oral    SpO2: 98% 99%  97%  Weight:   59.1 kg (130 lb 6.4 oz)   Height:        Intake/Output Summary (Last 24 hours) at 06/03/17 0956 Last data filed at 06/02/17 2200  Gross per 24 hour  Intake            317.4 ml  Output              900 ml  Net           -582.6 ml   Filed Weights   06/01/17 0606 06/02/17 0435 06/03/17 0519  Weight: 57.8 kg (127 lb 8 oz) 58.5 kg (128 lb 14.4 oz) 59.1 kg (130 lb 6.4 oz)    Examination:  General exam: Appears calm and comfortable  Respiratory system: Bilateral decreased breath sound at bases With basilar crackles Cardiovascular system: S1 & S2 heard, rate controlled  Gastrointestinal system: Abdomen is nondistended, soft and nontender. Normal bowel sounds heard. Extremities: No cyanosis, clubbing, trace edema   Data Reviewed: I have personally reviewed following labs and imaging studies  CBC:  Recent Labs Lab 05/29/17 0127 05/30/17 0425 05/31/17 0346 06/01/17 0311 06/02/17 0239 06/03/17 0434  WBC 15.0* 7.9 12.2* 10.0 7.1 5.4  NEUTROABS 14.1*  --   --   --   --   --   HGB 12.1 11.5* 12.1 11.5* 11.4* 10.6*  HCT 37.5 34.8* 37.3 36.0 34.9* 31.7*  MCV 93.8 90.4 92.8 93.8 92.1 91.4  PLT 165 127* 132*  128* 139* 121*   Basic Metabolic Panel:  Recent Labs Lab 05/29/17 0549 05/30/17 0425 05/30/17 1348 05/31/17 0346 06/02/17 0239 06/03/17 0434  NA  --  130* 132* 133* 130* 129*  K  --  3.0* 4.4 3.8 3.1* 3.8  CL  --  90* 90* 89* 87* 87*  CO2  --  29 34* 36* 35* 37*  GLUCOSE  --  137* 121* 107* 87 95  BUN  --  12 21* CREATININE  --  0.84 0.91 0.79 0.76 0.74  CALCIUM  --  8.6* 8.5* 8.7* 8.4* 8.6*  MG 2.2 1.9  --   --  1.8 1.8  PHOS 2.5 3.3  --   --   --   --    GFR: Estimated Creatinine Clearance: 60.5 mL/min (by C-G formula based on SCr of 0.74 mg/dL). Liver Function Tests:  Recent Labs Lab 05/29/17 0127  AST 26  ALT 13*  ALKPHOS 88  BILITOT 0.9  PROT 5.2*  ALBUMIN 3.3*   No results for input(s): LIPASE, AMYLASE in the last 168 hours. No results for input(s): AMMONIA in the last 168 hours. Coagulation Profile:  Recent Labs Lab 06/01/17 0311  INR 1.09   Cardiac Enzymes:  Recent Labs Lab 05/29/17  1610 05/29/17 0856 05/29/17 1429 05/30/17 0425  TROPONINI 0.59* 0.84* 0.97* 0.88*   BNP (last 3 results) No results for input(s): PROBNP in the last 8760 hours. HbA1C: No results for input(s): HGBA1C in the last 72 hours. CBG:  Recent Labs Lab 05/30/17 1538 05/30/17 2026 05/30/17 2349 05/31/17 0352 05/31/17 0747  GLUCAP 143* 151* 113* 106* 128*   Lipid Profile:  Recent Labs  06/01/17 0311  TRIG 104   Thyroid Function Tests: No results for input(s): TSH, T4TOTAL, FREET4, T3FREE, THYROIDAB in the last 72 hours. Anemia Panel: No results for input(s): VITAMINB12, FOLATE, FERRITIN, TIBC, IRON, RETICCTPCT in the last 72 hours. Sepsis Labs:  Recent Labs Lab 05/29/17 0134 05/29/17 0549  PROCALCITON  --  0.22  LATICACIDVEN 1.65  --     Recent Results (from the past 240 hour(s))  MRSA PCR Screening     Status: None   Collection Time: 05/29/17  5:39 AM  Result Value Ref Range Status   MRSA by PCR NEGATIVE NEGATIVE Final    Comment:         The GeneXpert MRSA Assay (FDA approved for NASAL specimens only), is one component of a comprehensive MRSA colonization surveillance program. It is not intended to diagnose MRSA infection nor to guide or monitor treatment for MRSA infections.   Culture, blood (routine x 2)     Status: None (Preliminary result)   Collection Time: 05/29/17  5:49 AM  Result Value Ref Range Status   Specimen Description BLOOD LEFT HAND  Final   Special Requests   Final    BOTTLES DRAWN AEROBIC AND ANAEROBIC Blood Culture adequate volume   Culture NO GROWTH 4 DAYS  Final   Report Status PENDING  Incomplete  Culture, blood (routine x 2)     Status: None (Preliminary result)   Collection Time: 05/29/17  5:55 AM  Result Value Ref Range Status   Specimen Description BLOOD RIGHT HAND  Final   Special Requests   Final    BOTTLES DRAWN AEROBIC ONLY Blood Culture adequate volume   Culture NO GROWTH 4 DAYS  Final   Report Status PENDING  Incomplete  Respiratory Panel by PCR     Status: None   Collection Time: 05/30/17 11:31 AM  Result Value Ref Range Status   Adenovirus NOT DETECTED NOT DETECTED Final   Coronavirus 229E NOT DETECTED NOT DETECTED Final   Coronavirus HKU1 NOT DETECTED NOT DETECTED Final   Coronavirus NL63 NOT DETECTED NOT DETECTED Final   Coronavirus OC43 NOT DETECTED NOT DETECTED Final   Metapneumovirus NOT DETECTED NOT DETECTED Final   Rhinovirus / Enterovirus NOT DETECTED NOT DETECTED Final   Influenza A NOT DETECTED NOT DETECTED Final   Influenza B NOT DETECTED NOT DETECTED Final   Parainfluenza Virus 1 NOT DETECTED NOT DETECTED Final   Parainfluenza Virus 2 NOT DETECTED NOT DETECTED Final   Parainfluenza Virus 3 NOT DETECTED NOT DETECTED Final   Parainfluenza Virus 4 NOT DETECTED NOT DETECTED Final   Respiratory Syncytial Virus NOT DETECTED NOT DETECTED Final   Bordetella pertussis NOT DETECTED NOT DETECTED Final   Chlamydophila pneumoniae NOT DETECTED NOT DETECTED Final    Mycoplasma pneumoniae NOT DETECTED NOT DETECTED Final         Radiology Studies: Dg Chest Port 1 View  Result Date: 06/02/2017 CLINICAL DATA:  Congestive heart failure. EXAM: PORTABLE CHEST 1 VIEW COMPARISON:  Radiograph of May 30, 2017. FINDINGS: Stable cardiomediastinal silhouette. Atherosclerosis of thoracic aorta is noted. No pneumothorax or  pleural effusion is noted. Minimal interstitial densities are noted in both lung bases which may represent minimal pulmonary edema. Bony thorax is unremarkable. IMPRESSION: Possible minimal bibasilar pulmonary edema.  Aortic atherosclerosis. Electronically Signed   By: Lupita Raider, M.D.   On: 06/02/2017 08:09        Scheduled Meds: . amLODipine  5 mg Oral Daily  . aspirin  81 mg Oral Daily  . atorvastatin  80 mg Oral q1800  . bisoprolol  5 mg Oral Daily  . fluticasone furoate-vilanterol  1 puff Inhalation Daily  . furosemide  40 mg Oral Daily  . ipratropium-albuterol  3 mL Nebulization Q6H  . losartan  25 mg Oral Daily  . mouth rinse  15 mL Mouth Rinse BID  . pantoprazole  40 mg Oral QAC breakfast  . predniSONE  40 mg Oral Q breakfast  . senna-docusate  1 tablet Oral BID  . sodium chloride flush  3 mL Intravenous Q12H  . tiotropium  18 mcg Inhalation Daily   Continuous Infusions: . sodium chloride    . heparin 900 Units/hr (06/02/17 0824)     LOS: 5 days        Glade Lloyd, MD [facility] Pager [phone]  If 7PM-7AM, please contact night-coverage [email or web address] Password [email or web address] 06/03/2017, 9:56 AM

## 2017-06-03 NOTE — Progress Notes (Signed)
Progress Note  Patient Name: Samantha Dyer Date of Encounter: 06/03/2017  Primary Cardiologist: Allyson Sabal  Subjective   No recurrent "indigestion" which is her anginal equivalent. Breathing better.  Inpatient Medications    Scheduled Meds: . amLODipine  5 mg Oral Daily  . aspirin  81 mg Oral Daily  . atorvastatin  80 mg Oral q1800  . bisoprolol  5 mg Oral Daily  . fluticasone furoate-vilanterol  1 puff Inhalation Daily  . furosemide  40 mg Intravenous Daily  . ipratropium-albuterol  3 mL Nebulization Q6H  . mouth rinse  15 mL Mouth Rinse BID  . pantoprazole  40 mg Oral QAC breakfast  . predniSONE  40 mg Oral Q breakfast  . sodium chloride flush  3 mL Intravenous Q12H  . tiotropium  18 mcg Inhalation Daily   Continuous Infusions: . sodium chloride    . heparin 900 Units/hr (06/02/17 0824)   PRN Meds: sodium chloride, acetaminophen, ondansetron (ZOFRAN) IV, sodium chloride flush   Vital Signs    Vitals:   06/02/17 1933 06/02/17 2151 06/03/17 0519 06/03/17 0626  BP:  (!) 102/52  (!) 97/55  Pulse:  71  70  Resp:  17    Temp:  98.5 F (36.9 C)  98.1 F (36.7 C)  TempSrc:  Oral    SpO2: 98% 99%  97%  Weight:   130 lb 6.4 oz (59.1 kg)   Height:        Intake/Output Summary (Last 24 hours) at 06/03/17 0926 Last data filed at 06/02/17 2200  Gross per 24 hour  Intake            317.4 ml  Output              900 ml  Net           -582.6 ml    I/O since admission: -7893  Filed Weights   06/01/17 0606 06/02/17 0435 06/03/17 0519  Weight: 127 lb 8 oz (57.8 kg) 128 lb 14.4 oz (58.5 kg) 130 lb 6.4 oz (59.1 kg)    Telemetry    Sinus in 70s - Personally Reviewed  ECG    ECG (independently read by me): ST at 110 with LVH and T wave abnormalities  Physical Exam    BP (!) 97/55 (BP Location: Left Arm)   Pulse 70   Temp 98.1 F (36.7 C)   Resp 17   Ht  (1.626 m)   Wt 130 lb 6.4 oz (59.1 kg) Comment: b scale  SpO2 97%   BMI 22.38 kg/m  General:  Alert, oriented, no distress.  Skin: normal turgor, no rashes, warm and dry HEENT: Normocephalic, atraumatic. Pupils equal round and reactive to light; sclera anicteric; extraocular muscles intact;  Nose without nasal septal hypertrophy Mouth/Parynx benign; Mallinpatti scale 3 Neck: No JVD, no carotid bruits; normal carotid upstroke Lungs:  Chest wall: without tenderness to palpitation Heart: PMI not displaced, RRR, s1 s2 normal, 1/6 systolic murmur, no diastolic murmur, no rubs, gallops, thrills, or heaves Abdomen: soft, nontender; no hepatosplenomehaly, BS+; abdominal aorta nontender and not dilated by palpation. Back: no CVA tenderness Pulses 2+; right radial Site stable Musculoskeletal: full range of motion, normal strength, no joint deformities Extremities: trace to 1+ left ankle swelling, resolution of right ankle swelling;no clubbing, cyanosis, Homan's sign negative  Neurologic: grossly nonfocal; Cranial nerves grossly wnl Psychologic: Normal mood and affect    Labs    Chemistry Recent Labs Lab 05/29/17 0127  05/31/17 0346  06/02/17 0239 06/03/17 0434  NA 129*  < > 133* 130* 129*  K 4.4  < > 3.8 3.1* 3.8  CL 89*  < > 89* 87* 87*  CO2 32  < > 36* 35* 37*  GLUCOSE 221*  < > 107* 87 95  BUN 7  < > CREATININE 0.79  < > 0.79 0.76 0.74  CALCIUM 8.4*  < > 8.7* 8.4* 8.6*  PROT 5.2*  --   --   --   --   ALBUMIN 3.3*  --   --   --   --   AST 26  --   --   --   --   ALT 13*  --   --   --   --   ALKPHOS 88  --   --   --   --   BILITOT 0.9  --   --   --   --   GFRNONAA >60  < > >60 >60 >60  GFRAA >60  < > >60 >60 >60  ANIONGAP 8  < > < > = values in this interval not displayed.   Hematology  Recent Labs Lab 06/01/17 0311 06/02/17 0239 06/03/17 0434  WBC 10.0 7.1 5.4  RBC 3.84* 3.79* 3.47*  HGB 11.5* 11.4* 10.6*  HCT 36.0 34.9* 31.7*  MCV 93.8 92.1 91.4  MCH 29.9 30.1 30.5  MCHC 31.9 32.7 33.4  RDW 14.3 14.0 13.6  PLT 128* 139* 121*    Cardiac  Enzymes  Recent Labs Lab 05/29/17 0549 05/29/17 0856 05/29/17 1429 05/30/17 0425  TROPONINI 0.59* 0.84* 0.97* 0.88*     Recent Labs Lab 05/29/17 0132  TROPIPOC 0.04     BNP  Recent Labs Lab 05/29/17 0127 05/29/17 0530  BNP 629.9* 1,600.6*     DDimer No results for input(s): DDIMER in the last 168 hours.   Lipid Panel     Component Value Date/Time   CHOL 196 05/31/2017 0346   TRIG 104 06/01/2017 0311   HDL 63 05/31/2017 0346   CHOLHDL 3.1 05/31/2017 0346   VLDL 17 05/31/2017 0346   LDLCALC 116 (H) 05/31/2017 0346    Radiology    Dg Chest Port 1 View  Result Date: 06/02/2017 CLINICAL DATA:  Congestive heart failure. EXAM: PORTABLE CHEST 1 VIEW COMPARISON:  Radiograph of May 30, 2017. FINDINGS: Stable cardiomediastinal silhouette. Atherosclerosis of thoracic aorta is noted. No pneumothorax or pleural effusion is noted. Minimal interstitial densities are noted in both lung bases which may represent minimal pulmonary edema. Bony thorax is unremarkable. IMPRESSION: Possible minimal bibasilar pulmonary edema.  Aortic atherosclerosis. Electronically Signed   By: Lupita Raider, M.D.   On: 06/02/2017 08:09    Cardiac Studies   2-D echocardiogram (05/29/17)  Study Conclusions  - Left ventricle: The cavity size was normal. There was mild focal basal hypertrophy of the septum. Systolic function was mildly to moderately reduced. The estimated ejection fraction was in the range of 40% to 45%. There is severe hypokinesis of the basal-midinferoseptal myocardium. There is akinesis of the basal-midinferior myocardium. There is hypokinesis of the basal-midanteroseptal myocardium. The study is not technically sufficient to allow evaluation of LV diastolic function. - Aortic valve: Poorly visualized. - Mitral valve: Calcified annulus. Mildly thickened leaflets . - Left atrium: The atrium was moderately dilated. - Atrial septum: There was increased  thickness of the septum, consistent with lipomatous hypertrophy. - Pulmonic valve: There was trivial regurgitation.  Cardiac Cath Conclusion     Ost LAD lesion, 90 %stenosed. Complex, calcified trifurcation lesion with ramus.  Ost Ramus lesion, 90 %stenosed. The ramus has two large branches which cover the lateral wall. The circumflex is relatively small.  Ost RCA to Prox RCA lesion, 100 %stenosed. There are left to right collaterals.  Prox Cx lesion, 70 %stenosed.  LV end diastolic pressure is normal.  The left ventricular ejection fraction is 35-45% by visual estimate.  There is no aortic valve stenosis.  Normal PA pressures; Ao 89%, PA 70%, CO 7.2 L/min; CI 4.4   Severe three vessel CAD.  Plan for CVTS consult for CABG.   Restart heparin in 6 hours.           Patient Profile   66 y.o. female with ongoing tobacco abuse, COPD with multiple admissions for exacerbation requiring intubation. We saw her yesterday because of mildly elevated troponins and a 2-D echo that showed new LV dysfunction with segmental wall motion abnormalities. She does complain of "heartburn" over the last month which has been worrying her.  Assessment & Plan   1. NSTEMI; no recurrent indigestion symptoms, which is her anginal equivalent; severe MV CAD 2. Multivessel CAD with LM equivalence and total RCA.  Needs surgery, PFTs have not yet been done, although ordered. Amlodipine was added yesterday for improved anti-ischemic benefit and her beta blocker therapy was changed to bisoprolol for improved cardio selectivity. No wheezing   3. Ischemic cardiomyopathy: EF 40-45%; we will transition furosemide to 40 mg orally; with significant COPD, will add low dose ARB rather than ACE-I. 4. Gold IV COPD; now on  bisoprolol for improved cardioselectivity; on prednisine taper, atropine, etc. Awaiting PFTs and final assessment regarding CABG revascularization 5. Hyperlipidemia; LDL 116  Increase  atorvastatin to 80 mg. 6. HypoK: was 3.1; improved today to 3.8;  Magnesium today 1.8 7. Ongoing Tobacco; discussed smoking cessation  Signed, Lennette Bihari, MD, [facility] 06/03/2017, 9:26 AM

## 2017-06-03 NOTE — Progress Notes (Signed)
Patient refused bed alarm. Will continue to monitor patient. 

## 2017-06-03 NOTE — Progress Notes (Signed)
      [address]       [place] [address]             [phone]      "I am ready to go home" No "indigestion"  Blood pressure (!) 118/55, pulse 70, temperature 98.1 F (36.7 C), resp. rate 17, height  (1.626 m), weight 130 lb 6.4 oz (59.1 kg), SpO2 97 %.  Alert and oriented in no distress Cardiac RRR Lungs diminished bilaterally, no wheezing at present  Await vascular studies (bilateral carotid bruits) and PFTs tomorrow PFTs likely worse than baseline but should give some indicator of chances of weaning from vent  She says that she will continue to smoke  [place] C. Dorris Fetch, MD [facility] [phone]

## 2017-06-03 NOTE — Progress Notes (Signed)
ANTICOAGULATION CONSULT NOTE - Follow Up Consult  Pharmacy Consult for Heparin  Indication: chest pain/ACS, s/p cath, possible CABG   Allergies  Allergen Reactions  . Augmentin [Amoxicillin-Pot Clavulanate] Swelling    Patient Measurements: Height:  (162.6 cm) Weight: 130 lb 6.4 oz (59.1 kg) (b scale) IBW/kg (Calculated) : 54.7  Vital Signs: Temp: 98.1 F (36.7 C) (10/14 0626) BP: 118/55 (10/14 1023) Pulse Rate: 70 (10/14 0626)  Labs:  Recent Labs  06/01/17 0311 06/02/17 0239 06/02/17 1219 06/03/17 0434  HGB 11.5* 11.4*  --  10.6*  HCT 36.0 34.9*  --  31.7*  PLT 128* 139*  --  121*  LABPROT 14.0  --   --   --   INR 1.09  --   --   --   HEPARINUNFRC 0.22* 0.23* 0.35 0.41  CREATININE  --  0.76  --  0.74    Estimated Creatinine Clearance: 60.5 mL/min (by C-G formula based on SCr of 0.74 mg/dL).   Assessment: 66 yof continuing on heparin while waiting for pulmonary clearance for CABG, heparin level remains therapeutic. Hg low stable, plt down a bit to 121. No bleed documented.   Goal of Therapy:  Heparin level 0.3-0.7 units/ml Monitor platelets by anticoagulation protocol: Yes   Plan:  Continue heparin at 900 units/hr Daily heparin level/CBC Monitor for s/sx bleeding  Babs Bertin, PharmD, BCPS Clinical Pharmacist Rx Phone # for today: [phone] After 3:30PM, please call Main Rx: #[id number] 06/03/2017 10:26 AM

## 2017-06-04 ENCOUNTER — Inpatient Hospital Stay (HOSPITAL_COMMUNITY): Payer: Medicare Other

## 2017-06-04 ENCOUNTER — Other Ambulatory Visit (HOSPITAL_COMMUNITY): Payer: Self-pay

## 2017-06-04 DIAGNOSIS — Z01810 Encounter for preprocedural cardiovascular examination: Secondary | ICD-10-CM

## 2017-06-04 LAB — URINALYSIS, COMPLETE (UACMP) WITH MICROSCOPIC
Bilirubin Urine: NEGATIVE
Glucose, UA: NEGATIVE mg/dL
Hgb urine dipstick: NEGATIVE
Ketones, ur: NEGATIVE mg/dL
Leukocytes, UA: NEGATIVE
Nitrite: NEGATIVE
Protein, ur: NEGATIVE mg/dL
Specific Gravity, Urine: 1.004 — ABNORMAL LOW (ref 1.005–1.030)
pH: 5 (ref 5.0–8.0)

## 2017-06-04 LAB — PULMONARY FUNCTION TEST
DL/VA % pred: 89 %
DL/VA: 4.23 ml/min/mmHg/L
DLCO COR: 12.63 ml/min/mmHg
DLCO UNC % PRED: 47 %
DLCO UNC: 11.18 ml/min/mmHg
DLCO cor % pred: 53 %
FEF 25-75 POST: 0.3 L/s
FEF 25-75 Pre: 0.57 L/sec
FEF2575-%Change-Post: -48 %
FEF2575-%PRED-POST: 14 %
FEF2575-%PRED-PRE: 27 %
FEV1-%Change-Post: -8 %
FEV1-%PRED-POST: 36 %
FEV1-%Pred-Pre: 40 %
FEV1-POST: 0.87 L
FEV1-Pre: 0.95 L
FEV1FVC-%CHANGE-POST: 0 %
FEV1FVC-%Pred-Pre: 76 %
FEV6-%Change-Post: -7 %
FEV6-%PRED-PRE: 53 %
FEV6-%Pred-Post: 49 %
FEV6-POST: 1.46 L
FEV6-PRE: 1.57 L
FEV6FVC-%CHANGE-POST: 0 %
FEV6FVC-%PRED-POST: 104 %
FEV6FVC-%PRED-PRE: 103 %
FVC-%Change-Post: -8 %
FVC-%PRED-PRE: 52 %
FVC-%Pred-Post: 47 %
FVC-PRE: 1.61 L
FVC-Post: 1.47 L
POST FEV6/FVC RATIO: 100 %
PRE FEV1/FVC RATIO: 59 %
PRE FEV6/FVC RATIO: 100 %
Post FEV1/FVC ratio: 59 %
RV % pred: 181 %
RV: 3.78 L
TLC % PRED: 109 %
TLC: 5.45 L

## 2017-06-04 LAB — BASIC METABOLIC PANEL
ANION GAP: 8 (ref 5–15)
BUN: 15 mg/dL (ref 6–20)
CHLORIDE: 84 mmol/L — AB (ref 101–111)
CO2: 35 mmol/L — ABNORMAL HIGH (ref 22–32)
Calcium: 8.7 mg/dL — ABNORMAL LOW (ref 8.9–10.3)
Creatinine, Ser: 0.82 mg/dL (ref 0.44–1.00)
GFR calc non Af Amer: >60 mL/min (ref >60)
GLUCOSE: 89 mg/dL (ref 65–99)
POTASSIUM: 3.7 mmol/L (ref 3.5–5.1)
SODIUM: 127 mmol/L — AB (ref 135–145)

## 2017-06-04 LAB — CBC
HCT: 30.5 % — ABNORMAL LOW (ref 36.0–46.0)
HEMOGLOBIN: 10.2 g/dL — AB (ref 12.0–15.0)
MCH: 30.3 pg (ref 26.0–34.0)
MCHC: 33.4 g/dL (ref 30.0–36.0)
MCV: 90.5 fL (ref 78.0–100.0)
Platelets: 115 10*3/uL — ABNORMAL LOW (ref 150–400)
RBC: 3.37 MIL/uL — AB (ref 3.87–5.11)
RDW: 13.5 % (ref 11.5–15.5)
WBC: 6.2 10*3/uL (ref 4.0–10.5)

## 2017-06-04 LAB — HEPARIN LEVEL (UNFRACTIONATED): HEPARIN UNFRACTIONATED: 0.41 [IU]/mL (ref 0.30–0.70)

## 2017-06-04 LAB — OSMOLALITY, URINE: Osmolality, Ur: 208 mOsm/kg — ABNORMAL LOW (ref 300–900)

## 2017-06-04 LAB — SODIUM, URINE, RANDOM: Sodium, Ur: 44 mmol/L

## 2017-06-04 LAB — MAGNESIUM: Magnesium: 1.8 mg/dL (ref 1.7–2.4)

## 2017-06-04 MED ORDER — NICOTINE 21 MG/24HR TD PT24
21.0000 mg | MEDICATED_PATCH | Freq: Every day | TRANSDERMAL | Status: DC
  Administered 2017-06-04 – 2017-06-14 (×10): 21 mg via TRANSDERMAL
  Filled 2017-06-04 (×10): qty 1

## 2017-06-04 MED ORDER — ALPRAZOLAM 0.25 MG PO TABS
0.2500 mg | ORAL_TABLET | ORAL | Status: DC | PRN
  Administered 2017-06-09: 0.5 mg via ORAL
  Filled 2017-06-04: qty 2

## 2017-06-04 MED ORDER — IPRATROPIUM-ALBUTEROL 0.5-2.5 (3) MG/3ML IN SOLN: 3.0000 mL | Freq: Four times a day (QID) | RESPIRATORY_TRACT | Status: DC | PRN

## 2017-06-04 MED ORDER — PREDNISONE 20 MG PO TABS
30.0000 mg | ORAL_TABLET | Freq: Every day | ORAL | Status: DC
  Administered 2017-06-05: 30 mg via ORAL
  Filled 2017-06-04: qty 1

## 2017-06-04 MED ORDER — ALBUTEROL SULFATE (2.5 MG/3ML) 0.083% IN NEBU: 2.5000 mg | INHALATION_SOLUTION | RESPIRATORY_TRACT | Status: DC | PRN

## 2017-06-04 MED ORDER — ALBUTEROL SULFATE (2.5 MG/3ML) 0.083% IN NEBU
2.5000 mg | INHALATION_SOLUTION | Freq: Once | RESPIRATORY_TRACT | Status: AC
  Administered 2017-06-04: 2.5 mg via RESPIRATORY_TRACT

## 2017-06-04 MED FILL — Lidocaine HCl Local Inj 2%: INTRAMUSCULAR | Qty: 10 | Status: AC

## 2017-06-04 MED FILL — Heparin Sodium (Porcine) 2 Unit/ML in Sodium Chloride 0.9%: INTRAMUSCULAR | Qty: 500 | Status: AC

## 2017-06-04 NOTE — Progress Notes (Signed)
3 Days Post-Op Procedure(s) (LRB): RIGHT/LEFT HEART CATH AND CORONARY ANGIOGRAPHY (N/A) Subjective: She denies chest pain and shortness of breath "I want to go home". Asking if it is OK for her to have a few cigarettes while in hospital  Objective: Vital signs in last 24 hours: Temp:  [97.9 F (36.6 C)-98.6 F (37 C)] 98.6 F (37 C) (10/15 1402) Pulse Rate:  [65-72] 65 (10/15 1402) Cardiac Rhythm: Normal sinus rhythm (10/15 0712) Resp:  [16-18] 18 (10/15 1402) BP: (101-115)/(38-84) 109/38 (10/15 1402) SpO2:  [88 %-99 %] 93 % (10/15 1402) Weight:  [131 lb 8 oz (59.6 kg)] 131 lb 8 oz (59.6 kg) (10/15 0709)  Hemodynamic parameters for last 24 hours:    Intake/Output from previous day: 10/14 0701 - 10/15 0700 In: 1293 [P.O.:960; I.V.:333] Out: 900 [Urine:900] Intake/Output this shift: Total I/O In: 240 [P.O.:240] Out: 700 [Urine:700]  General appearance: alert, cooperative and no distress Neurologic: intact Heart: regular rate and rhythm Lungs: diminished BS no wheezing  Lab Results:  Recent Labs  06/03/17 0434 06/04/17 0604  WBC 5.4 6.2  HGB 10.6* 10.2*  HCT 31.7* 30.5*  PLT 121* 115*   BMET:  Recent Labs  06/03/17 0434 06/04/17 0604  NA 129* 127*  K 3.8 3.7  CL 87* 84*  CO2 37* 35*  GLUCOSE 95 89  BUN 13 15  CREATININE 0.74 0.82  CALCIUM 8.6* 8.7*    PT/INR: No results for input(s): LABPROT, INR in the last 72 hours. ABG    Component Value Date/Time   PHART 7.368 06/01/2017 1241   HCO3 42.8 (H) 06/01/2017 1249   TCO2 45 (H) 06/01/2017 1249   O2SAT 70.0 06/01/2017 1249   CBG (last 3)  No results for input(s): GLUCAP in the last 72 hours.  Assessment/Plan: S/P Procedure(s) (LRB): RIGHT/LEFT HEART CATH AND CORONARY ANGIOGRAPHY (N/A) -  Reviewed PFTs- FEV1= 0.95(40%) and DLCO 12.63 (53%). Reviewed carotid duplex: 40-59% RICA and 60-79% LICA  PFTs are in 40-50% of normal range. She is relatively high risk for pulmonary complications, but risk  is by no means prohibitive.  I informed her of the indications, risks, benefits and alternatives (medical therapy) for CABG. She understands the risks include but are not limited to death, MI, stroke, bleeding, possible need for transfusion, infection, DVT, PE, cardiac arrhythmias, respiratory failure, renal failure as well as other unforeseeable complications. She accepts risks and agrees to proceed.  I have ordered a nicotine patch to help with cravings.  Plan surgery Wed 06/06/2017   LOS: 6 days    Loreli Slot 06/04/2017

## 2017-06-04 NOTE — Progress Notes (Signed)
PULMONARY / CRITICAL CARE MEDICINE   Name: Samantha Dyer MRN: [id number] DOB: [birth year]    ADMISSION DATE:  05/28/2017 CONSULTATION DATE:  05/29/2017  REFERRING MD:  Dr. Blinda Leatherwood  CHIEF COMPLAINT:  Respiratory failure  BRIEF SUMMARY:   66 yo female smoker presented with dyspnea, hypoxia from acute pulmonary edema and possible pneumonia with COPD exacerbation.  She required intubation.  Found to have acute systolic CHF and severe multivessel CAD. PMHx of GOLD 4 COPD on 2 liters home oxygen, diastolic CHF.  SUBJECTIVE:  Denies cough, wheeze.  PFT showed moderate to severe obstruction, air trapping, and moderate to severe diffusion defect.  VITAL SIGNS: BP (!) 108/46 (BP Location: Right Arm)   Pulse 68   Temp 98.6 F (37 C) (Oral)   Resp 18   Ht  (1.626 m)   Wt 131 lb 8 oz (59.6 kg) Comment: bed; b scale needs batteries  SpO2 93%   BMI 22.57 kg/m    INTAKE / OUTPUT: I/O last 3 completed shifts: In: 1533 [P.O.:1200; I.V.:333] Out: 900 [Urine:900]  PHYSICAL EXAMINATION:  General - pleasant Eyes - pupils reactive ENT - no sinus tenderness, no oral exudate, no LAN Cardiac - regular, no murmur Chest - no wheeze, rales Abd - soft, non tender Ext - no edema Skin - no rashes Neuro - normal strength Psych - normal mood  LABS:  BMET  Recent Labs Lab 06/02/17 0239 06/03/17 0434 06/04/17 0604  NA 130* 129* 127*  K 3.1* 3.8 3.7  CL 87* 87* 84*  CO2 35* 37* 35*  BUN CREATININE 0.76 0.74 0.82  GLUCOSE 87 95 89    Electrolytes  Recent Labs Lab 05/29/17 0549 05/30/17 0425  06/02/17 0239 06/03/17 0434 06/04/17 0604  CALCIUM  --  8.6*  < > 8.4* 8.6* 8.7*  MG 2.2 1.9  --  1.8 1.8 1.8  PHOS 2.5 3.3  --   --   --   --   < > = values in this interval not displayed.  CBC  Recent Labs Lab 06/02/17 0239 06/03/17 0434 06/04/17 0604  WBC 7.1 5.4 6.2  HGB 11.4* 10.6* 10.2*  HCT 34.9* 31.7* 30.5*  PLT 139* 121* 115*    Coag's  Recent  Labs Lab 06/01/17 0311  INR 1.09    Sepsis Markers  Recent Labs Lab 05/29/17 0134 05/29/17 0549  LATICACIDVEN 1.65  --   PROCALCITON  --  0.22    ABG  Recent Labs Lab 05/29/17 0055 05/29/17 0157 06/01/17 1241  PHART 7.164* 7.361 7.368  PCO2ART 98.5* 59.2* 72.2*  PO2ART 151* 130.0* 62.0*    Liver Enzymes  Recent Labs Lab 05/29/17 0127  AST 26  ALT 13*  ALKPHOS 88  BILITOT 0.9  ALBUMIN 3.3*    Cardiac Enzymes  Recent Labs Lab 05/29/17 0856 05/29/17 1429 05/30/17 0425  TROPONINI 0.84* 0.97* 0.88*    Glucose  Recent Labs Lab 05/30/17 1130 05/30/17 1538 05/30/17 2026 05/30/17 2349 05/31/17 0352 05/31/17 0747  GLUCAP 83 143* 151* 113* 106* 128*    Imaging No results found.  STUDIES:  Echo 10/09 >> EF 40 to 45% LHC/RHC 10/12 >> severe 3 vessel CAD PFT 10/15 >> FEV1% 0.95 (40%), FEV1% 59, TLC 5.45 (109%), DLCO 40%  CULTURES: Blood 10/09 >> negative Respiratory viral panel 10/10 >> negative  ANTIBIOTICS: Levaquin 10/9 > 10/12   LINES/TUBES: ETT 10/8 > 10/10  DISCUSSION: 66 yo female smoker with acute systolic CHF with acute pulmonary edema  in setting of GOLD 4 COPD.  Initially concern for CAP seems less likely.  ASSESSMENT / PLAN:  Acute on chronic hypoxic/hypercapnic respiratory failure. AECOPD. Tobacco abuse. - continue spiriva, breo, and prn albuterol - would continue to wean off prednisone  - nicotine patch  Acute systolic/diastolic CHF with acute pulmonary edema. Severe CAD. - TCTS planning CABG later this week  Discussed PFT results with pt.  Her pulmonary status is optimized as best as can be.  Pulmonary service can be available as needed after she has CABG.  Coralyn Helling, MD [facility] Pulmonary/Critical Care 06/04/2017, 3:32 PM Pager:  [phone] After 3pm call: [phone]

## 2017-06-04 NOTE — Progress Notes (Signed)
Pre-op Cardiac Surgery  Carotid Findings:  40-59% Right ICA stenosis.  Right Vertebral artery flow is antegrade. 60-79% left ICA stenosis.  Left vertebral artery flow is retrograde.   Upper Extremity Right Left  Brachial Pressures 126T 89T  Radial Waveforms T T  Ulnar Waveforms T T  Palmar Arch (Allen's Test) Doppler signal obliterates with radial compession and diminishes 50% with ulnar compression Doppler signal obliterates with radial compession and remains normal with ulnar compression   Findings:      Lower  Extremity Right Left  Dorsalis Pedis    Anterior Tibial T T  Posterior Tibial T T  Ankle/Brachial Indices      Findings:

## 2017-06-04 NOTE — Progress Notes (Signed)
Nutrition Follow-up  DOCUMENTATION CODES:   Not applicable  INTERVENTION:   -Pt declined snacks or oral nutrition supplement at this time. Continue to assess  NUTRITION DIAGNOSIS:   Inadequate oral intake related to inability to eat as evidenced by NPO status.  Being addressed as diet advanced post extubation, pt with good appetite   GOAL:   Patient will meet greater than or equal to 90% of their needs  Progressing  MONITOR:   PO intake, Labs, Weight trends  REASON FOR ASSESSMENT:   Consult (Verbal from Dr. Isaiah Serge) Enteral/tube feeding initiation and management  ASSESSMENT:   66 yo female with PMH of COPD gold IV and diastolic heart failure who was admitted on 10/8 with respiratory failure requiring intubation.  10/10 Extubated 10/12 Cardiac cath showing severe multivessel CAD, cardiothoracic surgery consulted for CABG eval  Recorded po intake 100% of meals; pt reports good appetite, eating well  Labs: sodium 127 Meds: lasix, prednisone  Diet Order:  Diet Carb Modified Fluid consistency: Thin; Room service appropriate? Yes  Skin:  Reviewed, no issues  Last BM:  PTA  Height:   Ht Readings from Last 1 Encounters:  05/29/17  (1.626 m)    Weight:   Wt Readings from Last 1 Encounters:  06/04/17 131 lb 8 oz (59.6 kg)    Ideal Body Weight:  54.5 kg  BMI:  Body mass index is 22.57 kg/m.  Estimated Nutritional Needs:   Kcal:  1600-1800 kcals  Protein:  80-90 g   Fluid:  >/= 1.6 L  EDUCATION NEEDS:   No education needs identified at this time  Romelle Starcher MS, RD, LDN [phone] Pager  [phone] Weekend/On-Call Pager

## 2017-06-04 NOTE — Progress Notes (Signed)
Patient ID: Samantha Dyer, female   DOB: 1950-12-21, 66 y.o.   MRN: [id number]  PROGRESS NOTE    Samantha Dyer  WUJ:[id number] DOB: [birth year] DOA: 05/28/2017 PCP: No primary care provider on file.   Brief Narrative:  66 year old female with history of ongoing tobacco abuse, chronic hypoxic respiratory failure on home oxygen, COPD Gold IV and diastolic heart failure was admitted on 05/28/2017 for respiratory failure and was intubated for probable acute COPD exacerbation along with acute on chronic diastolic heart failure with possibility for pneumonia and started on IV diuretics, steroids and antibiotics. Patient was subsequently extubated. IV Solu-Medrol was switched to oral prednisone. She was found to have EF of 40-45%. Cardiology was consulted. Patient had cardiac catheterization on 06/01/2017 and was found to have severe multivessel coronary artery disease. Cardiaothoracic surgery was consulted. Patient was transferred out to the floor and hospitalist service took over the care from 06/02/2017.  Assessment & Plan:   Active Problems:   Acute on chronic respiratory failure with hypoxia and hypercapnia (HCC)   COPD exacerbation (HCC)   Acute respiratory failure with hypoxia and hypercapnia (HCC)   Acute diastolic (congestive) heart failure (HCC)   Hyponatremia   Non-ST elevation (NSTEMI) myocardial infarction (HCC)   Systolic congestive heart failure (HCC)   Ischemic cardiomyopathy   Hypokalemia   Pure hyperglyceridemia  Acute on chronic hypoxic/hypercarbic respiratory failure - Status post extubation. Continue oxygen supplementation. Pulmonary following  COPD exacerbation - Continue DuoNeb and Breo and Spiriva. Continue prednisone taper - Antibiotics have been discontinued by pulmonary as they don't think that patient had pneumonia  Multivessel coronary artery disease with non-STEMI - Cardiology and cardiaothoracic surgery following - Continue aspirin, statin and  bisoprolol   Acute on chronic systolic and diastolic heart failure - Patient has new systolic heart failure with ejection fraction of 40-45%. Continue Lasix. Strict Input and output; daily weights. Negative fluid balance of 7500.6 mL since admission - Cardiology following  Chronic tobacco abuse - Counseled about tobacco cessation.   Hyponatremia -Sodium is 127 today. Repeat labs in a.m. Send urine sodium and urine osmolality. Strict input and output. Continue Lasix for today  Hypokalemia -improving  Hyperlipidemia - Continue statin  Thrombocytopenia - questionable cause. Monitor   DVT prophylaxis: Heparin Code Status:  Full Family Communication: none present at bedside Disposition Plan: Depends on clinical outcome  Consultants: Cardiology, cardiothoracic surgery, PCCM  Procedures:  2-D echocardiogram (05/29/17)  Study Conclusions  - Left ventricle: The cavity size was normal. There was mild focal basal hypertrophy of the septum. Systolic function was mildly to moderately reduced. The estimated ejection fraction was in the range of 40% to 45%. There is severe hypokinesis of the basal-midinferoseptal myocardium. There is akinesis of the basal-midinferior myocardium. There is hypokinesis of the basal-midanteroseptal myocardium. The study is not technically sufficient to allow evaluation of LV diastolic function. - Aortic valve: Poorly visualized. - Mitral valve: Calcified annulus. Mildly thickened leaflets . - Left atrium: The atrium was moderately dilated. - Atrial septum: There was increased thickness of the septum, consistent with lipomatous hypertrophy. - Pulmonic valve: There was trivial regurgitation.   Cardiac Cath Conclusion 06/01/2017    Ost LAD lesion, 90 %stenosed. Complex, calcified trifurcation lesion with ramus.  Ost Ramus lesion, 90 %stenosed. The ramus has two large branches which cover the lateral wall. The circumflex is  relatively small.  Ost RCA to Prox RCA lesion, 100 %stenosed. There are left to right collaterals.  Prox Cx lesion, 70 %stenosed.  LV end diastolic  pressure is normal.  The left ventricular ejection fraction is 35-45% by visual estimate.  There is no aortic valve stenosis.  Normal PA pressures; Ao 89%, PA 70%, CO 7.2 L/min; CI 4.4  Severe three vessel CAD. Plan for CVTS consult for CABG.      Antimicrobials: Levaquin from 05/28/17 - 06/01/2017   Subjective: Patient seen and examined at bedside. No overnight fever, nausea or vomiting.  No current chest pain  Objective: Vitals:   06/03/17 1944 06/03/17 2007 06/04/17 0709 06/04/17 0812  BP:  101/84 115/71   Pulse:  72 69   Resp:  16 17   Temp:  98.5 F (36.9 C) 97.9 F (36.6 C)   TempSrc:  Oral Oral   SpO2: 99% 97% 98% (!) 88%  Weight:   59.6 kg (131 lb 8 oz)   Height:        Intake/Output Summary (Last 24 hours) at 06/04/17 1040 Last data filed at 06/04/17 0808  Gross per 24 hour  Intake             1053 ml  Output              400 ml  Net              653 ml   Filed Weights   06/02/17 0435 06/03/17 0519 06/04/17 0709  Weight: 58.5 kg (128 lb 14.4 oz) 59.1 kg (130 lb 6.4 oz) 59.6 kg (131 lb 8 oz)    Examination:  General exam: Appears calm and comfortable  Respiratory system: Bilateral decreased breath sound at bases With basilar crackles Cardiovascular system: S1 & S2 heard, rate controlled  Gastrointestinal system: Abdomen is nondistended, soft and nontender. Normal bowel sounds heard. Extremities: No cyanosis, clubbing, trace edema   Data Reviewed: I have personally reviewed following labs and imaging studies  CBC:  Recent Labs Lab 05/29/17 0127  05/31/17 0346 06/01/17 0311 06/02/17 0239 06/03/17 0434 06/04/17 0604  WBC 15.0*  < > 12.2* 10.0 7.1 5.4 6.2  NEUTROABS 14.1*  --   --   --   --   --   --   HGB 12.1  < > 12.1 11.5* 11.4* 10.6* 10.2*  HCT 37.5  < > 37.3 36.0 34.9* 31.7* 30.5*   MCV 93.8  < > 92.8 93.8 92.1 91.4 90.5  PLT 165  < > 132* 128* 139* 121* 115*  < > = values in this interval not displayed. Basic Metabolic Panel:  Recent Labs Lab 05/29/17 0549 05/30/17 0425 05/30/17 1348 05/31/17 0346 06/02/17 0239 06/03/17 0434 06/04/17 0604  NA  --  130* 132* 133* 130* 129* 127*  K  --  3.0* 4.4 3.8 3.1* 3.8 3.7  CL  --  90* 90* 89* 87* 87* 84*  CO2  --  29 34* 36* 35* 37* 35*  GLUCOSE  --  137* 121* 107* 87 95 89  BUN  --  12 21* CREATININE  --  0.84 0.91 0.79 0.76 0.74 0.82  CALCIUM  --  8.6* 8.5* 8.7* 8.4* 8.6* 8.7*  MG 2.2 1.9  --   --  1.8 1.8 1.8  PHOS 2.5 3.3  --   --   --   --   --    GFR: Estimated Creatinine Clearance: 59.1 mL/min (by C-G formula based on SCr of 0.82 mg/dL). Liver Function Tests:  Recent Labs Lab 05/29/17 0127  AST 26  ALT 13*  ALKPHOS 88  BILITOT  0.9  PROT 5.2*  ALBUMIN 3.3*   No results for input(s): LIPASE, AMYLASE in the last 168 hours. No results for input(s): AMMONIA in the last 168 hours. Coagulation Profile:  Recent Labs Lab 06/01/17 0311  INR 1.09   Cardiac Enzymes:  Recent Labs Lab 05/29/17 0549 05/29/17 0856 05/29/17 1429 05/30/17 0425  TROPONINI 0.59* 0.84* 0.97* 0.88*   BNP (last 3 results) No results for input(s): PROBNP in the last 8760 hours. HbA1C: No results for input(s): HGBA1C in the last 72 hours. CBG:  Recent Labs Lab 05/30/17 1538 05/30/17 2026 05/30/17 2349 05/31/17 0352 05/31/17 0747  GLUCAP 143* 151* 113* 106* 128*   Lipid Profile: No results for input(s): CHOL, HDL, LDLCALC, TRIG, CHOLHDL, LDLDIRECT in the last 72 hours. Thyroid Function Tests: No results for input(s): TSH, T4TOTAL, FREET4, T3FREE, THYROIDAB in the last 72 hours. Anemia Panel: No results for input(s): VITAMINB12, FOLATE, FERRITIN, TIBC, IRON, RETICCTPCT in the last 72 hours. Sepsis Labs:  Recent Labs Lab 05/29/17 0134 05/29/17 0549  PROCALCITON  --  0.22  LATICACIDVEN 1.65  --      Recent Results (from the past 240 hour(s))  MRSA PCR Screening     Status: None   Collection Time: 05/29/17  5:39 AM  Result Value Ref Range Status   MRSA by PCR NEGATIVE NEGATIVE Final    Comment:        The GeneXpert MRSA Assay (FDA approved for NASAL specimens only), is one component of a comprehensive MRSA colonization surveillance program. It is not intended to diagnose MRSA infection nor to guide or monitor treatment for MRSA infections.   Culture, blood (routine x 2)     Status: None   Collection Time: 05/29/17  5:49 AM  Result Value Ref Range Status   Specimen Description BLOOD LEFT HAND  Final   Special Requests   Final    BOTTLES DRAWN AEROBIC AND ANAEROBIC Blood Culture adequate volume   Culture NO GROWTH 5 DAYS  Final   Report Status 06/03/2017 FINAL  Final  Culture, blood (routine x 2)     Status: None   Collection Time: 05/29/17  5:55 AM  Result Value Ref Range Status   Specimen Description BLOOD RIGHT HAND  Final   Special Requests   Final    BOTTLES DRAWN AEROBIC ONLY Blood Culture adequate volume   Culture NO GROWTH 5 DAYS  Final   Report Status 06/03/2017 FINAL  Final  Respiratory Panel by PCR     Status: None   Collection Time: 05/30/17 11:31 AM  Result Value Ref Range Status   Adenovirus NOT DETECTED NOT DETECTED Final   Coronavirus 229E NOT DETECTED NOT DETECTED Final   Coronavirus HKU1 NOT DETECTED NOT DETECTED Final   Coronavirus NL63 NOT DETECTED NOT DETECTED Final   Coronavirus OC43 NOT DETECTED NOT DETECTED Final   Metapneumovirus NOT DETECTED NOT DETECTED Final   Rhinovirus / Enterovirus NOT DETECTED NOT DETECTED Final   Influenza A NOT DETECTED NOT DETECTED Final   Influenza B NOT DETECTED NOT DETECTED Final   Parainfluenza Virus 1 NOT DETECTED NOT DETECTED Final   Parainfluenza Virus 2 NOT DETECTED NOT DETECTED Final   Parainfluenza Virus 3 NOT DETECTED NOT DETECTED Final   Parainfluenza Virus 4 NOT DETECTED NOT DETECTED Final    Respiratory Syncytial Virus NOT DETECTED NOT DETECTED Final   Bordetella pertussis NOT DETECTED NOT DETECTED Final   Chlamydophila pneumoniae NOT DETECTED NOT DETECTED Final   Mycoplasma pneumoniae NOT DETECTED NOT  DETECTED Final         Radiology Studies: No results found.      Scheduled Meds: . amLODipine  5 mg Oral Daily  . aspirin  81 mg Oral Daily  . atorvastatin  80 mg Oral q1800  . bisoprolol  5 mg Oral Daily  . fluticasone furoate-vilanterol  1 puff Inhalation Daily  . furosemide  40 mg Oral Daily  . ipratropium-albuterol  3 mL Nebulization TID  . losartan  25 mg Oral Daily  . mouth rinse  15 mL Mouth Rinse BID  . pantoprazole  40 mg Oral QAC breakfast  . predniSONE  40 mg Oral Q breakfast  . senna-docusate  1 tablet Oral BID  . sodium chloride flush  3 mL Intravenous Q12H  . tiotropium  18 mcg Inhalation Daily   Continuous Infusions: . sodium chloride    . heparin 900 Units/hr (06/03/17 1026)     LOS: 6 days        Glade Lloyd, MD [facility] Pager [phone]  If 7PM-7AM, please contact night-coverage [email or web address] Password [email or web address] 06/04/2017, 10:40 AM

## 2017-06-04 NOTE — Progress Notes (Signed)
Progress Note  Patient Name: Samantha Dyer Date of Encounter: 06/04/2017  Primary Cardiologist: Allyson Sabal  Subjective   No chest pain. Walked the hallway this morning.   Inpatient Medications    Scheduled Meds: . amLODipine  5 mg Oral Daily  . aspirin  81 mg Oral Daily  . atorvastatin  80 mg Oral q1800  . bisoprolol  5 mg Oral Daily  . fluticasone furoate-vilanterol  1 puff Inhalation Daily  . furosemide  40 mg Oral Daily  . ipratropium-albuterol  3 mL Nebulization TID  . losartan  25 mg Oral Daily  . mouth rinse  15 mL Mouth Rinse BID  . pantoprazole  40 mg Oral QAC breakfast  . predniSONE  40 mg Oral Q breakfast  . senna-docusate  1 tablet Oral BID  . sodium chloride flush  3 mL Intravenous Q12H  . tiotropium  18 mcg Inhalation Daily   Continuous Infusions: . sodium chloride    . heparin 900 Units/hr (06/03/17 1026)   PRN Meds: sodium chloride, acetaminophen, ondansetron (ZOFRAN) IV, sodium chloride flush   Vital Signs    Vitals:   06/03/17 1944 06/03/17 2007 06/04/17 0709 06/04/17 0812  BP:  101/84 115/71   Pulse:  72 69   Resp:  16 17   Temp:  98.5 F (36.9 C) 97.9 F (36.6 C)   TempSrc:  Oral Oral   SpO2: 99% 97% 98% (!) 88%  Weight:   131 lb 8 oz (59.6 kg)   Height:        Intake/Output Summary (Last 24 hours) at 06/04/17 1025 Last data filed at 06/04/17 4098  Gross per 24 hour  Intake             1053 ml  Output              400 ml  Net              653 ml   Filed Weights   06/02/17 0435 06/03/17 0519 06/04/17 0709  Weight: 128 lb 14.4 oz (58.5 kg) 130 lb 6.4 oz (59.1 kg) 131 lb 8 oz (59.6 kg)    Telemetry    SR - Personally Reviewed  Physical Exam   General: Well developed, well nourished, female appearing in no acute distress. Head: Normocephalic, atraumatic.  Neck: Supple with bilateral bruits, JVD. Lungs:  Resp regular and unlabored, diminished bilaterally. Heart: RRR, S1, S2, no S3, S4, or murmur; no rub. Abdomen: Soft,  non-tender, non-distended with normoactive bowel sounds. No hepatomegaly. No rebound/guarding. No obvious abdominal masses. Extremities: No clubbing, cyanosis, edema. Distal pedal pulses are 2+ bilaterally. Neuro: Alert and oriented X 3. Moves all extremities spontaneously. Psych: Normal affect.  Labs    Chemistry Recent Labs Lab 05/29/17 0127  06/02/17 0239 06/03/17 0434 06/04/17 0604  NA 129*  < > 130* 129* 127*  K 4.4  < > 3.1* 3.8 3.7  CL 89*  < > 87* 87* 84*  CO2 32  < > 35* 37* 35*  GLUCOSE 221*  < > 87 95 89  BUN 7  < > CREATININE 0.79  < > 0.76 0.74 0.82  CALCIUM 8.4*  < > 8.4* 8.6* 8.7*  PROT 5.2*  --   --   --   --   ALBUMIN 3.3*  --   --   --   --   AST 26  --   --   --   --   ALT  13*  --   --   --   --   ALKPHOS 88  --   --   --   --   BILITOT 0.9  --   --   --   --   GFRNONAA >60  < > >60 >60 >60  GFRAA >60  < > >60 >60 >60  ANIONGAP 8  < > < > = values in this interval not displayed.   Hematology Recent Labs Lab 06/02/17 0239 06/03/17 0434 06/04/17 0604  WBC 7.1 5.4 6.2  RBC 3.79* 3.47* 3.37*  HGB 11.4* 10.6* 10.2*  HCT 34.9* 31.7* 30.5*  MCV 92.1 91.4 90.5  MCH 30.1 30.5 30.3  MCHC 32.7 33.4 33.4  RDW 14.0 13.6 13.5  PLT 139* 121* 115*    Cardiac Enzymes Recent Labs Lab 05/29/17 0549 05/29/17 0856 05/29/17 1429 05/30/17 0425  TROPONINI 0.59* 0.84* 0.97* 0.88*    Recent Labs Lab 05/29/17 0132  TROPIPOC 0.04     BNP Recent Labs Lab 05/29/17 0127 05/29/17 0530  BNP 629.9* 1,600.6*     DDimer No results for input(s): DDIMER in the last 168 hours.    Radiology    No results found.  Cardiac Studies   Cath: 06/01/17  Conclusion     Ost LAD lesion, 90 %stenosed. Complex, calcified trifurcation lesion with ramus.  Ost Ramus lesion, 90 %stenosed. The ramus has two large branches which cover the lateral wall. The circumflex is relatively small.  Ost RCA to Prox RCA lesion, 100 %stenosed. There are left  to right collaterals.  Prox Cx lesion, 70 %stenosed.  LV end diastolic pressure is normal.  The left ventricular ejection fraction is 35-45% by visual estimate.  There is no aortic valve stenosis.  Normal PA pressures; Ao 89%, PA 70%, CO 7.2 L/min; CI 4.4   Severe three vessel CAD.  Plan for CVTS consult for CABG.   Restart heparin in 6 hours      TTE: 05/29/17  Study Conclusions  - Left ventricle: The cavity size was normal. There was mild focal   basal hypertrophy of the septum. Systolic function was mildly to   moderately reduced. The estimated ejection fraction was in the   range of 40% to 45%. There is severe hypokinesis of the   basal-midinferoseptal myocardium. There is akinesis of the   basal-midinferior myocardium. There is hypokinesis of the   basal-midanteroseptal myocardium. The study is not technically   sufficient to allow evaluation of LV diastolic function. - Aortic valve: Poorly visualized. - Mitral valve: Calcified annulus. Mildly thickened leaflets . - Left atrium: The atrium was moderately dilated. - Atrial septum: There was increased thickness of the septum,   consistent with lipomatous hypertrophy. - Pulmonic valve: There was trivial regurgitation.  Patient Profile     66 y.o. female with ongoing tobacco abuse, COPD with multiple admissions for exacerbation requiring intubation. We saw her yesterday because of mildly elevated troponins and a 2-D echo that showed new LV dysfunction with segmental wall motion abnormalities. She does complain of "heartburn" over the last month which has been worrying her.  Assessment & Plan    1. NSTEMI: cath, noted above with severe 3 v disease. Seen by TCTS and undergoing work up for possible CABG. Remains on IV heparin, no chest pain.  -- on ASA, BB, statin, ARB  2. ICM: EF noted at 40-45% on echo. On lasix  PO daily. Appears volume stable on exam.   3.  COPD: Pulmonary following. Awaiting PFTs today to  determine final assessment regarding CABG.   4. HL: on high dose statin  5. Hypokalemia: resolved  6. Tobacco use: cessation advised, but patient reports that she is not ready to quit.   Signed, Laverda Page, NP  06/04/2017, 10:25 AM  Pager # [phone]   Patient seen, examined. Available data reviewed. Agree with findings, assessment, and plan as outlined by Laverda Page, NP-C. Changes made where appropriate. The patient had elevated troponin in the setting of acute respiratory failure, acute combined systolic and diastolic heart failure. 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 I have personally reviewed her cardiac catheterization films. I discussed potential treatment options - fortunately she is not having ongoing symptoms of ischemia. I do not think she would be a candidate for hemodynamically supported PCI because of chronic occlusion of the RCA and severe distal left main trifurcation disease. Awaiting final results of her pulmonary function tests. If she is a candidate for CABG, this would clearly be her best treatment option. If not, will have to consider other palliative options. Recommendations discussed with patient and her husband. Follow-up pending Dr. Sunday Corn continued evaluation.  Tonny Bollman 06/04/2017 2:05 PM   Tonny Bollman, M.D. 06/04/2017 2:01 PM   For questions or updates, please contact [facility] Please consult [email or web address] for contact info under Cardiology/STEMI. Daytime calls, contact the Day Call APP (6a-8a) or assigned team (Teams A-D) provider (7:30a - 5p). All other daytime calls (7:30-5p), contact the Card Master @ [phone].   Nighttime calls, contact the assigned APP (5p-8p) or MD (6:30p-8p). Overnight calls (8p-6a),  contact the on call Fellow @ [phone].

## 2017-06-05 LAB — BASIC METABOLIC PANEL WITH GFR
Anion gap: 6 (ref 5–15)
BUN: 16 mg/dL (ref 6–20)
CO2: 36 mmol/L — ABNORMAL HIGH (ref 22–32)
Calcium: 8.4 mg/dL — ABNORMAL LOW (ref 8.9–10.3)
Chloride: 87 mmol/L — ABNORMAL LOW (ref 101–111)
Creatinine, Ser: 0.96 mg/dL (ref 0.44–1.00)
GFR calc Af Amer: >60 mL/min
GFR calc non Af Amer: >60 mL/min
Glucose, Bld: 88 mg/dL (ref 65–99)
Potassium: 4 mmol/L (ref 3.5–5.1)
Sodium: 129 mmol/L — ABNORMAL LOW (ref 135–145)

## 2017-06-05 LAB — CBC
HCT: 30 % — ABNORMAL LOW (ref 36.0–46.0)
Hemoglobin: 10 g/dL — ABNORMAL LOW (ref 12.0–15.0)
MCH: 30.4 pg (ref 26.0–34.0)
MCHC: 33.3 g/dL (ref 30.0–36.0)
MCV: 91.2 fL (ref 78.0–100.0)
Platelets: 111 10*3/uL — ABNORMAL LOW (ref 150–400)
RBC: 3.29 MIL/uL — ABNORMAL LOW (ref 3.87–5.11)
RDW: 13.2 % (ref 11.5–15.5)
WBC: 6.1 10*3/uL (ref 4.0–10.5)

## 2017-06-05 LAB — ABO/RH: ABO/RH(D): O NEG

## 2017-06-05 LAB — HEPARIN LEVEL (UNFRACTIONATED): HEPARIN UNFRACTIONATED: 0.29 [IU]/mL — AB (ref 0.30–0.70)

## 2017-06-05 LAB — MAGNESIUM: Magnesium: 1.7 mg/dL (ref 1.7–2.4)

## 2017-06-05 MED ORDER — TEMAZEPAM 15 MG PO CAPS: 15.0000 mg | ORAL_CAPSULE | Freq: Once | ORAL | Status: DC | PRN

## 2017-06-05 MED ORDER — CHLORHEXIDINE GLUCONATE 0.12 % MT SOLN
15.0000 mL | Freq: Once | OROMUCOSAL | Status: AC
  Administered 2017-06-06: 15 mL via OROMUCOSAL
  Filled 2017-06-05: qty 15

## 2017-06-05 MED ORDER — VANCOMYCIN HCL 10 G IV SOLR
1250.0000 mg | INTRAVENOUS | Status: AC
  Administered 2017-06-06: 1250 mg via INTRAVENOUS
  Filled 2017-06-05: qty 1250

## 2017-06-05 MED ORDER — SODIUM CHLORIDE 0.9 % IV SOLN
30.0000 ug/min | INTRAVENOUS | Status: AC
  Administered 2017-06-06: 40 ug/min via INTRAVENOUS
  Filled 2017-06-05: qty 2

## 2017-06-05 MED ORDER — SODIUM CHLORIDE 0.9 % IV SOLN
INTRAVENOUS | Status: DC
  Filled 2017-06-05: qty 30

## 2017-06-05 MED ORDER — DIAZEPAM 5 MG PO TABS
5.0000 mg | ORAL_TABLET | Freq: Once | ORAL | Status: AC
  Administered 2017-06-06: 5 mg via ORAL
  Filled 2017-06-05: qty 1

## 2017-06-05 MED ORDER — PLASMA-LYTE 148 IV SOLN
INTRAVENOUS | Status: AC
  Administered 2017-06-06: 500 mL
  Filled 2017-06-05: qty 2.5

## 2017-06-05 MED ORDER — NITROGLYCERIN IN D5W 200-5 MCG/ML-% IV SOLN
2.0000 ug/min | INTRAVENOUS | Status: AC
  Administered 2017-06-06: 5 ug/min via INTRAVENOUS
  Filled 2017-06-05: qty 250

## 2017-06-05 MED ORDER — DOPAMINE-DEXTROSE 3.2-5 MG/ML-% IV SOLN
0.0000 ug/kg/min | INTRAVENOUS | Status: DC
  Filled 2017-06-05: qty 250

## 2017-06-05 MED ORDER — TRANEXAMIC ACID 1000 MG/10ML IV SOLN
1.5000 mg/kg/h | INTRAVENOUS | Status: AC
  Administered 2017-06-06: 1.5 mg/kg/h via INTRAVENOUS
  Filled 2017-06-05: qty 25

## 2017-06-05 MED ORDER — BISACODYL 5 MG PO TBEC
5.0000 mg | DELAYED_RELEASE_TABLET | Freq: Once | ORAL | Status: DC
  Filled 2017-06-05: qty 1

## 2017-06-05 MED ORDER — LEVOFLOXACIN IN D5W 500 MG/100ML IV SOLN
500.0000 mg | INTRAVENOUS | Status: AC
  Administered 2017-06-06: 500 mg via INTRAVENOUS

## 2017-06-05 MED ORDER — MAGNESIUM SULFATE 50 % IJ SOLN
40.0000 meq | INTRAMUSCULAR | Status: DC
  Filled 2017-06-05: qty 10

## 2017-06-05 MED ORDER — DEXMEDETOMIDINE HCL IN NACL 400 MCG/100ML IV SOLN
0.1000 ug/kg/h | INTRAVENOUS | Status: DC
  Filled 2017-06-05: qty 100

## 2017-06-05 MED ORDER — TRANEXAMIC ACID (OHS) PUMP PRIME SOLUTION
2.0000 mg/kg | INTRAVENOUS | Status: DC
  Filled 2017-06-05: qty 1.21

## 2017-06-05 MED ORDER — TRANEXAMIC ACID (OHS) BOLUS VIA INFUSION
15.0000 mg/kg | INTRAVENOUS | Status: AC
  Administered 2017-06-06: 904.5 mg via INTRAVENOUS
  Filled 2017-06-05: qty 905

## 2017-06-05 MED ORDER — DEXTROSE 5 % IV SOLN
0.0000 ug/min | INTRAVENOUS | Status: DC
  Filled 2017-06-05: qty 4

## 2017-06-05 MED ORDER — CHLORHEXIDINE GLUCONATE CLOTH 2 % EX PADS
6.0000 | MEDICATED_PAD | Freq: Once | CUTANEOUS | Status: AC
  Administered 2017-06-05: 6 via TOPICAL

## 2017-06-05 MED ORDER — CHLORHEXIDINE GLUCONATE CLOTH 2 % EX PADS
6.0000 | MEDICATED_PAD | Freq: Once | CUTANEOUS | Status: AC
  Administered 2017-06-06: 6 via TOPICAL

## 2017-06-05 MED ORDER — METOPROLOL TARTRATE 12.5 MG HALF TABLET
12.5000 mg | ORAL_TABLET | Freq: Once | ORAL | Status: AC
  Administered 2017-06-06: 12.5 mg via ORAL
  Filled 2017-06-05: qty 1

## 2017-06-05 MED ORDER — POTASSIUM CHLORIDE 2 MEQ/ML IV SOLN
80.0000 meq | INTRAVENOUS | Status: DC
  Filled 2017-06-05: qty 40

## 2017-06-05 MED ORDER — SODIUM CHLORIDE 0.9 % IV SOLN
INTRAVENOUS | Status: AC
  Administered 2017-06-06: .4 [IU]/h via INTRAVENOUS
  Filled 2017-06-05: qty 1

## 2017-06-05 NOTE — Progress Notes (Signed)
Called Dr. Dorris Fetch office, was able to talk to Aurea Graff RN regarding pts concern about her severe allergy to  metal . MD's nurse stated that she will ask MD to drop by to talk to the patient. Patient made aware that MD will be coming by to talk to her.

## 2017-06-05 NOTE — Progress Notes (Signed)
Progress Note  Patient Name: Samantha Dyer Date of Encounter: 06/05/2017  Primary Cardiologist: Allyson Sabal  Subjective   No complaints. Planned for surgery in the am.   Inpatient Medications    Scheduled Meds: . amLODipine  5 mg Oral Daily  . aspirin  81 mg Oral Daily  . atorvastatin  80 mg Oral q1800  . bisacodyl  5 mg Oral Once  . bisoprolol  5 mg Oral Daily  . [START ON 06/06/2017] chlorhexidine  15 mL Mouth/Throat Once  . Chlorhexidine Gluconate Cloth  6 each Topical Once   And  . [START ON 06/06/2017] Chlorhexidine Gluconate Cloth  6 each Topical Once  . [START ON 06/06/2017] diazepam  5 mg Oral Once  . fluticasone furoate-vilanterol  1 puff Inhalation Daily  . furosemide  40 mg Oral Daily  . losartan  25 mg Oral Daily  . mouth rinse  15 mL Mouth Rinse BID  . [START ON 06/06/2017] metoprolol tartrate  12.5 mg Oral Once  . nicotine  21 mg Transdermal Daily  . pantoprazole  40 mg Oral QAC breakfast  . predniSONE  30 mg Oral Q breakfast  . senna-docusate  1 tablet Oral BID  . sodium chloride flush  3 mL Intravenous Q12H  . tiotropium  18 mcg Inhalation Daily   Continuous Infusions: . sodium chloride    . heparin 900 Units/hr (06/04/17 1349)   PRN Meds: sodium chloride, acetaminophen, albuterol, ALPRAZolam, ondansetron (ZOFRAN) IV, sodium chloride flush, temazepam   Vital Signs    Vitals:   06/04/17 2124 06/05/17 0503 06/05/17 0845 06/05/17 0925  BP: (!) 101/48 (!) 111/44  112/78  Pulse: 60 63    Resp:  17    Temp: 98 F (36.7 C) 98.1 F (36.7 C)    TempSrc: Oral Oral    SpO2: 100% 100% 100%   Weight:  133 lb (60.3 kg)    Height:        Intake/Output Summary (Last 24 hours) at 06/05/17 0950 Last data filed at 06/05/17 0905  Gross per 24 hour  Intake              819 ml  Output              900 ml  Net              -81 ml   Filed Weights   06/03/17 0519 06/04/17 0709 06/05/17 0503  Weight: 130 lb 6.4 oz (59.1 kg) 131 lb 8 oz (59.6 kg) 133 lb (60.3  kg)    Telemetry    SR with short run of NSVT - Personally Reviewed  Physical Exam   General: Well developed, well nourished, female appearing in no acute distress. Head: Normocephalic, atraumatic.  Neck: Supple without bruits, JVD. Lungs:  Resp regular and unlabored, CTA. Heart: RRR, S1, S2, no S3, S4, or murmur; no rub. Abdomen: Soft, non-tender, non-distended with normoactive bowel sounds. No hepatomegaly. No rebound/guarding. No obvious abdominal masses. Extremities: No clubbing, cyanosis, mild LE L>R edema. Distal pedal pulses are 2+ bilaterally. Neuro: Alert and oriented X 3. Moves all extremities spontaneously. Psych: Normal affect.  Labs    Chemistry Recent Labs Lab 06/03/17 0434 06/04/17 0604 06/05/17 0447  NA 129* 127* 129*  K 3.8 3.7 4.0  CL 87* 84* 87*  CO2 37* 35* 36*  GLUCOSE 95 89 88  BUN CREATININE 0.74 0.82 0.96  CALCIUM 8.6* 8.7* 8.4*  GFRNONAA >60 >60 >60  GFRAA >  60 >60 >60  ANIONGAP Hematology Recent Labs Lab 06/03/17 0434 06/04/17 0604 06/05/17 0447  WBC 5.4 6.2 6.1  RBC 3.47* 3.37* 3.29*  HGB 10.6* 10.2* 10.0*  HCT 31.7* 30.5* 30.0*  MCV 91.4 90.5 91.2  MCH 30.5 30.3 30.4  MCHC 33.4 33.4 33.3  RDW 13.6 13.5 13.2  PLT 121* 115* 111*    Cardiac Enzymes Recent Labs Lab 05/29/17 1429 05/30/17 0425  TROPONINI 0.97* 0.88*   No results for input(s): TROPIPOC in the last 168 hours.   BNPNo results for input(s): BNP, PROBNP in the last 168 hours.   DDimer No results for input(s): DDIMER in the last 168 hours.    Radiology    No results found.  Cardiac Studies   Cath: 06/01/17  Conclusion     Ost LAD lesion, 90 %stenosed. Complex, calcified trifurcation lesion with ramus.  Ost Ramus lesion, 90 %stenosed. The ramus has two large branches which cover the lateral wall. The circumflex is relatively small.  Ost RCA to Prox RCA lesion, 100 %stenosed. There are left to right collaterals.  Prox Cx  lesion, 70 %stenosed.  LV end diastolic pressure is normal.  The left ventricular ejection fraction is 35-45% by visual estimate.  There is no aortic valve stenosis.  Normal PA pressures; Ao 89%, PA 70%, CO 7.2 L/min; CI 4.4  Severe three vessel CAD. Plan for CVTS consult for CABG.   Restart heparin in 6 hours      TTE: 05/29/17  Study Conclusions  - Left ventricle: The cavity size was normal. There was mild focal basal hypertrophy of the septum. Systolic function was mildly to moderately reduced. The estimated ejection fraction was in the range of 40% to 45%. There is severe hypokinesis of the basal-midinferoseptal myocardium. There is akinesis of the basal-midinferior myocardium. There is hypokinesis of the basal-midanteroseptal myocardium. The study is not technically sufficient to allow evaluation of LV diastolic function. - Aortic valve: Poorly visualized. - Mitral valve: Calcified annulus. Mildly thickened leaflets . - Left atrium: The atrium was moderately dilated. - Atrial septum: There was increased thickness of the septum, consistent with lipomatous hypertrophy. - Pulmonic valve: There was trivial regurgitation.   Patient Profile     66 y.o. female with ongoing tobacco abuse, COPD with multiple admissions for exacerbation requiring intubation. We saw her yesterday because of mildly elevated troponins and a 2-D echo that showed new LV dysfunction with segmental wall motion abnormalities. She does complain of "heartburn" over the last month which has been worrying her. Underwent cath with multivessel disease, now planned for CABG.    Assessment & Plan    1. NSTEMI: cath, noted above with severe 3 v disease. Seen by TCTS and undergoing work up for CABG now planned for 10/17. Remains on IV heparin, no chest pain. Continues to walk the hallway multiple times a day.  -- on ASA, BB, statin, ARB  2. ICM: EF noted at 40-45% on echo. On lasix   PO daily. Appears volume stable on exam.   3. COPD: Pulmonary following. PFTs done yesterday. Noted to be high risk for pulmonary complications, but not prohibitive regarding surgery.   4. HL: on high dose statin  5. Hypokalemia: resolved  6. Tobacco use: cessation advised, but patient reports that she is not ready to quit. Now wearing nicotine patch.    Signed, Laverda Page, NP  06/05/2017, 9:50 AM  Pager # [phone]   For questions or updates, please  contact [facility] Please consult [email or web address] for contact info under Cardiology/STEMI. Daytime calls, contact the Day Call APP (6a-8a) or assigned team (Teams A-D) provider (7:30a - 5p). All other daytime calls (7:30-5p), contact the Card Master @ [phone].   Nighttime calls, contact the assigned APP (5p-8p) or MD (6:30p-8p). Overnight calls (8p-6a), contact the on call Fellow @ [phone].  I have personally seen and examined this patient. I agree with the assessment and plan as outlined above.  She is doing well. She is awaiting bypass surgery tomorrow. No chest pain. No changes today.   Verne Carrow 06/05/2017 11:23 AM

## 2017-06-05 NOTE — Progress Notes (Signed)
ANTICOAGULATION CONSULT NOTE - Follow Up Consult  Pharmacy Consult for Heparin  Indication: chest pain/ACS, s/p cath, possible CABG   Allergies  Allergen Reactions  . Augmentin [Amoxicillin-Pot Clavulanate] Swelling    Patient Measurements: Height:  (162.6 cm) Weight: 133 lb (60.3 kg) (b scale) IBW/kg (Calculated) : 54.7  Vital Signs: Temp: 98.1 F (36.7 C) (10/16 0503) Temp Source: Oral (10/16 0503) BP: 112/78 (10/16 0925) Pulse Rate: 63 (10/16 0503)  Labs:  Recent Labs  06/03/17 0434 06/04/17 0604 06/05/17 0447  HGB 10.6* 10.2* 10.0*  HCT 31.7* 30.5* 30.0*  PLT 121* 115* 111*  HEPARINUNFRC 0.41 0.41 0.29*  CREATININE 0.74 0.82 0.96    Estimated Creatinine Clearance: 50.5 mL/min (by C-G formula based on SCr of 0.96 mg/dL).   Assessment: 66 yof continuing on heparin while waiting for CABG on 10/17. Heparin was on hold from ~10:30am until ~ 11am so she could take a shower.  -heparin level slightly below goal   Goal of Therapy:  Heparin level 0.3-0.7 units/ml Monitor platelets by anticoagulation protocol: Yes   Plan:  Increase heparin to 1000 units/hr  Daily heparin level/CBC  Harland German, Pharm D 06/05/2017 11:14 AM

## 2017-06-05 NOTE — Care Management Important Message (Signed)
Important Message  Patient Details  Name: Samantha Dyer MRN: [id number] Date of Birth: 06/25/1951   Medicare Important Message Given:  Yes    Loura Pitt 06/05/2017, 12:25 PM

## 2017-06-05 NOTE — Progress Notes (Signed)
Patient refused senna, reports multiple bowel movements.

## 2017-06-05 NOTE — Progress Notes (Signed)
CARDIAC REHAB PHASE I   Pt ambulating independently, no complaints. Completed cardiac surgery pre-op education with pt. Reviewed IS, activity progression, sternal precautions, cardiac surgery booklet and cardiac surgery guidelines. Pt verbalized understanding, declines cardiac surgery videos. Pt states she does have a severe allergy to most metals, notified RN, advised pt to discuss this with surgeon. Pt sitting in chair, call bell within reach. Will follow post-op.   [phone] Joylene Grapes, RN, BSN 06/05/2017 2:53 PM

## 2017-06-05 NOTE — Progress Notes (Signed)
Patient ID: Samantha Dyer, female   DOB: 23-Oct-1950, 66 y.o.   MRN: [id number]  PROGRESS NOTE    Gillian Meeuwsen  BJY:[id number] DOB: [birth year] DOA: 05/28/2017 PCP: No primary care provider on file.   Brief Narrative:  66 year old female with history of ongoing tobacco abuse, chronic hypoxic respiratory failure on home oxygen, COPD Gold IV and diastolic heart failure was admitted on 05/28/2017 for respiratory failure and was intubated for probable acute COPD exacerbation along with acute on chronic diastolic heart failure with possibility for pneumonia and started on IV diuretics, steroids and antibiotics. Patient was subsequently extubated. IV Solu-Medrol was switched to oral prednisone. She was found to have EF of 40-45%. Cardiology was consulted. Patient had cardiac catheterization on 06/01/2017 and was found to have severe multivessel coronary artery disease. Cardiaothoracic surgery was consulted. Patient was transferred out to the floor and hospitalist service took over the care from 06/02/2017.  Assessment & Plan:   Active Problems:   Acute on chronic respiratory failure with hypoxia and hypercapnia (HCC)   COPD exacerbation (HCC)   Acute respiratory failure with hypoxia and hypercapnia (HCC)   Acute diastolic (congestive) heart failure (HCC)   Hyponatremia   Non-ST elevation (NSTEMI) myocardial infarction (HCC)   Systolic congestive heart failure (HCC)   Ischemic cardiomyopathy   Hypokalemia   Pure hyperglyceridemia  Acute on chronic hypoxic/hypercarbic respiratory failure - Status post extubation. Continue oxygen supplementation. Pulmonary following  COPD exacerbation - Continue DuoNeb and Breo and Spiriva. decrease prednisone to 20 mg by mouth daily and continue taper - Antibiotics have been discontinued by pulmonary as they don't think that patient had pneumonia  Multivessel coronary artery disease with non-STEMI - Cardiology and cardiaothoracic surgery following - Continue  aspirin, statin and bisoprolol - probable CABG planned for tomorrow   Acute on chronic systolic and diastolic heart failure - Patient has new systolic heart failure with ejection fraction of 40-45%. Continue Lasix. Strict Input and output; daily weights. Negative fluid balance of 8221.6 mL since admission - Cardiology following  Chronic tobacco abuse - Counseled about tobacco cessation.   Hyponatremia -Sodium is 129 today. Repeat labs in a.m.  Strict input and output. Continue Lasix for today  Hypokalemia -improved  Hyperlipidemia - Continue statin  Thrombocytopenia - questionable cause. Monitor   DVT prophylaxis: Heparin Code Status:  Full Family Communication: none present at bedside Disposition Plan: Depends on clinical outcome  Consultants: Cardiology, cardiothoracic surgery, PCCM  Procedures:  2-D echocardiogram (05/29/17)  Study Conclusions  - Left ventricle: The cavity size was normal. There was mild focal basal hypertrophy of the septum. Systolic function was mildly to moderately reduced. The estimated ejection fraction was in the range of 40% to 45%. There is severe hypokinesis of the basal-midinferoseptal myocardium. There is akinesis of the basal-midinferior myocardium. There is hypokinesis of the basal-midanteroseptal myocardium. The study is not technically sufficient to allow evaluation of LV diastolic function. - Aortic valve: Poorly visualized. - Mitral valve: Calcified annulus. Mildly thickened leaflets . - Left atrium: The atrium was moderately dilated. - Atrial septum: There was increased thickness of the septum, consistent with lipomatous hypertrophy. - Pulmonic valve: There was trivial regurgitation.   Cardiac Cath Conclusion 06/01/2017    Ost LAD lesion, 90 %stenosed. Complex, calcified trifurcation lesion with ramus.  Ost Ramus lesion, 90 %stenosed. The ramus has two large branches which cover the lateral wall. The  circumflex is relatively small.  Ost RCA to Prox RCA lesion, 100 %stenosed. There are left to right collaterals.  Prox Cx lesion, 70 %stenosed.  LV end diastolic pressure is normal.  The left ventricular ejection fraction is 35-45% by visual estimate.  There is no aortic valve stenosis.  Normal PA pressures; Ao 89%, PA 70%, CO 7.2 L/min; CI 4.4  Severe three vessel CAD. Plan for CVTS consult for CABG.      Antimicrobials: Levaquin from 05/28/17 - 06/01/2017   Subjective: Patient seen and examined at bedside. No overnight fever, nausea or vomiting.  No current chest pain  Objective: Vitals:   06/04/17 2124 06/05/17 0503 06/05/17 0845 06/05/17 0925  BP: (!) 101/48 (!) 111/44  112/78  Pulse: 60 63    Resp:  17    Temp: 98 F (36.7 C) 98.1 F (36.7 C)    TempSrc: Oral Oral    SpO2: 100% 100% 100%   Weight:  60.3 kg (133 lb)    Height:        Intake/Output Summary (Last 24 hours) at 06/05/17 1025 Last data filed at 06/05/17 0905  Gross per 24 hour  Intake              819 ml  Output              900 ml  Net              -81 ml   Filed Weights   06/03/17 0519 06/04/17 0709 06/05/17 0503  Weight: 59.1 kg (130 lb 6.4 oz) 59.6 kg (131 lb 8 oz) 60.3 kg (133 lb)    Examination:  General exam: Appears calm and comfortable  Respiratory system: Bilateral decreased breath sound at bases With basilar crackles Cardiovascular system: S1 & S2 heard, rate controlled  Gastrointestinal system: Abdomen is nondistended, soft and nontender. Normal bowel sounds heard. Extremities: No cyanosis, clubbing, trace edema   Data Reviewed: I have personally reviewed following labs and imaging studies  CBC:  Recent Labs Lab 06/01/17 0311 06/02/17 0239 06/03/17 0434 06/04/17 0604 06/05/17 0447  WBC 10.0 7.1 5.4 6.2 6.1  HGB 11.5* 11.4* 10.6* 10.2* 10.0*  HCT 36.0 34.9* 31.7* 30.5* 30.0*  MCV 93.8 92.1 91.4 90.5 91.2  PLT 128* 139* 121* 115* 111*   Basic Metabolic  Panel:  Recent Labs Lab 05/30/17 0425  05/31/17 0346 06/02/17 0239 06/03/17 0434 06/04/17 0604 06/05/17 0447  NA 130*  < > 133* 130* 129* 127* 129*  K 3.0*  < > 3.8 3.1* 3.8 3.7 4.0  CL 90*  < > 89* 87* 87* 84* 87*  CO2 29  < > 36* 35* 37* 35* 36*  GLUCOSE 137*  < > 107* 87 95 89 88  BUN 12  < > CREATININE 0.84  < > 0.79 0.76 0.74 0.82 0.96  CALCIUM 8.6*  < > 8.7* 8.4* 8.6* 8.7* 8.4*  MG 1.9  --   --  1.8 1.8 1.8 1.7  PHOS 3.3  --   --   --   --   --   --   < > = values in this interval not displayed. GFR: Estimated Creatinine Clearance: 50.5 mL/min (by C-G formula based on SCr of 0.96 mg/dL). Liver Function Tests: No results for input(s): AST, ALT, ALKPHOS, BILITOT, PROT, ALBUMIN in the last 168 hours. No results for input(s): LIPASE, AMYLASE in the last 168 hours. No results for input(s): AMMONIA in the last 168 hours. Coagulation Profile:  Recent Labs Lab 06/01/17 0311  INR 1.09   Cardiac Enzymes:  Recent  Labs Lab 05/29/17 1429 05/30/17 0425  TROPONINI 0.97* 0.88*   BNP (last 3 results) No results for input(s): PROBNP in the last 8760 hours. HbA1C: No results for input(s): HGBA1C in the last 72 hours. CBG:  Recent Labs Lab 05/30/17 1538 05/30/17 2026 05/30/17 2349 05/31/17 0352 05/31/17 0747  GLUCAP 143* 151* 113* 106* 128*   Lipid Profile: No results for input(s): CHOL, HDL, LDLCALC, TRIG, CHOLHDL, LDLDIRECT in the last 72 hours. Thyroid Function Tests: No results for input(s): TSH, T4TOTAL, FREET4, T3FREE, THYROIDAB in the last 72 hours. Anemia Panel: No results for input(s): VITAMINB12, FOLATE, FERRITIN, TIBC, IRON, RETICCTPCT in the last 72 hours. Sepsis Labs: No results for input(s): PROCALCITON, LATICACIDVEN in the last 168 hours.  Recent Results (from the past 240 hour(s))  MRSA PCR Screening     Status: None   Collection Time: 05/29/17  5:39 AM  Result Value Ref Range Status   MRSA by PCR NEGATIVE NEGATIVE Final     Comment:        The GeneXpert MRSA Assay (FDA approved for NASAL specimens only), is one component of a comprehensive MRSA colonization surveillance program. It is not intended to diagnose MRSA infection nor to guide or monitor treatment for MRSA infections.   Culture, blood (routine x 2)     Status: None   Collection Time: 05/29/17  5:49 AM  Result Value Ref Range Status   Specimen Description BLOOD LEFT HAND  Final   Special Requests   Final    BOTTLES DRAWN AEROBIC AND ANAEROBIC Blood Culture adequate volume   Culture NO GROWTH 5 DAYS  Final   Report Status 06/03/2017 FINAL  Final  Culture, blood (routine x 2)     Status: None   Collection Time: 05/29/17  5:55 AM  Result Value Ref Range Status   Specimen Description BLOOD RIGHT HAND  Final   Special Requests   Final    BOTTLES DRAWN AEROBIC ONLY Blood Culture adequate volume   Culture NO GROWTH 5 DAYS  Final   Report Status 06/03/2017 FINAL  Final  Respiratory Panel by PCR     Status: None   Collection Time: 05/30/17 11:31 AM  Result Value Ref Range Status   Adenovirus NOT DETECTED NOT DETECTED Final   Coronavirus 229E NOT DETECTED NOT DETECTED Final   Coronavirus HKU1 NOT DETECTED NOT DETECTED Final   Coronavirus NL63 NOT DETECTED NOT DETECTED Final   Coronavirus OC43 NOT DETECTED NOT DETECTED Final   Metapneumovirus NOT DETECTED NOT DETECTED Final   Rhinovirus / Enterovirus NOT DETECTED NOT DETECTED Final   Influenza A NOT DETECTED NOT DETECTED Final   Influenza B NOT DETECTED NOT DETECTED Final   Parainfluenza Virus 1 NOT DETECTED NOT DETECTED Final   Parainfluenza Virus 2 NOT DETECTED NOT DETECTED Final   Parainfluenza Virus 3 NOT DETECTED NOT DETECTED Final   Parainfluenza Virus 4 NOT DETECTED NOT DETECTED Final   Respiratory Syncytial Virus NOT DETECTED NOT DETECTED Final   Bordetella pertussis NOT DETECTED NOT DETECTED Final   Chlamydophila pneumoniae NOT DETECTED NOT DETECTED Final   Mycoplasma pneumoniae NOT  DETECTED NOT DETECTED Final         Radiology Studies: No results found.      Scheduled Meds: . amLODipine  5 mg Oral Daily  . aspirin  81 mg Oral Daily  . atorvastatin  80 mg Oral q1800  . bisacodyl  5 mg Oral Once  . bisoprolol  5 mg Oral Daily  . [START  ON 06/06/2017] chlorhexidine  15 mL Mouth/Throat Once  . Chlorhexidine Gluconate Cloth  6 each Topical Once   And  . [START ON 06/06/2017] Chlorhexidine Gluconate Cloth  6 each Topical Once  . [START ON 06/06/2017] diazepam  5 mg Oral Once  . fluticasone furoate-vilanterol  1 puff Inhalation Daily  . furosemide  40 mg Oral Daily  . losartan  25 mg Oral Daily  . mouth rinse  15 mL Mouth Rinse BID  . [START ON 06/06/2017] metoprolol tartrate  12.5 mg Oral Once  . nicotine  21 mg Transdermal Daily  . pantoprazole  40 mg Oral QAC breakfast  . predniSONE  30 mg Oral Q breakfast  . senna-docusate  1 tablet Oral BID  . sodium chloride flush  3 mL Intravenous Q12H  . tiotropium  18 mcg Inhalation Daily   Continuous Infusions: . sodium chloride    . heparin 900 Units/hr (06/04/17 1349)     LOS: 7 days        Glade Lloyd, MD [facility] Pager [phone]  If 7PM-7AM, please contact night-coverage [email or web address] Password [email or web address] 06/05/2017, 10:25 AM

## 2017-06-05 NOTE — Anesthesia Preprocedure Evaluation (Addendum)
Anesthesia Evaluation  Patient identified by MRN, date of birth, ID band Patient awake    Reviewed: Allergy & Precautions, NPO status , Patient's Chart, lab work & pertinent test results  Airway Mallampati: II  TM Distance: >3 FB Neck ROM: Full    Dental no notable dental hx.    Pulmonary COPD, Current Smoker,    Pulmonary exam normal breath sounds clear to auscultation       Cardiovascular + Past MI and +CHF  Normal cardiovascular exam Rhythm:Regular Rate:Normal     Neuro/Psych negative neurological ROS  negative psych ROS   GI/Hepatic negative GI ROS, Neg liver ROS,   Endo/Other  negative endocrine ROS  Renal/GU negative Renal ROS     Musculoskeletal negative musculoskeletal ROS (+)   Abdominal   Peds  Hematology negative hematology ROS (+)   Anesthesia Other Findings   Reproductive/Obstetrics                            Anesthesia Physical Anesthesia Plan  ASA: IV  Anesthesia Plan: General   Post-op Pain Management:    Induction: Intravenous  PONV Risk Score and Plan: 3 and Midazolam and Treatment may vary due to age or medical condition  Airway Management Planned: Oral ETT  Additional Equipment: Arterial line, CVP, PA Cath, TEE and Ultrasound Guidance Line Placement  Intra-op Plan:   Post-operative Plan: Post-operative intubation/ventilation  Informed Consent: I have reviewed the patients History and Physical, chart, labs and discussed the procedure including the risks, benefits and alternatives for the proposed anesthesia with the patient or authorized representative who has indicated his/her understanding and acceptance.   Dental advisory given  Plan Discussed with: CRNA  Anesthesia Plan Comments:        Anesthesia Quick Evaluation

## 2017-06-06 ENCOUNTER — Inpatient Hospital Stay (HOSPITAL_COMMUNITY): Payer: Medicare Other

## 2017-06-06 ENCOUNTER — Encounter (HOSPITAL_COMMUNITY)
Admission: EM | Disposition: A | Discharge status: Home Health | Payer: Self-pay | Source: Home / Self Care | Attending: Thoracic Surgery (Cardiothoracic Vascular Surgery)

## 2017-06-06 ENCOUNTER — Inpatient Hospital Stay (HOSPITAL_COMMUNITY): Payer: Medicare Other | Admitting: Anesthesiology

## 2017-06-06 ENCOUNTER — Encounter (HOSPITAL_COMMUNITY): Payer: Self-pay | Admitting: *Deleted

## 2017-06-06 DIAGNOSIS — Z951 Presence of aortocoronary bypass graft: Secondary | ICD-10-CM

## 2017-06-06 DIAGNOSIS — I25110 Atherosclerotic heart disease of native coronary artery with unstable angina pectoris: Secondary | ICD-10-CM

## 2017-06-06 HISTORY — PX: TEE WITHOUT CARDIOVERSION: SHX5443

## 2017-06-06 HISTORY — PX: CORONARY ARTERY BYPASS GRAFT: SHX141

## 2017-06-06 LAB — POCT I-STAT, CHEM 8
BUN: 15 mg/dL (ref 6–20)
BUN: 14 mg/dL (ref 6–20)
BUN: 12 mg/dL (ref 6–20)
BUN: 13 mg/dL (ref 6–20)
BUN: 14 mg/dL (ref 6–20)
BUN: 12 mg/dL (ref 6–20)
CALCIUM ION: 1 mmol/L — AB (ref 1.15–1.40)
CALCIUM ION: 1.08 mmol/L — AB (ref 1.15–1.40)
CALCIUM ION: 1.17 mmol/L (ref 1.15–1.40)
CHLORIDE: 87 mmol/L — AB (ref 101–111)
CHLORIDE: 86 mmol/L — AB (ref 101–111)
CHLORIDE: 84 mmol/L — AB (ref 101–111)
CHLORIDE: 92 mmol/L — AB (ref 101–111)
CREATININE: 0.6 mg/dL (ref 0.44–1.00)
CREATININE: 0.5 mg/dL (ref 0.44–1.00)
CREATININE: 0.6 mg/dL (ref 0.44–1.00)
CREATININE: 0.6 mg/dL (ref 0.44–1.00)
Calcium, Ion: 1.21 mmol/L (ref 1.15–1.40)
Calcium, Ion: 1.23 mmol/L (ref 1.15–1.40)
Calcium, Ion: 1.07 mmol/L — ABNORMAL LOW (ref 1.15–1.40)
Chloride: 87 mmol/L — ABNORMAL LOW (ref 101–111)
Chloride: 90 mmol/L — ABNORMAL LOW (ref 101–111)
Creatinine, Ser: 0.7 mg/dL (ref 0.44–1.00)
Creatinine, Ser: 0.7 mg/dL (ref 0.44–1.00)
GLUCOSE: 85 mg/dL (ref 65–99)
GLUCOSE: 108 mg/dL — AB (ref 65–99)
GLUCOSE: 78 mg/dL (ref 65–99)
GLUCOSE: 108 mg/dL — AB (ref 65–99)
Glucose, Bld: 82 mg/dL (ref 65–99)
Glucose, Bld: 100 mg/dL — ABNORMAL HIGH (ref 65–99)
HCT: 24 % — ABNORMAL LOW (ref 36.0–46.0)
HCT: 21 % — ABNORMAL LOW (ref 36.0–46.0)
HEMATOCRIT: 27 % — AB (ref 36.0–46.0)
HEMATOCRIT: 26 % — AB (ref 36.0–46.0)
HEMATOCRIT: 20 % — AB (ref 36.0–46.0)
HEMATOCRIT: 16 % — AB (ref 36.0–46.0)
HEMOGLOBIN: 8.8 g/dL — AB (ref 12.0–15.0)
HEMOGLOBIN: 6.8 g/dL — AB (ref 12.0–15.0)
HEMOGLOBIN: 5.4 g/dL — AB (ref 12.0–15.0)
Hemoglobin: 9.2 g/dL — ABNORMAL LOW (ref 12.0–15.0)
Hemoglobin: 8.2 g/dL — ABNORMAL LOW (ref 12.0–15.0)
Hemoglobin: 7.1 g/dL — ABNORMAL LOW (ref 12.0–15.0)
POTASSIUM: 3.3 mmol/L — AB (ref 3.5–5.1)
POTASSIUM: 3.5 mmol/L (ref 3.5–5.1)
POTASSIUM: 3.1 mmol/L — AB (ref 3.5–5.1)
POTASSIUM: 4.7 mmol/L (ref 3.5–5.1)
Potassium: 4.7 mmol/L (ref 3.5–5.1)
Potassium: 3.8 mmol/L (ref 3.5–5.1)
SODIUM: 131 mmol/L — AB (ref 135–145)
SODIUM: 130 mmol/L — AB (ref 135–145)
SODIUM: 132 mmol/L — AB (ref 135–145)
Sodium: 130 mmol/L — ABNORMAL LOW (ref 135–145)
Sodium: 132 mmol/L — ABNORMAL LOW (ref 135–145)
Sodium: 133 mmol/L — ABNORMAL LOW (ref 135–145)
TCO2: 33 mmol/L — ABNORMAL HIGH (ref 22–32)
TCO2: 37 mmol/L — ABNORMAL HIGH (ref 22–32)
TCO2: 35 mmol/L — ABNORMAL HIGH (ref 22–32)
TCO2: 35 mmol/L — AB (ref 22–32)
TCO2: 35 mmol/L — AB (ref 22–32)
TCO2: 28 mmol/L (ref 22–32)

## 2017-06-06 LAB — POCT I-STAT 3, ART BLOOD GAS (G3+)
ACID-BASE EXCESS: 12 mmol/L — AB (ref 0.0–2.0)
ACID-BASE EXCESS: 15 mmol/L — AB (ref 0.0–2.0)
ACID-BASE EXCESS: 3 mmol/L — AB (ref 0.0–2.0)
Acid-Base Excess: 15 mmol/L — ABNORMAL HIGH (ref 0.0–2.0)
Acid-Base Excess: 7 mmol/L — ABNORMAL HIGH (ref 0.0–2.0)
Acid-Base Excess: 2 mmol/L (ref 0.0–2.0)
BICARBONATE: 28.6 mmol/L — AB (ref 20.0–28.0)
BICARBONATE: 32.7 mmol/L — AB (ref 20.0–28.0)
BICARBONATE: 40.5 mmol/L — AB (ref 20.0–28.0)
Bicarbonate: 38.5 mmol/L — ABNORMAL HIGH (ref 20.0–28.0)
Bicarbonate: 37.2 mmol/L — ABNORMAL HIGH (ref 20.0–28.0)
Bicarbonate: 29.3 mmol/L — ABNORMAL HIGH (ref 20.0–28.0)
O2 SAT: 100 %
O2 Saturation: 100 %
O2 Saturation: 100 %
O2 Saturation: 100 %
O2 Saturation: 89 %
O2 Saturation: 99 %
PCO2 ART: 47.6 mmHg (ref 32.0–48.0)
PCO2 ART: 63.5 mmHg — AB (ref 32.0–48.0)
PH ART: 7.269 — AB (ref 7.350–7.450)
PH ART: 7.378 (ref 7.350–7.450)
PH ART: 7.439 (ref 7.350–7.450)
PH ART: 7.475 — AB (ref 7.350–7.450)
PO2 ART: 126 mmHg — AB (ref 83.0–108.0)
PO2 ART: 183 mmHg — AB (ref 83.0–108.0)
PO2 ART: 373 mmHg — AB (ref 83.0–108.0)
Patient temperature: 35.5
TCO2: 30 mmol/L (ref 22–32)
TCO2: 31 mmol/L (ref 22–32)
TCO2: 34 mmol/L — AB (ref 22–32)
TCO2: 39 mmol/L — ABNORMAL HIGH (ref 22–32)
TCO2: 40 mmol/L — ABNORMAL HIGH (ref 22–32)
TCO2: 42 mmol/L — AB (ref 22–32)
pCO2 arterial: 40.1 mmHg (ref 32.0–48.0)
pCO2 arterial: 48.6 mmHg — ABNORMAL HIGH (ref 32.0–48.0)
pCO2 arterial: 55 mmHg — ABNORMAL HIGH (ref 32.0–48.0)
pCO2 arterial: 55.3 mmHg — ABNORMAL HIGH (ref 32.0–48.0)
pH, Arterial: 7.59 — ABNORMAL HIGH (ref 7.350–7.450)
pH, Arterial: 7.435 (ref 7.350–7.450)
pO2, Arterial: 453 mmHg — ABNORMAL HIGH (ref 83.0–108.0)
pO2, Arterial: 442 mmHg — ABNORMAL HIGH (ref 83.0–108.0)
pO2, Arterial: 65 mmHg — ABNORMAL LOW (ref 83.0–108.0)

## 2017-06-06 LAB — BASIC METABOLIC PANEL
Anion gap: 8 (ref 5–15)
Anion gap: 7 (ref 5–15)
BUN: 16 mg/dL (ref 6–20)
BUN: 18 mg/dL (ref 6–20)
CALCIUM: 8.6 mg/dL — AB (ref 8.9–10.3)
CHLORIDE: 87 mmol/L — AB (ref 101–111)
CO2: 32 mmol/L (ref 22–32)
CO2: 34 mmol/L — AB (ref 22–32)
CREATININE: 0.9 mg/dL (ref 0.44–1.00)
Calcium: 8.3 mg/dL — ABNORMAL LOW (ref 8.9–10.3)
Chloride: 89 mmol/L — ABNORMAL LOW (ref 101–111)
Creatinine, Ser: 0.84 mg/dL (ref 0.44–1.00)
GFR calc Af Amer: >60 mL/min (ref >60)
GFR calc Af Amer: >60 mL/min (ref >60)
GFR calc non Af Amer: >60 mL/min (ref >60)
GFR calc non Af Amer: >60 mL/min (ref >60)
Glucose, Bld: 125 mg/dL — ABNORMAL HIGH (ref 65–99)
Glucose, Bld: 84 mg/dL (ref 65–99)
Potassium: 3.9 mmol/L (ref 3.5–5.1)
Potassium: 3.4 mmol/L — ABNORMAL LOW (ref 3.5–5.1)
Sodium: 127 mmol/L — ABNORMAL LOW (ref 135–145)
Sodium: 130 mmol/L — ABNORMAL LOW (ref 135–145)

## 2017-06-06 LAB — PLATELET COUNT: PLATELETS: 108 10*3/uL — AB (ref 150–400)

## 2017-06-06 LAB — PROTIME-INR
INR: 1.43
INR: 1.57
PROTHROMBIN TIME: 18.7 s — AB (ref 11.4–15.2)
Prothrombin Time: 17.3 seconds — ABNORMAL HIGH (ref 11.4–15.2)

## 2017-06-06 LAB — CREATININE, SERUM
CREATININE: 0.69 mg/dL (ref 0.44–1.00)
GFR calc Af Amer: >60 mL/min (ref >60)

## 2017-06-06 LAB — CBC
HCT: 21.7 % — ABNORMAL LOW (ref 36.0–46.0)
HCT: 31.3 % — ABNORMAL LOW (ref 36.0–46.0)
HEMATOCRIT: 31.6 % — AB (ref 36.0–46.0)
HEMATOCRIT: 19.3 % — AB (ref 36.0–46.0)
HEMOGLOBIN: 10.6 g/dL — AB (ref 12.0–15.0)
HEMOGLOBIN: 6.6 g/dL — AB (ref 12.0–15.0)
Hemoglobin: 10.7 g/dL — ABNORMAL LOW (ref 12.0–15.0)
Hemoglobin: 7.4 g/dL — ABNORMAL LOW (ref 12.0–15.0)
MCH: 30.6 pg (ref 26.0–34.0)
MCH: 30.7 pg (ref 26.0–34.0)
MCH: 30.7 pg (ref 26.0–34.0)
MCH: 30.8 pg (ref 26.0–34.0)
MCHC: 33.9 g/dL (ref 30.0–36.0)
MCHC: 34.1 g/dL (ref 30.0–36.0)
MCHC: 34.2 g/dL (ref 30.0–36.0)
MCHC: 33.9 g/dL (ref 30.0–36.0)
MCV: 90 fL (ref 78.0–100.0)
MCV: 90.2 fL (ref 78.0–100.0)
MCV: 90.5 fL (ref 78.0–100.0)
MCV: 90.5 fL (ref 78.0–100.0)
PLATELETS: 109 10*3/uL — AB (ref 150–400)
Platelets: 123 10*3/uL — ABNORMAL LOW (ref 150–400)
Platelets: 141 10*3/uL — ABNORMAL LOW (ref 150–400)
Platelets: 148 10*3/uL — ABNORMAL LOW (ref 150–400)
RBC: 2.14 MIL/uL — ABNORMAL LOW (ref 3.87–5.11)
RBC: 2.41 MIL/uL — ABNORMAL LOW (ref 3.87–5.11)
RBC: 3.46 MIL/uL — ABNORMAL LOW (ref 3.87–5.11)
RBC: 3.49 MIL/uL — ABNORMAL LOW (ref 3.87–5.11)
RDW: 13.3 % (ref 11.5–15.5)
RDW: 13.3 % (ref 11.5–15.5)
RDW: 13.4 % (ref 11.5–15.5)
RDW: 13.5 % (ref 11.5–15.5)
WBC: 18.1 10*3/uL — ABNORMAL HIGH (ref 4.0–10.5)
WBC: 22.5 10*3/uL — ABNORMAL HIGH (ref 4.0–10.5)
WBC: 7.4 10*3/uL (ref 4.0–10.5)
WBC: 8 10*3/uL (ref 4.0–10.5)

## 2017-06-06 LAB — GLUCOSE, CAPILLARY
GLUCOSE-CAPILLARY: 103 mg/dL — AB (ref 65–99)
GLUCOSE-CAPILLARY: 129 mg/dL — AB (ref 65–99)
GLUCOSE-CAPILLARY: 93 mg/dL (ref 65–99)
Glucose-Capillary: 81 mg/dL (ref 65–99)
Glucose-Capillary: 98 mg/dL (ref 65–99)

## 2017-06-06 LAB — SURGICAL PCR SCREEN
MRSA, PCR: NEGATIVE
STAPHYLOCOCCUS AUREUS: NEGATIVE

## 2017-06-06 LAB — PREPARE RBC (CROSSMATCH)

## 2017-06-06 LAB — MAGNESIUM
MAGNESIUM: 1.7 mg/dL (ref 1.7–2.4)
Magnesium: 2.6 mg/dL — ABNORMAL HIGH (ref 1.7–2.4)

## 2017-06-06 LAB — APTT
aPTT: 34 seconds (ref 24–36)
aPTT: 36 seconds (ref 24–36)

## 2017-06-06 LAB — HEMOGLOBIN AND HEMATOCRIT, BLOOD
HCT: 21.4 % — ABNORMAL LOW (ref 36.0–46.0)
HCT: 25.7 % — ABNORMAL LOW (ref 36.0–46.0)
Hemoglobin: 7.4 g/dL — ABNORMAL LOW (ref 12.0–15.0)
Hemoglobin: 8.9 g/dL — ABNORMAL LOW (ref 12.0–15.0)

## 2017-06-06 LAB — POCT I-STAT 4, (NA,K, GLUC, HGB,HCT)
GLUCOSE: 86 mg/dL (ref 65–99)
HCT: 22 % — ABNORMAL LOW (ref 36.0–46.0)
Hemoglobin: 7.5 g/dL — ABNORMAL LOW (ref 12.0–15.0)
Potassium: 3.7 mmol/L (ref 3.5–5.1)
Sodium: 134 mmol/L — ABNORMAL LOW (ref 135–145)

## 2017-06-06 LAB — HEPARIN LEVEL (UNFRACTIONATED): Heparin Unfractionated: 0.58 IU/mL (ref 0.30–0.70)

## 2017-06-06 SURGERY — CORONARY ARTERY BYPASS GRAFTING (CABG)
Anesthesia: General | Site: Chest

## 2017-06-06 MED ORDER — PROTAMINE SULFATE 10 MG/ML IV SOLN
INTRAVENOUS | Status: AC
  Filled 2017-06-06: qty 5

## 2017-06-06 MED ORDER — MIDAZOLAM HCL 10 MG/2ML IJ SOLN
INTRAMUSCULAR | Status: AC
  Filled 2017-06-06: qty 2

## 2017-06-06 MED ORDER — SODIUM CHLORIDE 0.9% FLUSH
3.0000 mL | Freq: Two times a day (BID) | INTRAVENOUS | Status: DC
  Administered 2017-06-07 – 2017-06-11 (×8): 3 mL via INTRAVENOUS

## 2017-06-06 MED ORDER — ACETAMINOPHEN 500 MG PO TABS
1000.0000 mg | ORAL_TABLET | Freq: Four times a day (QID) | ORAL | Status: AC
  Administered 2017-06-07 – 2017-06-11 (×16): 1000 mg via ORAL
  Filled 2017-06-06 (×17): qty 2

## 2017-06-06 MED ORDER — SODIUM CHLORIDE 0.9% FLUSH
3.0000 mL | INTRAVENOUS | Status: DC | PRN
Start: 2017-06-07 — End: 2017-06-11

## 2017-06-06 MED ORDER — SODIUM CHLORIDE 0.9 % IJ SOLN
INTRAMUSCULAR | Status: AC
  Filled 2017-06-06: qty 10

## 2017-06-06 MED ORDER — SODIUM CHLORIDE 0.9 % IV SOLN
Freq: Once | INTRAVENOUS | Status: AC
  Administered 2017-06-06: 21:00:00 via INTRAVENOUS

## 2017-06-06 MED ORDER — METOPROLOL TARTRATE 12.5 MG HALF TABLET
12.5000 mg | ORAL_TABLET | Freq: Two times a day (BID) | ORAL | Status: DC
  Administered 2017-06-08 – 2017-06-09 (×3): 12.5 mg via ORAL
  Filled 2017-06-06 (×4): qty 1

## 2017-06-06 MED ORDER — LACTATED RINGERS IV SOLN: INTRAVENOUS | Status: DC

## 2017-06-06 MED ORDER — ROCURONIUM BROMIDE 100 MG/10ML IV SOLN
INTRAVENOUS | Status: DC | PRN
  Administered 2017-06-06 (×2): 10 mg via INTRAVENOUS
  Administered 2017-06-06: 100 mg via INTRAVENOUS

## 2017-06-06 MED ORDER — CHLORHEXIDINE GLUCONATE 0.12 % MT SOLN
15.0000 mL | OROMUCOSAL | Status: AC
  Administered 2017-06-06: 15 mL via OROMUCOSAL

## 2017-06-06 MED ORDER — FAMOTIDINE IN NACL 20-0.9 MG/50ML-% IV SOLN
20.0000 mg | Freq: Two times a day (BID) | INTRAVENOUS | Status: AC
  Administered 2017-06-06 (×2): 20 mg via INTRAVENOUS
  Filled 2017-06-06: qty 50

## 2017-06-06 MED ORDER — HEMOSTATIC AGENTS (NO CHARGE) OPTIME
TOPICAL | Status: DC | PRN
  Administered 2017-06-06: 1 via TOPICAL

## 2017-06-06 MED ORDER — MORPHINE SULFATE (PF) 4 MG/ML IV SOLN: 2.0000 mg | INTRAVENOUS | Status: DC | PRN

## 2017-06-06 MED ORDER — PROTAMINE SULFATE 10 MG/ML IV SOLN
INTRAVENOUS | Status: DC | PRN
  Administered 2017-06-06: 200 mg via INTRAVENOUS

## 2017-06-06 MED ORDER — ALBUMIN HUMAN 5 % IV SOLN
250.0000 mL | INTRAVENOUS | Status: AC | PRN
  Administered 2017-06-06 – 2017-06-07 (×3): 250 mL via INTRAVENOUS
  Filled 2017-06-06 (×2): qty 250

## 2017-06-06 MED ORDER — MORPHINE SULFATE (PF) 4 MG/ML IV SOLN
1.0000 mg | INTRAVENOUS | Status: DC | PRN
  Administered 2017-06-06: 2 mg via INTRAVENOUS
  Filled 2017-06-06: qty 1

## 2017-06-06 MED ORDER — MILRINONE LACTATE IN DEXTROSE 20-5 MG/100ML-% IV SOLN
0.1250 ug/kg/min | INTRAVENOUS | Status: AC
  Administered 2017-06-06: 0.125 ug/kg/min via INTRAVENOUS
  Filled 2017-06-06: qty 100

## 2017-06-06 MED ORDER — MAGNESIUM SULFATE 4 GM/100ML IV SOLN
4.0000 g | Freq: Once | INTRAVENOUS | Status: AC
  Administered 2017-06-06: 4 g via INTRAVENOUS

## 2017-06-06 MED ORDER — SODIUM CHLORIDE 0.9 % IV SOLN
INTRAVENOUS | Status: DC
  Administered 2017-06-07: 18:00:00 via INTRAVENOUS

## 2017-06-06 MED ORDER — BISACODYL 5 MG PO TBEC
10.0000 mg | DELAYED_RELEASE_TABLET | Freq: Every day | ORAL | Status: DC
  Administered 2017-06-07 – 2017-06-11 (×4): 10 mg via ORAL
  Filled 2017-06-06 (×6): qty 2

## 2017-06-06 MED ORDER — FENTANYL CITRATE (PF) 250 MCG/5ML IJ SOLN
INTRAMUSCULAR | Status: AC
  Filled 2017-06-06: qty 30

## 2017-06-06 MED ORDER — PROPOFOL 10 MG/ML IV BOLUS
INTRAVENOUS | Status: AC
  Filled 2017-06-06: qty 20

## 2017-06-06 MED ORDER — INSULIN REGULAR HUMAN 100 UNIT/ML IJ SOLN
INTRAMUSCULAR | Status: DC
  Filled 2017-06-06: qty 1

## 2017-06-06 MED ORDER — PROPOFOL 10 MG/ML IV BOLUS
INTRAVENOUS | Status: DC | PRN
  Administered 2017-06-06: 40 mg via INTRAVENOUS

## 2017-06-06 MED ORDER — METOCLOPRAMIDE HCL 5 MG/ML IJ SOLN
10.0000 mg | Freq: Four times a day (QID) | INTRAMUSCULAR | Status: DC
  Administered 2017-06-06 – 2017-06-07 (×3): 10 mg via INTRAVENOUS
  Filled 2017-06-06 (×2): qty 2

## 2017-06-06 MED ORDER — ALBUTEROL SULFATE (2.5 MG/3ML) 0.083% IN NEBU
2.5000 mg | INHALATION_SOLUTION | RESPIRATORY_TRACT | Status: DC
  Administered 2017-06-06 (×3): 2.5 mg via RESPIRATORY_TRACT
  Filled 2017-06-06 (×3): qty 3

## 2017-06-06 MED ORDER — LACTATED RINGERS IV SOLN: 500.0000 mL | Freq: Once | INTRAVENOUS | Status: DC | PRN

## 2017-06-06 MED ORDER — VANCOMYCIN HCL IN DEXTROSE 1-5 GM/200ML-% IV SOLN
1000.0000 mg | Freq: Once | INTRAVENOUS | Status: AC
  Administered 2017-06-06: 1000 mg via INTRAVENOUS
  Filled 2017-06-06: qty 200

## 2017-06-06 MED ORDER — LEVOFLOXACIN IN D5W 750 MG/150ML IV SOLN
750.0000 mg | INTRAVENOUS | Status: AC
  Administered 2017-06-07: 750 mg via INTRAVENOUS
  Filled 2017-06-06: qty 150

## 2017-06-06 MED ORDER — ALBUMIN HUMAN 5 % IV SOLN
INTRAVENOUS | Status: DC | PRN
  Administered 2017-06-06 (×3): via INTRAVENOUS

## 2017-06-06 MED ORDER — ACETAMINOPHEN 160 MG/5ML PO SOLN
650.0000 mg | Freq: Once | ORAL | Status: AC
  Administered 2017-06-06: 650 mg

## 2017-06-06 MED ORDER — INSULIN ASPART 100 UNIT/ML ~~LOC~~ SOLN
0.0000 [IU] | SUBCUTANEOUS | Status: DC
  Administered 2017-06-06 – 2017-06-07 (×2): 2 [IU] via SUBCUTANEOUS

## 2017-06-06 MED ORDER — MIDAZOLAM HCL 5 MG/5ML IJ SOLN
INTRAMUSCULAR | Status: DC | PRN
  Administered 2017-06-06: 4 mg via INTRAVENOUS
  Administered 2017-06-06: 3 mg via INTRAVENOUS
  Administered 2017-06-06: 1 mg via INTRAVENOUS

## 2017-06-06 MED ORDER — POTASSIUM CHLORIDE 10 MEQ/50ML IV SOLN
10.0000 meq | Freq: Once | INTRAVENOUS | Status: AC
  Administered 2017-06-06: 10 meq via INTRAVENOUS

## 2017-06-06 MED ORDER — ROCURONIUM BROMIDE 50 MG/5ML IV SOLN
INTRAVENOUS | Status: AC
  Filled 2017-06-06: qty 2

## 2017-06-06 MED ORDER — ARTIFICIAL TEARS OPHTHALMIC OINT
TOPICAL_OINTMENT | OPHTHALMIC | Status: AC
  Filled 2017-06-06: qty 3.5

## 2017-06-06 MED ORDER — ACETAMINOPHEN 650 MG RE SUPP: 650.0000 mg | Freq: Once | RECTAL | Status: AC

## 2017-06-06 MED ORDER — SODIUM CHLORIDE 0.9 % IJ SOLN
OROMUCOSAL | Status: DC | PRN
  Administered 2017-06-06: 4 mL via TOPICAL

## 2017-06-06 MED ORDER — IPRATROPIUM-ALBUTEROL 20-100 MCG/ACT IN AERS
INHALATION_SPRAY | RESPIRATORY_TRACT | Status: DC | PRN
  Administered 2017-06-06: 6 via RESPIRATORY_TRACT

## 2017-06-06 MED ORDER — MILRINONE LACTATE IN DEXTROSE 20-5 MG/100ML-% IV SOLN: 0.3000 ug/kg/min | INTRAVENOUS | Status: DC

## 2017-06-06 MED ORDER — GELATIN ABSORBABLE MT POWD
OROMUCOSAL | Status: DC | PRN
  Administered 2017-06-06: 4 mL via TOPICAL

## 2017-06-06 MED ORDER — DIPHENHYDRAMINE HCL 50 MG/ML IJ SOLN
INTRAMUSCULAR | Status: AC
  Filled 2017-06-06: qty 1

## 2017-06-06 MED ORDER — DIPHENHYDRAMINE HCL 50 MG/ML IJ SOLN
INTRAMUSCULAR | Status: DC | PRN
  Administered 2017-06-06: 12.5 mg via INTRAVENOUS

## 2017-06-06 MED ORDER — INSULIN REGULAR BOLUS VIA INFUSION
0.0000 [IU] | Freq: Three times a day (TID) | INTRAVENOUS | Status: DC
  Filled 2017-06-06: qty 10

## 2017-06-06 MED ORDER — HEPARIN SODIUM (PORCINE) 1000 UNIT/ML IJ SOLN
INTRAMUSCULAR | Status: AC
  Filled 2017-06-06: qty 1

## 2017-06-06 MED ORDER — ASPIRIN 81 MG PO CHEW: 324.0000 mg | CHEWABLE_TABLET | Freq: Every day | ORAL | Status: DC

## 2017-06-06 MED ORDER — ASPIRIN EC 325 MG PO TBEC
325.0000 mg | DELAYED_RELEASE_TABLET | Freq: Every day | ORAL | Status: DC
  Administered 2017-06-07: 325 mg via ORAL
  Filled 2017-06-06: qty 1

## 2017-06-06 MED ORDER — SODIUM CHLORIDE 0.9 % IV SOLN
0.0000 ug/kg/h | INTRAVENOUS | Status: DC
  Filled 2017-06-06: qty 2

## 2017-06-06 MED ORDER — POTASSIUM CHLORIDE 10 MEQ/50ML IV SOLN
10.0000 meq | INTRAVENOUS | Status: AC
  Administered 2017-06-06 (×3): 10 meq via INTRAVENOUS
  Filled 2017-06-06: qty 50

## 2017-06-06 MED ORDER — PROTAMINE SULFATE 10 MG/ML IV SOLN
INTRAVENOUS | Status: AC
  Filled 2017-06-06: qty 25

## 2017-06-06 MED ORDER — ROCURONIUM BROMIDE 10 MG/ML (PF) SYRINGE: PREFILLED_SYRINGE | INTRAVENOUS | Status: DC | PRN

## 2017-06-06 MED ORDER — FENTANYL CITRATE (PF) 250 MCG/5ML IJ SOLN
INTRAMUSCULAR | Status: DC | PRN
  Administered 2017-06-06 (×2): 250 ug via INTRAVENOUS
  Administered 2017-06-06: 50 ug via INTRAVENOUS
  Administered 2017-06-06 (×2): 250 ug via INTRAVENOUS
  Administered 2017-06-06: 200 ug via INTRAVENOUS
  Administered 2017-06-06: 250 ug via INTRAVENOUS

## 2017-06-06 MED ORDER — SODIUM CHLORIDE 0.9 % IV SOLN: Freq: Once | INTRAVENOUS | Status: DC

## 2017-06-06 MED ORDER — METOPROLOL TARTRATE 25 MG/10 ML ORAL SUSPENSION: 12.5000 mg | Freq: Two times a day (BID) | ORAL | Status: DC

## 2017-06-06 MED ORDER — MIDAZOLAM HCL 2 MG/2ML IJ SOLN: 2.0000 mg | INTRAMUSCULAR | Status: DC | PRN

## 2017-06-06 MED ORDER — OXYCODONE HCL 5 MG PO TABS
5.0000 mg | ORAL_TABLET | ORAL | Status: DC | PRN
  Administered 2017-06-07: 10 mg via ORAL
  Administered 2017-06-07: 5 mg via ORAL
  Administered 2017-06-08: 10 mg via ORAL
  Administered 2017-06-09: 5 mg via ORAL
  Administered 2017-06-11 (×2): 10 mg via ORAL
  Filled 2017-06-06 (×3): qty 2
  Filled 2017-06-06 (×2): qty 1
  Filled 2017-06-06: qty 2

## 2017-06-06 MED ORDER — BISACODYL 10 MG RE SUPP: 10.0000 mg | Freq: Every day | RECTAL | Status: DC

## 2017-06-06 MED ORDER — HEPARIN SODIUM (PORCINE) 1000 UNIT/ML IJ SOLN
INTRAMUSCULAR | Status: DC | PRN
  Administered 2017-06-06: 2000 [IU] via INTRAVENOUS
  Administered 2017-06-06: 18000 [IU] via INTRAVENOUS

## 2017-06-06 MED ORDER — 0.9 % SODIUM CHLORIDE (POUR BTL) OPTIME
TOPICAL | Status: DC | PRN
  Administered 2017-06-06: 5000 mL

## 2017-06-06 MED ORDER — SUCCINYLCHOLINE CHLORIDE 200 MG/10ML IV SOSY
PREFILLED_SYRINGE | INTRAVENOUS | Status: AC
  Filled 2017-06-06: qty 10

## 2017-06-06 MED ORDER — MORPHINE SULFATE (PF) 2 MG/ML IV SOLN: 1.0000 mg | INTRAVENOUS | Status: DC | PRN

## 2017-06-06 MED ORDER — SODIUM CHLORIDE 0.45 % IV SOLN
INTRAVENOUS | Status: DC | PRN
  Administered 2017-06-06: 14:00:00 via INTRAVENOUS

## 2017-06-06 MED ORDER — ONDANSETRON HCL 4 MG/2ML IJ SOLN
4.0000 mg | Freq: Four times a day (QID) | INTRAMUSCULAR | Status: DC | PRN
  Administered 2017-06-07: 4 mg via INTRAVENOUS
  Filled 2017-06-06: qty 2

## 2017-06-06 MED ORDER — LACTATED RINGERS IV SOLN
INTRAVENOUS | Status: DC | PRN
  Administered 2017-06-06: 07:00:00 via INTRAVENOUS

## 2017-06-06 MED ORDER — SODIUM CHLORIDE 0.9 % IV SOLN
0.0000 ug/min | INTRAVENOUS | Status: DC
  Administered 2017-06-06: 60 ug/min via INTRAVENOUS
  Administered 2017-06-07: 30 ug/min via INTRAVENOUS
  Administered 2017-06-07 (×2): 50 ug/min via INTRAVENOUS
  Filled 2017-06-06 (×5): qty 2

## 2017-06-06 MED ORDER — EPHEDRINE 5 MG/ML INJ
INTRAVENOUS | Status: AC
  Filled 2017-06-06: qty 10

## 2017-06-06 MED ORDER — PHENYLEPHRINE HCL 10 MG/ML IJ SOLN
INTRAMUSCULAR | Status: DC | PRN
  Administered 2017-06-06: 20 ug via INTRAVENOUS
  Administered 2017-06-06 (×2): 40 ug via INTRAVENOUS

## 2017-06-06 MED ORDER — SODIUM CHLORIDE 0.9 % IV SOLN: 250.0000 mL | INTRAVENOUS | Status: DC

## 2017-06-06 MED ORDER — ACETAMINOPHEN 160 MG/5ML PO SOLN
1000.0000 mg | Freq: Four times a day (QID) | ORAL | Status: AC
  Administered 2017-06-06: 1000 mg
  Filled 2017-06-06: qty 40.6

## 2017-06-06 MED ORDER — PROTAMINE SULFATE 10 MG/ML IV SOLN
25.0000 mg | Freq: Once | INTRAVENOUS | Status: AC
  Administered 2017-06-06: 25 mg via INTRAVENOUS

## 2017-06-06 MED ORDER — TRAMADOL HCL 50 MG PO TABS: 50.0000 mg | ORAL_TABLET | ORAL | Status: DC | PRN

## 2017-06-06 MED ORDER — PANTOPRAZOLE SODIUM 40 MG PO TBEC
40.0000 mg | DELAYED_RELEASE_TABLET | Freq: Every day | ORAL | Status: DC
  Administered 2017-06-08 – 2017-06-14 (×7): 40 mg via ORAL
  Filled 2017-06-06 (×7): qty 1

## 2017-06-06 MED ORDER — ARTIFICIAL TEARS OPHTHALMIC OINT
TOPICAL_OINTMENT | OPHTHALMIC | Status: DC | PRN
  Administered 2017-06-06: 1 via OPHTHALMIC

## 2017-06-06 MED ORDER — DOCUSATE SODIUM 100 MG PO CAPS
200.0000 mg | ORAL_CAPSULE | Freq: Every day | ORAL | Status: DC
  Administered 2017-06-07 – 2017-06-12 (×6): 200 mg via ORAL
  Filled 2017-06-06 (×6): qty 2

## 2017-06-06 MED ORDER — NITROGLYCERIN IN D5W 200-5 MCG/ML-% IV SOLN: 0.0000 ug/min | INTRAVENOUS | Status: DC

## 2017-06-06 MED ORDER — MORPHINE SULFATE (PF) 2 MG/ML IV SOLN: 2.0000 mg | INTRAVENOUS | Status: DC | PRN

## 2017-06-06 MED ORDER — SODIUM CHLORIDE 0.9 % IJ SOLN
INTRAMUSCULAR | Status: DC | PRN
  Administered 2017-06-06: 4 mL via TOPICAL

## 2017-06-06 MED ORDER — METOPROLOL TARTRATE 5 MG/5ML IV SOLN: 2.5000 mg | INTRAVENOUS | Status: DC | PRN

## 2017-06-06 MED ORDER — PHENYLEPHRINE 40 MCG/ML (10ML) SYRINGE FOR IV PUSH (FOR BLOOD PRESSURE SUPPORT)
PREFILLED_SYRINGE | INTRAVENOUS | Status: AC
  Filled 2017-06-06: qty 10

## 2017-06-06 MED FILL — Sodium Chloride IV Soln 0.9%: INTRAVENOUS | Qty: 2000 | Status: AC

## 2017-06-06 MED FILL — Heparin Sodium (Porcine) Inj 1000 Unit/ML: INTRAMUSCULAR | Qty: 20 | Status: AC

## 2017-06-06 MED FILL — Lidocaine HCl IV Inj 20 MG/ML: INTRAVENOUS | Qty: 5 | Status: AC

## 2017-06-06 MED FILL — Electrolyte-R (PH 7.4) Solution: INTRAVENOUS | Qty: 3000 | Status: AC

## 2017-06-06 MED FILL — Mannitol IV Soln 20%: INTRAVENOUS | Qty: 500 | Status: AC

## 2017-06-06 MED FILL — Sodium Bicarbonate IV Soln 8.4%: INTRAVENOUS | Qty: 50 | Status: AC

## 2017-06-06 SURGICAL SUPPLY — 109 items
ADH SKN CLS APL DERMABOND .7 (GAUZE/BANDAGES/DRESSINGS) ×2
BAG DECANTER FOR FLEXI CONT (MISCELLANEOUS) ×4 IMPLANT
BANDAGE ACE 4X5 VEL STRL LF (GAUZE/BANDAGES/DRESSINGS) ×4 IMPLANT
BANDAGE ACE 6X5 VEL STRL LF (GAUZE/BANDAGES/DRESSINGS) ×4 IMPLANT
BASKET HEART  (ORDER IN 25'S) (MISCELLANEOUS) ×1
BASKET HEART (ORDER IN 25'S) (MISCELLANEOUS) ×1
BASKET HEART (ORDER IN 25S) (MISCELLANEOUS) ×2 IMPLANT
BLADE STERNUM SYSTEM 6 (BLADE) ×4 IMPLANT
BLADE SURG 11 STRL SS (BLADE) ×2 IMPLANT
BNDG GAUZE ELAST 4 BULKY (GAUZE/BANDAGES/DRESSINGS) ×4 IMPLANT
CANISTER SUCT 3000ML PPV (MISCELLANEOUS) ×4 IMPLANT
CANNULA EZ GLIDE AORTIC 21FR (CANNULA) ×4 IMPLANT
CATH CPB KIT HENDRICKSON (MISCELLANEOUS) ×4 IMPLANT
CATH ROBINSON RED A/P 18FR (CATHETERS) ×4 IMPLANT
CATH THORACIC 36FR (CATHETERS) ×4 IMPLANT
CATH THORACIC 36FR RT ANG (CATHETERS) ×4 IMPLANT
CLIP VESOCCLUDE MED 24/CT (CLIP) IMPLANT
CLIP VESOCCLUDE SM WIDE 24/CT (CLIP) ×4 IMPLANT
CRADLE DONUT ADULT HEAD (MISCELLANEOUS) ×4 IMPLANT
DERMABOND ADVANCED (GAUZE/BANDAGES/DRESSINGS) ×2
DERMABOND ADVANCED .7 DNX12 (GAUZE/BANDAGES/DRESSINGS) IMPLANT
DRAPE CARDIOVASCULAR INCISE (DRAPES) ×4
DRAPE SLUSH/WARMER DISC (DRAPES) ×4 IMPLANT
DRAPE SRG 135X102X78XABS (DRAPES) ×2 IMPLANT
DRSG COVADERM 4X14 (GAUZE/BANDAGES/DRESSINGS) ×4 IMPLANT
ELECT REM PT RETURN 9FT ADLT (ELECTROSURGICAL) ×8
ELECTRODE REM PT RTRN 9FT ADLT (ELECTROSURGICAL) ×4 IMPLANT
FELT TEFLON 1X6 (MISCELLANEOUS) ×8 IMPLANT
GAUZE SPONGE 4X4 12PLY STRL (GAUZE/BANDAGES/DRESSINGS) ×8 IMPLANT
GAUZE SPONGE 4X4 12PLY STRL LF (GAUZE/BANDAGES/DRESSINGS) ×4 IMPLANT
GLOVE BIOGEL M 6.5 STRL (GLOVE) ×8 IMPLANT
GLOVE BIOGEL M 7.0 STRL (GLOVE) ×8 IMPLANT
GLOVE BIOGEL PI IND STRL 6.5 (GLOVE) IMPLANT
GLOVE BIOGEL PI IND STRL 7.0 (GLOVE) IMPLANT
GLOVE BIOGEL PI INDICATOR 6.5 (GLOVE) ×8
GLOVE BIOGEL PI INDICATOR 7.0 (GLOVE) ×2
GLOVE SURG SIGNA 7.5 PF LTX (GLOVE) ×12 IMPLANT
GOWN STRL REUS W/ TWL LRG LVL3 (GOWN DISPOSABLE) ×8 IMPLANT
GOWN STRL REUS W/ TWL XL LVL3 (GOWN DISPOSABLE) ×4 IMPLANT
GOWN STRL REUS W/TWL LRG LVL3 (GOWN DISPOSABLE) ×40
GOWN STRL REUS W/TWL XL LVL3 (GOWN DISPOSABLE) ×8
HEMOSTAT POWDER SURGIFOAM 1G (HEMOSTASIS) ×12 IMPLANT
HEMOSTAT SURGICEL 2X14 (HEMOSTASIS) ×4 IMPLANT
INSERT FOGARTY XLG (MISCELLANEOUS) IMPLANT
KIT BASIN OR (CUSTOM PROCEDURE TRAY) ×4 IMPLANT
KIT ROOM TURNOVER OR (KITS) ×4 IMPLANT
KIT SUCTION CATH 14FR (SUCTIONS) ×8 IMPLANT
KIT VASOVIEW HEMOPRO VH 3000 (KITS) ×4 IMPLANT
MARKER GRAFT CORONARY BYPASS (MISCELLANEOUS) ×12 IMPLANT
NS IRRIG 1000ML POUR BTL (IV SOLUTION) ×20 IMPLANT
PACK OPEN HEART (CUSTOM PROCEDURE TRAY) ×4 IMPLANT
PAD ARMBOARD 7.5X6 YLW CONV (MISCELLANEOUS) ×8 IMPLANT
PAD ELECT DEFIB RADIOL ZOLL (MISCELLANEOUS) ×4 IMPLANT
PENCIL BUTTON HOLSTER BLD 10FT (ELECTRODE) ×4 IMPLANT
PUNCH AORTIC ROTATE  4.5MM 8IN (MISCELLANEOUS) ×2 IMPLANT
PUNCH AORTIC ROTATE 4.0MM (MISCELLANEOUS) IMPLANT
PUNCH AORTIC ROTATE 4.5MM 8IN (MISCELLANEOUS) IMPLANT
PUNCH AORTIC ROTATE 5MM 8IN (MISCELLANEOUS) IMPLANT
SET CARDIOPLEGIA MPS 5001102 (MISCELLANEOUS) ×2 IMPLANT
SOLUTION ANTI FOG 6CC (MISCELLANEOUS) ×2 IMPLANT
SPONGE LAP 18X18 X RAY DECT (DISPOSABLE) ×2 IMPLANT
SUT BONE WAX W31G (SUTURE) ×4 IMPLANT
SUT ETHIBOND 2 0 SH (SUTURE) ×16
SUT ETHIBOND 2 0 SH 36X2 (SUTURE) IMPLANT
SUT MNCRL AB 4-0 PS2 18 (SUTURE) ×2 IMPLANT
SUT PROLENE 3 0 SH DA (SUTURE) ×4 IMPLANT
SUT PROLENE 4 0 RB 1 (SUTURE) ×4
SUT PROLENE 4 0 SH DA (SUTURE) IMPLANT
SUT PROLENE 4-0 RB1 .5 CRCL 36 (SUTURE) IMPLANT
SUT PROLENE 5 0 C 1 36 (SUTURE) ×6 IMPLANT
SUT PROLENE 6 0 C 1 30 (SUTURE) ×12 IMPLANT
SUT PROLENE 7 0 BV 1 (SUTURE) ×4 IMPLANT
SUT PROLENE 7 0 BV1 MDA (SUTURE) ×6 IMPLANT
SUT PROLENE 8 0 BV175 6 (SUTURE) ×4 IMPLANT
SUT SILK  1 MH (SUTURE) ×6
SUT SILK 1 MH (SUTURE) IMPLANT
SUT SILK 1 TIES 10X30 (SUTURE) ×2 IMPLANT
SUT SILK 2 0 SH CR/8 (SUTURE) ×4 IMPLANT
SUT SILK 2 0 TIES 10X30 (SUTURE) ×2 IMPLANT
SUT SILK 2 0 TIES 17X18 (SUTURE) ×4
SUT SILK 2-0 18XBRD TIE BLK (SUTURE) IMPLANT
SUT SILK 3 0 SH CR/8 (SUTURE) ×2 IMPLANT
SUT SILK 4 0 TIE 10X30 (SUTURE) ×4 IMPLANT
SUT STEEL 6MS V (SUTURE) ×4 IMPLANT
SUT STEEL STERNAL CCS#1 18IN (SUTURE) IMPLANT
SUT STEEL SZ 6 DBL 3X14 BALL (SUTURE) ×4 IMPLANT
SUT TEM PAC WIRE 2 0 SH (SUTURE) ×4 IMPLANT
SUT VIC AB 1 CTX 36 (SUTURE) ×12
SUT VIC AB 1 CTX36XBRD ANBCTR (SUTURE) ×4 IMPLANT
SUT VIC AB 2-0 CT1 27 (SUTURE) ×4
SUT VIC AB 2-0 CT1 TAPERPNT 27 (SUTURE) IMPLANT
SUT VIC AB 2-0 CTX 27 (SUTURE) ×4 IMPLANT
SUT VIC AB 3-0 SH 27 (SUTURE)
SUT VIC AB 3-0 SH 27X BRD (SUTURE) IMPLANT
SUT VIC AB 3-0 X1 27 (SUTURE) ×4 IMPLANT
SUT VICRYL 4-0 PS2 18IN ABS (SUTURE) IMPLANT
SUTURE E-PAK OPEN HEART (SUTURE) ×2 IMPLANT
SYSTEM SAHARA CHEST DRAIN ATS (WOUND CARE) ×4 IMPLANT
TAPE CLOTH SURG 4X10 WHT LF (GAUZE/BANDAGES/DRESSINGS) ×2 IMPLANT
TAPE PAPER 2X10 WHT MICROPORE (GAUZE/BANDAGES/DRESSINGS) ×2 IMPLANT
TOWEL GREEN STERILE (TOWEL DISPOSABLE) ×16 IMPLANT
TOWEL GREEN STERILE FF (TOWEL DISPOSABLE) ×8 IMPLANT
TOWEL OR 17X24 6PK STRL BLUE (TOWEL DISPOSABLE) ×8 IMPLANT
TOWEL OR 17X26 10 PK STRL BLUE (TOWEL DISPOSABLE) ×8 IMPLANT
TRAY FOLEY SILVER 16FR TEMP (SET/KITS/TRAYS/PACK) ×4 IMPLANT
TUBE FEEDING 8FR 16IN STR KANG (MISCELLANEOUS) ×4 IMPLANT
TUBING INSUFFLATION (TUBING) ×4 IMPLANT
UNDERPAD 30X30 (UNDERPADS AND DIAPERS) ×4 IMPLANT
WATER STERILE IRR 1000ML POUR (IV SOLUTION) ×8 IMPLANT

## 2017-06-06 NOTE — Anesthesia Procedure Notes (Signed)
Central Venous Catheter Insertion Performed by: Eilene GhaziOSE, Marelly Wehrman, anesthesiologist Start/End10/17/2018 7:45 AM, 06/06/2017 8:03 AM Patient location: Pre-op. Preanesthetic checklist: patient identified, IV checked, site marked, risks and benefits discussed, surgical consent, monitors and equipment checked, pre-op evaluation, timeout performed and anesthesia consent Position: Trendelenburg Lidocaine 1% used for infiltration and patient sedated Hand hygiene performed , maximum sterile barriers used  and Seldinger technique used Catheter size: 8.5 Fr Total catheter length 10. PA cath was placed.Sheath introducer Swan type:thermodilution PA Cath depth:42 Procedure performed using ultrasound guided technique. Ultrasound Notes:anatomy identified, needle tip was noted to be adjacent to the nerve/plexus identified, no ultrasound evidence of intravascular and/or intraneural injection and image(s) printed for medical record Attempts: 1 Following insertion, line sutured and dressing applied. Post procedure assessment: blood return through all ports, free fluid flow and no air  Patient tolerated the procedure well with no immediate complications.

## 2017-06-06 NOTE — Brief Op Note (Addendum)
05/28/2017 - 06/06/2017  8:36 AM  PATIENT:  Dominic PeaKathy Gervin  66 y.o. female  PRE-OPERATIVE DIAGNOSIS:  CAD  POST-OPERATIVE DIAGNOSIS:  CAD  PROCEDURE:  Procedure(s):  CORONARY ARTERY BYPASS GRAFTING x 4 -LIMA to LAD -SVG to PDA -SEQ SVG to RAMUS 1 and RAMUS 2  ENDOSCOPIC HARVEST GREATER SAPHENOUS VEIN -Right Leg  TRANSESOPHAGEAL ECHOCARDIOGRAM (TEE) (N/A)  SURGEON:  Surgeon(s) and Role:    * Loreli SlotHendrickson, Princes Finger C, MD - Primary  PHYSICIAN ASSISTANT: Lowella DandyErin Barrett PA-C  ASSISTANTS: Lennox SoldersMichelle Gingler RNFA  ANESTHESIA:   general  EBL:  650 mL   BLOOD ADMINISTERED: CELLSAVER  DRAINS: Left Pleural Chest Tube, Mediastinal Chest Drains   LOCAL MEDICATIONS USED:  NONE  SPECIMEN:  No Specimen  DISPOSITION OF SPECIMEN:  N/A  COUNTS:  YES  TOURNIQUET:  * No tourniquets in log *  DICTATION: .Dragon Dictation  PLAN OF CARE: Admit to inpatient   PATIENT DISPOSITION:  ICU - intubated and hemodynamically stable.   Delay start of Pharmacological VTE agent (>24hrs) due to surgical blood loss or risk of bleeding: yes

## 2017-06-06 NOTE — Progress Notes (Signed)
Patient ID: Samantha PeaKathy Olesky, female   DOB: [birth year], 66 y.o.   MRN: [id number] EVENING ROUNDS NOTE :     [address]       [place] [id number]             [phone]                 Day of Surgery Procedure(s) (LRB): CORONARY ARTERY BYPASS GRAFTING x 4 USING LEFT INTERNAL MAMMARY ARTERY AND RIGHT GREATER SAPHENOUS VEIN HARVESTED ENDOSCOPICALLY -LIMA to LAD -SVG to PDA -SEQ SVG to RAMUS 1 and RAMUS 2 (N/A) TRANSESOPHAGEAL ECHOCARDIOGRAM (TEE) (N/A)  Total Length of Stay:  LOS: 8 days  BP (!) 103/50   Pulse 89   Temp 97.7 F (36.5 C)   Resp 14   Ht 5\' 4"  (1.626 m)   Wt 134 lb 6.4 oz (61 kg) Comment: scale b  SpO2 100%   BMI 23.07 kg/m   .Intake/Output      10/17 0701 - 10/18 0700   I.V. (mL/kg) 492.3 (8.1)   Blood 201   IV Piggyback 1150   Total Intake(mL/kg) 1843.3 (30.2)   Urine (mL/kg/hr) 910 (1.2)   Blood 650   Chest Tube 610   Total Output 2170   Net -326.7         . sodium chloride 20 mL/hr at 06/06/17 1900  . [START ON 06/07/2017] sodium chloride    . sodium chloride 20 mL/hr at 06/06/17 1900  . sodium chloride    . albumin human    . dexmedetomidine (PRECEDEX) IV infusion Stopped (06/06/17 1730)  . famotidine (PEPCID) IV Stopped (06/06/17 1418)  . insulin (NOVOLIN-R) infusion Stopped (06/06/17 1330)  . lactated ringers    . lactated ringers    . lactated ringers    . [START ON 06/07/2017] levofloxacin (LEVAQUIN) IV    . milrinone 0.125 mcg/kg/min (06/06/17 1900)  . nitroGLYCERIN Stopped (06/06/17 1330)  . phenylephrine (NEO-SYNEPHRINE) Adult infusion 60 mcg/min (06/06/17 1900)  . vancomycin       Lab Results  Component Value Date   WBC 18.1 (H) 06/06/2017   HGB 6.6 (LL) 06/06/2017   HCT 19.3 (L) 06/06/2017   PLT 148 (L) 06/06/2017   GLUCOSE 108 (H) 06/06/2017   CHOL 196 05/31/2017   TRIG 104 06/01/2017   HDL 63 05/31/2017   LDLCALC 116 (H) 05/31/2017   ALT 13 (L) 05/29/2017   AST 26 05/29/2017   NA 132 (L) 06/06/2017   K  4.7 06/06/2017   CL 92 (L) 06/06/2017   CREATININE 0.69 06/06/2017   BUN 12 06/06/2017   CO2 34 (H) 06/06/2017   INR 1.57 06/06/2017   Failed attempt at weaning vent , pco2 up to 65 Some increased ct output protamine and blood given, total 700 ml out chest tube Back on full vent support H/h down to 6.6 , have ordered second unit rbc  Delight OvensEdward B Christena Sunderlin MD  Beeper [phone] Office [phone] 06/06/2017 7:39 PM

## 2017-06-06 NOTE — Anesthesia Procedure Notes (Signed)
Arterial Line Insertion Start/End10/17/2018 7:45 AM, 06/06/2017 8:00 AM Performed by: Adair LaundryPAXTON, LYNN A, CRNA  Patient location: Pre-op. Preanesthetic checklist: patient identified, risks and benefits discussed and monitors and equipment checked Left, radial was placed Hand hygiene performed  and maximum sterile barriers used   Attempts: 2 Procedure performed without using ultrasound guided technique. Following insertion, dressing applied and Biopatch. Post procedure assessment: unchanged (Short perio of vaso spasm recovering within 1 minute.  )  Patient tolerated the procedure well with no immediate complications.

## 2017-06-06 NOTE — Progress Notes (Signed)
Dr. Dorris FetchHendrickson aware of chest tube drainage 450 ml since admission to CVICU. Will continue to monitor.

## 2017-06-06 NOTE — Anesthesia Procedure Notes (Signed)
Anesthesia Procedure Image    

## 2017-06-06 NOTE — Anesthesia Procedure Notes (Signed)
Central Venous Catheter Insertion Performed by: Eilene GhaziOSE, GEORGE, anesthesiologist Patient location: Pre-op. Preanesthetic checklist: patient identified, IV checked, site marked, risks and benefits discussed, surgical consent, monitors and equipment checked, pre-op evaluation, timeout performed and anesthesia consent Hand hygiene performed  and maximum sterile barriers used  PA cath was placed.Swan type:thermodilution Procedure performed without using ultrasound guided technique. Attempts: 1 Patient tolerated the procedure well with no immediate complications.

## 2017-06-06 NOTE — Anesthesia Procedure Notes (Addendum)
Procedure Name: Intubation Date/Time: 06/06/2017 8:45 AM Performed by: Coralee RudFLORES, Tovah Slavick Pre-anesthesia Checklist: Patient identified, Emergency Drugs available, Suction available and Patient being monitored Patient Re-evaluated:Patient Re-evaluated prior to induction Oxygen Delivery Method: Circle system utilized Preoxygenation: Pre-oxygenation with 100% oxygen Induction Type: IV induction Ventilation: Mask ventilation without difficulty Laryngoscope Size: Miller and 3 Grade View: Grade I Tube type: Oral Tube size: 7.5 mm Number of attempts: 1 Airway Equipment and Method: Stylet Placement Confirmation: ETT inserted through vocal cords under direct vision,  positive ETCO2 and breath sounds checked- equal and bilateral Secured at: 22 cm Tube secured with: Tape Dental Injury: Teeth and Oropharynx as per pre-operative assessment

## 2017-06-06 NOTE — Anesthesia Postprocedure Evaluation (Signed)
Anesthesia Post Note  Patient: Samantha Dyer  Procedure(s) Performed: CORONARY ARTERY BYPASS GRAFTING x 4 USING LEFT INTERNAL MAMMARY ARTERY AND RIGHT GREATER SAPHENOUS VEIN HARVESTED ENDOSCOPICALLY -LIMA to LAD -SVG to PDA -SEQ SVG to RAMUS 1 and RAMUS 2 (N/A Chest) TRANSESOPHAGEAL ECHOCARDIOGRAM (TEE) (N/A )     Patient location during evaluation: SICU Anesthesia Type: General Level of consciousness: sedated and patient remains intubated per anesthesia plan Pain management: pain level controlled Vital Signs Assessment: post-procedure vital signs reviewed and stable Respiratory status: patient remains intubated per anesthesia plan Cardiovascular status: stable Anesthetic complications: no    Last Vitals:  Vitals:   06/06/17 1817 06/06/17 1830  BP:  106/81  Pulse: 90 88  Resp: 17 19  Temp:  (!) 36.3 C  SpO2: 100% 98%    Last Pain:  Vitals:   06/06/17 1830  TempSrc: Core (Comment)  PainSc:                  Lewie LoronJohn Makalyn Lennox

## 2017-06-06 NOTE — Transfer of Care (Signed)
Immediate Anesthesia Transfer of Care Note  Patient: Samantha PeaKathy Leiva  Procedure(s) Performed: CORONARY ARTERY BYPASS GRAFTING x 4 USING LEFT INTERNAL MAMMARY ARTERY AND RIGHT GREATER SAPHENOUS VEIN HARVESTED ENDOSCOPICALLY -LIMA to LAD -SVG to PDA -SEQ SVG to RAMUS 1 and RAMUS 2 (N/A Chest) TRANSESOPHAGEAL ECHOCARDIOGRAM (TEE) (N/A )  Patient Location: PACU  Anesthesia Type:General  Level of Consciousness: Patient remains intubated per anesthesia plan  Airway & Oxygen Therapy: Patient remains intubated per anesthesia plan and Patient placed on Ventilator (see vital sign flow sheet for setting)  Post-op Assessment: Report given to RN, Post -op Vital signs reviewed and stable and Patient moving all extremities  Post vital signs: Reviewed and stable  Last Vitals:  Vitals:   06/06/17 0810 06/06/17 1324  BP: (!) 140/36 (!) 85/36  Pulse: (!) 54 90  Resp: 15 12  Temp:    SpO2: 99% 100%    Last Pain:  Vitals:   06/06/17 0525  TempSrc: Oral  PainSc:          Complications: No apparent anesthesia complications

## 2017-06-06 NOTE — Progress Notes (Signed)
Patients belongings (1 Bag) were taken to PACU, as she had no family here.

## 2017-06-06 NOTE — Interval H&P Note (Signed)
History and Physical Interval Note:  06/06/2017 8:10 AM  Samantha PeaKathy Colston  has presented today for surgery, with the diagnosis of CAD  The various methods of treatment have been discussed with the patient and family. After consideration of risks, benefits and other options for treatment, the patient has consented to  Procedure(s): CORONARY ARTERY BYPASS GRAFTING (CABG) (N/A) TRANSESOPHAGEAL ECHOCARDIOGRAM (TEE) (N/A) as a surgical intervention .  The patient's history has been reviewed, patient examined, no change in status, stable for surgery.  I have reviewed the patient's chart and labs.  Questions were answered to the patient's satisfaction.     Loreli SlotSteven C Durrell Barajas

## 2017-06-06 NOTE — H&P (View-Only) (Signed)
3 Days Post-Op Procedure(s) (LRB): RIGHT/LEFT HEART CATH AND CORONARY ANGIOGRAPHY (N/A) Subjective: She denies chest pain and shortness of breath "I want to go home". Asking if it is OK for her to have a few cigarettes while in hospital  Objective: Vital signs in last 24 hours: Temp:  [97.9 F (36.6 C)-98.6 F (37 C)] 98.6 F (37 C) (10/15 1402) Pulse Rate:  [65-72] 65 (10/15 1402) Cardiac Rhythm: Normal sinus rhythm (10/15 0712) Resp:  [16-18] 18 (10/15 1402) BP: (101-115)/(38-84) 109/38 (10/15 1402) SpO2:  [88 %-99 %] 93 % (10/15 1402) Weight:  [131 lb 8 oz (59.6 kg)] 131 lb 8 oz (59.6 kg) (10/15 0709)  Hemodynamic parameters for last 24 hours:    Intake/Output from previous day: 10/14 0701 - 10/15 0700 In: 1293 [P.O.:960; I.V.:333] Out: 900 [Urine:900] Intake/Output this shift: Total I/O In: 240 [P.O.:240] Out: 700 [Urine:700]  General appearance: alert, cooperative and no distress Neurologic: intact Heart: regular rate and rhythm Lungs: diminished BS no wheezing  Lab Results:  Recent Labs  06/03/17 0434 06/04/17 0604  WBC 5.4 6.2  HGB 10.6* 10.2*  HCT 31.7* 30.5*  PLT 121* 115*   BMET:  Recent Labs  06/03/17 0434 06/04/17 0604  NA 129* 127*  K 3.8 3.7  CL 87* 84*  CO2 37* 35*  GLUCOSE 95 89  BUN 13 15  CREATININE 0.74 0.82  CALCIUM 8.6* 8.7*    PT/INR: No results for input(s): LABPROT, INR in the last 72 hours. ABG    Component Value Date/Time   PHART 7.368 06/01/2017 1241   HCO3 42.8 (H) 06/01/2017 1249   TCO2 45 (H) 06/01/2017 1249   O2SAT 70.0 06/01/2017 1249   CBG (last 3)  No results for input(s): GLUCAP in the last 72 hours.  Assessment/Plan: S/P Procedure(s) (LRB): RIGHT/LEFT HEART CATH AND CORONARY ANGIOGRAPHY (N/A) -  Reviewed PFTs- FEV1= 0.95(40%) and DLCO 12.63 (53%). Reviewed carotid duplex: 40-59% RICA and 60-79% LICA  PFTs are in 40-50% of normal range. She is relatively high risk for pulmonary complications, but risk  is by no means prohibitive.  I informed her of the indications, risks, benefits and alternatives (medical therapy) for CABG. She understands the risks include but are not limited to death, MI, stroke, bleeding, possible need for transfusion, infection, DVT, PE, cardiac arrhythmias, respiratory failure, renal failure as well as other unforeseeable complications. She accepts risks and agrees to proceed.  I have ordered a nicotine patch to help with cravings.  Plan surgery Wed 06/06/2017   LOS: 6 days    Samantha Dyer 06/04/2017

## 2017-06-06 NOTE — Progress Notes (Signed)
  Echocardiogram Echocardiogram Transesophageal has been performed.  Eligh Rybacki L Androw 06/06/2017, 9:08 AM

## 2017-06-07 ENCOUNTER — Encounter (HOSPITAL_COMMUNITY): Payer: Self-pay | Admitting: Thoracic Surgery (Cardiothoracic Vascular Surgery)

## 2017-06-07 ENCOUNTER — Inpatient Hospital Stay (HOSPITAL_COMMUNITY): Payer: Medicare Other

## 2017-06-07 LAB — CBC
HCT: 23.9 % — ABNORMAL LOW (ref 36.0–46.0)
HCT: 19.1 % — ABNORMAL LOW (ref 36.0–46.0)
HEMOGLOBIN: 8.2 g/dL — AB (ref 12.0–15.0)
Hemoglobin: 6.6 g/dL — CL (ref 12.0–15.0)
MCH: 30.7 pg (ref 26.0–34.0)
MCH: 31.3 pg (ref 26.0–34.0)
MCHC: 34.3 g/dL (ref 30.0–36.0)
MCHC: 34.6 g/dL (ref 30.0–36.0)
MCV: 89.5 fL (ref 78.0–100.0)
MCV: 90.5 fL (ref 78.0–100.0)
PLATELETS: 121 10*3/uL — AB (ref 150–400)
PLATELETS: 82 10*3/uL — AB (ref 150–400)
RBC: 2.67 MIL/uL — AB (ref 3.87–5.11)
RBC: 2.11 MIL/uL — AB (ref 3.87–5.11)
RDW: 14.4 % (ref 11.5–15.5)
RDW: 14.8 % (ref 11.5–15.5)
WBC: 14 10*3/uL — ABNORMAL HIGH (ref 4.0–10.5)
WBC: 11.1 10*3/uL — ABNORMAL HIGH (ref 4.0–10.5)

## 2017-06-07 LAB — GLUCOSE, CAPILLARY
GLUCOSE-CAPILLARY: 149 mg/dL — AB (ref 65–99)
Glucose-Capillary: 110 mg/dL — ABNORMAL HIGH (ref 65–99)
Glucose-Capillary: 110 mg/dL — ABNORMAL HIGH (ref 65–99)
Glucose-Capillary: 95 mg/dL (ref 65–99)
Glucose-Capillary: 113 mg/dL — ABNORMAL HIGH (ref 65–99)
Glucose-Capillary: 103 mg/dL — ABNORMAL HIGH (ref 65–99)

## 2017-06-07 LAB — POCT I-STAT 3, ART BLOOD GAS (G3+)
ACID-BASE EXCESS: 2 mmol/L (ref 0.0–2.0)
BICARBONATE: 28.5 mmol/L — AB (ref 20.0–28.0)
O2 SAT: 98 %
PH ART: 7.333 — AB (ref 7.350–7.450)
PO2 ART: 108 mmHg (ref 83.0–108.0)
Patient temperature: 36.8
TCO2: 30 mmol/L (ref 22–32)
pCO2 arterial: 53.7 mmHg — ABNORMAL HIGH (ref 32.0–48.0)

## 2017-06-07 LAB — BASIC METABOLIC PANEL
Anion gap: 6 (ref 5–15)
BUN: 16 mg/dL (ref 6–20)
CHLORIDE: 98 mmol/L — AB (ref 101–111)
CO2: 27 mmol/L (ref 22–32)
CREATININE: 0.82 mg/dL (ref 0.44–1.00)
Calcium: 7.7 mg/dL — ABNORMAL LOW (ref 8.9–10.3)
Glucose, Bld: 107 mg/dL — ABNORMAL HIGH (ref 65–99)
POTASSIUM: 5 mmol/L (ref 3.5–5.1)
SODIUM: 131 mmol/L — AB (ref 135–145)

## 2017-06-07 LAB — MAGNESIUM
MAGNESIUM: 2.2 mg/dL (ref 1.7–2.4)
MAGNESIUM: 2.1 mg/dL (ref 1.7–2.4)

## 2017-06-07 LAB — CREATININE, SERUM
CREATININE: 0.97 mg/dL (ref 0.44–1.00)
GFR calc Af Amer: >60 mL/min (ref >60)
GFR, EST NON AFRICAN AMERICAN: 60 mL/min — AB (ref >60)

## 2017-06-07 LAB — PREPARE RBC (CROSSMATCH)

## 2017-06-07 MED ORDER — FUROSEMIDE 10 MG/ML IJ SOLN
20.0000 mg | Freq: Two times a day (BID) | INTRAMUSCULAR | Status: DC
  Administered 2017-06-07 – 2017-06-10 (×7): 20 mg via INTRAVENOUS
  Filled 2017-06-07 (×7): qty 2

## 2017-06-07 MED ORDER — ORAL CARE MOUTH RINSE: 15.0000 mL | Freq: Two times a day (BID) | OROMUCOSAL | Status: DC

## 2017-06-07 MED ORDER — LIDOCAINE HCL (PF) 1 % IJ SOLN
INTRAMUSCULAR | Status: AC
  Administered 2017-06-07: 17:00:00
  Filled 2017-06-07: qty 5

## 2017-06-07 MED ORDER — ALBUTEROL SULFATE (2.5 MG/3ML) 0.083% IN NEBU
2.5000 mg | INHALATION_SOLUTION | Freq: Three times a day (TID) | RESPIRATORY_TRACT | Status: DC
  Administered 2017-06-07 – 2017-06-11 (×12): 2.5 mg via RESPIRATORY_TRACT
  Filled 2017-06-07 (×13): qty 3

## 2017-06-07 MED ORDER — PREDNISONE 20 MG PO TABS
20.0000 mg | ORAL_TABLET | Freq: Every day | ORAL | Status: DC
  Administered 2017-06-08 – 2017-06-10 (×3): 20 mg via ORAL
  Filled 2017-06-07 (×3): qty 1

## 2017-06-07 MED ORDER — ALBUMIN HUMAN 5 % IV SOLN
12.5000 g | Freq: Once | INTRAVENOUS | Status: AC
  Administered 2017-06-07: 12.5 g via INTRAVENOUS
  Filled 2017-06-07: qty 250

## 2017-06-07 MED ORDER — CHLORHEXIDINE GLUCONATE 0.12 % MT SOLN
15.0000 mL | Freq: Two times a day (BID) | OROMUCOSAL | Status: DC
  Administered 2017-06-07 – 2017-06-08 (×3): 15 mL via OROMUCOSAL
  Filled 2017-06-07 (×2): qty 15

## 2017-06-07 NOTE — Plan of Care (Signed)
Problem: Activity: Goal: Risk for activity intolerance will decrease Outcome: Progressing Pt was up in chair for three hours today and tolerated it well.   Problem: Cardiac: Goal: Will show no signs and symptoms of excessive bleeding Outcome: Progressing Pts chest tube drainage around tubes decreased significantly after additional stitches were placed per Dr. Donata ClayVan Trigt.  Problem: Pain Management: Goal: Pain level will decrease Outcome: Progressing Pts pain is controlled well with prescribed pain medication.

## 2017-06-07 NOTE — Progress Notes (Signed)
Pts pre-extubation ABG was as follows:  PH: 7.333 CO2: 53.7 PO2:108 HCO3: 28.5 O2 sat: 98% Base Deficit: 2  One hour post extubation gas was as follows:  PH: 7.278 CO2: 59.7 PO2: 94 HCO3: 28 O2 Sat: 96% Base Deficit: 1  Dr. Tyrone SageGerhardt made aware of results. Verbal order for BiPAP given with a follow-up ABG in one hour. Will follow through with orders and continue to monitor.

## 2017-06-07 NOTE — Progress Notes (Signed)
CT Surgery Alert  BP stable Skin bleeding from chest tube incisions Hb down this pm Sutures placed to stop skin bleeding and PRBC transfusion ordered

## 2017-06-07 NOTE — Op Note (Signed)
NAMDominic Pea:  Dyer, Samantha Dyer               ACCOUNT NO.:  [id number]  MEDICAL RECORD NO.:  [id number]  LOCATION:  3E30C                        FACILITY:  [facility]  PHYSICIAN:  Salvatore DecentSteven C. Dorris FetchHendrickson, M.D.DATE OF BIRTH:  07/03/1951  DATE OF PROCEDURE:  06/06/2017 DATE OF DISCHARGE:                              OPERATIVE REPORT   PREOPERATIVE DIAGNOSIS:  Three-vessel coronary artery disease.  POSTOPERATIVE DIAGNOSIS:  Three-vessel coronary artery disease.  PROCEDURES:  Median sternotomy, extracorporeal circulation, Coronary artery bypass grafting x 4  Left internal mammary artery to left anterior descending,  Ssaphenous vein graft to posterior descending,  Sequential saphenous vein graft to ramus intermedius 1 and 2, Endoscopic vein harvest of right leg.  SURGEON:  Salvatore DecentSteven C. Dorris FetchHendrickson, MD.  ASSISTANLowella Dandy:  Erin Barrett, PA-C.  ANESTHESIA:  General.  FINDINGS:  Transesophageal echocardiography showed ejection fraction of approximately 45%, mild mitral regurgitation, and descending aortic Atherosclerosis. Good quality conduits.  All coronaries diffusely diseased.  LAD and ramus branches good quality at the site of anastomosis.  Posterior descending fair quality at the site of anastomosis.  CLINICAL NOTE:  Samantha Dyer is a 66 year old woman with a history of tobacco abuse and COPD, who presented with shortness of breath.  She was found to be in pulmonary edema.  She ruled in for a non-ST elevation MI. An echocardiogram showed moderate left ventricular dysfunction with anterior and inferior hypokinesis.  Ejection fraction was approximately 40%.  After extubation, she noted that she had been having "indigestion" for about 2 months prior to admission.  She underwent cardiac catheterization, which revealed three-vessel coronary artery disease. She was referred for coronary artery bypass grafting.  She was felt to be high risk, but not prohibitive from the standpoint of pulmonary function and was  offered the option of coronary artery bypass grafting. The indications, risks, benefits, and alternatives were discussed in detail with the patient.  She understood the high risk nature of the procedure and wished to proceed.  OPERATIVE NOTE:  Samantha Dyer was brought to the preoperative holding area on June 06, 2017.  Anesthesia placed a Swan-Ganz catheter and an arterial blood pressure monitoring line.  She was taken to the operating room, anesthetized, and intubated.  A Foley catheter was placed. Transesophageal echocardiography was performed, findings as noted above. The chest, abdomen, and legs were prepped and draped in usual sterile fashion.  A median sternotomy was performed and the left internal mammary artery was harvested in standard fashion.  Of note, there were significant sternal osteoporosis and hyperinflation of the lungs.  Simultaneously with the mammary harvest, an incision was made in the medial aspect of the right leg at the level of the knee.  The greater saphenous vein was harvested from the calf to the groin endoscopically.  2000 units of heparin were administered during the vessel harvest.  The remainder of full heparin dose was given prior to opening the pericardium.  After harvesting the conduits, the pericardium was opened.  The ascending aorta was inspected.  There was some plaque laterally near the takeoff of the innominate, but otherwise it was relatively free of disease. The remainder of the full heparin dose was given.  After confirming adequate anticoagulation with  ACT measurement, the aorta was cannulated via concentric 2-0 Ethibond pledgeted pursestring sutures.  A dual-stage venous cannula was placed via a pursestring suture in the right atrial appendage.  Cardiopulmonary bypass was initiated.  Flows were maintained per protocol.  The patient was cooled to 32 degrees Celsius.  The coronary arteries were inspected and anastomotic sites were chosen.   The conduits were inspected and cut to length.  A foam pad was placed in the pericardium to insulate the heart.  A temperature probe was placed in the myocardium, and a cardioplegia cannula was placed in the ascending aorta.  The aorta was crossclamped.  The left ventricle was emptied via the aortic root vent.  Cardiac arrest then was achieved with a combination of cold antegrade blood cardioplegia and topical iced saline.  One liter of cardioplegia was administered.  There was rapid diastolic arrest, and there was septal cooling to 10 degrees Celsius.  A reversed saphenous vein graft was placed end to side to the posterior descending branch of the right coronary artery.  This was placed just distal to its origin from the right coronary artery.  It was a 1.5-mm vessel at the site, but did have lateral plaque.  It was fair quality. There was diffuse disease within the vessel.  The vein was good quality. It was anastomosed end-to-side with a running 7-0 Prolene suture.  There was good flow through the graft, and there was good hemostasis with cardioplegia administration.  Next, a reversed saphenous vein graft was placed sequentially to the first and second branches of the ramus intermedius.  The ramus was a large dominant anterolateral branch that bifurcated.  There was significant disease at the bifurcation.  A side-to-side anastomosis was performed to the first branch and an end-to-side to the second.  The second branch was superficially intramyocardial.  Both of these were 2-mm target vessels that were good quality at the site of anastomosis, but there was plaque within the vessels.  At completion of each anastomosis, a probe passed easily proximally and distally.  Cardioplegia was administered down the graft.  There were good flow and good hemostasis.  Additional cardioplegia was administered down the aortic root.  The left internal mammary artery was brought through a window in  the pericardium. The distal end was beveled and it was anastomosed end-to-side to the mid LAD.  The LAD was a 1.5-mm target vessel with some lateral plaque, but overall relatively good quality vessel.  The end-to-side anastomosis was performed with a running 8-0 Prolene suture.  At the completion of anastomosis, the bulldog clamp was briefly removed.  Rapid septal rewarming was noted.  The bulldog clamp was replaced.  The mammary pedicle was tacked to the epicardial surface of the heart with 6-0 Prolene sutures.  Additional cardioplegia was administered.  The vein grafts were cut to length.  The cardioplegia cannula was removed from the ascending aorta. The proximal vein graft anastomoses were performed to 4.5-mm punch aortotomies with running 6-0 Prolene sutures.  At the completion of final proximal anastomosis, the patient was placed in Trendelenburg position.  Lidocaine was administered.  The aortic root was de-aired and the aortic crossclamp was removed.  Total crossclamp time was 66 minutes.  The patient required a single defibrillation with 10 joules and was in sinus rhythm thereafter.  While rewarming was completed, all proximal and distal anastomoses were inspected for hemostasis.  Epicardial pacing wires were placed on the right ventricle and right atrium.  The patient was loaded with  milrinone and an infusion was initiated at 0.125 mcg/kg per minute.  When the patient had rewarmed to a core temperature of 37 degrees Celsius, she was weaned from cardiopulmonary bypass on the first attempt.  Total bypass time was 102 minutes.  The initial cardiac index was less than 2, but with volume administration and atrial pacing to 90 beats per minute, the cardiac index improved and the patient remained hemodynamically stable throughout the post-bypass period.  Post-bypass transesophageal echocardiography was unchanged from the prebypass study, with the exception of slightly less mitral  regurgitation.  A test dose of protamine was administered and it was well tolerated. The atrial and aortic cannulae were removed.  The remainder of the protamine was administered without incident.  The chest was irrigated with warm saline.  Hemostasis was achieved.  Left pleural and mediastinal chest tubes were placed through separate subcostal incisions.  The pericardium was not closed.  The sternum was closed with a combination of single and double heavy gauge stainless steel wires. The pectoralis fascia, subcutaneous tissue, and skin were closed in standard fashion.  All sponge, needle, and instrument counts were correct at the end of the procedure.  The patient was taken from the operating room to the Surgical Intensive Care Unit intubated and in good condition.     Salvatore Decent Dorris Fetch, M.D.     SCH/MEDQ  D:  06/06/2017  T:  06/07/2017  Job:  [id number]

## 2017-06-07 NOTE — Progress Notes (Signed)
2CRITICAL VALUE ALERT  Critical Value:  Hgb 6.6  Date & Time Notied:  06/07/2017 at 1705  Provider Notified: Dr Donata ClayVan Trigt at bedside evening rounding  Orders Received/Actions taken: 2 unit PRBC

## 2017-06-07 NOTE — Progress Notes (Signed)
10 mins of manual pressure held on patients left chest tube site, site still active bleeding. RN redressed the gauze at this time for the 2x since 7am.

## 2017-06-07 NOTE — Procedures (Signed)
Extubation Procedure Note  Patient Details:   Name: Samantha Dyer DOB: 04-24-1951 MRN: [id number]   Airway Documentation:   Pt extubated following successful Cardiac Rapid Wean. Pt follows all commands. NIF -20, VC 580mls, cuff leak +. No stridor post extubation. Pt A&O.   Evaluation  O2 sats: stable throughout Complications: No apparent complications Patient did tolerate procedure well. Bilateral Breath Sounds: Clear   Yes  Samantha Dyer, Samantha Dyer 06/07/2017, 1:44 AM

## 2017-06-07 NOTE — Progress Notes (Signed)
RT NOTE:  Cardiac Rapid Wean initiated.  

## 2017-06-07 NOTE — Care Management Note (Signed)
Case Management Note Original Note Created Cherylann ParrClaxton, Samantha S, RN 05/31/2017, 2:07 PM   Patient Details  Name: Samantha Dyer MRN: [id number] Date of Birth: [birth year]  Subjective/Objective:   Pt admitted with NSTEMI                  Action/Plan:  PTA independent from home with husband on supplemental 02 (2 liters during the day and 2.5 at night [facility].  Pt has PCP and denied barriers to obtaining/paying for medications.  Pt to discharge home on home ventilator per attending.  CM [facility] pt [facility] and [facility]  [facility] - per insurance requirement of 1 DME supplier per pt.   Expected Discharge Date:                  Expected Discharge Plan:  Home w Home Health Services  In-House Referral:     Discharge planning Services  CM Consult  Post Acute Care Choice:  Durable Medical Equipment Choice offered to:  Patient, Spouse  DME Arranged:  Ventilator DME Agency:  [facility]  HH Arranged:    HH Agency:     Status of Service:  In process, will continue to follow  If discussed at Long Length of Stay Meetings, dates discussed:    Discharge Disposition:   Additional Comments:  06/07/17- 1000- Donn PieriniKristi Ryaan Vanwagoner RN, CM- [facility] post op CABGx4 on 06/06/17-  CM to continue to follow for d/c needs- [facility] was being set up for home NIV- and home 02- prior to discharge will need to re-eval need for NIV and document if needed- pt will also need to have new documentation for home 02 qualification.   Zenda AlpersWebster, Rich CreekKristi Hall, RN 06/07/2017, 10:16 AM [phone]

## 2017-06-07 NOTE — Addendum Note (Signed)
Addendum  created 06/07/17 1009 by Lewie LoronGermeroth, Ilayda Toda, MD   Anesthesia Event edited, Anesthesia Staff edited

## 2017-06-07 NOTE — Progress Notes (Signed)
1 Day Post-Op Procedure(s) (LRB): CORONARY ARTERY BYPASS GRAFTING x 4 USING LEFT INTERNAL MAMMARY ARTERY AND RIGHT GREATER SAPHENOUS VEIN HARVESTED ENDOSCOPICALLY -LIMA to LAD -SVG to PDA -SEQ SVG to RAMUS 1 and RAMUS 2 (N/A) TRANSESOPHAGEAL ECHOCARDIOGRAM (TEE) (N/A) Subjective: "I feel terrible, I can't breathe and I hurt"  Objective: Vital signs in last 24 hours: Temp:  [95.5 F (35.3 C)-98.4 F (36.9 C)] 98.4 F (36.9 C) (10/18 0830) Pulse Rate:  [79-94] 87 (10/18 0832) Cardiac Rhythm: Atrial paced (10/18 0800) Resp:  [0-33] 33 (10/18 0832) BP: (75-130)/(36-81) 119/44 (10/18 0830) SpO2:  [88 %-100 %] 96 % (10/18 0832) Arterial Line BP: (47-117)/(32-61) 70/38 (10/18 0830) FiO2 (%):  [40 %-50 %] 40 % (10/18 0825) Weight:  [151 lb 0.2 oz (68.5 kg)] 151 lb 0.2 oz (68.5 kg) (10/18 0500)  Hemodynamic parameters for last 24 hours: PAP: (20-47)/(3-23) 29/13 CO:  [3.9 L/min-4.8 L/min] 4.8 L/min CI:  [2.4 L/min/m2-2.9 L/min/m2] 2.9 L/min/m2  Intake/Output from previous day: 10/17 0701 - 10/18 0700 In: 3910.8 [I.V.:1639.8; Blood:831; NG/GT:40; IV Piggyback:1400] Out: 2975 [Urine:1205; Blood:650; Chest Tube:1120] Intake/Output this shift: Total I/O In: 77.7 [I.V.:77.7] Out: 45 [Urine:25; Chest Tube:20]  General appearance: alert, cooperative and no distress Neurologic: intact Heart: tachy, regular Lungs: rhonchi bilaterally Abdomen: normal findings: soft, non-tender  Lab Results:  Recent Labs  06/06/17 1820 06/06/17 2337 06/07/17 0313  WBC 18.1*  --  14.0*  HGB 6.6* 8.9* 8.2*  HCT 19.3* 25.7* 23.9*  PLT 148*  --  121*   BMET:  Recent Labs  06/06/17 0551  06/06/17 1815 06/06/17 1820 06/07/17 0313  NA 130*  < > 132*  --  131*  K 3.4*  < > 4.7  --  5.0  CL 89*  < > 92*  --  98*  CO2 34*  --   --   --  27  GLUCOSE 84  < > 108*  --  107*  BUN 16  < > 12  --  16  CREATININE 0.84  < > 0.70 0.69 0.82  CALCIUM 8.6*  --   --   --  7.7*  < > = values in this interval  not displayed.  PT/INR:  Recent Labs  06/06/17 2337  LABPROT 17.3*  INR 1.43   ABG    Component Value Date/Time   PHART 7.333 (L) 06/07/2017 0131   HCO3 28.5 (H) 06/07/2017 0131   TCO2 30 06/07/2017 0131   O2SAT 98.0 06/07/2017 0131   CBG (last 3)   Recent Labs  06/06/17 2342 06/07/17 0319 06/07/17 0819  GLUCAP 93 110* 110*    Assessment/Plan: S/P Procedure(s) (LRB): CORONARY ARTERY BYPASS GRAFTING x 4 USING LEFT INTERNAL MAMMARY ARTERY AND RIGHT GREATER SAPHENOUS VEIN HARVESTED ENDOSCOPICALLY -LIMA to LAD -SVG to PDA -SEQ SVG to RAMUS 1 and RAMUS 2 (N/A) TRANSESOPHAGEAL ECHOCARDIOGRAM (TEE) (N/A) POD # 1  CV- hemodynamics stable, good cardiac function- dc swan  Filling pressure low- will give albumin this AM  RESP- continue IS, nebs, will resume prednisone taper  RENAL- creatinine and lytes OK  Lasix to stimulate UO after albumin given  ENDO- CBG well controlled  Anemia secondary to ABL- better after transfusion- follow  Will keep CT for now  SCD for DVT prophylaxis, hold off on enoxaparin due to bleeding   LOS: 9 days    Loreli SlotSteven C Hendrickson 06/07/2017

## 2017-06-07 NOTE — Progress Notes (Signed)
RT NOTE:  Pt placed on NIV/PC post Cardiac extubation following ABG. Pt WOB increased. NIV/PC: 14/4, rr: 12, Fio2 40%. Pt tolerating well. Pt agrees she feels relief with BIPAP mask. RN will repeat ABG.

## 2017-06-08 ENCOUNTER — Inpatient Hospital Stay (HOSPITAL_COMMUNITY): Payer: Medicare Other

## 2017-06-08 LAB — BPAM RBC
BLOOD PRODUCT EXPIRATION DATE: 201811052359
BLOOD PRODUCT EXPIRATION DATE: 201811062359
Blood Product Expiration Date: 201811152359
Blood Product Expiration Date: 201811152359
ISSUE DATE / TIME: 201810171822
ISSUE DATE / TIME: 201810172033
ISSUE DATE / TIME: 201810181741
ISSUE DATE / TIME: 201810182055
UNIT TYPE AND RH: 9500
Unit Type and Rh: 5100
Unit Type and Rh: 5100
Unit Type and Rh: 9500

## 2017-06-08 LAB — CBC
HEMATOCRIT: 25.7 % — AB (ref 36.0–46.0)
HEMOGLOBIN: 8.6 g/dL — AB (ref 12.0–15.0)
MCH: 29.6 pg (ref 26.0–34.0)
MCHC: 33.5 g/dL (ref 30.0–36.0)
MCV: 88.3 fL (ref 78.0–100.0)
Platelets: 49 10*3/uL — ABNORMAL LOW (ref 150–400)
RBC: 2.91 MIL/uL — AB (ref 3.87–5.11)
RDW: 15 % (ref 11.5–15.5)
WBC: 8 10*3/uL (ref 4.0–10.5)

## 2017-06-08 LAB — TYPE AND SCREEN
ABO/RH(D): O NEG
ANTIBODY SCREEN: NEGATIVE
UNIT DIVISION: 0
UNIT DIVISION: 0
Unit division: 0
Unit division: 0

## 2017-06-08 LAB — POCT I-STAT 3, ART BLOOD GAS (G3+)
ACID-BASE DEFICIT: 1 mmol/L (ref 0.0–2.0)
ACID-BASE EXCESS: 1 mmol/L (ref 0.0–2.0)
ACID-BASE EXCESS: 2 mmol/L (ref 0.0–2.0)
ACID-BASE EXCESS: 3 mmol/L — AB (ref 0.0–2.0)
Bicarbonate: 28 mmol/L (ref 20.0–28.0)
Bicarbonate: 26.3 mmol/L (ref 20.0–28.0)
Bicarbonate: 29.2 mmol/L — ABNORMAL HIGH (ref 20.0–28.0)
Bicarbonate: 29.1 mmol/L — ABNORMAL HIGH (ref 20.0–28.0)
O2 SAT: 96 %
O2 SAT: 99 %
O2 SAT: 99 %
O2 SAT: 100 %
PH ART: 7.278 — AB (ref 7.350–7.450)
PH ART: 7.286 — AB (ref 7.350–7.450)
PH ART: 7.303 — AB (ref 7.350–7.450)
PH ART: 7.336 — AB (ref 7.350–7.450)
PO2 ART: 136 mmHg — AB (ref 83.0–108.0)
PO2 ART: 183 mmHg — AB (ref 83.0–108.0)
Patient temperature: 36.7
Patient temperature: 36.8
Patient temperature: 36.8
Patient temperature: 36.9
TCO2: 30 mmol/L (ref 22–32)
TCO2: 28 mmol/L (ref 22–32)
TCO2: 31 mmol/L (ref 22–32)
TCO2: 31 mmol/L (ref 22–32)
pCO2 arterial: 59.7 mmHg — ABNORMAL HIGH (ref 32.0–48.0)
pCO2 arterial: 55.2 mmHg — ABNORMAL HIGH (ref 32.0–48.0)
pCO2 arterial: 58.9 mmHg — ABNORMAL HIGH (ref 32.0–48.0)
pCO2 arterial: 54.4 mmHg — ABNORMAL HIGH (ref 32.0–48.0)
pO2, Arterial: 94 mmHg (ref 83.0–108.0)
pO2, Arterial: 139 mmHg — ABNORMAL HIGH (ref 83.0–108.0)

## 2017-06-08 LAB — BASIC METABOLIC PANEL
ANION GAP: 7 (ref 5–15)
BUN: 17 mg/dL (ref 6–20)
CHLORIDE: 96 mmol/L — AB (ref 101–111)
CO2: 28 mmol/L (ref 22–32)
Calcium: 8.1 mg/dL — ABNORMAL LOW (ref 8.9–10.3)
Creatinine, Ser: 0.94 mg/dL (ref 0.44–1.00)
Glucose, Bld: 112 mg/dL — ABNORMAL HIGH (ref 65–99)
POTASSIUM: 4.8 mmol/L (ref 3.5–5.1)
SODIUM: 131 mmol/L — AB (ref 135–145)

## 2017-06-08 LAB — GLUCOSE, CAPILLARY: Glucose-Capillary: 115 mg/dL — ABNORMAL HIGH (ref 65–99)

## 2017-06-08 MED ORDER — SODIUM CHLORIDE 0.9 % IV SOLN
250.0000 mL | INTRAVENOUS | Status: DC | PRN
Start: 2017-06-08 — End: 2017-06-08

## 2017-06-08 MED ORDER — SODIUM CHLORIDE 0.9% FLUSH: 3.0000 mL | Freq: Two times a day (BID) | INTRAVENOUS | Status: DC

## 2017-06-08 MED ORDER — ASPIRIN EC 81 MG PO TBEC
81.0000 mg | DELAYED_RELEASE_TABLET | Freq: Every day | ORAL | Status: DC
Start: 2017-06-08 — End: 2017-06-14
  Administered 2017-06-08 – 2017-06-14 (×7): 81 mg via ORAL
  Filled 2017-06-08 (×7): qty 1

## 2017-06-08 MED ORDER — SODIUM CHLORIDE 0.9% FLUSH: 3.0000 mL | INTRAVENOUS | Status: DC | PRN

## 2017-06-08 MED FILL — Potassium Chloride Inj 2 mEq/ML: INTRAVENOUS | Qty: 40 | Status: AC

## 2017-06-08 MED FILL — Heparin Sodium (Porcine) Inj 1000 Unit/ML: INTRAMUSCULAR | Qty: 30 | Status: AC

## 2017-06-08 MED FILL — Dexmedetomidine HCl in NaCl 0.9% IV Soln 400 MCG/100ML: INTRAVENOUS | Qty: 100 | Status: AC

## 2017-06-08 MED FILL — Magnesium Sulfate Inj 50%: INTRAMUSCULAR | Qty: 10 | Status: AC

## 2017-06-08 NOTE — Discharge Summary (Signed)
Physician Discharge Summary  Patient ID: Samantha Dyer MRN: [id number] DOB/AGE: [birth year] 66 y.o.  Admit date: 05/28/2017 Discharge date: 06/14/2017  Admission Diagnoses:  Patient Active Problem List   Diagnosis Date Noted  . Ischemic cardiomyopathy   . Hypokalemia   . Pure hyperglyceridemia   . Non-ST elevation (NSTEMI) myocardial infarction (HCC)   . Systolic congestive heart failure (HCC)   . Acute on chronic respiratory failure with hypoxia and hypercapnia (HCC) 05/29/2017  . COPD exacerbation (HCC)   . Acute respiratory failure with hypoxia and hypercapnia (HCC)   . Acute diastolic (congestive) heart failure (HCC)   . Hyponatremia    Discharge Diagnoses:   Patient Active Problem List   Diagnosis Date Noted  . S/P CABG x 4 06/06/2017  . Ischemic cardiomyopathy   . Hypokalemia   . Pure hyperglyceridemia   . Non-ST elevation (NSTEMI) myocardial infarction (HCC)   . Systolic congestive heart failure (HCC)   . Acute on chronic respiratory failure with hypoxia and hypercapnia (HCC) 05/29/2017  . COPD exacerbation (HCC)   . Acute respiratory failure with hypoxia and hypercapnia (HCC)   . Acute diastolic (congestive) heart failure (HCC)   . Hyponatremia   Postoperative atrial fibrillation Heparin induced thrombocytopenia without thrombosis  Discharged Condition: good  History of Present Illness:  Mrs. Samantha Dyer is a 66 yo female with known history of Stage Gold IV COPD on home oxygen routinely followed by Dr. Vassie [name].  She presented with complaints of shortness of breath on 05/28/2017.  Workup showed patient to be hypoxic with acute on chronic respiratory failure.  She was intubated it the ED.  CXR obtained showed pulmonary edema with an elevated BNP of 1600.  She also ruled in for NSTEMI at that time.   Hospital Course:   She was treated with aggressive diuresis.  She was weaned and extubated on 05/30/2017.  She had an Echocardiogram which showed reduced LV function of 40-45%  with anterior and inferior hypokinesis.  She underwent cardiac catheterization which showed severe multivessel CAD.  It was felt coronary bypass grafting would be indicated and TCTS consult was obtained.  She was evaluated by Dr. Dorris FetchHendrickson who states patient would be a candidate for bypass pending, Pulmonary clearance and Duplex studies for bilateral carotid bruits.  Her PFTs show the patient to be high risk for surgery.  They were not prohibitive.  She was counseled at length on pulmonary complications/risks.  Her carotid duplexes showed moderate bilateral stenosis/placque.  The risks and benefits of the procedure were explained to the patient and she was agreeable to proceed.  She was taken to the operating room on 06/06/2017.  She underwent CABG x 4 utilizing LIMA to LAD, SVG to PDA, and Sequential SVG to Ramus 1 and Ramus 2.  She also underwent endoscopic harvest of greater saphenous vein from right leg.  She tolerated the procedure without difficulty and was taken to the SICU in stable condition.  Post operatively she had expected blood loss anemia.  She was transfused packed cells for this.  During her stay in the SICU the patient was weaned and extubated without difficulty.  She was restarted on her Prednisone taper for previous COPD exacerbation.  She was given albumin to help with decreased filling pressures.  She continued to have some blood loss around her chest tube sites.  She again required transfusion of packed cells for decrease in Hemoglobin. Additional sutures were placed to help stop further bleeding.  Her chest tubes and arterial lines were removed  without difficulty on POD #2.  She was aggressively diuresed with IV lasix.  Her platelet count dropped to 49K.  Due to this and anemia HIT panel was obtained and she was not given any further heparin or Lovenox dosing.  Her HIT panel came back elevated at 2.5, however her platelet count was improving and she had no evidence bleeding so no further  treatment was not indicated.  She developed rapid atrial fibrillation.  She was treated with IV amiodarone with successful conversion to NSR.  She was transitioned to oral amiodarone.  She was volume overloaded and treated with Lasix and Metolazone.  She was started on iron therapy for expected post operative blood loss anemia.  She was felt medically stable for transfer to the telemetry unit on 06/11/2017.  The patient continued to make progress.  She continued to maintain NSR with PACs.  Her pacing wires were removed without difficulty.  She was dependent on oxygen prior to admission.  CXR showed bilateral atelectasis and pleural effusions.  She was weaned off steroids per pulmonary recommendations.  She is ambulating with minimal difficulty.  Her pain is well controlled.  She is tolerating a diet.  She has no desire to quit smoking, but she has been counseled on the importance of smoking cessation.  She is medically stable for discharge home today.                     Consults: cardiology and pulmonary/intensive care  Significant Diagnostic Studies: angiography:    Ost LAD lesion, 90 %stenosed. Complex, calcified trifurcation lesion with ramus.  Ost Ramus lesion, 90 %stenosed. The ramus has two large branches which cover the lateral wall. The circumflex is relatively small.  Ost RCA to Prox RCA lesion, 100 %stenosed. There are left to right collaterals.  Prox Cx lesion, 70 %stenosed.  LV end diastolic pressure is normal.  The left ventricular ejection fraction is 35-45% by visual estimate.  There is no aortic valve stenosis.  Normal PA pressures; Ao 89%, PA 70%, CO 7.2 L/min; CI 4.4   Severe three vessel CAD.  Plan for CVTS consult for CABG.   Treatments: surgery:    Median sternotomy, extracorporeal circulation, coronary artery bypass grafting x4 (left internal mammary artery to left anterior descending, saphenous vein graft to posterior descending, sequential saphenous vein graft to  ramus intermedius 1 and 2), endoscopic vein harvest of right leg.  Disposition: Home  Discharge Medications:  The patient has been discharged on:   1.Beta Blocker:  Yes [ x ]                              No   [   ]                              If No, reason:  2.Ace Inhibitor/ARB: Yes [   ]                                     No  [  x  ]                                     If No, reason: BP Labile  3.Statin:  Yes [ x  ]                  No  [   ]                  If No, reason:  4.Ecasa:  Yes  [ x  ]                  No   [   ]                  If No, reason:     Discharge Instructions    Amb Referral to Cardiac Rehabilitation    Complete by:  As directed    Diagnosis:   CABG NSTEMI     CABG X ___:  4     Allergies as of 06/14/2017      Reactions   Augmentin [amoxicillin-pot Clavulanate] Swelling      Medication List    TAKE these medications   acetaminophen 325 MG tablet Commonly known as:  TYLENOL Take 650 mg by mouth every 6 (six) hours as needed for mild pain.   amiodarone 400 MG tablet Commonly known as:  PACERONE Take 1 tablet (400 mg total) by mouth 2 (two) times daily. For 7 Days then decrease to 400 mg daily   aspirin 81 MG EC tablet Take 1 tablet (81 mg total) by mouth daily.   atorvastatin 80 MG tablet Commonly known as:  LIPITOR Take 1 tablet (80 mg total) by mouth daily at 6 PM.   BREO ELLIPTA 200-25 MCG/INH Aepb Generic drug:  fluticasone furoate-vilanterol Inhale 1 puff into the lungs daily.   esomeprazole 40 MG capsule Commonly known as:  NEXIUM Take 40 mg by mouth daily at 12 noon.   ferrous gluconate 324 MG tablet Commonly known as:  FERGON Take 1 tablet (324 mg total) by mouth 2 (two) times daily with a meal.   furosemide 40 MG tablet Commonly known as:  LASIX Take 1 tablet (40 mg total) by mouth 2 (two) times daily. For 7 Days, then decrease to 40 mg daily What changed:  when to take this  additional instructions    guaiFENesin 600 MG 12 hr tablet Commonly known as:  MUCINEX Take 600 mg by mouth 2 (two) times daily as needed.   metoprolol tartrate 25 MG tablet Commonly known as:  LOPRESSOR Take 1 tablet (25 mg total) by mouth 2 (two) times daily.   OXYGEN Inhale 2 L into the lungs continuous.   potassium chloride SA 20 MEQ tablet Commonly known as:  K-DUR,KLOR-CON Take 1 tablet (20 mEq total) by mouth 2 (two) times daily. For 7 Days, then decrease to 20 mg daily   tiotropium 18 MCG inhalation capsule Commonly known as:  SPIRIVA Place 18 mcg into inhaler and inhale daily.   traMADol 50 MG tablet Commonly known as:  ULTRAM Take 1 tablet (50 mg total) by mouth every 4 (four) hours as needed for moderate pain.      Follow-up Information    Loreli Slot, MD Follow up.   Specialty:  Cardiothoracic Surgery Why:  Your appointment is on 07/10/2017 at 10:45am. Please arrive at 10:15am for a chest xray located at [facility] which is on the first floor of our building.  Contact information: [address] [place] [address] [phone]        Abelino Derrick, PA-C Follow up on 06/29/2017.  Specialties:  Cardiology, Radiology Why:  Appointment is at 10:30 Contact information: [address] [place] [address] [phone]           Signed: Lowella Dandy 06/14/2017, 8:16 AM

## 2017-06-08 NOTE — Plan of Care (Signed)
Problem: Activity: Goal: Risk for activity intolerance will decrease Outcome: Progressing Pt ambulated around entire unit once today and tolerated it well.   Problem: Cardiac: Goal: Hemodynamic stability will improve Outcome: Progressing Pt is off pressors and has maintained adequate BP, HR, Rhythm, UOP.  Problem: Coping: Goal: Ability to adjust to condition or change in health will improve Outcome: Not Progressing Pt appears to be withdrawn. Emotional support given to patient.   Problem: Respiratory: Goal: Levels of oxygenation will improve Outcome: Progressing Pt is tolerating 2L [place] well.

## 2017-06-08 NOTE — Progress Notes (Signed)
Patient ID: Samantha PeaKathy Cottone, female   DOB: 05-08-1951, 66 y.o.   MRN: [id number]  TCTS Evening Rounds:  Hemodynamically stable in sinus rhythm  sats 100%  Diuresed some today  Draining from chest tube sites.

## 2017-06-08 NOTE — Progress Notes (Signed)
Progress Note  Patient Name: Samantha Dyer Date of Encounter: 06/08/2017  Primary Cardiologist: Dr Allyson Sabal  Subjective   Feeling okay.Breathing is stable. No severe pain.  Inpatient Medications    Scheduled Meds: . acetaminophen  1,000 mg Oral Q6H   Or  . acetaminophen (TYLENOL) oral liquid 160 mg/5 mL  1,000 mg Per Tube Q6H  . albuterol  2.5 mg Nebulization TID  . aspirin EC  81 mg Oral Daily  . atorvastatin  80 mg Oral q1800  . bisacodyl  10 mg Oral Daily   Or  . bisacodyl  10 mg Rectal Daily  . chlorhexidine  15 mL Mouth Rinse BID  . docusate sodium  200 mg Oral Daily  . fluticasone furoate-vilanterol  1 puff Inhalation Daily  . furosemide  20 mg Intravenous BID  . mouth rinse  15 mL Mouth Rinse q12n4p  . metoprolol tartrate  12.5 mg Oral BID   Or  . metoprolol tartrate  12.5 mg Per Tube BID  . nicotine  21 mg Transdermal Daily  . pantoprazole  40 mg Oral Daily  . predniSONE  20 mg Oral QAC breakfast  . sodium chloride flush  3 mL Intravenous Q12H  . sodium chloride flush  3 mL Intravenous Q12H  . tiotropium  18 mcg Inhalation Daily   Continuous Infusions: . sodium chloride Stopped (06/07/17 1015)  . sodium chloride 10 mL/hr at 06/07/17 1743  . sodium chloride    . lactated ringers    . lactated ringers    . lactated ringers Stopped (06/08/17 1000)  . nitroGLYCERIN Stopped (06/06/17 1330)  . phenylephrine (NEO-SYNEPHRINE) Adult infusion Stopped (06/08/17 0400)   PRN Meds: sodium chloride, sodium chloride, ALPRAZolam, lactated ringers, metoprolol tartrate, morphine injection, ondansetron (ZOFRAN) IV, oxyCODONE, sodium chloride flush, sodium chloride flush, traMADol   Vital Signs    Vitals:   06/08/17 0800 06/08/17 0815 06/08/17 0900 06/08/17 1000  BP: (!) 124/48  (!) 133/44 (!) 72/52  Pulse: 87  91 92  Resp: 16  18 18   Temp:  (!) 97.4 F (36.3 C)    TempSrc:  Oral    SpO2: 100%  100% 97%  Weight:      Height:        Intake/Output Summary (Last 24  hours) at 06/08/17 1026 Last data filed at 06/08/17 1000  Gross per 24 hour  Intake          2220.63 ml  Output             2000 ml  Net           220.63 ml   Filed Weights   06/06/17 0525 06/07/17 0500 06/08/17 0500  Weight: 134 lb 6.4 oz (61 kg) 151 lb 0.2 oz (68.5 kg) 149 lb 11.1 oz (67.9 kg)    Telemetry    Sinus rhythm, sinus tachycardia, no sustained arrhythmia - Personally Reviewed   Physical Exam  Alert, oriented woman in no distress GEN: No acute distress.   Neck: No JVD Cardiac: RRR, no murmurs, rubs, or gallops.  Respiratory: Clear to auscultation bilaterally. GI: Soft, nontender, non-distended  MS: trace bilateral pretibial edema; No deformity. Neuro:  Nonfocal  Psych: Normal affect   Labs    Chemistry Recent Labs Lab 06/06/17 0551  06/06/17 1815  06/07/17 0313 06/07/17 1616 06/08/17 0345  NA 130*  < > 132*  --  131*  --  131*  K 3.4*  < > 4.7  --  5.0  --  4.8  CL 89*  < > 92*  --  98*  --  96*  CO2 34*  --   --   --  27  --  28  GLUCOSE 84  < > 108*  --  107*  --  112*  BUN 16  < > 12  --  16  --  17  CREATININE 0.84  < > 0.70  < > 0.82 0.97 0.94  CALCIUM 8.6*  --   --   --  7.7*  --  8.1*  GFRNONAA >60  --   --   < > >60 60* >60  GFRAA >60  --   --   < > >60 >60 >60  ANIONGAP 7  --   --   --  6  --  7  < > = values in this interval not displayed.   Hematology Recent Labs Lab 06/07/17 0313 06/07/17 1616 06/08/17 0345  WBC 14.0* 11.1* 8.0  RBC 2.67* 2.11* 2.91*  HGB 8.2* 6.6* 8.6*  HCT 23.9* 19.1* 25.7*  MCV 89.5 90.5 88.3  MCH 30.7 31.3 29.6  MCHC 34.3 34.6 33.5  RDW 14.4 14.8 15.0  PLT 121* 82* 49*    Cardiac EnzymesNo results for input(s): TROPONINI in the last 168 hours. No results for input(s): TROPIPOC in the last 168 hours.   BNPNo results for input(s): BNP, PROBNP in the last 168 hours.   DDimer No results for input(s): DDIMER in the last 168 hours.   Radiology    Dg Chest Port 1 View  Result Date: 06/08/2017 CLINICAL  DATA:  Status post CABG 06/06/2017. EXAM: PORTABLE CHEST 1 VIEW COMPARISON:  Single-view of the chest 06/07/2017. FINDINGS: Swan-Ganz catheter has been removed. Right IJ sheath remains in place. Mediastinal drain and left chest tube are also again seen. Aeration in the right lung base has worsened with a new small effusion and basilar atelectasis. Left basilar atelectasis is not notably changed. There is a new very small left apical pneumothorax. No right pneumothorax. Heart size is upper normal. IMPRESSION: Worsened aeration in the right lung base with a new small effusion and basilar atelectasis. New small left apical pneumothorax, 5% or less, with a left chest tube in place Electronically Signed   By: Drusilla Kannerhomas  Dalessio M.D.   On: 06/08/2017 07:22   Dg Chest Port 1 View  Result Date: 06/07/2017 CLINICAL DATA:  Status post CABG yesterday.  History of COPD. EXAM: PORTABLE CHEST 1 VIEW COMPARISON:  Chest x-ray of June 06, 2017 FINDINGS: Interval extubation of the trachea and of the esophagus. The lungs are well-expanded. The retrocardiac region is more dense today. There is no pneumothorax. The the left chest tube and the mediastinal drain are in stable position. There is a trace left pleural effusion. The right lung is clear. The heart is top-normal in size. The pulmonary vascularity is minimally prominent centrally but stable. The Swan-Ganz catheter tip projects over the proximal right main pulmonary artery. There is calcification in the wall of the aortic arch. IMPRESSION: Interval development of left lower lobe atelectasis. Trace bilateral pleural effusions but no pneumothorax. No significant pulmonary vascular congestion. The remaining support tubes are in good position. Electronically Signed   By: David  SwazilandJordan M.D.   On: 06/07/2017 07:33   Dg Chest Port 1 View  Result Date: 06/06/2017 CLINICAL DATA:  Status post coronary artery bypass grafting. Hypoxia. EXAM: PORTABLE CHEST 1 VIEW COMPARISON:   June 02, 2017 FINDINGS: Endotracheal tube tip is  4.8 cm above the carina. Swan-Ganz catheter tip is in the right main pulmonary artery. Nasogastric tube tip and side port are below the diaphragm. There is a left chest tube and a mediastinal drain. Temporary pacemaker wires are attached to the right heart. No pneumothorax. There is mild interstitial pulmonary edema. No airspace consolidation. Heart is upper normal in size with mild pulmonary venous hypertension. There is aortic atherosclerosis. No bone lesions. Status post coronary artery bypass grafting. IMPRESSION: Tube and catheter positions as described without evident pneumothorax. Mild interstitial edema without airspace consolidation. Aortic atherosclerosis. Heart upper normal in size. Aortic Atherosclerosis (ICD10-I70.0). Electronically Signed   By: Bretta Bang III M.D.   On: 06/06/2017 14:22     Patient Profile     66 y.o. female with COPD and severe multivessel coronary artery disease diagnosed after she presented with progressive dyspnea and elevated troponin.  Assessment & Plan    1. Non-STEMI: Diagnosed with critical left main disease. Underwent multivessel CABG seems to be progressing well now postoperative day #2 from surgery  2. Thrombocytopenia: HIV panel has been sent off. The patient is not receiving any heparin products.  3. Hyperlipidemia: Treated with a high-intensity statin.  Patient currently stable from a cardiac perspective. Management per TCTS team. Currently being treated with a beta blocker, aspirin, and statin drug.   For questions or updates, please contact [facility] Please consult [email or web address] for contact info under Cardiology/STEMI.      Enzo Bi, MD  06/08/2017, 10:26 AM

## 2017-06-08 NOTE — Discharge Instructions (Signed)
1. Wash incisions daily with soap and water... You may shower, but not tub baths or swimming 2. No Driving for 4 weeks and you must be off all narcotic pain medication 3. Diet- resume heart healthy diet 4. Activity- up and ambulating at least 3 times per day 5. You are encouraged to stop smoking to promote longevity of your bypass grafts 6. Sternal precautions- no lifting, pushing, pulling with you arms over 8 pounds for 6 weeks    Coronary Artery Bypass Grafting, Care After This sheet gives you information about how to care for yourself after your procedure. Your health care provider may also give you more specific instructions. If you have problems or questions, contact your health care provider. What can I expect after the procedure? After the procedure, it is common to have:  Nausea and a lack of appetite.  Constipation.  Weakness and fatigue.  Depression or irritability.  Pain or discomfort in your incision areas.  Follow these instructions at home: Medicines  Take over-the-counter and prescription medicines only as told by your health care provider. Do not stop taking medicines or start any new medicines without approval from your health care provider.  If you were prescribed an antibiotic medicine, take it as told by your health care provider. Do not stop taking the antibiotic even if you start to feel better.  Do not drive or use heavy machinery while taking prescription pain medicine. Incision care  Follow instructions from your health care provider about how to take care of your incisions. Make sure you: ? Wash your hands with soap and water before you change your bandage (dressing). If soap and water are not available, use hand sanitizer. ? Change your dressing as told by your health care provider. ? Leave stitches (sutures), skin glue, or adhesive strips in place. These skin closures may need to stay in place for 2 weeks or longer. If adhesive strip edges start to  loosen and curl up, you may trim the loose edges. Do not remove adhesive strips completely unless your health care provider tells you to do that.  Keep incision areas clean, dry, and protected.  Check your incision areas every day for signs of infection. Check for: ? More redness, swelling, or pain. ? More fluid or blood. ? Warmth. ? Pus or a bad smell.  If incisions were made in your legs: ? Avoid crossing your legs. ? Avoid sitting for long periods of time. Change positions every 30 minutes. ? Raise (elevate) your legs when you are sitting. Bathing  Do not take baths, swim, or use a hot tub until your health care provider approves.  Only take sponge baths. Pat the incisions dry. Do not rub incisions with a washcloth or towel.  Ask your health care provider when you can shower. Eating and drinking  Eat foods that are high in fiber, such as raw fruits and vegetables, whole grains, beans, and nuts. Meats should be lean cut. Avoid canned, processed, and fried foods. This can help prevent constipation and is a recommended part of a heart-healthy diet.  Drink enough fluid to keep your urine clear or pale yellow.  Limit alcohol intake to no more than 1 drink a day for nonpregnant women and 2 drinks a day for men. One drink equals 12 oz of beer, 5 oz of wine, or 1 oz of hard liquor. Activity  Rest and limit your activity as told by your health care provider. You may be instructed to: ? Stop any  activity right away if you have chest pain, shortness of breath, irregular heartbeats, or dizziness. Get help right away if you have any of these symptoms. ? Move around frequently for short periods or take short walks as directed by your health care provider. Gradually increase your activities. You may need physical therapy or cardiac rehabilitation to help strengthen your muscles and build your endurance. ? Avoid lifting, pushing, or pulling anything that is heavier than 10 lb (4.5 kg) for at  least 6 weeks or as told by your health care provider.  Do not drive until your health care provider approves.  Ask your health care provider when you may return to work.  Ask your health care provider when you may resume sexual activity. General instructions  Do not use any products that contain nicotine or tobacco, such as cigarettes and e-cigarettes. If you need help quitting, ask your health care provider.  Take 2-3 deep breaths every few hours during the day, while you recover. This helps expand your lungs and prevent complications like pneumonia after surgery.  If you were given a device called an incentive spirometer, use it several times a day to practice deep breathing. Support your chest with a pillow or your arms when you take deep breaths or cough.  Wear compression stockings as told by your health care provider. These stockings help to prevent blood clots and reduce swelling in your legs.  Weigh yourself every day. This helps identify if your body is holding (retaining) fluid that may make your heart and lungs work harder.  Keep all follow-up visits as told by your health care provider. This is important. Contact a health care provider if:  You have more redness, swelling, or pain around any incision.  You have more fluid or blood coming from any incision.  Any incision feels warm to the touch.  You have pus or a bad smell coming from any incision  You have a fever.  You have swelling in your ankles or legs.  You have pain in your legs.  You gain 2 lb (0.9 kg) or more a day.  You are nauseous or you vomit.  You have diarrhea. Get help right away if:  You have chest pain that spreads to your jaw or arms.  You are short of breath.  You have a fast or irregular heartbeat.  You notice a "clicking" in your breastbone (sternum) when you move.  You have numbness or weakness in your arms or legs.  You feel dizzy or light-headed. Summary  After the  procedure, it is common to have pain or discomfort in the incision areas.  Do not take baths, swim, or use a hot tub until your health care provider approves.  Gradually increase your activities. You may need physical therapy or cardiac rehabilitation to help strengthen your muscles and build your endurance.  Weigh yourself every day. This helps identify if your body is holding (retaining) fluid that may make your heart and lungs work harder. This information is not intended to replace advice given to you by your health care provider. Make sure you discuss any questions you have with your health care provider. Document Released: 02/24/2005 Document Revised: 06/26/2016 Document Reviewed: 06/26/2016 Elsevier Interactive Patient Education  2018 [employer]   Endoscopic Saphenous Vein Harvesting, Care After Refer to this sheet in the next few weeks. These instructions provide you with information about caring for yourself after your procedure. Your health care provider may also give you more specific  instructions. Your treatment has been planned according to current medical practices, but problems sometimes occur. Call your health care provider if you have any problems or questions after your procedure. What can I expect after the procedure? After the procedure, it is common to have:  Pain.  Bruising.  Swelling.  Numbness.  Follow these instructions at home: Medicine  Take over-the-counter and prescription medicines only as told by your health care provider.  Do not drive or operate heavy machinery while taking prescription pain medicine. Incision care   Follow instructions from your health care provider about how to take care of the cut made during surgery (incision). Make sure you: ? Wash your hands with soap and water before you change your bandage (dressing). If soap and water are not available, use hand sanitizer. ? Change your dressing as told by your health care  provider. ? Leave stitches (sutures), skin glue, or adhesive strips in place. These skin closures may need to be in place for 2 weeks or longer. If adhesive strip edges start to loosen and curl up, you may trim the loose edges. Do not remove adhesive strips completely unless your health care provider tells you to do that.  Check your incision area every day for signs of infection. Check for: ? More redness, swelling, or pain. ? More fluid or blood. ? Warmth. ? Pus or a bad smell. General instructions  Raise (elevate) your legs above the level of your heart while you are sitting or lying down.  Do any exercises your health care providers have given you. These may include deep breathing, coughing, and walking exercises.  Do not shower, take baths, swim, or use a hot tub unless told by your health care provider.  Wear your elastic stocking if told by your health care provider.  Keep all follow-up visits as told by your health care provider. This is important. Contact a health care provider if:  Medicine does not help your pain.  Your pain gets worse.  You have new leg bruises or your leg bruises get bigger.  You have a fever.  Your leg feels numb.  You have more redness, swelling, or pain around your incision.  You have more fluid or blood coming from your incision.  Your incision feels warm to the touch.  You have pus or a bad smell coming from your incision. Get help right away if:  Your pain is severe.  You develop pain, tenderness, warmth, redness, or swelling in any part of your leg.  You have chest pain.  You have trouble breathing. This information is not intended to replace advice given to you by your health care provider. Make sure you discuss any questions you have with your health care provider. Document Released: 04/19/2011 Document Revised: 01/13/2016 Document Reviewed: 06/21/2015 Elsevier Interactive Patient Education  2018 [employer]

## 2017-06-08 NOTE — Progress Notes (Signed)
2 Days Post-Op Procedure(s) (LRB): CORONARY ARTERY BYPASS GRAFTING x 4 USING LEFT INTERNAL MAMMARY ARTERY AND RIGHT GREATER SAPHENOUS VEIN HARVESTED ENDOSCOPICALLY -LIMA to LAD -SVG to PDA -SEQ SVG to RAMUS 1 and RAMUS 2 (N/A) TRANSESOPHAGEAL ECHOCARDIOGRAM (TEE) (N/A) Subjective: Feels better today  Objective: Vital signs in last 24 hours: Temp:  [97.5 F (36.4 C)-98.6 F (37 C)] 98.1 F (36.7 C) (10/19 0400) Pulse Rate:  [79-108] 93 (10/19 0700) Cardiac Rhythm: Normal sinus rhythm (10/18 2000) Resp:  [0-33] 19 (10/19 0700) BP: (95-137)/(37-105) 121/62 (10/19 0700) SpO2:  [77 %-100 %] 100 % (10/19 0700) Arterial Line BP: (47-94)/(32-53) 71/44 (10/19 0500) FiO2 (%):  [40 %] 40 % (10/18 0825) Weight:  [149 lb 11.1 oz (67.9 kg)] 149 lb 11.1 oz (67.9 kg) (10/19 0500)  Hemodynamic parameters for last 24 hours: PAP: (29-37)/(13-18) 37/15  Intake/Output from previous day: 10/18 0701 - 10/19 0700 In: 2775.3 [P.O.:240; I.V.:1145.3; Blood:990; IV Piggyback:400] Out: 1860 [Urine:1380; Chest Tube:480] Intake/Output this shift: No intake/output data recorded.  General appearance: alert, cooperative and no distress Neurologic: intact Heart: regular rate and rhythm Lungs: diminished breath sounds bilaterally and no wheezing Abdomen: normal findings: soft, non-tender Extremities: well perfused  Lab Results:  Recent Labs  06/07/17 1616 06/08/17 0345  WBC 11.1* 8.0  HGB 6.6* 8.6*  HCT 19.1* 25.7*  PLT 82* 49*   BMET:  Recent Labs  06/07/17 0313 06/07/17 1616 06/08/17 0345  NA 131*  --  131*  K 5.0  --  4.8  CL 98*  --  96*  CO2 27  --  28  GLUCOSE 107*  --  112*  BUN 16  --  17  CREATININE 0.82 0.97 0.94  CALCIUM 7.7*  --  8.1*    PT/INR:  Recent Labs  06/06/17 2337  LABPROT 17.3*  INR 1.43   ABG    Component Value Date/Time   PHART 7.333 (L) 06/07/2017 0131   HCO3 28.5 (H) 06/07/2017 0131   TCO2 30 06/07/2017 0131   O2SAT 98.0 06/07/2017 0131   CBG (last  3)   Recent Labs  06/07/17 1959 06/07/17 2250 06/08/17 0351  GLUCAP 113* 103* 115*    Assessment/Plan: S/P Procedure(s) (LRB): CORONARY ARTERY BYPASS GRAFTING x 4 USING LEFT INTERNAL MAMMARY ARTERY AND RIGHT GREATER SAPHENOUS VEIN HARVESTED ENDOSCOPICALLY -LIMA to LAD -SVG to PDA -SEQ SVG to RAMUS 1 and RAMUS 2 (N/A) TRANSESOPHAGEAL ECHOCARDIOGRAM (TEE) (N/A) -POD # 2 CV- stable in SR, continue low dose beta blocker, statin, ASA  RESP- Ct drainage down- dc chest tubes  Lungs sound clear this AM- will continue nebs, prednisone  Continue IS  RENAL- creatinine and lytes OK  IV lasix today  ENDO- CBG well controlled- dc CBG/ SSI  HEME- anemia secondary to ABL- better post transfusion  thrombocytopenia- platelets down from 82 to 49K  Will check HIT  Not receiving any heparin or lovenox  Ambulate   LOS: 10 days    Loreli SlotSteven C Declin Rajan 06/08/2017

## 2017-06-08 NOTE — Progress Notes (Signed)
RN removed patients 2 Mediastinal chest tubes this morning 0950 per MD order. Pt chest tube sites required steri strips to close incisions. Site dressed per protocol.  1640- Pt placed back in bed, chest tube dressings saturated from incision drainage. Site cleansed and dressed per protocol at this time.

## 2017-06-09 ENCOUNTER — Inpatient Hospital Stay (HOSPITAL_COMMUNITY): Payer: Medicare Other

## 2017-06-09 LAB — BASIC METABOLIC PANEL
Anion gap: 7 (ref 5–15)
BUN: 28 mg/dL — AB (ref 6–20)
CALCIUM: 8.1 mg/dL — AB (ref 8.9–10.3)
CHLORIDE: 98 mmol/L — AB (ref 101–111)
CO2: 29 mmol/L (ref 22–32)
CREATININE: 1.25 mg/dL — AB (ref 0.44–1.00)
GFR calc non Af Amer: 44 mL/min — ABNORMAL LOW (ref >60)
GFR, EST AFRICAN AMERICAN: 51 mL/min — AB (ref >60)
GLUCOSE: 149 mg/dL — AB (ref 65–99)
Potassium: 4.4 mmol/L (ref 3.5–5.1)
Sodium: 134 mmol/L — ABNORMAL LOW (ref 135–145)

## 2017-06-09 LAB — CBC
HCT: 25.8 % — ABNORMAL LOW (ref 36.0–46.0)
Hemoglobin: 8.4 g/dL — ABNORMAL LOW (ref 12.0–15.0)
MCH: 29.7 pg (ref 26.0–34.0)
MCHC: 32.6 g/dL (ref 30.0–36.0)
MCV: 91.2 fL (ref 78.0–100.0)
PLATELETS: 82 10*3/uL — AB (ref 150–400)
RBC: 2.83 MIL/uL — ABNORMAL LOW (ref 3.87–5.11)
RDW: 15.1 % (ref 11.5–15.5)
WBC: 12.9 10*3/uL — ABNORMAL HIGH (ref 4.0–10.5)

## 2017-06-09 LAB — HEPARIN INDUCED PLATELET AB (HIT ANTIBODY): Heparin Induced Plt Ab: 2.508 OD — ABNORMAL HIGH (ref 0.000–0.400)

## 2017-06-09 MED ORDER — POTASSIUM CHLORIDE CRYS ER 20 MEQ PO TBCR
20.0000 meq | EXTENDED_RELEASE_TABLET | Freq: Every day | ORAL | Status: DC
  Administered 2017-06-09 – 2017-06-10 (×2): 20 meq via ORAL
  Filled 2017-06-09: qty 1

## 2017-06-09 MED ORDER — AMIODARONE HCL IN DEXTROSE 360-4.14 MG/200ML-% IV SOLN
INTRAVENOUS | Status: AC
  Administered 2017-06-09: 60 mg/h via INTRAVENOUS
  Filled 2017-06-09: qty 200

## 2017-06-09 MED ORDER — TRAMADOL HCL 50 MG PO TABS
50.0000 mg | ORAL_TABLET | ORAL | Status: DC | PRN
  Administered 2017-06-09 – 2017-06-14 (×7): 50 mg via ORAL
  Filled 2017-06-09 (×8): qty 1

## 2017-06-09 MED ORDER — AMIODARONE HCL IN DEXTROSE 360-4.14 MG/200ML-% IV SOLN
60.0000 mg/h | INTRAVENOUS | Status: AC
  Administered 2017-06-09 (×2): 60 mg/h via INTRAVENOUS

## 2017-06-09 MED ORDER — METOPROLOL TARTRATE 25 MG/10 ML ORAL SUSPENSION: 25.0000 mg | Freq: Two times a day (BID) | ORAL | Status: DC

## 2017-06-09 MED ORDER — ALPRAZOLAM 0.25 MG PO TABS
0.2500 mg | ORAL_TABLET | Freq: Three times a day (TID) | ORAL | Status: DC | PRN
  Administered 2017-06-10: 0.25 mg via ORAL
  Filled 2017-06-09: qty 1

## 2017-06-09 MED ORDER — METOPROLOL TARTRATE 25 MG PO TABS
25.0000 mg | ORAL_TABLET | Freq: Two times a day (BID) | ORAL | Status: DC
  Administered 2017-06-09 – 2017-06-14 (×10): 25 mg via ORAL
  Filled 2017-06-09 (×10): qty 1

## 2017-06-09 MED ORDER — AMIODARONE LOAD VIA INFUSION
150.0000 mg | Freq: Once | INTRAVENOUS | Status: AC
  Administered 2017-06-09: 150 mg via INTRAVENOUS
  Filled 2017-06-09: qty 83.34

## 2017-06-09 MED ORDER — AMIODARONE HCL IN DEXTROSE 360-4.14 MG/200ML-% IV SOLN
30.0000 mg/h | INTRAVENOUS | Status: DC
  Administered 2017-06-10: 30 mg/h via INTRAVENOUS
  Filled 2017-06-09 (×2): qty 200

## 2017-06-09 NOTE — Progress Notes (Signed)
3 Days Post-Op Procedure(s) (LRB): CORONARY ARTERY BYPASS GRAFTING x 4 USING LEFT INTERNAL MAMMARY ARTERY AND RIGHT GREATER SAPHENOUS VEIN HARVESTED ENDOSCOPICALLY -LIMA to LAD -SVG to PDA -SEQ SVG to RAMUS 1 and RAMUS 2 (N/A) TRANSESOPHAGEAL ECHOCARDIOGRAM (TEE) (N/A) Subjective:  No complaints  Objective: Vital signs in last 24 hours: Temp:  [97.4 F (36.3 C)-98.5 F (36.9 C)] 98 F (36.7 C) (10/20 1144) Pulse Rate:  [75-95] 83 (10/20 1100) Cardiac Rhythm: Normal sinus rhythm (10/20 1100) Resp:  [11-29] 11 (10/20 1100) BP: (106-155)/(35-104) 118/52 (10/20 1100) SpO2:  [89 %-100 %] 98 % (10/20 1100) Weight:  [66.6 kg (146 lb 12.8 oz)] 66.6 kg (146 lb 12.8 oz) (10/20 0500)  Hemodynamic parameters for last 24 hours:    Intake/Output from previous day: 10/19 0701 - 10/20 0700 In: 320 [P.O.:240; I.V.:80] Out: 1530 [Urine:1490; Chest Tube:40] Intake/Output this shift: Total I/O In: -  Out: 400 [Urine:400]  General appearance: sleepy but appropriate Heart: regular rate and rhythm, S1, S2 normal, no murmur, click, rub or gallop Lungs: clear to auscultation bilaterally Extremities: edema mild Wound: incisions ok  Lab Results:  Recent Labs  06/08/17 0345 06/09/17 0609  WBC 8.0 12.9*  HGB 8.6* 8.4*  HCT 25.7* 25.8*  PLT 49* 82*   BMET:  Recent Labs  06/08/17 0345 06/09/17 0312  NA 131* 134*  K 4.8 4.4  CL 96* 98*  CO2 28 29  GLUCOSE 112* 149*  BUN 17 28*  CREATININE 0.94 1.25*  CALCIUM 8.1* 8.1*    PT/INR:  Recent Labs  06/06/17 2337  LABPROT 17.3*  INR 1.43   ABG    Component Value Date/Time   PHART 7.336 (L) 06/07/2017 0822   HCO3 29.1 (H) 06/07/2017 0822   TCO2 31 06/07/2017 0822   ACIDBASEDEF 1.0 06/07/2017 0445   O2SAT 100.0 06/07/2017 0822   CBG (last 3)   Recent Labs  06/07/17 1959 06/07/17 2250 06/08/17 0351  GLUCAP 113* 103* 115*   CLINICAL DATA:  Status post CABG.  EXAM: PORTABLE CHEST 1 VIEW  COMPARISON:  06/08/2017 and  prior radiographs  FINDINGS: Cardiomegaly and CABG changes again noted.  Mediastinal/thoracostomy tubes and a right IJ central venous catheter sheath have been removed.  A miniscule left apical pneumothorax is unchanged.  Bibasilar atelectasis, right greater than left and possible trace bilateral pleural effusions noted.  No other significant change.  IMPRESSION: Support apparatus removal with unchanged miniscule left apical pneumothorax.  Bibasilar atelectasis and possible trace effusion again noted.   Electronically Signed   By: Harmon PierJeffrey  Hu M.D.   On: 06/09/2017 07:20  Assessment/Plan: S/P Procedure(s) (LRB): CORONARY ARTERY BYPASS GRAFTING x 4 USING LEFT INTERNAL MAMMARY ARTERY AND RIGHT GREATER SAPHENOUS VEIN HARVESTED ENDOSCOPICALLY -LIMA to LAD -SVG to PDA -SEQ SVG to RAMUS 1 and RAMUS 2 (N/A) TRANSESOPHAGEAL ECHOCARDIOGRAM (TEE) (N/A)  POD 3 She is hemodynamically stable in sinus rhythm. HR 100-105 so will increase Lopressor to 25 bid.  Volume excess: continue diuretic and add KCL  Thrombocytopenia: HIT pending but platelet count now increasing.  COPD: stable. Continue IS, ambulation   LOS: 11 days    Alleen BorneBryan K Bartle 06/09/2017

## 2017-06-09 NOTE — Plan of Care (Signed)
Problem: Activity: Goal: Risk for activity intolerance will decrease Outcome: Progressing Pt continues to ambulate in hallways without difficulty.   Problem: Bowel/Gastric: Goal: Gastrointestinal status for postoperative course will improve Outcome: Not Progressing Pt has not had a bowel movement post op and states that she is not passing gas. Pt does have active bowel sounds.

## 2017-06-09 NOTE — Progress Notes (Signed)
Patient ID: Samantha Dyer, female   DOB: 03-Sep-1950, 66 y.o.   MRN: [id number] SICU Evening Rounds:  Samantha FlesherWent into atrial fib with RVR this pm and started on IV amiodarone. Still atrial fib but rate down to 110-115.  Heparin induced platelet antibody elevated at 2.5 but platelets are increasing so will hold off on anticoagulation for now. No signs of thrombosis.  Urine output good.

## 2017-06-10 ENCOUNTER — Encounter (HOSPITAL_COMMUNITY): Payer: Self-pay

## 2017-06-10 LAB — BASIC METABOLIC PANEL
ANION GAP: 7 (ref 5–15)
BUN: 19 mg/dL (ref 6–20)
CALCIUM: 8.7 mg/dL — AB (ref 8.9–10.3)
CO2: 34 mmol/L — ABNORMAL HIGH (ref 22–32)
Chloride: 91 mmol/L — ABNORMAL LOW (ref 101–111)
Creatinine, Ser: 1.1 mg/dL — ABNORMAL HIGH (ref 0.44–1.00)
GFR calc Af Amer: 60 mL/min — ABNORMAL LOW (ref >60)
GFR, EST NON AFRICAN AMERICAN: 52 mL/min — AB (ref >60)
GLUCOSE: 100 mg/dL — AB (ref 65–99)
Potassium: 4.2 mmol/L (ref 3.5–5.1)
SODIUM: 132 mmol/L — AB (ref 135–145)

## 2017-06-10 LAB — CBC
HCT: 24 % — ABNORMAL LOW (ref 36.0–46.0)
Hemoglobin: 7.8 g/dL — ABNORMAL LOW (ref 12.0–15.0)
MCH: 30.2 pg (ref 26.0–34.0)
MCHC: 32.5 g/dL (ref 30.0–36.0)
MCV: 93 fL (ref 78.0–100.0)
PLATELETS: 83 10*3/uL — AB (ref 150–400)
RBC: 2.58 MIL/uL — ABNORMAL LOW (ref 3.87–5.11)
RDW: 14.9 % (ref 11.5–15.5)
WBC: 12 10*3/uL — AB (ref 4.0–10.5)

## 2017-06-10 MED ORDER — PREDNISONE 10 MG PO TABS
10.0000 mg | ORAL_TABLET | Freq: Every day | ORAL | Status: DC
  Administered 2017-06-11 – 2017-06-14 (×4): 10 mg via ORAL
  Filled 2017-06-10 (×4): qty 1

## 2017-06-10 MED ORDER — POTASSIUM CHLORIDE CRYS ER 20 MEQ PO TBCR
20.0000 meq | EXTENDED_RELEASE_TABLET | Freq: Two times a day (BID) | ORAL | Status: DC
  Administered 2017-06-10 – 2017-06-14 (×8): 20 meq via ORAL
  Filled 2017-06-10 (×8): qty 1

## 2017-06-10 MED ORDER — FUROSEMIDE 10 MG/ML IJ SOLN
40.0000 mg | Freq: Two times a day (BID) | INTRAMUSCULAR | Status: DC
  Administered 2017-06-10: 40 mg via INTRAVENOUS
  Filled 2017-06-10: qty 4

## 2017-06-10 MED ORDER — AMIODARONE HCL 200 MG PO TABS
400.0000 mg | ORAL_TABLET | Freq: Two times a day (BID) | ORAL | Status: DC
  Administered 2017-06-10 – 2017-06-14 (×9): 400 mg via ORAL
  Filled 2017-06-10 (×9): qty 2

## 2017-06-10 MED ORDER — METOLAZONE 5 MG PO TABS
5.0000 mg | ORAL_TABLET | Freq: Once | ORAL | Status: AC
  Administered 2017-06-10: 5 mg via ORAL
  Filled 2017-06-10: qty 1

## 2017-06-10 MED ORDER — FERROUS GLUCONATE 324 (38 FE) MG PO TABS
324.0000 mg | ORAL_TABLET | Freq: Two times a day (BID) | ORAL | Status: DC
  Administered 2017-06-10 – 2017-06-14 (×8): 324 mg via ORAL
  Filled 2017-06-10 (×8): qty 1

## 2017-06-10 NOTE — Progress Notes (Signed)
4 Days Post-Op Procedure(s) (LRB): CORONARY ARTERY BYPASS GRAFTING x 4 USING LEFT INTERNAL MAMMARY ARTERY AND RIGHT GREATER SAPHENOUS VEIN HARVESTED ENDOSCOPICALLY -LIMA to LAD -SVG to PDA -SEQ SVG to RAMUS 1 and RAMUS 2 (N/A) TRANSESOPHAGEAL ECHOCARDIOGRAM (TEE) (N/A) Subjective:  Feels better. Ambulated some.  Objective: Vital signs in last 24 hours: Temp:  [97.6 F (36.4 C)-98.1 F (36.7 C)] 97.6 F (36.4 C) (10/21 0806) Pulse Rate:  [62-111] 71 (10/21 0800) Cardiac Rhythm: Normal sinus rhythm (10/21 0800) Resp:  [10-22] 13 (10/21 0700) BP: (100-144)/(42-64) 130/46 (10/21 0800) SpO2:  [92 %-100 %] 93 % (10/21 0800) Weight:  [32.1 kg (70 lb 11.2 oz)] 32.1 kg (70 lb 11.2 oz) (10/21 0427)  Hemodynamic parameters for last 24 hours:    Intake/Output from previous day: 10/20 0701 - 10/21 0700 In: 1304.6 [P.O.:660; I.V.:644.6] Out: 1500 [Urine:1500] Intake/Output this shift: Total I/O In: 33.4 [I.V.:33.4] Out: -   General appearance: alert and cooperative Neurologic: intact Heart: regular rate and rhythm, S1, S2 normal, no murmur, click, rub or gallop Lungs: diminished breath sounds bibasilar Extremities: edema moderate in legs Wound: incisions ok  Lab Results:  Recent Labs  06/09/17 0609 06/10/17 0530  WBC 12.9* 12.0*  HGB 8.4* 7.8*  HCT 25.8* 24.0*  PLT 82* 83*   BMET:  Recent Labs  06/09/17 0312 06/10/17 0530  NA 134* 132*  K 4.4 4.2  CL 98* 91*  CO2 29 34*  GLUCOSE 149* 100*  BUN 28* 19  CREATININE 1.25* 1.10*  CALCIUM 8.1* 8.7*    PT/INR: No results for input(s): LABPROT, INR in the last 72 hours. ABG    Component Value Date/Time   PHART 7.336 (L) 06/07/2017 0822   HCO3 29.1 (H) 06/07/2017 0822   TCO2 31 06/07/2017 0822   ACIDBASEDEF 1.0 06/07/2017 0445   O2SAT 100.0 06/07/2017 0822   CBG (last 3)   Recent Labs  06/07/17 1959 06/07/17 2250 06/08/17 0351  GLUCAP 113* 103* 115*    Assessment/Plan: S/P Procedure(s) (LRB): CORONARY  ARTERY BYPASS GRAFTING x 4 USING LEFT INTERNAL MAMMARY ARTERY AND RIGHT GREATER SAPHENOUS VEIN HARVESTED ENDOSCOPICALLY -LIMA to LAD -SVG to PDA -SEQ SVG to RAMUS 1 and RAMUS 2 (N/A) TRANSESOPHAGEAL ECHOCARDIOGRAM (TEE) (N/A)  POD 4 Hemodynamically stable this am.  Postop atrial fibrillation yesterday converted with IV amio. Will switch to po.  Moderate volume excess particularly in legs. Will continue diuresis and add Metolazone. She sits up in chair most of the time which is probably why edema in legs predominantly.  Thrombocytopenia: HI paltelet antibody positive. Plts have risen and stable at 882 K with no sign of thrombosis so will hold off on anticoagulation and observe.  Expected acute postop blood loss anemia: a little lower today so will check in the am. No visible blood loss. Start Fe2+.  COPD on home oxygen: Put on prednisone at some point recently for exacerbation. Will decrease to 10 mg. Continue IS, ambulation.   LOS: 12 days    Samantha Dyer 06/10/2017

## 2017-06-11 ENCOUNTER — Inpatient Hospital Stay (HOSPITAL_COMMUNITY): Payer: Medicare Other

## 2017-06-11 LAB — BASIC METABOLIC PANEL
Anion gap: 3 — ABNORMAL LOW (ref 5–15)
BUN: 21 mg/dL — AB (ref 6–20)
CO2: 38 mmol/L — ABNORMAL HIGH (ref 22–32)
Calcium: 8.3 mg/dL — ABNORMAL LOW (ref 8.9–10.3)
Chloride: 88 mmol/L — ABNORMAL LOW (ref 101–111)
Creatinine, Ser: 1.15 mg/dL — ABNORMAL HIGH (ref 0.44–1.00)
GFR calc Af Amer: 57 mL/min — ABNORMAL LOW (ref >60)
GFR, EST NON AFRICAN AMERICAN: 49 mL/min — AB (ref >60)
GLUCOSE: 83 mg/dL (ref 65–99)
POTASSIUM: 4.3 mmol/L (ref 3.5–5.1)
Sodium: 129 mmol/L — ABNORMAL LOW (ref 135–145)

## 2017-06-11 LAB — GLUCOSE, CAPILLARY: Glucose-Capillary: 77 mg/dL (ref 65–99)

## 2017-06-11 LAB — CBC
HEMATOCRIT: 24.1 % — AB (ref 36.0–46.0)
Hemoglobin: 7.8 g/dL — ABNORMAL LOW (ref 12.0–15.0)
MCH: 30.5 pg (ref 26.0–34.0)
MCHC: 32.4 g/dL (ref 30.0–36.0)
MCV: 94.1 fL (ref 78.0–100.0)
Platelets: 99 10*3/uL — ABNORMAL LOW (ref 150–400)
RBC: 2.56 MIL/uL — ABNORMAL LOW (ref 3.87–5.11)
RDW: 14.5 % (ref 11.5–15.5)
WBC: 13.3 10*3/uL — ABNORMAL HIGH (ref 4.0–10.5)

## 2017-06-11 MED ORDER — FUROSEMIDE 40 MG PO TABS
40.0000 mg | ORAL_TABLET | Freq: Two times a day (BID) | ORAL | Status: DC
  Administered 2017-06-11 – 2017-06-14 (×7): 40 mg via ORAL
  Filled 2017-06-11 (×7): qty 1

## 2017-06-11 MED ORDER — LEVALBUTEROL HCL 0.63 MG/3ML IN NEBU
0.6300 mg | INHALATION_SOLUTION | Freq: Three times a day (TID) | RESPIRATORY_TRACT | Status: DC
  Administered 2017-06-12 – 2017-06-13 (×6): 0.63 mg via RESPIRATORY_TRACT
  Filled 2017-06-11 (×10): qty 3

## 2017-06-11 NOTE — Progress Notes (Signed)
Pt made aware of new room 4E15. Pt will let husband know. Called unit and report given to Lauren RN on 4E.

## 2017-06-11 NOTE — Progress Notes (Signed)
5 Days Post-Op Procedure(s) (LRB): CORONARY ARTERY BYPASS GRAFTING x 4 USING LEFT INTERNAL MAMMARY ARTERY AND RIGHT GREATER SAPHENOUS VEIN HARVESTED ENDOSCOPICALLY -LIMA to LAD -SVG to PDA -SEQ SVG to RAMUS 1 and RAMUS 2 (N/A) TRANSESOPHAGEAL ECHOCARDIOGRAM (TEE) (N/A) Subjective: Still has some swelling. Pain is better  Objective: Vital signs in last 24 hours: Temp:  [97.4 F (36.3 C)-98.6 F (37 C)] 97.8 F (36.6 C) (10/22 0347) Pulse Rate:  [61-87] 65 (10/22 0620) Cardiac Rhythm: Normal sinus rhythm (10/22 0400) BP: (103-134)/(42-60) 124/45 (10/22 0600) SpO2:  [93 %-100 %] 100 % (10/22 0620) Weight:  [145 lb 15.1 oz (66.2 kg)] 145 lb 15.1 oz (66.2 kg) (10/22 0500)  Hemodynamic parameters for last 24 hours:    Intake/Output from previous day: 10/21 0701 - 10/22 0700 In: 783.2 [P.O.:680; I.V.:103.2] Out: 1650 [Urine:1650] Intake/Output this shift: No intake/output data recorded.  General appearance: alert, cooperative and no distress Neurologic: intact Heart: regular rate and rhythm Lungs: diminished breath sounds bibasilar Abdomen: normal findings: soft, non-tender Extremities: edema 2+  Lab Results:  Recent Labs  06/10/17 0530 06/11/17 0540  WBC 12.0* 13.3*  HGB 7.8* 7.8*  HCT 24.0* 24.1*  PLT 83* 99*   BMET:  Recent Labs  06/10/17 0530 06/11/17 0540  NA 132* 129*  K 4.2 4.3  CL 91* 88*  CO2 34* 38*  GLUCOSE 100* 83  BUN 19 21*  CREATININE 1.10* 1.15*  CALCIUM 8.7* 8.3*    PT/INR: No results for input(s): LABPROT, INR in the last 72 hours. ABG    Component Value Date/Time   PHART 7.336 (L) 06/07/2017 0822   HCO3 29.1 (H) 06/07/2017 0822   TCO2 31 06/07/2017 0822   ACIDBASEDEF 1.0 06/07/2017 0445   O2SAT 100.0 06/07/2017 0822   CBG (last 3)  No results for input(s): GLUCAP in the last 72 hours.  Assessment/Plan: S/P Procedure(s) (LRB): CORONARY ARTERY BYPASS GRAFTING x 4 USING LEFT INTERNAL MAMMARY ARTERY AND RIGHT GREATER SAPHENOUS VEIN  HARVESTED ENDOSCOPICALLY -LIMA to LAD -SVG to PDA -SEQ SVG to RAMUS 1 and RAMUS 2 (N/A) TRANSESOPHAGEAL ECHOCARDIOGRAM (TEE) (N/A) Plan for transfer to step-down: see transfer orders  CV- maintaining SR on lopressor and amiodarone  RESP- bibasilar atelectasis, bilateral effusions   Continue IS, nebs, diuresis  Prednisone 10 mg daily  RENAL- creatinine stable  Change to PO lasix  ENDO- glucose Ok  Continue ambulation  transfer to 4E   LOS: 13 days    Loreli SlotSteven C Rashid Whitenight 06/11/2017

## 2017-06-11 NOTE — Progress Notes (Signed)
Nutrition Follow-up  INTERVENTION:  Reviewed high protein foods Discussed importance of adequate nutrition after discharge.   NUTRITION DIAGNOSIS:   Inadequate oral intake related to inability to eat as evidenced by NPO status. Resolved.   GOAL:   Patient will meet greater than or equal to 90% of their needs Met.   MONITOR:   PO intake, Labs, Weight trends  ASSESSMENT:   66 yo female with PMH of COPD gold IV and diastolic heart failure who was admitted on 10/8 with respiratory failure requiring intubation.  Chart reviewed, per meal completion documentation po intake >75% Per pt and husband pt is eating well. Family does often bring in food from home. Pt states that she eats "weird" and does not eat on the same schedule that we serve meals. She has snacks at her bedside.  We reviewed high protein foods and snacks. Discussed importance of adequate nutrition for home. Pt not engaged in conversation. Husband did ask questions.  Medications reviewed and include: colace, dulcolax, ferrous gluconate, lasix, K-dur, prednisone Labs reviewed: Na 129 (L) CBG's: 77   Diet Order:  Diet Heart Room service appropriate? Yes; Fluid consistency: Thin  Skin:  Reviewed, no issues  Last BM:  PTA  Height:   Ht Readings from Last 1 Encounters:  05/29/17 '5\' 4"'$  (1.626 m)    Weight:   Wt Readings from Last 1 Encounters:  06/11/17 145 lb 15.1 oz (66.2 kg)    Ideal Body Weight:  54.5 kg  BMI:  Body mass index is 25.05 kg/m.  Estimated Nutritional Needs:   Kcal:  1600-1800 kcals  Protein:  80-90 g   Fluid:  >/= 1.6 L  EDUCATION NEEDS:   No education needs identified at this time  [place], [place], [place] Pager [phone] After Hours Pager

## 2017-06-11 NOTE — Progress Notes (Signed)
Pt arrived to 4e from 2h. Vitals obtained. Telemetry applied. Pt oriented to room and staff. Pt denies needs at this time. Will continue current plan of care.   Berdine DanceLauren Moffitt BSN, RN

## 2017-06-12 ENCOUNTER — Inpatient Hospital Stay (HOSPITAL_COMMUNITY): Payer: Medicare Other

## 2017-06-12 LAB — BASIC METABOLIC PANEL
ANION GAP: 8 (ref 5–15)
BUN: 24 mg/dL — ABNORMAL HIGH (ref 6–20)
CALCIUM: 8.4 mg/dL — AB (ref 8.9–10.3)
CHLORIDE: 86 mmol/L — AB (ref 101–111)
CO2: 37 mmol/L — AB (ref 22–32)
Creatinine, Ser: 1.12 mg/dL — ABNORMAL HIGH (ref 0.44–1.00)
GFR calc Af Amer: 58 mL/min — ABNORMAL LOW (ref >60)
GFR calc non Af Amer: 50 mL/min — ABNORMAL LOW (ref >60)
GLUCOSE: 89 mg/dL (ref 65–99)
Potassium: 3.9 mmol/L (ref 3.5–5.1)
Sodium: 131 mmol/L — ABNORMAL LOW (ref 135–145)

## 2017-06-12 LAB — CBC
HEMATOCRIT: 24.3 % — AB (ref 36.0–46.0)
HEMOGLOBIN: 7.9 g/dL — AB (ref 12.0–15.0)
MCH: 30.3 pg (ref 26.0–34.0)
MCHC: 32.5 g/dL (ref 30.0–36.0)
MCV: 93.1 fL (ref 78.0–100.0)
Platelets: 127 10*3/uL — ABNORMAL LOW (ref 150–400)
RBC: 2.61 MIL/uL — ABNORMAL LOW (ref 3.87–5.11)
RDW: 14.2 % (ref 11.5–15.5)
WBC: 13.7 10*3/uL — ABNORMAL HIGH (ref 4.0–10.5)

## 2017-06-12 NOTE — Progress Notes (Signed)
Notified Dr. Cornelius Moraswen of patient converting to afib 90-105.  Patient is asymptomatic, bp documented.

## 2017-06-12 NOTE — Progress Notes (Addendum)
      [address]       [place] [id number]             [phone]      6 Days Post-Op Procedure(s) (LRB): CORONARY ARTERY BYPASS GRAFTING x 4 USING LEFT INTERNAL MAMMARY ARTERY AND RIGHT GREATER SAPHENOUS VEIN HARVESTED ENDOSCOPICALLY -LIMA to LAD -SVG to PDA -SEQ SVG to RAMUS 1 and RAMUS 2 (N/A) TRANSESOPHAGEAL ECHOCARDIOGRAM (TEE) (N/A)   Subjective:  No new complaints.  States she feels better once she gets up and moving.  Remains on oxygen which she uses at home.  No BM  Objective: Vital signs in last 24 hours: Temp:  [98 F (36.7 C)-98.2 F (36.8 C)] 98 F (36.7 C) (10/23 0534) Pulse Rate:  [66-94] 73 (10/22 2108) Cardiac Rhythm: Normal sinus rhythm;Bundle branch block (10/23 0700) Resp:  [14-18] 15 (10/23 0500) BP: (109-136)/(42-77) 112/42 (10/22 2108) SpO2:  [99 %-100 %] 100 % (10/23 0500) Weight:  [143 lb 12.8 oz (65.2 kg)] 143 lb 12.8 oz (65.2 kg) (10/23 0500)  Intake/Output from previous day: 10/22 0701 - 10/23 0700 In: 483 [P.O.:480; I.V.:3] Out: 750 [Urine:750]  General appearance: alert, cooperative and no distress Heart: regular rate and rhythm Lungs: diminished breath sounds bibasilar Abdomen: soft, non-tender; bowel sounds normal; no masses,  no organomegaly Extremities: edema edema pitting 2+ R>L Wound: clean and dry  Lab Results:  Recent Labs  06/11/17 0540 06/12/17 0205  WBC 13.3* 13.7*  HGB 7.8* 7.9*  HCT 24.1* 24.3*  PLT 99* 127*   BMET:  Recent Labs  06/11/17 0540 06/12/17 0205  NA 129* 131*  K 4.3 3.9  CL 88* 86*  CO2 38* 37*  GLUCOSE 83 89  BUN 21* 24*  CREATININE 1.15* 1.12*  CALCIUM 8.3* 8.4*    PT/INR: No results for input(s): LABPROT, INR in the last 72 hours. ABG    Component Value Date/Time   PHART 7.336 (L) 06/07/2017 0822   HCO3 29.1 (H) 06/07/2017 0822   TCO2 31 06/07/2017 0822   ACIDBASEDEF 1.0 06/07/2017 0445   O2SAT 100.0 06/07/2017 0822   CBG (last 3)   Recent Labs  06/11/17 0834    GLUCAP 77    Assessment/Plan: S/P Procedure(s) (LRB): CORONARY ARTERY BYPASS GRAFTING x 4 USING LEFT INTERNAL MAMMARY ARTERY AND RIGHT GREATER SAPHENOUS VEIN HARVESTED ENDOSCOPICALLY -LIMA to LAD -SVG to PDA -SEQ SVG to RAMUS 1 and RAMUS 2 (N/A) TRANSESOPHAGEAL ECHOCARDIOGRAM (TEE) (N/A)  1. CV- NSR, some brief SVT overnight- continue Amiodarone, Lopressor 2. Pulm- severe COPD, on oxygen chronically at home, continue steroids 3. Renal-  Creatinine stable, weight is below admission, trending down, remains on Lasix BID 4. Expected post operative anemia- Hgb stable 7.9 5. GI- constipation, patient refusing laxatives 6. Dispo- patient stable, continue diuretics, severe COPD on oxygen baseline, possibly ready for d/c later this week   LOS: 14 days    BARRETT, ERIN 06/12/2017 Patient seen and examined, agree with above Dc pacing wires CXR shows some basilar atelectasis and bilateral effusions  Viviann SpareSteven C. Dorris FetchHendrickson, MD [facility] [phone]

## 2017-06-12 NOTE — Progress Notes (Signed)
CARDIAC REHAB PHASE I   PRE:  Rate/Rhythm: 70 SR    BP: sitting 131/83    SaO2: 99 2L  MODE:  Ambulation: 470 ft   POST:  Rate/Rhythm: 80 SR    BP: sitting 125/65     SaO2: 97 2L  Pt sts she just walked recently but willing to walk again. Pt moving fairly well. Needed to rock to stand. Steady with RW. Gets more SOB with distance. Return to bed in sitting up position. Significantly SOB but SaO2 97 2L. Encouraged more walking and IS. She sts she has not done IS today.  [id number]   Harriet MassonRandi Kristan Mikayla Chiusano CES, ACSM 06/12/2017 2:43 PM

## 2017-06-12 NOTE — Care Management Important Message (Signed)
Important Message  Patient Details  Name: Samantha Dyer MRN: [id number] Date of Birth: 11-17-1950   Medicare Important Message Given:  Yes    [name] 06/12/2017, 9:30 AM

## 2017-06-12 NOTE — Progress Notes (Signed)
Epicardial pacing wires removed, per order. Wire ends intact. Sites clean and dry. Gauze dressing applied. Vitals stable and being monitored every 15 minutes. Bedrest until 12:15 pm. Pt in bed with call light within reach. Will continue to monitor.   Berdine DanceLauren Moffitt BSN, RN

## 2017-06-12 NOTE — Care Management Important Message (Signed)
Important Message  Patient Details  Name: Samantha PeaKathy Valek MRN: [id number] Date of Birth: Aug 01, 1951   Medicare Important Message Given:  Yes    Trejan Buda Abena 06/12/2017, 9:22 AM

## 2017-06-13 LAB — SEROTONIN RELEASE ASSAY (SRA)
SRA, HIGH DOSE HEPARIN: 1 % (ref 0–20)
SRA, LOW DOSE HEPARIN: 1 % (ref 0–20)

## 2017-06-13 MED ORDER — TRAMADOL HCL 50 MG PO TABS: 50.0000 mg | ORAL_TABLET | ORAL | 0 refills | Status: DC | PRN

## 2017-06-13 NOTE — Progress Notes (Addendum)
      [address]       [place] [id number]             [phone]      Subjective:   No new complaints.  Requests that all stool softeners and bowel agents be discontinued.  Ready to go home today.  Objective:  Vital Signs in the last 24 hours: Temp:  [97.5 F (36.4 C)-98.4 F (36.9 C)] 97.5 F (36.4 C) (10/24 0430) Pulse Rate:  [67-105] 70 (10/23 1145) Resp:  [13-33] 33 (10/24 0430) BP: (109-127)/(46-85) 127/70 (10/24 0430) SpO2:  [96 %-100 %] 99 % (10/24 0430) Weight:  [141 lb 9.6 oz (64.2 kg)] 141 lb 9.6 oz (64.2 kg) (10/24 0430)  Intake/Output from previous day: 10/23 0701 - 10/24 0700 In: 480 [P.O.:480] Out: -  Intake/Output from this shift: No intake/output data recorded.  Physical Exam: General appearance: alert, cooperative and no distress Lungs: diminished breath sounds bilaterally Heart: regular rate and rhythm Abdomen: soft, non-tender; bowel sounds normal; no masses,  no organomegaly Extremities: edema 1-2+ pitting bilaterally R>L Incision: healing without infection, extensive ecchymosis of RLE, sternotomy, chest tube site  Lab Results:  Recent Labs  06/11/17 0540 06/12/17 0205  WBC 13.3* 13.7*  HGB 7.8* 7.9*  PLT 99* 127*    Recent Labs  06/11/17 0540 06/12/17 0205  NA 129* 131*  K 4.3 3.9  CL 88* 86*  CO2 38* 37*  GLUCOSE 83 89  BUN 21* 24*  CREATININE 1.15* 1.12*   Assessment/Plan:   S/P CABG, POD #7  1. CV- PAF, overnight rate controlled- on Amiodarone 400 mg BID, Lopressor 25 mg BID--- patient may need anticoagulation at discharge will discuss with staff need and if patient is a candidate 2. Pulm- severe COPD, oxygen dependent at home, continue IS 3. Renal- creatinine stable, weight is trending down, continued LE edema... Will discuss lasix regimen for discharge with Dr. Dorris FetchHendrickson 4. Gi- constipation resolved, will stop all stool softeners per patient request 5. Dispo- patient stable, continue diuretics, will  discuss need for anticoagulation with staff, on oxygen at home this remains unchanged, patient wants to go home today, however with PAF overnight should be observed for 24 hours prior to discharge... Will defer to Dr. Dorris FetchHendrickson    LOS: 15 days    Lowella DandyBARRETT, ERIN 06/13/2017, 8:05 AM  Patient seen and examined, agree with above If no further AF home in AM  HoffmanSteven C. Dorris FetchHendrickson, MD [facility] [phone]

## 2017-06-13 NOTE — Progress Notes (Signed)
CARDIAC REHAB PHASE I   PRE:  Rate/Rhythm: 68 SR  BP:  Supine: 121/45  Sitting:   Standing:    SaO2: 98% 2L  MODE:  Ambulation: 470 ft   POST:  Rate/Rhythm: 76 SR  BP:  Supine:   Sitting: 135/53  Standing:    SaO2: 99% 2L 1347-1437 Pt walked 470 ft on 2L with hand held asst. Pt wanted to try without rolling walker. Pt stopped a couple of times to rest. Encouraged pursed lip breathing. Pt would like rolling walker for home use as she stated a little less SOB when using it. Also would [facility] for [facility] use as pt stated she has difficulty getting up off toilet without use of arms. To sitting on side of bed after walk.  Discussed sternal precautions, IS, ex ed, heart healthy diet and CRP 2. Also discussed smoking cessation and gave fake cigarette. Gave smoking cessation handout. Referring to [place] Phase 2. Wrote down how to view discharge video.   Luetta Nuttingharlene Zelma Mazariego, RN BSN  06/13/2017 2:33 PM

## 2017-06-13 NOTE — Progress Notes (Signed)
Chart reviewed for LOS; B Jaymen Fetch RN,MHA,BSN [phone] 

## 2017-06-14 ENCOUNTER — Encounter: Payer: Self-pay | Admitting: Adult Health

## 2017-06-14 MED ORDER — POTASSIUM CHLORIDE CRYS ER 20 MEQ PO TBCR: 20.0000 meq | EXTENDED_RELEASE_TABLET | Freq: Two times a day (BID) | ORAL | 1 refills | Status: DC

## 2017-06-14 MED ORDER — LEVALBUTEROL HCL 0.63 MG/3ML IN NEBU
0.6300 mg | INHALATION_SOLUTION | Freq: Two times a day (BID) | RESPIRATORY_TRACT | Status: DC
  Administered 2017-06-14: 0.63 mg via RESPIRATORY_TRACT
  Filled 2017-06-14: qty 3

## 2017-06-14 MED ORDER — AMIODARONE HCL 400 MG PO TABS: 400.0000 mg | ORAL_TABLET | Freq: Two times a day (BID) | ORAL | 1 refills | Status: DC

## 2017-06-14 MED ORDER — ATORVASTATIN CALCIUM 80 MG PO TABS: 80.0000 mg | ORAL_TABLET | Freq: Every day | ORAL | 3 refills | Status: DC

## 2017-06-14 MED ORDER — ASPIRIN 81 MG PO TBEC: 81.0000 mg | DELAYED_RELEASE_TABLET | Freq: Every day | ORAL | Status: AC

## 2017-06-14 MED ORDER — FERROUS GLUCONATE 324 (38 FE) MG PO TABS: 324.0000 mg | ORAL_TABLET | Freq: Two times a day (BID) | ORAL | 0 refills | Status: DC

## 2017-06-14 MED ORDER — METOPROLOL TARTRATE 25 MG PO TABS: 25.0000 mg | ORAL_TABLET | Freq: Two times a day (BID) | ORAL | 3 refills | Status: AC

## 2017-06-14 MED ORDER — FUROSEMIDE 40 MG PO TABS: 40.0000 mg | ORAL_TABLET | Freq: Two times a day (BID) | ORAL | 3 refills | Status: DC

## 2017-06-14 MED ORDER — OXYCODONE HCL 5 MG PO TABS: 5.0000 mg | ORAL_TABLET | ORAL | 0 refills | Status: DC | PRN

## 2017-06-14 NOTE — Progress Notes (Addendum)
Patient to be discharged home today; HHC ordered through [facility] as pt requested earlier but she changed her mind and  does not want any HHC services at this time and DME to be delivered to her room today prior to discharging home; Alexis GoodellB Kashten Gowin RN,MHA,BSN [phone]

## 2017-06-14 NOTE — Progress Notes (Signed)
Per case mgt, pt using assisted vent qHS and needs order for home health PT/RN for D/C.

## 2017-06-14 NOTE — Progress Notes (Signed)
      [address]       [place], [id number]             [phone]      8 Days Post-Op Procedure(s) (LRB): CORONARY ARTERY BYPASS GRAFTING x 4 USING LEFT INTERNAL MAMMARY ARTERY AND RIGHT GREATER SAPHENOUS VEIN HARVESTED ENDOSCOPICALLY -LIMA to LAD -SVG to PDA -SEQ SVG to RAMUS 1 and RAMUS 2 (N/A) TRANSESOPHAGEAL ECHOCARDIOGRAM (TEE) (N/A)   Subjective:  Patient states she wants to go home.  She states she would be more comfortable at home.   Objective: Vital signs in last 24 hours: Temp:  [97.9 F (36.6 C)-98.7 F (37.1 C)] 97.9 F (36.6 C) (10/25 0424) Pulse Rate:  [76-83] 76 (10/24 1943) Cardiac Rhythm: Normal sinus rhythm (10/24 1942) Resp:  [17-26] 22 (10/25 0424) BP: (112-128)/(44-90) 112/90 (10/25 0424) SpO2:  [97 %-100 %] 97 % (10/25 0424) Weight:  [140 lb 9.6 oz (63.8 kg)] 140 lb 9.6 oz (63.8 kg) (10/25 0424)  Intake/Output from previous day: 10/24 0701 - 10/25 0700 In: 150 [P.O.:150] Out: 4 [Urine:3; Stool:1]  General appearance: alert, cooperative and no distress Heart: regular rate and rhythm Lungs: diminished breath sounds bibasilar Abdomen: soft, non-tender; bowel sounds normal; no masses,  no organomegaly Extremities: edema 1-2+ pitting L>R Wound: clean and dry  Lab Results:  Recent Labs  06/12/17 0205  WBC 13.7*  HGB 7.9*  HCT 24.3*  PLT 127*   BMET:  Recent Labs  06/12/17 0205  NA 131*  K 3.9  CL 86*  CO2 37*  GLUCOSE 89  BUN 24*  CREATININE 1.12*  CALCIUM 8.4*    PT/INR: No results for input(s): LABPROT, INR in the last 72 hours. ABG    Component Value Date/Time   PHART 7.336 (L) 06/07/2017 0822   HCO3 29.1 (H) 06/07/2017 0822   TCO2 31 06/07/2017 0822   ACIDBASEDEF 1.0 06/07/2017 0445   O2SAT 100.0 06/07/2017 0822   CBG (last 3)   Recent Labs  06/11/17 0834  GLUCAP 77    Assessment/Plan: S/P Procedure(s) (LRB): CORONARY ARTERY BYPASS GRAFTING x 4 USING LEFT INTERNAL MAMMARY ARTERY AND RIGHT  GREATER SAPHENOUS VEIN HARVESTED ENDOSCOPICALLY -LIMA to LAD -SVG to PDA -SEQ SVG to RAMUS 1 and RAMUS 2 (N/A) TRANSESOPHAGEAL ECHOCARDIOGRAM (TEE) (N/A)  1. CV- no A fib overnight, she has been NSR with PAC- continue Lopressor, Amiodarone 2. Pulm- severe COPD, uses home oxygen at home 3. Renal- creatinine has been stable, continue Lasix 40 mg BID for next 7 days, then decrease to 40 mg daily 4. Dispo- patient stable, will d/c home today   LOS: 16 days    Samantha Dyer 06/14/2017

## 2017-06-15 ENCOUNTER — Telehealth (HOSPITAL_COMMUNITY): Payer: Self-pay

## 2017-06-15 NOTE — Telephone Encounter (Signed)
Patient insurance is active and benefits verified. Patient insurance is Medicare A/B and [employer]. Medicare A/B - no co-payment, deductible $183.00/$183.00 has been met, 20% co-insurance, no out of pocket, and no pre-authorization. Passport/reference [phone]. [employer] - no co-payment, no deductible, no out of pocket, no co-insurance and no pre-authorization. Passport/reference [phone].  Patient will be contacted and scheduled after their follow up appointment with the cardiologist on 06/29/17, upon review by [facility] RN navigator.

## 2017-06-18 ENCOUNTER — Telehealth: Payer: Self-pay | Admitting: Cardiovascular Disease

## 2017-06-18 DIAGNOSIS — I5033 Acute on chronic diastolic (congestive) heart failure: Secondary | ICD-10-CM | POA: Diagnosis not present

## 2017-06-18 DIAGNOSIS — D509 Iron deficiency anemia, unspecified: Secondary | ICD-10-CM | POA: Diagnosis not present

## 2017-06-18 DIAGNOSIS — Z951 Presence of aortocoronary bypass graft: Secondary | ICD-10-CM | POA: Diagnosis not present

## 2017-06-18 DIAGNOSIS — J9691 Respiratory failure, unspecified with hypoxia: Secondary | ICD-10-CM | POA: Diagnosis not present

## 2017-06-18 DIAGNOSIS — I214 Non-ST elevation (NSTEMI) myocardial infarction: Secondary | ICD-10-CM | POA: Diagnosis not present

## 2017-06-18 NOTE — Telephone Encounter (Signed)
New message     Pt refused home health care , she knows what she is doing

## 2017-06-18 NOTE — Telephone Encounter (Signed)
Returned call to Delta Air LinesLiz RN with [facility].She wanted Dr.Berry to know patient refused home health care.

## 2017-06-21 ENCOUNTER — Ambulatory Visit: Payer: Medicare Other | Admitting: Pulmonary Disease

## 2017-06-21 ENCOUNTER — Telehealth: Payer: Self-pay | Admitting: Adult Health

## 2017-06-21 ENCOUNTER — Encounter: Payer: Self-pay | Admitting: Adult Health

## 2017-06-21 ENCOUNTER — Ambulatory Visit (INDEPENDENT_AMBULATORY_CARE_PROVIDER_SITE_OTHER): Payer: Medicare Other | Admitting: Adult Health

## 2017-06-21 ENCOUNTER — Ambulatory Visit (HOSPITAL_BASED_OUTPATIENT_CLINIC_OR_DEPARTMENT_OTHER)
Admission: RE | Admit: 2017-06-21 | Discharge: 2017-06-21 | Disposition: A | Discharge status: Home / Self Care | Payer: Medicare Other | Source: Ambulatory Visit | Attending: Adult Health | Admitting: Adult Health

## 2017-06-21 VITALS — BP 153/62 | HR 61 | Wt 131.0 lb

## 2017-06-21 DIAGNOSIS — D509 Iron deficiency anemia, unspecified: Secondary | ICD-10-CM | POA: Diagnosis not present

## 2017-06-21 DIAGNOSIS — J9819 Other pulmonary collapse: Secondary | ICD-10-CM | POA: Diagnosis not present

## 2017-06-21 DIAGNOSIS — J9691 Respiratory failure, unspecified with hypoxia: Secondary | ICD-10-CM | POA: Diagnosis not present

## 2017-06-21 DIAGNOSIS — J449 Chronic obstructive pulmonary disease, unspecified: Secondary | ICD-10-CM

## 2017-06-21 DIAGNOSIS — J441 Chronic obstructive pulmonary disease with (acute) exacerbation: Secondary | ICD-10-CM

## 2017-06-21 DIAGNOSIS — I214 Non-ST elevation (NSTEMI) myocardial infarction: Secondary | ICD-10-CM | POA: Diagnosis not present

## 2017-06-21 DIAGNOSIS — J90 Pleural effusion, not elsewhere classified: Secondary | ICD-10-CM | POA: Insufficient documentation

## 2017-06-21 DIAGNOSIS — J9811 Atelectasis: Secondary | ICD-10-CM | POA: Diagnosis not present

## 2017-06-21 DIAGNOSIS — I272 Pulmonary hypertension, unspecified: Secondary | ICD-10-CM | POA: Diagnosis not present

## 2017-06-21 DIAGNOSIS — I5022 Chronic systolic (congestive) heart failure: Secondary | ICD-10-CM

## 2017-06-21 DIAGNOSIS — J9611 Chronic respiratory failure with hypoxia: Secondary | ICD-10-CM

## 2017-06-21 DIAGNOSIS — I5033 Acute on chronic diastolic (congestive) heart failure: Secondary | ICD-10-CM | POA: Diagnosis not present

## 2017-06-21 DIAGNOSIS — R0602 Shortness of breath: Secondary | ICD-10-CM | POA: Diagnosis not present

## 2017-06-21 NOTE — Assessment & Plan Note (Signed)
Diastolic /Systolic CHF with recent admission with CABG 2 weeks ago.  Wt is trending down with diuresis . Cont current regimen  follow up with PCP and Card  Check cxr for resolution of pleural effusions

## 2017-06-21 NOTE — Progress Notes (Signed)
Spoke with pt and notified of results per Rubye Oaksammy Parrett, NP. Pt verbalized understanding and denied any questions. She is scheduled to f/u with TP 07/19/17 in [place]

## 2017-06-21 NOTE — Assessment & Plan Note (Signed)
Severe COPD , O2 dependent with ongoing smoking - suspect a lot of her sx are due to volume overload-pleural effusions , anemia , deconditioning from chronic lung dz and recent surgery .  Will check CXR today  Cont on Sprivia and BREO  Cont diuresis from PCP /Cards - labs for later today with PCP .  Smoking cessation   Plan  Patient Instructions  Chest xray today.  Continue on BREO and Spiriva  Continue on oxygen 2l/m  MUST STOP SMOKING   Continue on Lasix and Metalazone as directed.  Follow up with Primary Care doctor today as planned.  [place] [facility] [facility]aid [place] [facility]  and As needed   Please contact office for sooner follow up if symptoms do not improve or worsen or seek emergency care

## 2017-06-21 NOTE — Telephone Encounter (Signed)
Pt is 2 weeks out from CABG x 4 , she has worsening Right pleural effusion - on diuresis  Should we consider thoracentesis vs continue diuresis .  Next appointment with CTS is 11/201/9

## 2017-06-21 NOTE — Progress Notes (Signed)
Reviewed, agree 

## 2017-06-21 NOTE — Patient Instructions (Addendum)
Chest xray today.  Continue on BREO and Spiriva  Continue on oxygen 2l/m  MUST STOP SMOKING   Continue on Lasix and Metalazone as directed.  Follow up with Primary Care doctor today as planned.  Follow [facility] Dr[facility] Kend[facility]aid [facility]  and As needed   Please contact office for sooner follow up if symptoms do not improve or worsen or seek emergency care

## 2017-06-21 NOTE — Progress Notes (Signed)
@Patient  ID: Samantha Dyer, female    DOB: [birth year], 66 y.o.   MRN: [id number]  Chief Complaint  Patient presents with  . Follow-up    COPD     Referring provider: Marinda Elk, MD  HPI: 66/F smoker for FU of gold stg 4 COPD, Hypercarbic/Hypoxic RF on o2 2l/m   Adm 06/2010 for an exacerbation treated wih avelox & steroids, prednisone has caused 'psychosis' in the past. Discharged on oxygen  06/21/2017 Follow up : COPD , O2 RF , Post Hospital follow up  Patient returns for a post hospital follow-up.  Patient was admitted last month for acute respiratory failure.  Patient was found to have pulmonary edema on admission.  She required intubation.  Echo showed an EF of 40-45%.. Patient was noted to have a NST EMI. Patient required aggressive diuresis and was able to be extubated on October 10.  Patient underwent pulmonary function test that showed FEV1 at 40%.  Patient was considered high risk for surgery but not prohibitive from pulmonary standpoint.  Patient underwent a CABG x4 on October 17.  Patient did have some postop anemia and required transfusion.  She did have A. fib with RVR.  Requiring amiodarone.  And required aggressive diuresis with Lasix and metolazone chest x-ray showed bilateral atelectasis and bilateral pleural effusions.. She did have a COPD exacerbation requiring prednisone during her hospitalization.  Patient says since discharge she has been weak.  Breathing has been up and down.  She is short of breath with minimal activity.  She remains on 2 L of oxygen.  Patient says she has had significant lower extremity swelling.  Was seen by her primary care physician and started on metolazone.  She was on Lasix 40 mg twice daily for 1 week post discharge.  And is now currently on 40 mg daily.  Weight over the last 5 days has went down 10 pounds.  Patient says leg swelling is some better.  She does have some orthopnea.  She denies any increased cough congestion fever chest pain  abdominal pain nausea vomiting or diarrhea.  Patient says she has been eating okay.  She remains on Breo and Spiriva.  She has been started on Daliresp in January at her last pulmonary office visit.  However does not appear to be taking this any longer. Patient has restarted smoking since discharge.  Patient was advised on the importance of smoking cessation.  Patient has an appointment with her primary care physician this afternoon with lab work pending.  Discussed flu shot today.  Patient said she would get at her primary care physician.  Allergies  Allergen Reactions  . Amoxicillin-Pot Clavulanate Swelling  . Augmentin [Amoxicillin-Pot Clavulanate] Swelling    Immunization History  Administered Date(s) Administered  . Influenza Split 05/21/2013, 05/05/2014, 06/04/2016  . Influenza Whole 06/21/2010  . Pneumococcal Polysaccharide-23 07/04/2010    Past Medical History:  Diagnosis Date  . Allergic rhinitis, cause unspecified   . Anemia, unspecified   . Backache, unspecified   . COPD (chronic obstructive pulmonary disease) (HCC)   . Esophageal reflux   . Iron deficiency anemia, unspecified     Tobacco History: History  Smoking Status  . Current Every Day Smoker  . Packs/day: 1.00  . Years: 43.00  . Types: Cigarettes  Smokeless Tobacco  . Never Used   Ready to quit: No Counseling given: Yes   Outpatient Encounter Prescriptions as of 06/21/2017  Medication Sig  . acetaminophen (TYLENOL) 325 MG tablet Take 650 mg  by mouth every 6 (six) hours as needed.  Marland Kitchen acetaminophen (TYLENOL) 325 MG tablet Take 650 mg by mouth every 6 (six) hours as needed for mild pain.  Marland Kitchen amiodarone (PACERONE) 400 MG tablet Take 1 tablet (400 mg total) by mouth 2 (two) times daily. For 7 Days then decrease to 400 mg daily  . aspirin EC 81 MG EC tablet Take 1 tablet (81 mg total) by mouth daily.  Marland Kitchen atorvastatin (LIPITOR) 80 MG tablet Take 1 tablet (80 mg total) by mouth daily at 6 PM.  . COMBIVENT  RESPIMAT 20-100 MCG/ACT AERS respimat Inhale 1 puff into the lungs 4 (four) times daily.   Marland Kitchen econazole nitrate 1 % cream Apply 1 application topically 2 (two) times daily.  Marland Kitchen esomeprazole (NEXIUM) 40 MG capsule Take 40 mg by mouth daily at 12 noon.  Marland Kitchen esomeprazole (NEXIUM) 40 MG capsule Take 40 mg by mouth daily at 12 noon.  . ferrous gluconate (FERGON) 324 MG tablet Take 1 tablet (324 mg total) by mouth 2 (two) times daily with a meal.  . fluticasone furoate-vilanterol (BREO ELLIPTA) 200-25 MCG/INH AEPB Inhale 1 puff into the lungs daily.  . fluticasone furoate-vilanterol (BREO ELLIPTA) 200-25 MCG/INH AEPB Inhale 1 puff into the lungs daily.  . fluticasone furoate-vilanterol (BREO ELLIPTA) 200-25 MCG/INH AEPB Inhale 1 puff into the lungs daily.  . furosemide (LASIX) 40 MG tablet Take 1 tablet (40 mg total) by mouth daily.  . furosemide (LASIX) 40 MG tablet Take 1 tablet (40 mg total) by mouth 2 (two) times daily. For 7 Days, then decrease to 40 mg daily  . guaiFENesin (MUCINEX) 600 MG 12 hr tablet Take 1 tablet (600 mg total) by mouth 2 (two) times daily as needed for to loosen phlegm.  Marland Kitchen guaiFENesin (MUCINEX) 600 MG 12 hr tablet Take 600 mg by mouth 2 (two) times daily as needed.   . metoprolol tartrate (LOPRESSOR) 25 MG tablet Take 1 tablet (25 mg total) by mouth 2 (two) times daily.  . OXYGEN Inhale into the lungs continuous. 2 L  . OXYGEN Inhale 2 L into the lungs continuous.  . potassium chloride SA (K-DUR,KLOR-CON) 20 MEQ tablet Take 1 tablet (20 mEq total) by mouth 2 (two) times daily. For 7 Days, then decrease to 20 mg daily  . predniSONE (DELTASONE) 10 MG tablet Prednisone dosing: Take  Prednisone 40mg  (4 tabs) x 3 days, then taper to 30mg  (3 tabs) x 3 days, then 20mg  (2 tabs) x 3days, then 10mg  (1 tab) x 3days, then OFF.  Marland Kitchen tiotropium (SPIRIVA HANDIHALER) 18 MCG inhalation capsule inhale contents of one capsule once daily  . tiotropium (SPIRIVA) 18 MCG inhalation capsule Place 18 mcg  into inhaler and inhale daily.  . Tiotropium Bromide Monohydrate (SPIRIVA RESPIMAT) 2.5 MCG/ACT AERS Inhale 2 puffs into the lungs daily.  . traMADol (ULTRAM) 50 MG tablet Take 1 tablet (50 mg total) by mouth every 4 (four) hours as needed for moderate pain.  . [DISCONTINUED] roflumilast (DALIRESP) 500 MCG TABS tablet Take 1 tablet (500 mcg total) by mouth daily.   No facility-administered encounter medications on file as of 06/21/2017.      Review of Systems  Constitutional:   No  weight loss, night sweats,  Fevers, chills,  +fatigue, or  lassitude.  HEENT:   No headaches,  Difficulty swallowing,  Tooth/dental problems, or  Sore throat,                No sneezing, itching, ear ache, nasal  congestion, post nasal drip,   CV:  No chest pain,  Orthopnea, PND,, anasarca, dizziness, palpitations, syncope.   GI  No heartburn, indigestion, abdominal pain, nausea, vomiting, diarrhea, change in bowel habits, loss of appetite, bloody stools.   Resp: ,  No non-productive cough,  No coughing up of blood.  No change in color of mucus.  No wheezing.  No chest wall deformity  Skin: no rash or lesions.  GU: no dysuria, change in color of urine, no urgency or frequency.  No flank pain, no hematuria   MS:  No joint pain or swelling.  No decreased range of motion.  No back pain.    Physical Exam  BP (!) 153/62   Pulse 61   Wt 131 lb (59.4 kg)   SpO2 97% Comment: 2 l/m  BMI 22.49 kg/m   GEN: A/Ox3; pleasant , NAD, frail elderly on oxygen in wheelchair   HEENT:  [place]/AT,  EACs-clear, TMs-wnl, NOSE-clear, THROAT-clear, no lesions, no postnasal drip or exudate noted.   NECK:  Supple w/ fair ROM; no JVD; normal carotid impulses w/o bruits; no thyromegaly or nodules palpated; no lymphadenopathy.    RESP diminished breath sounds in the bases.   no accessory muscle use, no dullness to percussion  CARD:  RRR, no m/r/g, 1+ peripheral edema, pulses intact, no cyanosis or clubbing.  GI:   Soft & nt;  nml bowel sounds; no organomegaly or masses detected.   Musco: Warm bil, no deformities or joint swelling noted.   Neuro: alert, no focal deficits noted.    Skin: Warm, no lesions or rashes    Lab Results:  CBC    Component Value Date/Time   WBC 13.7 (H) 06/12/2017 0205   RBC 2.61 (L) 06/12/2017 0205   HGB 7.9 (L) 06/12/2017 0205   HCT 24.3 (L) 06/12/2017 0205   PLT 127 (L) 06/12/2017 0205   MCV 93.1 06/12/2017 0205   MCH 30.3 06/12/2017 0205   MCHC 32.5 06/12/2017 0205   RDW 14.2 06/12/2017 0205   LYMPHSABS 0.3 (L) 05/29/2017 0127   MONOABS 0.6 05/29/2017 0127   EOSABS 0.0 05/29/2017 0127   BASOSABS 0.0 05/29/2017 0127    BMET    Component Value Date/Time   NA 131 (L) 06/12/2017 0205   K 3.9 06/12/2017 0205   CL 86 (L) 06/12/2017 0205   CO2 37 (H) 06/12/2017 0205   GLUCOSE 89 06/12/2017 0205   BUN 24 (H) 06/12/2017 0205   CREATININE 1.12 (H) 06/12/2017 0205   CALCIUM 8.4 (L) 06/12/2017 0205   GFRNONAA 50 (L) 06/12/2017 0205   GFRAA 58 (L) 06/12/2017 0205    BNP    Component Value Date/Time   BNP 1,600.6 (H) 05/29/2017 0530    ProBNP    Component Value Date/Time   PROBNP 294.0 (H) 07/11/2010 2354    Imaging: Dg Chest 2 View  Result Date: 06/12/2017 CLINICAL DATA:  Shortness of Breath EXAM: CHEST  2 VIEW COMPARISON:  06/11/2017 FINDINGS: Cardiac shadow remains enlarged. Postsurgical changes are again seen. Bilateral pleural effusions are again identified and stable. Mild degenerative changes of the thoracic spine are seen. IMPRESSION: Stable bibasilar changes.  No new focal abnormality is noted. Electronically Signed   By: Alcide Clever M.D.   On: 06/12/2017 07:41   Dg Chest Port 1 View  Result Date: 06/11/2017 CLINICAL DATA:  Shortness of breath. EXAM: PORTABLE CHEST 1 VIEW COMPARISON:  06/09/2017. FINDINGS: A portion of the RIGHT chest is excluded from the film. Cardiomegaly  with sequelae of prior CABG redemonstrated. No ET tube, chest tube, or  nasogastric tube is evident. BILATERAL pleural effusions, LEFT greater than RIGHT appear increased. There is bibasilar atelectasis. No definite consolidation. LEFT apical miniscule pneumothorax no longer seen. IMPRESSION: Resolved LEFT apical pneumothorax. Increased pleural effusions, particularly on the LEFT. Electronically Signed   By: Elsie Stain M.D.   On: 06/11/2017 07:34   Dg Chest Port 1 View  Result Date: 06/09/2017 CLINICAL DATA:  Status post CABG. EXAM: PORTABLE CHEST 1 VIEW COMPARISON:  06/08/2017 and prior radiographs FINDINGS: Cardiomegaly and CABG changes again noted. Mediastinal/thoracostomy tubes and a right IJ central venous catheter sheath have been removed. A miniscule left apical pneumothorax is unchanged. Bibasilar atelectasis, right greater than left and possible trace bilateral pleural effusions noted. No other significant change. IMPRESSION: Support apparatus removal with unchanged miniscule left apical pneumothorax. Bibasilar atelectasis and possible trace effusion again noted. Electronically Signed   By: Harmon Pier M.D.   On: 06/09/2017 07:20   Dg Chest Port 1 View  Result Date: 06/08/2017 CLINICAL DATA:  Status post CABG 06/06/2017. EXAM: PORTABLE CHEST 1 VIEW COMPARISON:  Single-view of the chest 06/07/2017. FINDINGS: Swan-Ganz catheter has been removed. Right IJ sheath remains in place. Mediastinal drain and left chest tube are also again seen. Aeration in the right lung base has worsened with a new small effusion and basilar atelectasis. Left basilar atelectasis is not notably changed. There is a new very small left apical pneumothorax. No right pneumothorax. Heart size is upper normal. IMPRESSION: Worsened aeration in the right lung base with a new small effusion and basilar atelectasis. New small left apical pneumothorax, 5% or less, with a left chest tube in place Electronically Signed   By: Drusilla Kanner M.D.   On: 06/08/2017 07:22   Dg Chest Port 1 View  Result  Date: 06/07/2017 CLINICAL DATA:  Status post CABG yesterday.  History of COPD. EXAM: PORTABLE CHEST 1 VIEW COMPARISON:  Chest x-ray of June 06, 2017 FINDINGS: Interval extubation of the trachea and of the esophagus. The lungs are well-expanded. The retrocardiac region is more dense today. There is no pneumothorax. The the left chest tube and the mediastinal drain are in stable position. There is a trace left pleural effusion. The right lung is clear. The heart is top-normal in size. The pulmonary vascularity is minimally prominent centrally but stable. The Swan-Ganz catheter tip projects over the proximal right main pulmonary artery. There is calcification in the wall of the aortic arch. IMPRESSION: Interval development of left lower lobe atelectasis. Trace bilateral pleural effusions but no pneumothorax. No significant pulmonary vascular congestion. The remaining support tubes are in good position. Electronically Signed   By: David  [place] M.D.   On: 06/07/2017 07:33   Dg Chest Port 1 View  Result Date: 06/06/2017 CLINICAL DATA:  Status post coronary artery bypass grafting. Hypoxia. EXAM: PORTABLE CHEST 1 VIEW COMPARISON:  June 02, 2017 FINDINGS: Endotracheal tube tip is 4.8 cm above the carina. Swan-Ganz catheter tip is in the right main pulmonary artery. Nasogastric tube tip and side port are below the diaphragm. There is a left chest tube and a mediastinal drain. Temporary pacemaker wires are attached to the right heart. No pneumothorax. There is mild interstitial pulmonary edema. No airspace consolidation. Heart is upper normal in size with mild pulmonary venous hypertension. There is aortic atherosclerosis. No bone lesions. Status post coronary artery bypass grafting. IMPRESSION: Tube and catheter positions as described without evident pneumothorax. Mild interstitial edema  without airspace consolidation. Aortic atherosclerosis. Heart upper normal in size. Aortic Atherosclerosis (ICD10-I70.0).  Electronically Signed   By: Bretta BangWilliam  Woodruff III M.D.   On: 06/06/2017 14:22   Dg Chest Port 1 View  Result Date: 06/02/2017 CLINICAL DATA:  Congestive heart failure. EXAM: PORTABLE CHEST 1 VIEW COMPARISON:  Radiograph of May 30, 2017. FINDINGS: Stable cardiomediastinal silhouette. Atherosclerosis of thoracic aorta is noted. No pneumothorax or pleural effusion is noted. Minimal interstitial densities are noted in both lung bases which may represent minimal pulmonary edema. Bony thorax is unremarkable. IMPRESSION: Possible minimal bibasilar pulmonary edema.  Aortic atherosclerosis. Electronically Signed   By: Lupita RaiderJames  Green Jr, M.D.   On: 06/02/2017 08:09   Dg Chest Port 1 View  Result Date: 05/30/2017 CLINICAL DATA:  Shortness of breath, pulmonary edema, COPD, CHF. EXAM: PORTABLE CHEST 1 VIEW COMPARISON:  Portable chest x-ray of May 29, 2017 FINDINGS: The lungs are well-expanded. The interstitial markings are mildly increased predominantly in the right upper and lower lobes. There is no pleural effusion. The heart is normal in size. The central pulmonary vascularity is prominent. There is calcification in the wall of the aortic arch. The endotracheal tube tip lies approximately 2.5 cm above the carina. The esophagogastric tube tip in proximal port project below the GE junction. IMPRESSION: COPD. Mild interstitial prominence greatest in the right upper lobe and right lower lobe may reflect pneumonia or asymmetric pulmonary edema. No alveolar infiltrate. Thoracic aortic atherosclerosis. The support tubes are in reasonable position. Electronically Signed   By: David  [place] M.D.   On: 05/30/2017 07:21   Dg Chest Port 1 View  Result Date: 05/29/2017 CLINICAL DATA:  Respiratory failure, intubation, COPD, smoker EXAM: PORTABLE CHEST 1 VIEW COMPARISON:  Portable exam 0008 hours without priors for comparison FINDINGS: Tip of endotracheal tube projects 13 mm above carina. Proximal side-port of nasogastric  tube is at or just above the gastroesophageal junction. Normal heart size mediastinal contours. Atherosclerotic calcification aorta. Pulmonary vascular congestion with mild infiltrates BILATERAL interstitial infiltrates favoring pulmonary edema though infection not excluded. No pneumothorax. Bones demineralized. IMPRESSION: Scattered interstitial infiltrates favor edema over infection. Tube positions as above. Electronically Signed   By: Ulyses SouthwardMark  Boles M.D.   On: 05/29/2017 00:40     Assessment & Plan:   COPD (chronic obstructive pulmonary disease) (HCC) Severe COPD , O2 dependent with ongoing smoking - suspect a lot of her sx are due to volume overload-pleural effusions , anemia , deconditioning from chronic lung dz and recent surgery .  Will check CXR today  Cont on Sprivia and BREO  Cont diuresis from PCP /Cards - labs for later today with PCP .  Smoking cessation   Plan  Patient Instructions  Chest xray today.  Continue on BREO and Spiriva  Continue on oxygen 2l/m  MUST STOP SMOKING   Continue on Lasix and Metalazone as directed.  Follow up with Primary Care doctor today as planned.  Follow [facility] Kendrick FriesMcQuaid [facility]  and As needed   Please contact office for sooner follow up if symptoms do not improve or worsen or seek emergency care       Chronic respiratory failure (HCC) Cont on o2 at 2l/m .   Systolic congestive heart failure (HCC) Diastolic /Systolic CHF with recent admission with CABG 2 weeks ago.  Wt is trending down with diuresis . Cont current regimen  follow up with PCP and Card  Check cxr for resolution of pleural effusions  Rubye Oaks, NP 06/21/2017

## 2017-06-21 NOTE — Telephone Encounter (Signed)
Would continue diuresis until appointment with [facility] If persistent effusion they can consider thoracentesis We will CC Dr. Dorris FetchHendrickson for his opinion

## 2017-06-21 NOTE — Assessment & Plan Note (Signed)
Cont on o2 at 2l/m .

## 2017-06-22 NOTE — Telephone Encounter (Signed)
Tried to call pt and he states that pt is talking to PCP right now and to please call back later

## 2017-06-22 NOTE — Telephone Encounter (Signed)
New  Message   [facility] calling stating patient is fluid overload    Down to 7lbs, sodium is dropping.   Still overload on chest xray .   appt from Friday 11/9 was move up to Monday 11[place]5 [place].   Labs was drawn on yesterday - results fax over [phone]

## 2017-06-24 NOTE — Progress Notes (Signed)
Cardiology Office Note    Date:  06/25/2017   ID:  Samantha Dyer, DOB Jul 03, 1951, MRN [id number]  PCP:  Marinda Elk, MD  Cardiologist: Dr. Allyson Sabal    Chief Complaint  Patient presents with  . Hospitalization Follow-up    s/p CABG    History of Present Illness:    Samantha Dyer is a 66 y.o. female with past medical history of chronic diastolic CHF, HTN, HLD, COPD (on 2L [place]), and tobacco use who presents to the office today for hospital follow-up.   She was recently admitted to [facility] from 05/29/2017 to 06/14/2017 for acute hypoxic respiratory failure requiring intubation at time of admission. Troponin values peaked at 0.97. Cardiology was consulted as she was found to have a newly reduced EF of 40-45% as compared to 55-60% in 06/2016. A cardiac catheterization was performed on 10/12 and showed severe 3-vessel CAD with CVTS consult recommended for consideration of CABG. She was taken to the OR by Dr. Dorris Fetch on 06/06/2017 and underwent CABG with LIMA-LAD, SVG-PDA, and Seq SVG-RI1-RI2. She experienced post-operative anemia following surgery and required multiple transfusion, with Hgb improving to 7.9 at the time of hospital discharge. She did experience thrombocytopenia with platelet count dropping to 49K during admission and HIT panel coming back elevated. Was noted to have gone into atrial fibrillation with RVR on 10/20 and converted to NSR within 24 hours of being initiated on IV Amiodarone. Did experience a recurrent episode on 10/23 with again conversion to NSR. She was discharged on Amiodarone 400mg  BID for 7 days then 400mg  daily. Was also diuresed with IV Lasix during admission and discharged on Lasix 40mg  BID for 7 days then 40mg  daily.   Her PCP called our office on 06/22/2017 reporting the patient was fluid overloaded by CXR but Na+ was low. Was seen by her Pulmonologist earlier in the day with a CXR showing right lower lung atelectasis with bilateral pleural effusions. Was   continued on diuretics at that time.   In talking with the patient today, she reports her most significant struggle since her recent hospitalization has been shortness of breath. She reports having dyspnea at rest and with activity. She was on 2L nasal cannula prior to hospital admission and remains on this amount currently. She was recently evaluated by Pulmonology as above and reports her medications were not adjusted at that time. She has an incentive spirometer but only uses this 2-3 times per day. Has monitored her weight since discharge and it has declined from 142 lbs to 124 lbs on her home scales.  She denies any recent episodes of chest discomfort. No drainage or erythema noted from her sternal surgical site. She has continued to notice bruising along her entire abdomen and back which has improved from her recent hospitalization but remains present. No recent melena, hematochezia, or hematuria.    Past Medical History:  Diagnosis Date  . Allergic rhinitis, cause unspecified   . Anemia, unspecified   . Backache, unspecified   . CAD (coronary artery disease)    a. cath on 06/01/17 showing severe 3-vessel CAD --> s/p CABG on 06/06/2017 with LIMA-LAD, SVG-PDA, and Seq SVG-RI1-RI2.  Marland Kitchen COPD (chronic obstructive pulmonary disease) (HCC)   . Esophageal reflux   . Iron deficiency anemia, unspecified     History reviewed. No pertinent surgical history.  Current Medications: Outpatient Medications Prior to Visit  Medication Sig Dispense Refill  . acetaminophen (TYLENOL) 325 MG tablet Take 650 mg by mouth every 6 (  six) hours as needed.    Marland Kitchen acetaminophen (TYLENOL) 325 MG tablet Take 650 mg by mouth every 6 (six) hours as needed for mild pain.    Marland Kitchen aspirin EC 81 MG EC tablet Take 1 tablet (81 mg total) by mouth daily.    Marland Kitchen atorvastatin (LIPITOR) 80 MG tablet Take 1 tablet (80 mg total) by mouth daily at 6 PM. 30 tablet 3  . COMBIVENT RESPIMAT 20-100 MCG/ACT AERS respimat Inhale 1 puff into  the lungs 4 (four) times daily.     Marland Kitchen econazole nitrate 1 % cream Apply 1 application topically 2 (two) times daily.    Marland Kitchen esomeprazole (NEXIUM) 40 MG capsule Take 40 mg by mouth daily at 12 noon.    Marland Kitchen esomeprazole (NEXIUM) 40 MG capsule Take 40 mg by mouth daily at 12 noon.    . ferrous gluconate (FERGON) 324 MG tablet Take 1 tablet (324 mg total) by mouth 2 (two) times daily with a meal. 60 tablet 0  . fluticasone furoate-vilanterol (BREO ELLIPTA) 200-25 MCG/INH AEPB Inhale 1 puff into the lungs daily. 1 each 0  . fluticasone furoate-vilanterol (BREO ELLIPTA) 200-25 MCG/INH AEPB Inhale 1 puff into the lungs daily. 1 each 0  . fluticasone furoate-vilanterol (BREO ELLIPTA) 200-25 MCG/INH AEPB Inhale 1 puff into the lungs daily.    . furosemide (LASIX) 40 MG tablet Take 1 tablet (40 mg total) by mouth 2 (two) times daily. For 7 Days, then decrease to 40 mg daily 60 tablet 3  . guaiFENesin (MUCINEX) 600 MG 12 hr tablet Take 1 tablet (600 mg total) by mouth 2 (two) times daily as needed for to loosen phlegm. 60 tablet 0  . guaiFENesin (MUCINEX) 600 MG 12 hr tablet Take 600 mg by mouth 2 (two) times daily as needed.     . metoprolol tartrate (LOPRESSOR) 25 MG tablet Take 1 tablet (25 mg total) by mouth 2 (two) times daily. 60 tablet 3  . OXYGEN Inhale into the lungs continuous. 2 L    . OXYGEN Inhale 2 L into the lungs continuous.    . potassium chloride SA (K-DUR,KLOR-CON) 20 MEQ tablet Take 1 tablet (20 mEq total) by mouth 2 (two) times daily. For 7 Days, then decrease to 20 mg daily 60 tablet 1  . predniSONE (DELTASONE) 10 MG tablet Prednisone dosing: Take  Prednisone 40mg  (4 tabs) x 3 days, then taper to 30mg  (3 tabs) x 3 days, then 20mg  (2 tabs) x 3days, then 10mg  (1 tab) x 3days, then OFF. 30 tablet 0  . tiotropium (SPIRIVA HANDIHALER) 18 MCG inhalation capsule inhale contents of one capsule once daily    . tiotropium (SPIRIVA) 18 MCG inhalation capsule Place 18 mcg into inhaler and inhale daily.     . Tiotropium Bromide Monohydrate (SPIRIVA RESPIMAT) 2.5 MCG/ACT AERS Inhale 2 puffs into the lungs daily. 1 Inhaler 0  . traMADol (ULTRAM) 50 MG tablet Take 1 tablet (50 mg total) by mouth every 4 (four) hours as needed for moderate pain. 40 tablet 0  . amiodarone (PACERONE) 400 MG tablet Take 1 tablet (400 mg total) by mouth 2 (two) times daily. For 7 Days then decrease to 400 mg daily 60 tablet 1  . furosemide (LASIX) 40 MG tablet Take 1 tablet (40 mg total) by mouth daily. 30 tablet 4   No facility-administered medications prior to visit.      Allergies:   Amoxicillin-pot clavulanate and Augmentin [amoxicillin-pot clavulanate]   Social History   Socioeconomic History  .  Marital status: Single    Spouse name: None  . Number of children: 2  . Years of education: None  . Highest education level: None  Social Needs  . Financial resource strain: None  . Food insecurity - worry: None  . Food insecurity - inability: None  . Transportation needs - medical: None  . Transportation needs - non-medical: None  Occupational History  . Occupation: SECRETARY    Employer: [employer]  Tobacco Use  . Smoking status: Current Every Day Smoker    Packs/day: 1.00    Years: 43.00    Pack years: 43.00    Types: Cigarettes  . Smokeless tobacco: Never Used  Substance and Sexual Activity  . Alcohol use: No  . Drug use: No  . Sexual activity: None  Other Topics Concern  . None  Social History Narrative   ** Merged History Encounter **         Family History:  The patient's family history includes Breast cancer in her mother; Emphysema in her mother; Heart disease in her mother; Pancreatic cancer in her father.   Review of Systems:   Please see the history of present illness.     General:  No chills, fever, night sweats or weight changes.  Cardiovascular:  No chest pain, edema, orthopnea, palpitations, paroxysmal nocturnal dyspnea. Positive for dyspnea on exertion.    Dermatological: No rash, lesions/masses Respiratory: No cough, Positive for dyspnea Urologic: No hematuria, dysuria Abdominal:   No nausea, vomiting, diarrhea, bright red blood per rectum, melena, or hematemesis Neurologic:  No visual changes, wkns, changes in mental status. All other systems reviewed and are otherwise negative except as noted above.   Physical Exam:    VS:  BP (!) 151/61   Pulse 68   Ht 5\' 4"  (1.626 m)   Wt 128 lb (58.1 kg)   BMI 21.97 kg/m    General: Well developed, elderly Caucasian female appearing in no acute distress. On 2L [place].  Head: Normocephalic, atraumatic, sclera non-icteric, no xanthomas, nares are without discharge.  Neck: No carotid bruits. JVD not elevated.  Lungs: Respirations regular and unlabored, decreased breath sounds along bases bilaterally.  Heart: Regular rate and rhythm with occasional ectopic beats. No S3 or S4.  No murmur, no rubs, or gallops appreciated. Sternal wound appears well-healing with no erythema or drainage.  Abdomen: Soft, non-tender, non-distended with normoactive bowel sounds. No hepatomegaly. No rebound/guarding. No obvious abdominal masses. Ecchymosis along abdomen (varous stages of healing).  Msk:  Strength and tone appear normal for age. No joint deformities or effusions. Extremities: No clubbing or cyanosis. 1+ pitting edema bilaterally.  Distal pedal pulses are 2+ bilaterally. Neuro: Alert and oriented X 3. Moves all extremities spontaneously. No focal deficits noted. Psych:  Responds to questions appropriately with a normal affect. Skin: No rashes or lesions noted  Wt Readings from Last 3 Encounters:  06/25/17 128 lb (58.1 kg)  06/21/17 131 lb (59.4 kg)  06/14/17 140 lb 9.6 oz (63.8 kg)     Studies/Labs Reviewed:   EKG:  EKG is ordered today.  The ekg ordered today demonstrates NSR, HR 60, with PVC's. LVH. No acute changes when compared to prior tracings.   Recent Labs: 07/08/2016: TSH 0.654 05/29/2017: ALT 13;  B Natriuretic Peptide 1,600.6 06/07/2017: Magnesium 2.1 06/12/2017: BUN 24; Creatinine, Ser 1.12; Hemoglobin 7.9; Platelets 127; Potassium 3.9; Sodium 131   Lipid Panel    Component Value Date/Time   CHOL 196 05/31/2017 0346  TRIG 104 06/01/2017 0311   HDL 63 05/31/2017 0346   CHOLHDL 3.1 05/31/2017 0346   VLDL 17 05/31/2017 0346   LDLCALC 116 (H) 05/31/2017 0346    Additional studies/ records that were reviewed today include:   Echocardiogram: 05/29/2017 Left ventricle:  The cavity size was normal. There was mild focal basal hypertrophy of the septum. Systolic function was mildly to moderately reduced. The estimated ejection fraction was in the range of 40% to 45%.  Regional wall motion abnormalities:   There is severe hypokinesis of the basal-midinferoseptal myocardium. There is akinesis of the basal-midinferior myocardium.  There is hypokinesis of the basal-midanteroseptal myocardium. The study is not technically sufficient to allow evaluation of LV diastolic Function.  Cardiac Catheterization: 06/01/2017  Ost LAD lesion, 90 %stenosed. Complex, calcified trifurcation lesion with ramus.  Ost Ramus lesion, 90 %stenosed. The ramus has two large branches which cover the lateral wall. The circumflex is relatively small.  Ost RCA to Prox RCA lesion, 100 %stenosed. There are left to right collaterals.  Prox Cx lesion, 70 %stenosed.  LV end diastolic pressure is normal.  The left ventricular ejection fraction is 35-45% by visual estimate.  There is no aortic valve stenosis.  Normal PA pressures; Ao 89%, PA 70%, CO 7.2 L/min; CI 4.4   Severe three vessel CAD.  Plan for CVTS consult for CABG.   Restart heparin in 6 hours   Assessment:    1. Coronary artery disease involving native coronary artery of native heart without angina pectoris   2. Ischemic cardiomyopathy   3. Chronic combined systolic (congestive) and diastolic (congestive) heart failure (HCC)   4. PAF  (paroxysmal atrial fibrillation) (HCC)   5. Essential hypertension   6. Hyperlipidemia LDL goal <70   7. Chronic obstructive pulmonary disease, unspecified COPD type (HCC)   8. Iron deficiency anemia, unspecified iron deficiency anemia type      Plan:   In order of problems listed above:  1. CAD - cardiac catheterization on 10/12 showed severe 3-vessel CAD. She underwent CABG on 06/06/2017 with LIMA-LAD, SVG-PDA, and Seq SVG-RI1-RI2.  - she denies any recurrent chest pain since hospital discharge. Reports having significant dyspnea which has been present since her hospitalization. Recent CXR showed atelectasis and bilateral pleural effusion. Will continue on Lasix 40mg  BID. Will obtain recent labs from PCP regarding creatinine and Hgb levels.  - continue ASA (discharged on 81mg  daily due to thrombocytopenia), BB, and statin therapy.   2. Chronic Combined Systolic and Diastolic CHF/ Ischemic Cardiomyopathy - EF 40-45% by most recent echocardiogram. CXR on 06/21/2017 showed right lower lung atelectasis with bilateral pleural effusions. Breath sounds remain decreased along the bases bilaterally.  - will continue Lasix at 40mg  BID. Labs were just checked by her PCP last week (will request these records).   - continue BB therapy. Consider addition of ARB at next follow-up appointment if BP and kidney function remain stable.   3. PAF - noted to have episodes of post-operative atrial fibrillation during admission. Was suppose to reduce Amiodarone dosing from 400mg  BID to 400mg  daily 7 days following hospital discharge but she has continued on BID dosing.  - she denies any recent palpitations. Maintaining NSR by EKG today. Will reduce Amiodarone to 400mg  daily. Not currently on anticoagulation given thrombocytopenia and PAF only being documented in the post-operative setting.   4. HTN - BP is at 151/61 during today's visit. Has not yet taken her morning medications. Was previously experiencing  episodes of hypotension.  -  continue Lopressor 25mg  BID and Lasix 40mg  BID. Add ARB as above as BP and creatinine remain stable.   5. HLD - Lipid Panel in 05/2017 showed total cholesterol of 196, HDL 63, and LDL 116. Goal LDL is < 70 with known CAD.  - continue Atorvastatin 80mg  daily. Will need repeat FLP and LFT's in 6-8 weeks.    6. COPD - followed by Pulmonology. Remains on 2L [place]. She has requested to have "E-tanks". Will send a message to Pulmonology today to follow-up on this and make sure the order has been entered to her [employer] company.   7. Iron Deficiency Anemia - Hgb at 7.9 at the time of hospital discharge. Rechecked on 11/1 and at 8.7.  - continue with iron supplementation.    Medication Adjustments/Labs and Tests Ordered: Current medicines are reviewed at length with the patient today.  Concerns regarding medicines are outlined above.  Medication changes, Labs and Tests ordered today are listed in the Patient Instructions below. Patient Instructions  Medication Instructions:  DECREASE Amiodarone to 1 tablet once a day CONTINUE Taking Lasix one tablet twice a day  Labwork: None   Testing/Procedures: None   Follow-Up: Your physician recommends that you schedule a follow-up appointment in: 2 Months with Dr Allyson Sabal  Any Other Special Instructions Will Be Listed Below (If Applicable).  If you need a refill on your cardiac medications before your next appointment, please call your pharmacy.    Signed, Ellsworth Lennox, PA-C  06/25/2017 5:09 PM    [facility] [address] [place], [address] [place], [place]  [address] Phone: [phone]; Fax: [phone]  [address], [address] [place], [place] [address] Phone: [phone]

## 2017-06-25 ENCOUNTER — Encounter: Payer: Self-pay | Admitting: Student

## 2017-06-25 ENCOUNTER — Telehealth: Payer: Self-pay | Admitting: Adult Health

## 2017-06-25 ENCOUNTER — Ambulatory Visit (INDEPENDENT_AMBULATORY_CARE_PROVIDER_SITE_OTHER): Payer: Medicare Other | Admitting: Student

## 2017-06-25 VITALS — BP 151/61 | HR 68 | Ht 64.0 in | Wt 128.0 lb

## 2017-06-25 DIAGNOSIS — D509 Iron deficiency anemia, unspecified: Secondary | ICD-10-CM | POA: Diagnosis not present

## 2017-06-25 DIAGNOSIS — E785 Hyperlipidemia, unspecified: Secondary | ICD-10-CM

## 2017-06-25 DIAGNOSIS — I255 Ischemic cardiomyopathy: Secondary | ICD-10-CM

## 2017-06-25 DIAGNOSIS — I10 Essential (primary) hypertension: Secondary | ICD-10-CM

## 2017-06-25 DIAGNOSIS — I480 Paroxysmal atrial fibrillation: Secondary | ICD-10-CM

## 2017-06-25 DIAGNOSIS — I5042 Chronic combined systolic (congestive) and diastolic (congestive) heart failure: Secondary | ICD-10-CM

## 2017-06-25 DIAGNOSIS — I2510 Atherosclerotic heart disease of native coronary artery without angina pectoris: Secondary | ICD-10-CM | POA: Diagnosis not present

## 2017-06-25 DIAGNOSIS — J449 Chronic obstructive pulmonary disease, unspecified: Secondary | ICD-10-CM | POA: Diagnosis not present

## 2017-06-25 MED ORDER — AMIODARONE HCL 400 MG PO TABS: 400.0000 mg | ORAL_TABLET | Freq: Every day | ORAL | 1 refills | Status: DC

## 2017-06-25 MED ORDER — AMIODARONE HCL 400 MG PO TABS: 400.0000 mg | ORAL_TABLET | Freq: Every day | ORAL | 1 refills | Status: AC

## 2017-06-25 NOTE — Telephone Encounter (Signed)
Spoke with Tee with [facility], who states pt had OV today and requested E tanks.  I have lm for pt to gather further information and DME company.

## 2017-06-25 NOTE — Telephone Encounter (Addendum)
ATC [facility] and was placed on hold for >468min Will call back

## 2017-06-25 NOTE — Patient Instructions (Signed)
Medication Instructions:  DECREASE Amiodarone to 1 tablet once a day CONTINUE Taking Lasix one tablet twice a day  Labwork: None   Testing/Procedures: None   Follow-Up: Your physician recommends that you schedule a follow-up appointment in: 2 Months with Dr Allyson SabalBerry  Any Other Special Instructions Will Be Listed Below (If Applicable).  If you need a refill on your cardiac medications before your next appointment, please call your pharmacy.

## 2017-06-26 ENCOUNTER — Emergency Department (HOSPITAL_COMMUNITY): Payer: Medicare Other

## 2017-06-26 ENCOUNTER — Inpatient Hospital Stay (HOSPITAL_COMMUNITY): Payer: Medicare Other

## 2017-06-26 ENCOUNTER — Inpatient Hospital Stay (HOSPITAL_COMMUNITY)
Admission: EM | Admit: 2017-06-26 | Discharge: 2017-07-05 | DRG: 640 | Disposition: A | Discharge status: Home Health | Payer: Medicare Other | Attending: Internal Medicine | Admitting: Internal Medicine

## 2017-06-26 DIAGNOSIS — Z7982 Long term (current) use of aspirin: Secondary | ICD-10-CM

## 2017-06-26 DIAGNOSIS — T502X5A Adverse effect of carbonic-anhydrase inhibitors, benzothiadiazides and other diuretics, initial encounter: Secondary | ICD-10-CM | POA: Diagnosis present

## 2017-06-26 DIAGNOSIS — R0602 Shortness of breath: Secondary | ICD-10-CM | POA: Diagnosis not present

## 2017-06-26 DIAGNOSIS — D509 Iron deficiency anemia, unspecified: Secondary | ICD-10-CM | POA: Diagnosis not present

## 2017-06-26 DIAGNOSIS — M549 Dorsalgia, unspecified: Secondary | ICD-10-CM | POA: Diagnosis present

## 2017-06-26 DIAGNOSIS — Z881 Allergy status to other antibiotic agents status: Secondary | ICD-10-CM | POA: Diagnosis not present

## 2017-06-26 DIAGNOSIS — E7439 Other disorders of intestinal carbohydrate absorption: Secondary | ICD-10-CM | POA: Diagnosis present

## 2017-06-26 DIAGNOSIS — J9621 Acute and chronic respiratory failure with hypoxia: Secondary | ICD-10-CM | POA: Diagnosis present

## 2017-06-26 DIAGNOSIS — Z9889 Other specified postprocedural states: Secondary | ICD-10-CM

## 2017-06-26 DIAGNOSIS — E2740 Unspecified adrenocortical insufficiency: Secondary | ICD-10-CM | POA: Diagnosis present

## 2017-06-26 DIAGNOSIS — I361 Nonrheumatic tricuspid (valve) insufficiency: Secondary | ICD-10-CM | POA: Diagnosis not present

## 2017-06-26 DIAGNOSIS — R846 Abnormal cytological findings in specimens from respiratory organs and thorax: Secondary | ICD-10-CM | POA: Diagnosis not present

## 2017-06-26 DIAGNOSIS — J939 Pneumothorax, unspecified: Secondary | ICD-10-CM

## 2017-06-26 DIAGNOSIS — J441 Chronic obstructive pulmonary disease with (acute) exacerbation: Secondary | ICD-10-CM | POA: Diagnosis not present

## 2017-06-26 DIAGNOSIS — F17210 Nicotine dependence, cigarettes, uncomplicated: Secondary | ICD-10-CM | POA: Diagnosis present

## 2017-06-26 DIAGNOSIS — J860 Pyothorax with fistula: Secondary | ICD-10-CM | POA: Diagnosis present

## 2017-06-26 DIAGNOSIS — E871 Hypo-osmolality and hyponatremia: Secondary | ICD-10-CM | POA: Diagnosis not present

## 2017-06-26 DIAGNOSIS — I480 Paroxysmal atrial fibrillation: Secondary | ICD-10-CM | POA: Diagnosis present

## 2017-06-26 DIAGNOSIS — R069 Unspecified abnormalities of breathing: Secondary | ICD-10-CM | POA: Diagnosis not present

## 2017-06-26 DIAGNOSIS — Z23 Encounter for immunization: Secondary | ICD-10-CM

## 2017-06-26 DIAGNOSIS — E873 Alkalosis: Secondary | ICD-10-CM | POA: Diagnosis present

## 2017-06-26 DIAGNOSIS — I959 Hypotension, unspecified: Secondary | ICD-10-CM | POA: Diagnosis not present

## 2017-06-26 DIAGNOSIS — Z7951 Long term (current) use of inhaled steroids: Secondary | ICD-10-CM | POA: Diagnosis not present

## 2017-06-26 DIAGNOSIS — I10 Essential (primary) hypertension: Secondary | ICD-10-CM | POA: Diagnosis not present

## 2017-06-26 DIAGNOSIS — J9690 Respiratory failure, unspecified, unspecified whether with hypoxia or hypercapnia: Secondary | ICD-10-CM | POA: Diagnosis not present

## 2017-06-26 DIAGNOSIS — J948 Other specified pleural conditions: Secondary | ICD-10-CM

## 2017-06-26 DIAGNOSIS — R0902 Hypoxemia: Secondary | ICD-10-CM | POA: Diagnosis not present

## 2017-06-26 DIAGNOSIS — K219 Gastro-esophageal reflux disease without esophagitis: Secondary | ICD-10-CM | POA: Diagnosis present

## 2017-06-26 DIAGNOSIS — J95811 Postprocedural pneumothorax: Secondary | ICD-10-CM | POA: Diagnosis not present

## 2017-06-26 DIAGNOSIS — I5032 Chronic diastolic (congestive) heart failure: Secondary | ICD-10-CM | POA: Diagnosis not present

## 2017-06-26 DIAGNOSIS — I5042 Chronic combined systolic (congestive) and diastolic (congestive) heart failure: Secondary | ICD-10-CM | POA: Diagnosis present

## 2017-06-26 DIAGNOSIS — L8990 Pressure ulcer of unspecified site, unspecified stage: Secondary | ICD-10-CM

## 2017-06-26 DIAGNOSIS — Z951 Presence of aortocoronary bypass graft: Secondary | ICD-10-CM

## 2017-06-26 DIAGNOSIS — J9601 Acute respiratory failure with hypoxia: Secondary | ICD-10-CM | POA: Diagnosis not present

## 2017-06-26 DIAGNOSIS — Z9981 Dependence on supplemental oxygen: Secondary | ICD-10-CM

## 2017-06-26 DIAGNOSIS — Z79899 Other long term (current) drug therapy: Secondary | ICD-10-CM

## 2017-06-26 DIAGNOSIS — J90 Pleural effusion, not elsewhere classified: Secondary | ICD-10-CM | POA: Diagnosis present

## 2017-06-26 DIAGNOSIS — E876 Hypokalemia: Secondary | ICD-10-CM | POA: Diagnosis present

## 2017-06-26 DIAGNOSIS — J181 Lobar pneumonia, unspecified organism: Secondary | ICD-10-CM | POA: Diagnosis not present

## 2017-06-26 DIAGNOSIS — I2510 Atherosclerotic heart disease of native coronary artery without angina pectoris: Secondary | ICD-10-CM | POA: Diagnosis present

## 2017-06-26 DIAGNOSIS — R69 Illness, unspecified: Secondary | ICD-10-CM

## 2017-06-26 LAB — COMPREHENSIVE METABOLIC PANEL
ALK PHOS: 117 U/L (ref 38–126)
ALT: 13 U/L — AB (ref 14–54)
AST: 22 U/L (ref 15–41)
Albumin: 3.3 g/dL — ABNORMAL LOW (ref 3.5–5.0)
BUN: 6 mg/dL (ref 6–20)
CALCIUM: 8.2 mg/dL — AB (ref 8.9–10.3)
CO2: 49 mmol/L — ABNORMAL HIGH (ref 22–32)
CREATININE: 0.79 mg/dL (ref 0.44–1.00)
Glucose, Bld: 125 mg/dL — ABNORMAL HIGH (ref 65–99)
Potassium: 2.4 mmol/L — CL (ref 3.5–5.1)
SODIUM: 116 mmol/L — AB (ref 135–145)
Total Bilirubin: 1.5 mg/dL — ABNORMAL HIGH (ref 0.3–1.2)
Total Protein: 5.4 g/dL — ABNORMAL LOW (ref 6.5–8.1)

## 2017-06-26 LAB — CBC WITH DIFFERENTIAL/PLATELET
BASOS ABS: 0 10*3/uL (ref 0.0–0.1)
Basophils Relative: 0 %
EOS ABS: 0 10*3/uL (ref 0.0–0.7)
EOS PCT: 1 %
HCT: 26.5 % — ABNORMAL LOW (ref 36.0–46.0)
HEMOGLOBIN: 9.2 g/dL — AB (ref 12.0–15.0)
Lymphocytes Relative: 7 %
Lymphs Abs: 0.2 10*3/uL — ABNORMAL LOW (ref 0.7–4.0)
MCH: 29.9 pg (ref 26.0–34.0)
MCHC: 34.7 g/dL (ref 30.0–36.0)
MCV: 86 fL (ref 78.0–100.0)
Monocytes Absolute: 0.4 10*3/uL (ref 0.1–1.0)
Monocytes Relative: 14 %
NEUTROS PCT: 78 %
Neutro Abs: 2.4 10*3/uL (ref 1.7–7.7)
PLATELETS: 169 10*3/uL (ref 150–400)
RBC: 3.08 MIL/uL — AB (ref 3.87–5.11)
RDW: 14.7 % (ref 11.5–15.5)
WBC: 3.1 10*3/uL — AB (ref 4.0–10.5)

## 2017-06-26 LAB — PROTIME-INR
INR: 1.06
PROTHROMBIN TIME: 13.7 s (ref 11.4–15.2)

## 2017-06-26 LAB — GLUCOSE, CAPILLARY
GLUCOSE-CAPILLARY: 110 mg/dL — AB (ref 65–99)
Glucose-Capillary: 112 mg/dL — ABNORMAL HIGH (ref 65–99)

## 2017-06-26 LAB — URINALYSIS, ROUTINE W REFLEX MICROSCOPIC
BILIRUBIN URINE: NEGATIVE
GLUCOSE, UA: NEGATIVE mg/dL
HGB URINE DIPSTICK: NEGATIVE
KETONES UR: NEGATIVE mg/dL
Leukocytes, UA: NEGATIVE
Nitrite: NEGATIVE
PH: 9 — AB (ref 5.0–8.0)
Protein, ur: NEGATIVE mg/dL
Specific Gravity, Urine: 1.034 — ABNORMAL HIGH (ref 1.005–1.030)

## 2017-06-26 LAB — I-STAT ARTERIAL BLOOD GAS, ED
BICARBONATE: 63.8 mmol/L — AB (ref 20.0–28.0)
O2 SAT: 92 %
PO2 ART: 61 mmHg — AB (ref 83.0–108.0)
pCO2 arterial: 75.3 mmHg (ref 32.0–48.0)
pH, Arterial: 7.536 — ABNORMAL HIGH (ref 7.350–7.450)

## 2017-06-26 LAB — OSMOLALITY: Osmolality: 242 mOsm/kg — CL (ref 275–295)

## 2017-06-26 LAB — CORTISOL: Cortisol, Plasma: 23.2 ug/dL

## 2017-06-26 LAB — I-STAT TROPONIN, ED
Troponin i, poc: 0.01 ng/mL (ref 0.00–0.08)
Troponin i, poc: 0.02 ng/mL (ref 0.00–0.08)

## 2017-06-26 LAB — PHOSPHORUS: Phosphorus: 2.5 mg/dL (ref 2.5–4.6)

## 2017-06-26 LAB — MRSA PCR SCREENING: MRSA by PCR: NEGATIVE

## 2017-06-26 LAB — I-STAT CG4 LACTIC ACID, ED
LACTIC ACID, VENOUS: 0.63 mmol/L (ref 0.5–1.9)
Lactic Acid, Venous: 1.3 mmol/L (ref 0.5–1.9)

## 2017-06-26 LAB — BRAIN NATRIURETIC PEPTIDE
B NATRIURETIC PEPTIDE 5: 551.2 pg/mL — AB (ref 0.0–100.0)
B NATRIURETIC PEPTIDE 5: 484.1 pg/mL — AB (ref 0.0–100.0)

## 2017-06-26 MED ORDER — SODIUM CHLORIDE 0.9 % IV BOLUS (SEPSIS)
500.0000 mL | Freq: Once | INTRAVENOUS | Status: AC
  Administered 2017-06-26: 500 mL via INTRAVENOUS

## 2017-06-26 MED ORDER — INSULIN ASPART 100 UNIT/ML ~~LOC~~ SOLN
0.0000 [IU] | Freq: Three times a day (TID) | SUBCUTANEOUS | Status: DC
  Administered 2017-06-27: 1 [IU] via SUBCUTANEOUS
  Administered 2017-06-28: 2 [IU] via SUBCUTANEOUS
  Administered 2017-06-30: 1 [IU] via SUBCUTANEOUS

## 2017-06-26 MED ORDER — ASPIRIN 81 MG PO CHEW
324.0000 mg | CHEWABLE_TABLET | ORAL | Status: AC
  Administered 2017-06-26: 324 mg via ORAL
  Filled 2017-06-26: qty 4

## 2017-06-26 MED ORDER — ACETAZOLAMIDE SODIUM 500 MG IJ SOLR
500.0000 mg | Freq: Once | INTRAMUSCULAR | Status: AC
  Administered 2017-06-26: 500 mg via INTRAVENOUS
  Filled 2017-06-26: qty 500

## 2017-06-26 MED ORDER — POTASSIUM CHLORIDE CRYS ER 20 MEQ PO TBCR
40.0000 meq | EXTENDED_RELEASE_TABLET | Freq: Once | ORAL | Status: AC
  Administered 2017-06-26: 40 meq via ORAL
  Filled 2017-06-26: qty 2

## 2017-06-26 MED ORDER — POTASSIUM CHLORIDE 20 MEQ PO PACK
20.0000 meq | PACK | Freq: Four times a day (QID) | ORAL | Status: AC
  Administered 2017-06-26 – 2017-06-27 (×4): 20 meq via ORAL
  Filled 2017-06-26 (×4): qty 1

## 2017-06-26 MED ORDER — ASPIRIN 300 MG RE SUPP: 300.0000 mg | RECTAL | Status: AC

## 2017-06-26 MED ORDER — SODIUM CHLORIDE 0.9 % IV SOLN: 250.0000 mL | INTRAVENOUS | Status: DC | PRN

## 2017-06-26 MED ORDER — POTASSIUM CHLORIDE 10 MEQ/100ML IV SOLN
10.0000 meq | Freq: Once | INTRAVENOUS | Status: AC
  Administered 2017-06-26: 10 meq via INTRAVENOUS
  Filled 2017-06-26: qty 100

## 2017-06-26 MED ORDER — IOPAMIDOL (ISOVUE-370) INJECTION 76%
INTRAVENOUS | Status: AC
  Administered 2017-06-26: 100 mL
  Filled 2017-06-26: qty 100

## 2017-06-26 MED ORDER — POTASSIUM CHLORIDE IN NACL 40-0.9 MEQ/L-% IV SOLN
INTRAVENOUS | Status: DC
Start: 2017-06-26 — End: 2017-06-27
  Administered 2017-06-26 – 2017-06-27 (×2): 50 mL/h via INTRAVENOUS
  Filled 2017-06-26 (×2): qty 1000

## 2017-06-26 MED ORDER — POTASSIUM CHLORIDE IN NACL 20-0.9 MEQ/L-% IV SOLN
Freq: Once | INTRAVENOUS | Status: AC
  Administered 2017-06-26: 14:00:00 via INTRAVENOUS
  Filled 2017-06-26: qty 1000

## 2017-06-26 MED ORDER — HEPARIN SODIUM (PORCINE) 5000 UNIT/ML IJ SOLN
5000.0000 [IU] | Freq: Three times a day (TID) | INTRAMUSCULAR | Status: DC
  Administered 2017-06-26 – 2017-07-05 (×27): 5000 [IU] via SUBCUTANEOUS
  Filled 2017-06-26 (×28): qty 1

## 2017-06-26 NOTE — ED Triage Notes (Signed)
Pt arrives via EMS from home with SOB for the last week. States this AM SOB became worse. Pt with recent CABG Oct 16th. Pt denies recent fever, cough, smoking. Endorses swelling in lower extremities. Pt awake, alert, appropriate, tachypneic, lung sounds diminished.

## 2017-06-26 NOTE — Telephone Encounter (Signed)
Attempted to call the pt but the phone will ring and then it hangs up.  Will try back later.

## 2017-06-26 NOTE — ED Provider Notes (Signed)
[facility] Provider [facility]   CSN: [id number] Arrival date & time: 06/26/17  1011     History   Chief Complaint Chief Complaint  Patient presents with  . Shortness of Breath    HPI Samantha Dyer is a 66 y.o. female.  HPI History of COPD with recent CABG x4 vessels [phone].  Postprocedural atrial fibrillation and CHF.  Patient reports she has been short of breath and weak since her discharge approximately a week ago.  She reports she is taking her Lasix as prescribed and the swelling in her legs has decreased but today she feels increasingly short of breath.  She denies chest pain.  She denies calf pain.  No increase of cough or documented fever. Past Medical History:  Diagnosis Date  . Allergic rhinitis, cause unspecified   . Anemia, unspecified   . Backache, unspecified   . CAD (coronary artery disease)    a. cath on 06/01/17 showing severe 3-vessel CAD --> s/p CABG on 06/06/2017 with LIMA-LAD, SVG-PDA, and Seq SVG-RI1-RI2.  Marland Kitchen. COPD (chronic obstructive pulmonary disease) (HCC)   . Esophageal reflux   . Iron deficiency anemia, unspecified     Patient Active Problem List   Diagnosis Date Noted  . CAD (coronary artery disease) 06/25/2017  . S/P CABG x 4 06/06/2017  . Ischemic cardiomyopathy   . Hypokalemia   . Pure hyperglyceridemia   . Non-ST elevation (NSTEMI) myocardial infarction (HCC)   . Systolic congestive heart failure (HCC)   . Acute on chronic respiratory failure with hypoxia and hypercapnia (HCC) 05/29/2017  . COPD exacerbation (HCC)   . Acute respiratory failure with hypoxia and hypercapnia (HCC)   . Acute diastolic (congestive) heart failure (HCC)   . Hyponatremia   . Acute on chronic respiratory failure (HCC) 09/12/2016  . Acute respiratory failure with hypoxemia (HCC) 07/10/2016  . Hypomagnesemia 07/08/2016  . Hyponatremia 07/07/2016  . Acute respiratory failure with hypoxia (HCC) 07/07/2016  . COPD (chronic  obstructive pulmonary disease) (HCC) 07/07/2016  . COPD exacerbation (HCC) 08/28/2015  . TOBACCO ABUSE 07/26/2010  . Chronic respiratory failure (HCC) 07/26/2010  . UNSPECIFIED IRON DEFICIENCY ANEMIA 07/25/2010  . UNSPECIFIED ANEMIA 07/25/2010  . ALLERGIC RHINITIS CAUSE UNSPECIFIED 07/25/2010  . C O P D 07/25/2010  . Esophageal reflux 07/25/2010  . UNSPECIFIED BACKACHE 07/25/2010    No past surgical history on file.  OB History    No data available       Home Medications    Prior to Admission medications   Medication Sig Start Date End Date Taking? Authorizing Provider  acetaminophen (TYLENOL) 325 MG tablet Take 650 mg by mouth every 6 (six) hours as needed for mild pain.   Yes [provider]  amiodarone (PACERONE) 400 MG tablet Take 1 tablet (400 mg total) daily by mouth. 06/25/17  Yes Strader, Lennart PallBrittany M, PA-C  aspirin EC 81 MG EC tablet Take 1 tablet (81 mg total) by mouth daily. 06/14/17  Yes Barrett, Erin R, PA-C  atorvastatin (LIPITOR) 80 MG tablet Take 1 tablet (80 mg total) by mouth daily at 6 PM. 06/14/17  Yes Barrett, Erin R, PA-C  COMBIVENT RESPIMAT 20-100 MCG/ACT AERS respimat Inhale 1 puff into the lungs 4 (four) times daily.  06/09/16  Yes [provider]  ferrous gluconate (FERGON) 324 MG tablet Take 1 tablet (324 mg total) by mouth 2 (two) times daily with a meal. 06/14/17  Yes Barrett, Erin R, PA-C  fluticasone furoate-vilanterol (BREO ELLIPTA) 200-25  MCG/INH AEPB Inhale 1 puff into the lungs daily. 09/21/16  Yes Parrett, Tammy S, NP  furosemide (LASIX) 40 MG tablet Take 1 tablet (40 mg total) by mouth 2 (two) times daily. For 7 Days, then decrease to 40 mg daily Patient taking differently: Take 40 mg daily by mouth.  06/14/17  Yes Barrett, Erin R, PA-C  metolazone (ZAROXOLYN) 5 MG tablet Take 5 mg daily by mouth.   Yes [provider]  metoprolol tartrate (LOPRESSOR) 25 MG tablet Take 1 tablet (25 mg total) by mouth 2 (two) times daily.  06/14/17  Yes Barrett, Erin R, PA-C  OXYGEN Inhale 2 L into the lungs continuous.   Yes [provider]  potassium chloride SA (K-DUR,KLOR-CON) 20 MEQ tablet Take 1 tablet (20 mEq total) by mouth 2 (two) times daily. For 7 Days, then decrease to 20 mg daily Patient taking differently: Take 20 mEq daily by mouth.  06/14/17  Yes Barrett, Erin R, PA-C  tiotropium (SPIRIVA HANDIHALER) 18 MCG inhalation capsule inhale contents of one capsule once daily 03/15/16  Yes [provider]  traMADol (ULTRAM) 50 MG tablet Take 1 tablet (50 mg total) by mouth every 4 (four) hours as needed for moderate pain. 06/13/17  Yes Barrett, Erin R, PA-C  fluticasone furoate-vilanterol (BREO ELLIPTA) 200-25 MCG/INH AEPB Inhale 1 puff into the lungs daily. Patient not taking: Reported on 06/26/2017 07/27/16   Parrett, Virgel Bouquet, NP  guaiFENesin (MUCINEX) 600 MG 12 hr tablet Take 1 tablet (600 mg total) by mouth 2 (two) times daily as needed for to loosen phlegm. Patient not taking: Reported on 06/26/2017 07/09/16   Calvert Cantor, MD  predniSONE (DELTASONE) 10 MG tablet Prednisone dosing: Take  Prednisone 40mg  (4 tabs) x 3 days, then taper to 30mg  (3 tabs) x 3 days, then 20mg  (2 tabs) x 3days, then 10mg  (1 tab) x 3days, then OFF. Patient not taking: Reported on 06/26/2017 09/15/16   Rai, Delene Ruffini, MD  Tiotropium Bromide Monohydrate (SPIRIVA RESPIMAT) 2.5 MCG/ACT AERS Inhale 2 puffs into the lungs daily. Patient not taking: Reported on 06/26/2017 09/21/16   Parrett, Virgel Bouquet, NP    Family History Family History  Problem Relation Age of Onset  . Emphysema Mother   . Heart disease Mother   . Breast cancer Mother   . Pancreatic cancer Father     Social History Social History   Tobacco Use  . Smoking status: Current Every Day Smoker    Packs/day: 1.00    Years: 43.00    Pack years: 43.00    Types: Cigarettes  . Smokeless tobacco: Never Used  Substance Use Topics  . Alcohol use: No  . Drug use: No      Allergies   Amoxicillin-pot clavulanate and Augmentin [amoxicillin-pot clavulanate]   Review of Systems Review of Systems 10 Systems reviewed and are negative for acute change except as noted in the HPI.   Physical Exam Updated Vital Signs BP (!) 82/45   Pulse 69   Temp 98.3 F (36.8 C) (Oral)   Resp 13   SpO2 95%   Physical Exam  Constitutional:   is alert but fatigued in appearance.     ED Treatments / Results  Labs (all labs ordered are listed, but only abnormal results are displayed) Labs Reviewed  COMPREHENSIVE METABOLIC PANEL - Abnormal; Notable for the following components:      Result Value   Sodium 116 (*)    Potassium 2.4 (*)    Chloride <65 (*)  CO2 49 (*)    Glucose, Bld 125 (*)    Calcium 8.2 (*)    Total Protein 5.4 (*)    Albumin 3.3 (*)    ALT 13 (*)    Total Bilirubin 1.5 (*)    All other components within normal limits  BRAIN NATRIURETIC PEPTIDE - Abnormal; Notable for the following components:   B Natriuretic Peptide 551.2 (*)    All other components within normal limits  CBC WITH DIFFERENTIAL/PLATELET - Abnormal; Notable for the following components:   WBC 3.1 (*)    RBC 3.08 (*)    Hemoglobin 9.2 (*)    HCT 26.5 (*)    Lymphs Abs 0.2 (*)    All other components within normal limits  I-STAT ARTERIAL BLOOD GAS, ED - Abnormal; Notable for the following components:   pH, Arterial 7.536 (*)    pCO2 arterial 75.3 (*)    pO2, Arterial 61 (*)    Bicarbonate 63.8 (*)    TCO2 >50.0 (*)    Acid-Base Excess >30.0 (*)    All other components within normal limits  CULTURE, BLOOD (ROUTINE X 2)  CULTURE, BLOOD (ROUTINE X 2)  PROTIME-INR  URINALYSIS, ROUTINE W REFLEX MICROSCOPIC  BLOOD GAS, ARTERIAL  CORTISOL  BRAIN NATRIURETIC PEPTIDE  PHOSPHORUS  OSMOLALITY  BASIC METABOLIC PANEL  I-STAT CG4 LACTIC ACID, ED  I-STAT TROPONIN, ED  I-STAT CG4 LACTIC ACID, ED  I-STAT TROPONIN, ED  I-STAT CG4 LACTIC ACID, ED    EKG  EKG  Interpretation None       Radiology Ct Angio Chest Pe W And/or Wo Contrast  Result Date: 06/26/2017 CLINICAL DATA:  Shortness of breath for 1 week, recent CABG on 06/05/2017, lower extremity swelling, question pulmonary embolism with high pretest probability, history COPD, coronary disease, smoker EXAM: CT ANGIOGRAPHY CHEST WITH CONTRAST TECHNIQUE: Multidetector CT imaging of the chest was performed using the standard protocol during bolus administration of intravenous contrast. Multiplanar CT image reconstructions and MIPs were obtained to evaluate the vascular anatomy. CONTRAST:  100 cc Isovue 370 IV COMPARISON:  09/05/2015 FINDINGS: Cardiovascular: Atherosclerotic calcifications aorta, coronary arteries and proximal great vessels. Aorta normal caliber without aneurysm or dissection. Minimal pericardial effusion. Pulmonary arteries well opacified and patent. No evidence pulmonary embolism. Mediastinum/Nodes: No esophageal abnormalities. Prior median sternotomy. Minimal stranding in the anterior mediastinum consistent recent surgery. Base of cervical region unremarkable. No thoracic adenopathy. Lungs/Pleura: BILATERAL pleural effusions small on LEFT and moderate on RIGHT. Compressive atelectasis of the lower lobes. Subsegmental atelectasis RIGHT middle lobe. Additional mild atelectasis along RIGHT major fissure. Center peribronchial thickening. No pulmonary infiltrate or pneumothorax. Upper Abdomen: Visualized upper abdomen unremarkable Musculoskeletal: No acute osseous findings. Review of the MIP images confirms the above findings. IMPRESSION: No evidence of pulmonary embolism. BILATERAL pleural effusions and compressive atelectasis of the lower lobes RIGHT greater than LEFT. Additional bronchitic changes with subsegmental atelectasis at RIGHT middle lobe. Minimal pericardial effusion and postsurgical changes of the mediastinum. Aortic Atherosclerosis (ICD10-I70.0). Electronically Signed   By: Ulyses Southward  M.D.   On: 06/26/2017 14:03   Dg Chest Port 1 View  Result Date: 06/26/2017 CLINICAL DATA:  Shortness of Breath EXAM: PORTABLE CHEST 1 VIEW COMPARISON:  June 21, 2017 FINDINGS: There is airspace consolidation in the right lower lobe with right pleural effusion, stable. There is a minimal left pleural effusion with mild left base atelectasis. Lungs elsewhere clear. The heart size and pulmonary vascularity are normal. The patient is status post coronary artery bypass grafting.  No adenopathy. There is aortic atherosclerosis. No bone lesions. IMPRESSION: Persistent airspace consolidation right base with right pleural effusion. Small left pleural effusion with left base atelectasis. Appearance stable compared to recent study. Stable cardiac silhouette. There is aortic atherosclerosis. Aortic Atherosclerosis (ICD10-I70.0). Electronically Signed   By: Bretta Bang III M.D.   On: 06/26/2017 11:22    Procedures Procedures (including critical care time) CRITICAL CARE Performed by: Arby Barrette   Total critical care time: 30 minutes  Critical care time was exclusive of separately billable procedures and treating other patients.  Critical care was necessary to treat or prevent imminent or life-threatening deterioration.  Critical care was time spent personally by me on the following activities: development of treatment plan with patient and/or surrogate as well as nursing, discussions with consultants, evaluation of patient's response to treatment, examination of patient, obtaining history from patient or surrogate, ordering and performing treatments and interventions, ordering and review of laboratory studies, ordering and review of radiographic studies, pulse oximetry and re-evaluation of patient's condition. Medications Ordered in ED Medications  sodium chloride 0.9 % bolus 500 mL (500 mLs Intravenous New Bag/Given 06/26/17 1411)  0.9 %  sodium chloride infusion (not administered)  aspirin  chewable tablet 324 mg (not administered)    Or  aspirin suppository 300 mg (not administered)  0.9 % NaCl with KCl 40 mEq / L  infusion (not administered)  potassium chloride (KLOR-CON) packet 20 mEq (not administered)  acetaZOLAMIDE (DIAMOX) injection 500 mg (not administered)  sodium chloride 0.9 % bolus 500 mL (0 mLs Intravenous Stopped 06/26/17 1206)  potassium chloride 10 mEq in 100 mL IVPB (0 mEq Intravenous Stopped 06/26/17 1326)  0.9 % NaCl with KCl 20 mEq/ L  infusion ( Intravenous New Bag/Given 06/26/17 1409)  potassium chloride SA (K-DUR,KLOR-CON) CR tablet 40 mEq (40 mEq Oral Given 06/26/17 1227)  iopamidol (ISOVUE-370) 76 % injection (100 mLs  Contrast Given 06/26/17 1337)     Initial Impression / Assessment and Plan / ED Course  I have reviewed the triage vital signs and the nursing notes.  Pertinent labs & imaging results that were available during my care of the patient were reviewed by me and considered in my medical decision making (see chart for details).    Consult: Reviewed with cardiothoracic surgeon Dr. Dorris Fetch.  He does advised to proceed with a CT PE study and plan for critical care admission.  At this time does not suspect acute complications of surgical intervention. Consult: Reviewed with critical care intensivist for admission.  At this time advises to hold planned BiPAP for CO2 retention and patient will be eminently assessed in the emergency department for admission.   Final Clinical Impressions(s) / ED Diagnoses   Final diagnoses:  Hyponatremia  Hypokalemia  Hypotension, unspecified hypotension type  COPD exacerbation (HCC)  Severe comorbid illness  Patient presents with multiple complex medical conditions.  She is status post CABG.  His chief complaint is weakness and shortness of breath.  She has pre-existing COPD which appears to be a component of today's presentation.  Patient also has had significant diuresis and now has severe hypokalemia and  hyponatremia likely resulting in the patient's severe weakness.  Potassium replacement and fluid resuscitation initiated in the emergency department.  Intensivist and cardiothoracic have been consulted.  ED Discharge Orders    None       Arby Barrette, MD 06/26/17 1439

## 2017-06-26 NOTE — H&P (Signed)
PULMONARY / CRITICAL CARE MEDICINE   Name: Samantha Dyer MRN: [id number] DOB: 05-16-1951    ADMISSION DATE:  06/26/2017   Chief Complaint: Diffuse weakness and lethargy.  HISTORY OF PRESENT ILLNESS:   Is a 66 year old who is status post coronary artery bypass grafting on 10/17.  She had marginal preoperative LV systolic function with an EF of 40-45% and was extensively diuresed postoperatively.  She presented to the emergency room today complaining of some dyspnea but primarily of diffuse weakness and lethargy.  She was found to have a sodium of 116 potassium of 2.4 and a bicarbonate of 49.  Into the low sodium she has a history of COPD and is intermittently received prednisone in the past she is not aware of when she last received that agent.  She has no known history of thyroid disease.  He does have some PND but states that it is not getting worse.  She does have some minimal dependent edema but this too is no worse than usual.  Preoperative course was otherwise remarkable for a positive hit antibody but the serotonin release assay was negative.  PAST MEDICAL HISTORY :  She  has a past medical history of Allergic rhinitis, cause unspecified, Anemia, unspecified, Backache, unspecified, CAD (coronary artery disease), COPD (chronic obstructive pulmonary disease) (HCC), Esophageal reflux, and Iron deficiency anemia, unspecified.  PAST SURGICAL HISTORY: She  has a past surgical history that includes CORONARY ARTERY BYPASS GRAFTING x 4 USING LEFT INTERNAL MAMMARY ARTERY AND RIGHT GREATER SAPHENOUS VEIN HARVESTED ENDOSCOPICALLY -LIMA to LAD -SVG to PDA -SEQ SVG to RAMUS 1 and RAMUS 2 (N/A, 06/06/2017); TRANSESOPHAGEAL ECHOCARDIOGRAM (TEE) (N/A, 06/06/2017); and RIGHT/LEFT HEART CATH AND CORONARY ANGIOGRAPHY (N/A, 06/01/2017).  Allergies  Allergen Reactions  . Amoxicillin-Pot Clavulanate Swelling  . Augmentin [Amoxicillin-Pot Clavulanate] Swelling    No current facility-administered medications  on file prior to encounter.    Current Outpatient Medications on File Prior to Encounter  Medication Sig  . acetaminophen (TYLENOL) 325 MG tablet Take 650 mg by mouth every 6 (six) hours as needed for mild pain.  Marland Kitchen. amiodarone (PACERONE) 400 MG tablet Take 1 tablet (400 mg total) daily by mouth.  Marland Kitchen. aspirin EC 81 MG EC tablet Take 1 tablet (81 mg total) by mouth daily.  Marland Kitchen. atorvastatin (LIPITOR) 80 MG tablet Take 1 tablet (80 mg total) by mouth daily at 6 PM.  . COMBIVENT RESPIMAT 20-100 MCG/ACT AERS respimat Inhale 1 puff into the lungs 4 (four) times daily.   . ferrous gluconate (FERGON) 324 MG tablet Take 1 tablet (324 mg total) by mouth 2 (two) times daily with a meal.  . fluticasone furoate-vilanterol (BREO ELLIPTA) 200-25 MCG/INH AEPB Inhale 1 puff into the lungs daily.  . furosemide (LASIX) 40 MG tablet Take 1 tablet (40 mg total) by mouth 2 (two) times daily. For 7 Days, then decrease to 40 mg daily (Patient taking differently: Take 40 mg daily by mouth. )  . metolazone (ZAROXOLYN) 5 MG tablet Take 5 mg daily by mouth.  . metoprolol tartrate (LOPRESSOR) 25 MG tablet Take 1 tablet (25 mg total) by mouth 2 (two) times daily.  . OXYGEN Inhale 2 L into the lungs continuous.  . potassium chloride SA (K-DUR,KLOR-CON) 20 MEQ tablet Take 1 tablet (20 mEq total) by mouth 2 (two) times daily. For 7 Days, then decrease to 20 mg daily (Patient taking differently: Take 20 mEq daily by mouth. )  . tiotropium (SPIRIVA HANDIHALER) 18 MCG inhalation capsule inhale contents of one  capsule once daily  . traMADol (ULTRAM) 50 MG tablet Take 1 tablet (50 mg total) by mouth every 4 (four) hours as needed for moderate pain.  . fluticasone furoate-vilanterol (BREO ELLIPTA) 200-25 MCG/INH AEPB Inhale 1 puff into the lungs daily. (Patient not taking: Reported on 06/26/2017)  . guaiFENesin (MUCINEX) 600 MG 12 hr tablet Take 1 tablet (600 mg total) by mouth 2 (two) times daily as needed for to loosen phlegm. (Patient not  taking: Reported on 06/26/2017)  . predniSONE (DELTASONE) 10 MG tablet Prednisone dosing: Take  Prednisone 40mg  (4 tabs) x 3 days, then taper to 30mg  (3 tabs) x 3 days, then 20mg  (2 tabs) x 3days, then 10mg  (1 tab) x 3days, then OFF. (Patient not taking: Reported on 06/26/2017)  . Tiotropium Bromide Monohydrate (SPIRIVA RESPIMAT) 2.5 MCG/ACT AERS Inhale 2 puffs into the lungs daily. (Patient not taking: Reported on 06/26/2017)    FAMILY HISTORY:  Her indicated that the status of her mother is unknown. She indicated that the status of her father is unknown.   SOCIAL HISTORY: She  reports that she has been smoking cigarettes.  She has a 43.00 pack-year smoking history. she has never used smokeless tobacco. She reports that she does not drink alcohol or use drugs.  REVIEW OF SYSTEMS:   She is fairly lethargic and it is difficult to obtain history.  10 system review of systems contributes in addition that she has had some difficulty with glucose intolerance in the past.  She has no history of CVA transient neurological deficits or seizures does have chronic lung disease at baseline for which she wears at bedtime oxygen.  SUBJECTIVE:  As Above  VITAL SIGNS: BP (!) 82/45   Pulse 69   Temp (!) 96.8 F (36 C) (Axillary)   Resp 13   SpO2 95%     INTAKE / OUTPUT: No intake/output data recorded.  PHYSICAL EXAMINATION: General: This is a somewhat confused elderly white female with numerous ecchymoses. Neuro: She is oriented to person place and date.  Pupils are equal and she is moving all fours Cardiovascular: He does not have overt JVD, S1 and S2 are regular and somewhat distant without murmur rub or gallop, there is 1+ dependent edema symmetrically Lungs: Aspirations are unlabored, there is symmetric air movement and scattered rhonchi.  No wheezes. Abdomen: Abdomen is soft and nontender without any overt organomegaly masses guarding tenderness or rebound.   LABS:  BMET Recent Labs  Lab  06/26/17 1045  NA 116*  K 2.4*  CL <65*  CO2 49*  BUN 6  CREATININE 0.79  GLUCOSE 125*    Electrolytes Recent Labs  Lab 06/26/17 1045  CALCIUM 8.2*    CBC Recent Labs  Lab 06/26/17 1045  WBC 3.1*  HGB 9.2*  HCT 26.5*  PLT 169    Coag's Recent Labs  Lab 06/26/17 1045  INR 1.06    Sepsis Markers Recent Labs  Lab 06/26/17 1051 06/26/17 1143  LATICACIDVEN 0.63 1.30    ABG Recent Labs  Lab 06/26/17 1054  PHART 7.536*  PCO2ART 75.3*  PO2ART 61*    Liver Enzymes Recent Labs  Lab 06/26/17 1045  AST 22  ALT 13*  ALKPHOS 117  BILITOT 1.5*  ALBUMIN 3.3*    Cardiac Enzymes No results for input(s): TROPONINI, PROBNP in the last 168 hours.  Glucose No results for input(s): GLUCAP in the last 168 hours.  Imaging Ct Angio Chest Pe W And/or Wo Contrast  Result Date: 06/26/2017 CLINICAL  DATA:  Shortness of breath for 1 week, recent CABG on 06/05/2017, lower extremity swelling, question pulmonary embolism with high pretest probability, history COPD, coronary disease, smoker EXAM: CT ANGIOGRAPHY CHEST WITH CONTRAST TECHNIQUE: Multidetector CT imaging of the chest was performed using the standard protocol during bolus administration of intravenous contrast. Multiplanar CT image reconstructions and MIPs were obtained to evaluate the vascular anatomy. CONTRAST:  100 cc Isovue 370 IV COMPARISON:  09/05/2015 FINDINGS: Cardiovascular: Atherosclerotic calcifications aorta, coronary arteries and proximal great vessels. Aorta normal caliber without aneurysm or dissection. Minimal pericardial effusion. Pulmonary arteries well opacified and patent. No evidence pulmonary embolism. Mediastinum/Nodes: No esophageal abnormalities. Prior median sternotomy. Minimal stranding in the anterior mediastinum consistent recent surgery. Base of cervical region unremarkable. No thoracic adenopathy. Lungs/Pleura: BILATERAL pleural effusions small on LEFT and moderate on RIGHT. Compressive  atelectasis of the lower lobes. Subsegmental atelectasis RIGHT middle lobe. Additional mild atelectasis along RIGHT major fissure. Center peribronchial thickening. No pulmonary infiltrate or pneumothorax. Upper Abdomen: Visualized upper abdomen unremarkable Musculoskeletal: No acute osseous findings. Review of the MIP images confirms the above findings. IMPRESSION: No evidence of pulmonary embolism. BILATERAL pleural effusions and compressive atelectasis of the lower lobes RIGHT greater than LEFT. Additional bronchitic changes with subsegmental atelectasis at RIGHT middle lobe. Minimal pericardial effusion and postsurgical changes of the mediastinum. Aortic Atherosclerosis (ICD10-I70.0). Electronically Signed   By: Ulyses Southward M.D.   On: 06/26/2017 14:03   Dg Chest Port 1 View  Result Date: 06/26/2017 CLINICAL DATA:  Shortness of Breath EXAM: PORTABLE CHEST 1 VIEW COMPARISON:  June 21, 2017 FINDINGS: There is airspace consolidation in the right lower lobe with right pleural effusion, stable. There is a minimal left pleural effusion with mild left base atelectasis. Lungs elsewhere clear. The heart size and pulmonary vascularity are normal. The patient is status post coronary artery bypass grafting. No adenopathy. There is aortic atherosclerosis. No bone lesions. IMPRESSION: Persistent airspace consolidation right base with right pleural effusion. Small left pleural effusion with left base atelectasis. Appearance stable compared to recent study. Stable cardiac silhouette. There is aortic atherosclerosis. Aortic Atherosclerosis (ICD10-I70.0). Electronically Signed   By: Bretta Bang III M.D.   On: 06/26/2017 11:22     STUDIES:  CTA of the chest was negative for pulmonary embolism  DISCUSSION: Is a 66 year old with baseline COPD who presents with lethargy and diffuse weakness a little over 2 weeks status post coronary artery bypass grafting.  Been extensively diuresed and is found to have a  remarkably low sodium of 116 potassium 2.4 and a bicarbonate of 49.  ASSESSMENT / PLAN:  PULMONARY A: Does have COPD at baseline and I will continue her on inhaled bronchodilators.  Is been treated with systemic steroids in the past I am concerned that relative adrenal insufficiency may be contributing to her hyponatremia, if her random cortisol is low I will be doing a stim test.  She is appropriately CO2 retaining at present in response to her elevated bicarbonate I suspect that that will correct as we correct her bicarb.   CARDIOVASCULAR A: Somewhat compromised systolic function preoperatively, I will be obtaining an echocardiogram in order to ensure that part of her hyponatremia is not due to worsening LV function.    ENDOCRINE/metabolic he has impressive hyponatremia which I suspect is entirely on the basis of diuresis however the differential diagnosis in this quite case is quite broad.  Have ordered a serum osmolality to demonstrate that this is real hyponatremia.  Serum cortisol and TSH have  also been ordered.  We will be slowly correcting her hyponatremia with the infusion of normal saline as we simultaneously correct her hypokalemia.  Given her a single dose of Diamox to correct to correct her very high bicarbonate.  She does have a history of glucose intolerance in the past and sliding scale insulin has been ordered.  Greater than 32 minutes was spent in the care of this patient today    Pulmonary and Critical Care Medicine [facility] Pager: [phone]  06/26/2017, 3:08 PM

## 2017-06-27 ENCOUNTER — Other Ambulatory Visit: Payer: Self-pay

## 2017-06-27 ENCOUNTER — Inpatient Hospital Stay (HOSPITAL_COMMUNITY): Payer: Medicare Other

## 2017-06-27 DIAGNOSIS — L8990 Pressure ulcer of unspecified site, unspecified stage: Secondary | ICD-10-CM

## 2017-06-27 DIAGNOSIS — E871 Hypo-osmolality and hyponatremia: Principal | ICD-10-CM

## 2017-06-27 DIAGNOSIS — I361 Nonrheumatic tricuspid (valve) insufficiency: Secondary | ICD-10-CM

## 2017-06-27 DIAGNOSIS — J90 Pleural effusion, not elsewhere classified: Secondary | ICD-10-CM

## 2017-06-27 DIAGNOSIS — Z9889 Other specified postprocedural states: Secondary | ICD-10-CM

## 2017-06-27 LAB — GLUCOSE, CAPILLARY
GLUCOSE-CAPILLARY: 97 mg/dL (ref 65–99)
GLUCOSE-CAPILLARY: 108 mg/dL — AB (ref 65–99)
GLUCOSE-CAPILLARY: 109 mg/dL — AB (ref 65–99)

## 2017-06-27 LAB — BASIC METABOLIC PANEL
ANION GAP: 12 (ref 5–15)
Anion gap: 8 (ref 5–15)
BUN: 6 mg/dL (ref 6–20)
BUN: 5 mg/dL — ABNORMAL LOW (ref 6–20)
BUN: 6 mg/dL (ref 6–20)
CALCIUM: 7.8 mg/dL — AB (ref 8.9–10.3)
CALCIUM: 8 mg/dL — AB (ref 8.9–10.3)
CHLORIDE: 72 mmol/L — AB (ref 101–111)
CO2: 41 mmol/L — ABNORMAL HIGH (ref 22–32)
CO2: 36 mmol/L — AB (ref 22–32)
CO2: 34 mmol/L — ABNORMAL HIGH (ref 22–32)
CREATININE: 0.99 mg/dL (ref 0.44–1.00)
CREATININE: 0.81 mg/dL (ref 0.44–1.00)
CREATININE: 0.72 mg/dL (ref 0.44–1.00)
Calcium: 8 mg/dL — ABNORMAL LOW (ref 8.9–10.3)
Chloride: <65 mmol/L — CL (ref 101–111)
Chloride: 79 mmol/L — ABNORMAL LOW (ref 101–111)
GFR calc Af Amer: >60 mL/min (ref >60)
GFR calc non Af Amer: 59 mL/min — ABNORMAL LOW (ref >60)
GFR calc non Af Amer: >60 mL/min (ref >60)
GLUCOSE: 124 mg/dL — AB (ref 65–99)
GLUCOSE: 84 mg/dL (ref 65–99)
Glucose, Bld: 114 mg/dL — ABNORMAL HIGH (ref 65–99)
Potassium: 3 mmol/L — ABNORMAL LOW (ref 3.5–5.1)
Potassium: 3.8 mmol/L (ref 3.5–5.1)
Potassium: 5 mmol/L (ref 3.5–5.1)
SODIUM: 121 mmol/L — AB (ref 135–145)
Sodium: 118 mmol/L — CL (ref 135–145)
Sodium: 120 mmol/L — ABNORMAL LOW (ref 135–145)

## 2017-06-27 LAB — MAGNESIUM: Magnesium: 1.5 mg/dL — ABNORMAL LOW (ref 1.7–2.4)

## 2017-06-27 LAB — ECHOCARDIOGRAM COMPLETE: WEIGHTICAEL: 2081.14 [oz_av]

## 2017-06-27 LAB — BODY FLUID CELL COUNT WITH DIFFERENTIAL
EOS FL: 0 %
LYMPHS FL: 86 %
MONOCYTE-MACROPHAGE-SEROUS FLUID: 11 % — AB (ref 50–90)
Neutrophil Count, Fluid: 3 % (ref 0–25)
Other Cells, Fluid: 0 %
WBC FLUID: UNDETERMINED uL (ref 0–1000)

## 2017-06-27 LAB — CBC
HEMATOCRIT: 25.8 % — AB (ref 36.0–46.0)
HEMOGLOBIN: 9.1 g/dL — AB (ref 12.0–15.0)
MCH: 30.4 pg (ref 26.0–34.0)
MCHC: 35.3 g/dL (ref 30.0–36.0)
MCV: 86.3 fL (ref 78.0–100.0)
Platelets: 160 10*3/uL (ref 150–400)
RBC: 2.99 MIL/uL — ABNORMAL LOW (ref 3.87–5.11)
RDW: 14.7 % (ref 11.5–15.5)
WBC: 5.3 10*3/uL (ref 4.0–10.5)

## 2017-06-27 LAB — PHOSPHORUS: PHOSPHORUS: 2.7 mg/dL (ref 2.5–4.6)

## 2017-06-27 LAB — LACTATE DEHYDROGENASE: LDH: 175 U/L (ref 98–192)

## 2017-06-27 LAB — PROTEIN, TOTAL: Total Protein: 5.1 g/dL — ABNORMAL LOW (ref 6.5–8.1)

## 2017-06-27 LAB — GLUCOSE, PLEURAL OR PERITONEAL FLUID: GLUCOSE FL: 109 mg/dL

## 2017-06-27 MED ORDER — SODIUM CHLORIDE 0.9 % IV SOLN
INTRAVENOUS | Status: DC
  Administered 2017-06-27: 16:00:00 via INTRAVENOUS

## 2017-06-27 MED ORDER — IPRATROPIUM-ALBUTEROL 20-100 MCG/ACT IN AERS: 1.0000 | INHALATION_SPRAY | Freq: Four times a day (QID) | RESPIRATORY_TRACT | Status: DC | PRN

## 2017-06-27 MED ORDER — IPRATROPIUM-ALBUTEROL 0.5-2.5 (3) MG/3ML IN SOLN: 3.0000 mL | Freq: Four times a day (QID) | RESPIRATORY_TRACT | Status: DC | PRN

## 2017-06-27 MED ORDER — AMIODARONE HCL 200 MG PO TABS
400.0000 mg | ORAL_TABLET | Freq: Every day | ORAL | Status: DC
  Administered 2017-06-27 – 2017-07-05 (×9): 400 mg via ORAL
  Filled 2017-06-27 (×9): qty 2

## 2017-06-27 MED ORDER — INFLUENZA VAC SPLIT HIGH-DOSE 0.5 ML IM SUSY
0.5000 mL | PREFILLED_SYRINGE | INTRAMUSCULAR | Status: AC
  Administered 2017-06-28: 0.5 mL via INTRAMUSCULAR
  Filled 2017-06-27: qty 0.5

## 2017-06-27 MED ORDER — FLUTICASONE FUROATE-VILANTEROL 200-25 MCG/INH IN AEPB
1.0000 | INHALATION_SPRAY | Freq: Every day | RESPIRATORY_TRACT | Status: DC
  Administered 2017-06-27 – 2017-07-05 (×9): 1 via RESPIRATORY_TRACT
  Filled 2017-06-27 (×2): qty 28

## 2017-06-27 MED ORDER — FERROUS GLUCONATE 324 (38 FE) MG PO TABS
324.0000 mg | ORAL_TABLET | Freq: Two times a day (BID) | ORAL | Status: DC
  Administered 2017-06-27 – 2017-07-05 (×15): 324 mg via ORAL
  Filled 2017-06-27 (×17): qty 1

## 2017-06-27 MED ORDER — METOPROLOL TARTRATE 25 MG PO TABS
25.0000 mg | ORAL_TABLET | Freq: Two times a day (BID) | ORAL | Status: DC
  Administered 2017-06-27: 25 mg via ORAL
  Filled 2017-06-27: qty 1

## 2017-06-27 MED ORDER — ATORVASTATIN CALCIUM 80 MG PO TABS
80.0000 mg | ORAL_TABLET | Freq: Every day | ORAL | Status: DC
  Administered 2017-06-27 – 2017-07-04 (×8): 80 mg via ORAL
  Filled 2017-06-27 (×9): qty 1

## 2017-06-27 MED ORDER — TIOTROPIUM BROMIDE MONOHYDRATE 18 MCG IN CAPS
18.0000 ug | ORAL_CAPSULE | Freq: Every day | RESPIRATORY_TRACT | Status: DC
  Administered 2017-06-27 – 2017-07-05 (×9): 18 ug via RESPIRATORY_TRACT
  Filled 2017-06-27 (×3): qty 5

## 2017-06-27 NOTE — Telephone Encounter (Signed)
FYI TP 

## 2017-06-27 NOTE — Telephone Encounter (Signed)
LMTCB for the pt 

## 2017-06-27 NOTE — Progress Notes (Addendum)
Name: Samantha Dyer MRN: [id number] DOB: Sep 27, 1950    ADMISSION DATE:  06/26/2017 CONSULTATION DATE: 11/6  REFERRING MD : Clarice PolePfeifer  CHIEF COMPLAINT: Hyponatremia  BRIEF PATIENT DESCRIPTION:  This is a 66 year old white female with a known history of chronic obstructive pulmonary disease and recent coronary artery bypass grafting surgery on 10/17.  Presented to the emergency room on 10/6 with chief complaint of weakness and lethargy.  Diagnostic evaluation in the emergency room yielded a serum sodium of 116, and a potassium of 2.4 with a bicarbonate of 49.  Apparently her postop course was remarkable for aggressive diuresis in the setting of depressed ejection fraction of 40-45%.  Her serum bicarbonate was noted to be 49 on presentation she was admitted to the intensive care due to her severe metabolic disarray.  SIGNIFICANT EVENTS    STUDIES:  BNP: 484 Cortisol 23.2 Serum osmole 242  SUBJECTIVE:  Feels much better  VITAL SIGNS: Temp:  [96.8 F (36 C)-98.9 F (37.2 C)] 97.9 F (36.6 C) (11/07 0810) Pulse Rate:  [60-83] 68 (11/07 0800) Resp:  [9-31] 14 (11/07 0800) BP: (75-167)/(32-101) 127/86 (11/07 0800) SpO2:  [89 %-100 %] 100 % (11/07 0800) Weight:  [130 lb 1.1 oz (59 kg)] 130 lb 1.1 oz (59 kg) (11/06 1545)  PHYSICAL EXAMINATION: General: 66 year old white female sitting up in chair, no acute distress, awake and oriented, denies any complaints. HEENT: She has a small half dollar sized abrasion on right forehead, her mucous membranes are moist, she has no jugular venous distention. Pulmonary: Clear to auscultation without accessory muscle use Cardiac: Regular rate and rhythm without murmur rub or gallop.  Surgical incision site well approximated but ecchymotic Extremities: No significant edema, warm dry, scattered areas of ecchymosis Neuro: Awake oriented no focal motor deficits  Recent Labs  Lab 06/26/17 1045 06/26/17 1932 06/27/17 0331  NA 116* 118* 120*  K  2.4* 3.0* 3.8  CL <65* <65* 72*  CO2 49* 41* 36*  BUN 6 6 5*  CREATININE 0.79 0.99 0.81  GLUCOSE 125* 124* 84   Recent Labs  Lab 06/26/17 1045 06/27/17 0331  HGB 9.2* 9.1*  HCT 26.5* 25.8*  WBC 3.1* 5.3  PLT 169 160   Ct Angio Chest Pe W And/or Wo Contrast  Result Date: 06/26/2017 CLINICAL DATA:  Shortness of breath for 1 week, recent CABG on 06/05/2017, lower extremity swelling, question pulmonary embolism with high pretest probability, history COPD, coronary disease, smoker EXAM: CT ANGIOGRAPHY CHEST WITH CONTRAST TECHNIQUE: Multidetector CT imaging of the chest was performed using the standard protocol during bolus administration of intravenous contrast. Multiplanar CT image reconstructions and MIPs were obtained to evaluate the vascular anatomy. CONTRAST:  100 cc Isovue 370 IV COMPARISON:  09/05/2015 FINDINGS: Cardiovascular: Atherosclerotic calcifications aorta, coronary arteries and proximal great vessels. Aorta normal caliber without aneurysm or dissection. Minimal pericardial effusion. Pulmonary arteries well opacified and patent. No evidence pulmonary embolism. Mediastinum/Nodes: No esophageal abnormalities. Prior median sternotomy. Minimal stranding in the anterior mediastinum consistent recent surgery. Base of cervical region unremarkable. No thoracic adenopathy. Lungs/Pleura: BILATERAL pleural effusions small on LEFT and moderate on RIGHT. Compressive atelectasis of the lower lobes. Subsegmental atelectasis RIGHT middle lobe. Additional mild atelectasis along RIGHT major fissure. Center peribronchial thickening. No pulmonary infiltrate or pneumothorax. Upper Abdomen: Visualized upper abdomen unremarkable Musculoskeletal: No acute osseous findings. Review of the MIP images confirms the above findings. IMPRESSION: No evidence of pulmonary embolism. BILATERAL pleural effusions and compressive atelectasis of the lower lobes RIGHT greater  than LEFT. Additional bronchitic changes with  subsegmental atelectasis at RIGHT middle lobe. Minimal pericardial effusion and postsurgical changes of the mediastinum. Aortic Atherosclerosis (ICD10-I70.0). Electronically Signed   By: Ulyses SouthwardMark  Boles M.D.   On: 06/26/2017 14:03   Dg Chest Port 1 View  Result Date: 06/26/2017 CLINICAL DATA:  Shortness of Breath EXAM: PORTABLE CHEST 1 VIEW COMPARISON:  June 21, 2017 FINDINGS: There is airspace consolidation in the right lower lobe with right pleural effusion, stable. There is a minimal left pleural effusion with mild left base atelectasis. Lungs elsewhere clear. The heart size and pulmonary vascularity are normal. The patient is status post coronary artery bypass grafting. No adenopathy. There is aortic atherosclerosis. No bone lesions. IMPRESSION: Persistent airspace consolidation right base with right pleural effusion. Small left pleural effusion with left base atelectasis. Appearance stable compared to recent study. Stable cardiac silhouette. There is aortic atherosclerosis. Aortic Atherosclerosis (ICD10-I70.0). Electronically Signed   By: Bretta BangWilliam  Woodruff III M.D.   On: 06/26/2017 11:22    ASSESSMENT / PLAN:  Hypoosmolar hyponatremia No urine studies were sent, however urine spec gravity was elevated, and she is responded to gentle IV hydration with appropriate sodium rise from initially 116 up to 120 this a.m. Plan Continue gentle IV hydration Hold diuresis Follow-up afternoon sodium as well as a.m. sodium.  She is clinically clear to leave the intensive care  History of paroxysmal atrial fibrillation Plan Continue amiodarone and metoprolol No need for telemetry at this point.  Admitted in normal sinus rhythm and has maintained this for 24 hours  Hypokalemia, now resolved Plan Follow-up a.m. chemistry  History of chronic obstructive pulmonary disease.  Currently no evidence of acute exacerbation. Plan Continue home bronchodilators, continue oxygen at 2 L per home  History of  coronary artery disease, recent CABG, with chronic systolic heart failure.  Currently no evidence of acute heart failure Plan Follow-up echo Continuing aspirin, holding diuretics Continue Lopressor and amiodarone  Anemia Plan Continue iron supplementation  Discussion: 66 year old white female status post recent coronary artery bypass grafting with postop EF reduced.  Presents with weakness and lethargy in the setting of severe hyponatremia.  Suspect this is primarily due to over diuresis given her appropriate response to gentle IV hydration.  She is now stable for transfer out of the intensive care, we will continue her home medication regimen, however need to continue to hold off on diuretics.  We will repeat a chemistry this afternoon and again in the morning, will need to see how she does over the next 48 hours to determine appropriate diuretic regimen.  She is stable to move out of the intensive care she can be transferred to the MedSurg unit.  Simonne MartinetPeter E Sowmya Partridge ACNP-BC [facility] Pulmonary/Critical Care Pager # [phone] OR # [phone] if no answer   06/27/2017, 9:11 AM

## 2017-06-27 NOTE — Procedures (Signed)
Thoracentesis Procedure Note  Pre-operative Diagnosis: right pleural effusion   Post-operative Diagnosis: right exudative effusion, suspect post-thoracotomy. Has post thoracentesis pressure dependent pleuro-parenchymal fistula   Indications: evaluation of pleural fluid   Procedure Details  Consent: Informed consent was obtained. Risks of the procedure were discussed including: infection, bleeding, pain, pneumothorax.  Under sterile conditions the patient was positioned. Betadine solution and sterile drapes were utilized.  1% buffered lidocaine was used to anesthetize the  rib space which was identified via real time US. Fluid was obtained without any difficulties and minimal blood loss.  A dressing was applied to the wound and wound care instructions were provided.   Findings 500 ml of bloody pleural fluid was obtained. A sample was sent to Pathology for cytogenetics, flow, and cell counts, as well as for infection analysis.  Complications:  None; patient tolerated the procedure well.          Condition: stable  Plan A follow up chest x-ray was ordered. Bed Rest for 0 hours. Tylenol 650 mg. for pain.  Simonne MartinetPeter E Henny Strauch ACNP-BC [facility] Pulmonary[facility]Critical Care Pager # [phone] OR # [phone] if no answer

## 2017-06-27 NOTE — Progress Notes (Signed)
  Echocardiogram 2D Echocardiogram has been performed.  Celene SkeenVijay  Sefora Tietje 06/27/2017, 10:34 AM

## 2017-06-27 NOTE — Telephone Encounter (Signed)
Pt's husband returned call and said that pt was back in hospital ([facility])-tr

## 2017-06-28 ENCOUNTER — Inpatient Hospital Stay (HOSPITAL_COMMUNITY): Payer: Medicare Other

## 2017-06-28 DIAGNOSIS — I480 Paroxysmal atrial fibrillation: Secondary | ICD-10-CM

## 2017-06-28 DIAGNOSIS — I5032 Chronic diastolic (congestive) heart failure: Secondary | ICD-10-CM

## 2017-06-28 DIAGNOSIS — D509 Iron deficiency anemia, unspecified: Secondary | ICD-10-CM

## 2017-06-28 LAB — GLUCOSE, CAPILLARY
GLUCOSE-CAPILLARY: 138 mg/dL — AB (ref 65–99)
Glucose-Capillary: 99 mg/dL (ref 65–99)
Glucose-Capillary: 132 mg/dL — ABNORMAL HIGH (ref 65–99)
Glucose-Capillary: 98 mg/dL (ref 65–99)
Glucose-Capillary: 122 mg/dL — ABNORMAL HIGH (ref 65–99)

## 2017-06-28 LAB — BASIC METABOLIC PANEL
ANION GAP: 7 (ref 5–15)
BUN: <5 mg/dL — ABNORMAL LOW (ref 6–20)
CO2: 34 mmol/L — ABNORMAL HIGH (ref 22–32)
Calcium: 8.1 mg/dL — ABNORMAL LOW (ref 8.9–10.3)
Chloride: 81 mmol/L — ABNORMAL LOW (ref 101–111)
Creatinine, Ser: 0.7 mg/dL (ref 0.44–1.00)
GLUCOSE: 91 mg/dL (ref 65–99)
POTASSIUM: 3.5 mmol/L (ref 3.5–5.1)
Sodium: 122 mmol/L — ABNORMAL LOW (ref 135–145)

## 2017-06-28 LAB — POTASSIUM: POTASSIUM: 4.1 mmol/L (ref 3.5–5.1)

## 2017-06-28 LAB — PHOSPHORUS: PHOSPHORUS: 2.3 mg/dL — AB (ref 2.5–4.6)

## 2017-06-28 LAB — MAGNESIUM: MAGNESIUM: 1.4 mg/dL — AB (ref 1.7–2.4)

## 2017-06-28 MED ORDER — SODIUM PHOSPHATES 45 MMOLE/15ML IV SOLN
40.0000 meq | Freq: Once | INTRAVENOUS | Status: AC
  Administered 2017-06-28: 40 meq via INTRAVENOUS
  Filled 2017-06-28: qty 10

## 2017-06-28 MED ORDER — MAGNESIUM SULFATE 2 GM/50ML IV SOLN
2.0000 g | Freq: Once | INTRAVENOUS | Status: AC
  Administered 2017-06-28: 2 g via INTRAVENOUS
  Filled 2017-06-28: qty 50

## 2017-06-28 MED ORDER — MONTELUKAST SODIUM 10 MG PO TABS
10.0000 mg | ORAL_TABLET | Freq: Every day | ORAL | Status: DC
  Administered 2017-06-28 – 2017-07-04 (×7): 10 mg via ORAL
  Filled 2017-06-28 (×8): qty 1

## 2017-06-28 MED ORDER — POTASSIUM CHLORIDE CRYS ER 20 MEQ PO TBCR
50.0000 meq | EXTENDED_RELEASE_TABLET | Freq: Once | ORAL | Status: AC
  Administered 2017-06-28: 50 meq via ORAL
  Filled 2017-06-28: qty 2

## 2017-06-28 NOTE — Progress Notes (Signed)
Name: Samantha Dyer MRN: [id number] DOB: 15-Nov-1950    ADMISSION DATE:  06/26/2017 CONSULTATION DATE: 11/6  REFERRING MD : Clarice PolePfeifer  CHIEF COMPLAINT: Hyponatremia  BRIEF PATIENT DESCRIPTION:  This is a 66 year old white female with a known history of chronic obstructive pulmonary disease and recent coronary artery bypass grafting surgery on 10/17.  Presented to the emergency room on 10/6 with chief complaint of weakness and lethargy.  Diagnostic evaluation in the emergency room yielded a serum sodium of 116, and a potassium of 2.4 with a bicarbonate of 49.  Apparently her postop course was remarkable for aggressive diuresis in the setting of depressed ejection fraction of 40-45%.  Her serum bicarbonate was noted to be 49 on presentation she was admitted to the intensive care due to her severe metabolic disarray.  SIGNIFICANT EVENTS    STUDIES:  BNP: 484 Cortisol 23.2 Serum osmole 242  11/7 right thoracentesis 500 mL; serum protein 5.1 serum LDH 175 Pleural fluid culture>>> Cytology>>. Pleural glucose 109, pleural cell count red, turbid, white blood cells unable to perform.  CT chest obtained 11/7: Moderate right pleural effusion at right lung base.  Incomplete reexpansion of the right middle and lower lobes.  No mediastinal shift.  Small bilateral pleural effusions.  Cardiomegaly.  SUBJECTIVE:  Says she feels better  VITAL SIGNS: Temp:  [97.4 F (36.3 C)-98.6 F (37 C)] 97.9 F (36.6 C) (11/08 0400) Pulse Rate:  [56-76] 72 (11/08 0900) Resp:  [9-26] 24 (11/08 0900) BP: (89-117)/(50-76) 90/58 (11/08 0900) SpO2:  [98 %-100 %] 100 % (11/08 0900) Weight:  [130 lb 1.1 oz (59 kg)] 130 lb 1.1 oz (59 kg) (11/08 0409)  Intake/Output Summary (Last 24 hours) at 06/28/2017 1008 Last data filed at 06/28/2017 0900 Gross per 24 hour  Intake 1150 ml  Output 801 ml  Net 349 ml    PHYSICAL EXAMINATION: General: This is a pleasant 66 year old white female sitting upright in bed  she is in no distress HEENT: Normocephalic atraumatic no jugular venous distention Pulmonary: Clear to auscultation without accessory muscle use Cardiac: Regular rate and rhythm without murmur rub or gallop Extremity/musculoskeletal: Warm, dry, no edema, brisk cap refill strong pulses Abdomen: Soft, nontender, no organomegaly, good appetite. Neuro/psych: Awake, alert, moves all extremities.  Flat affect.  Recent Labs  Lab 06/27/17 0331 06/27/17 1232 06/28/17 0301  NA 120* 121* 122*  K 3.8 5.0 3.5  CL 72* 79* 81*  CO2 36* 34* 34*  BUN 5* 6 <5*  CREATININE 0.81 0.72 0.70  GLUCOSE 84 114* 91   Recent Labs  Lab 06/26/17 1045 06/27/17 0331  HGB 9.2* 9.1*  HCT 26.5* 25.8*  WBC 3.1* 5.3  PLT 169 160   Ct Chest Wo Contrast  Result Date: 06/27/2017 CLINICAL DATA:  Status post thoracentesis with pressure dependent pleuroparenchymal fistula. No pneumothorax. EXAM: CT CHEST WITHOUT CONTRAST TECHNIQUE: Multidetector CT imaging of the chest was performed following the standard protocol without IV contrast. COMPARISON:  None. FINDINGS: Cardiovascular: Stable cardiomegaly with small pericardial effusion or thickening. Left main and three-vessel dense coronary arteriosclerosis is noted with aortic atherosclerosis and atherosclerosis of the proximal great vessels. Patient is status post CABG. Shallow left lateral aneurysmal bulging of the anterior aortic arch, measuring 0.5 cm in depth by 1.5 cm in length, unchanged in appearance. Mediastinum/Nodes: No esophageal abnormality. No mediastinal shift. Patent trachea and mainstem bronchi. Lungs/Pleura: The patient has undergone thoracentesis of moderate to large right pleural effusion. Residual 1.7 cm in thickness right and 1.4  cm in thickness left small bilateral pleural effusions are noted with incomplete re-expansion of right middle and lower lobes. Findings likely represent a pneumothorax ex vacuo at the right lung base from evacuation of chronic  pleural effusion. The ex vacuo pneumothorax measures 2.4 cm in thickness along the anterior aspect of the right upper lobe cannot 6.8 cm in thickness on coronal reformats at the right lung base. Compressive atelectasis is seen at the left lung base. Upper Abdomen: No acute abnormality. Musculoskeletal: No acute nor suspicious osseous abnormalities. IMPRESSION: 1. Status post thoracentesis of moderate right pleural effusion from the right lung base. 2. There is resultant incomplete re-expansion of the right middle and lower lobes. Given this appearance, findings likely represent a pneumothorax ex vacuo. No mediastinal shift is noted. 3. There are small bilateral pleural effusions as above described measuring up to 1.7 cm in thickness on the right and 1.4 cm in thickness on the left, both at the lung bases posteriorly. 4. Cardiomegaly with trace pericardial effusion, stable in appearance with post CABG change. 5. Moderate aortic atherosclerosis and atherosclerosis of the great vessels. Slight left lateral aneurysmal bulging of the aortic arch measuring 1.5 cm in length by 0.5 cm in depth also stable. Aortic Atherosclerosis (ICD10-I70.0). Electronically Signed   By: Tollie Ethavid  Kwon M.D.   On: 06/27/2017 20:15   Ct Angio Chest Pe W And/or Wo Contrast  Result Date: 06/26/2017 CLINICAL DATA:  Shortness of breath for 1 week, recent CABG on 06/05/2017, lower extremity swelling, question pulmonary embolism with high pretest probability, history COPD, coronary disease, smoker EXAM: CT ANGIOGRAPHY CHEST WITH CONTRAST TECHNIQUE: Multidetector CT imaging of the chest was performed using the standard protocol during bolus administration of intravenous contrast. Multiplanar CT image reconstructions and MIPs were obtained to evaluate the vascular anatomy. CONTRAST:  100 cc Isovue 370 IV COMPARISON:  09/05/2015 FINDINGS: Cardiovascular: Atherosclerotic calcifications aorta, coronary arteries and proximal great vessels. Aorta normal  caliber without aneurysm or dissection. Minimal pericardial effusion. Pulmonary arteries well opacified and patent. No evidence pulmonary embolism. Mediastinum/Nodes: No esophageal abnormalities. Prior median sternotomy. Minimal stranding in the anterior mediastinum consistent recent surgery. Base of cervical region unremarkable. No thoracic adenopathy. Lungs/Pleura: BILATERAL pleural effusions small on LEFT and moderate on RIGHT. Compressive atelectasis of the lower lobes. Subsegmental atelectasis RIGHT middle lobe. Additional mild atelectasis along RIGHT major fissure. Center peribronchial thickening. No pulmonary infiltrate or pneumothorax. Upper Abdomen: Visualized upper abdomen unremarkable Musculoskeletal: No acute osseous findings. Review of the MIP images confirms the above findings. IMPRESSION: No evidence of pulmonary embolism. BILATERAL pleural effusions and compressive atelectasis of the lower lobes RIGHT greater than LEFT. Additional bronchitic changes with subsegmental atelectasis at RIGHT middle lobe. Minimal pericardial effusion and postsurgical changes of the mediastinum. Aortic Atherosclerosis (ICD10-I70.0). Electronically Signed   By: Ulyses SouthwardMark  Boles M.D.   On: 06/26/2017 14:03   Dg Chest Port 1 View  Result Date: 06/28/2017 CLINICAL DATA:  66 year old female with pneumothorax. EXAM: PORTABLE CHEST 1 VIEW COMPARISON:  X-ray and CT dated 06/27/2017. FINDINGS: Median sternotomy wires again noted. The cardiomediastinal silhouette is unchanged in size and morphology. Moderate to large right apical pneumothorax is again noted. There is an enlarging right basilar pleural effusion small left basilar effusion and associated atelectasis also identified. Superimposed consolidation difficult to exclude in this setting. No left-sided pneumothorax. No acute osseous abnormalities. IMPRESSION: 1. Continued moderate to large right apical pneumothorax with developing right basilar effusion and atelectasis, with or  without superimposed consolidation. 2. Small left basilar effusion  and associated atelectasis. Again, superimposed consolidation difficult to exclude. Electronically Signed   By: Sande Brothers M.D.   On: 06/28/2017 07:18   Dg Chest Portable 1 View  Result Date: 06/27/2017 CLINICAL DATA:  Status post thoracentesis. EXAM: PORTABLE CHEST 1 VIEW COMPARISON:  06/26/2017 CT and prior studies FINDINGS: Cardiomegaly and CABG changes noted. A moderate to large right pneumothorax is noted. Given CT findings, this may be secondary to unexpandable mid-lower lung. Apical pneumothorax could be be related to introduce air as no upper lung pleural thickening or abnormality is identified on CT. Right lower lung consolidation/atelectasis noted. Left basilar atelectasis again identified. IMPRESSION: Moderate to large right pneumothorax which may be in part due to unexpandable lung. Cardiomegaly, right lower lung consolidation/ atelectasis and left basilar atelectasis again noted. Critical Value/emergent results were called by telephone at the time of interpretation on 06/27/2017 at 4:01 pm to [facility], nurse for this [facility] , who verbally acknowledged these results. Electronically Signed   By: Harmon Pier M.D.   On: 06/27/2017 16:01   Dg Chest Port 1 View  Result Date: 06/26/2017 CLINICAL DATA:  Shortness of Breath EXAM: PORTABLE CHEST 1 VIEW COMPARISON:  June 21, 2017 FINDINGS: There is airspace consolidation in the right lower lobe with right pleural effusion, stable. There is a minimal left pleural effusion with mild left base atelectasis. Lungs elsewhere clear. The heart size and pulmonary vascularity are normal. The patient is status post coronary artery bypass grafting. No adenopathy. There is aortic atherosclerosis. No bone lesions. IMPRESSION: Persistent airspace consolidation right base with right pleural effusion. Small left pleural effusion with left base atelectasis. Appearance stable compared  to recent study. Stable cardiac silhouette. There is aortic atherosclerosis. Aortic Atherosclerosis (ICD10-I70.0). Electronically Signed   By: Bretta Bang III M.D.   On: 06/26/2017 11:22    ASSESSMENT / PLAN:  Right exudative pleural effusion suspect related to postthoracotomy Right Ex vacuo Pneumothorax Hypoosmolar hyponatremia History of paroxysmal atrial fibrillation History of chronic obstructive pulmonary disease.  History of coronary artery disease, recent CABG, with chronic systolic heart  Anemia  Discussion: Sodium slowly improving with gentle saline replacement clinically she feels better.  We performed diagnostic/therapeutic right thoracentesis on 11/7 per the request of thoracic surgery.  The fluid analysis was consistent with exudative fluid collection.  Most likely reflects post thoracotomy changes.  Her right lung did not fully reexpand following thoracentesis.  This appears primarily to be related to pneumothorax ex vacuo, although post procedural pneumothorax is a possibility it seems unlikely as it is not grown in size, as well as she has been asymptomatic.  Plan Continue gentle saline resuscitation Continue supplemental oxygen We will repeat a chest x-ray this afternoon at 5 PM, could consider bedside smallbore anterior pleural catheter if apical pneumothorax enlarges, however given course and patient's symptoms doubt this will be needed  Simonne Martinet ACNP-BC [facility] Pulmonary/Critical Care Pager # [phone] OR # [phone] if no answer  06/28/2017, 10:00 AM

## 2017-06-28 NOTE — Progress Notes (Signed)
PROGRESS NOTE    Samantha Dyer  ZOX:[id number] DOB: [birth year] DOA: 06/26/2017 PCP: Marinda Elk, MD   Brief Narrative:  66 year old WF  PMHx Anemia, unspecified, Backache, unspecified,COPD, chronic respiratory failure with hypoxia on 2 L O2 daytime/2.5 L O2 at night, CAD, S/P CABG 10/17.  She had marginal preoperative LV systolic function with an EF of 40-45% and was extensively diuresed postoperatively.    She presented to the emergency room today complaining of some dyspnea but primarily of diffuse weakness and lethargy.  She was found to have a sodium of 116 potassium of 2.4 and a bicarbonate of 49.  Into the low sodium she has a history of COPD and is intermittently received prednisone in the past she is not aware of when she last received that agent.  She has no known history of thyroid disease.  He does have some PND but states that it is not getting worse.  She does have some minimal dependent edema but this too is no worse than usual.  Preoperative course was otherwise remarkable for a positive hit antibody but the serotonin release assay was negative.     Subjective: 11/8 A/O 4, negative CP, positive SOB, negative abdominal pain, negative N/V. States stopped smoking last hospitalization.,   Assessment & Plan:   Active Problems:   Hyponatremia   S/P thoracentesis   Pressure injury of skin  Hyponatremia -Asymptomatic currently -See hypophosphatemia  Hypokalemia -Potassium goal> 4 -K-Dur 50 mEq -Repeat K/Mg/PO4 @1200 : Low magnesium, low phosphate see below.  Hypomagnesemia  -Magnesium goal> 2 -Magnesium IV 2 g  Hypophosphatemia -Sodium phosphate 40 mEq   Acute on Chronic Respiratory Failure with Hypoxia -back to baseline. Now on 2 L O2 via   COPD severe -Breo 200- 25 micrograms 1 puff daily -Spiriva daily -DuoNeb PRN -Singulair 10 mg daily  Bilateral pleural effusions -Per [facility] suspect secondary to prior thoracic surgery. -11/7S/P RIGHT thoracentesis  see results below. Continued LEFT pleural effusion currently asymptomatic therefore would not perform another thoracentesis unless patient became symptomatic.  Chronic Diastolic CHF -Strict in and out -Daily weight  CAD  Paroxysmal atrial fibrillation -Currently in NSR -Amiodarone 400 mg daily  Iron deficiency anemia -Continue iron gluconate 324 mg BID     DVT prophylaxis: Subcutaneous heparin & SCD Code Status: Full Family Communication: Husband present at bedside for discussion of plan of care Disposition Plan: TBD   Consultants:  [facility] Cardiology    Procedures/Significant Events:  CTA CHEST 11/6:  Personally reviewed by me. Bilateral pleural effusions right greater than left. Appear to be free flowing pleural effusions. No pulmonary embolism. No pathologic mediastinal adenopathy. Minimal pericardial effusion. Adjacent compressive atelectasis to pleural effusions. TTE 11/7:  LV normal in size with moderate LVH. EF 55-60%. No regional wall motion abnormalities. Grade 2 diastolic dysfunction. LA mildly dilated & RA normal in size. RV normal in size and function. Poorly visualized aortic valve without obvious stenosis or regurgitation. Aortic root normal in size. Trivial mitral regurgitation without stenosis. No pulmonic stenosis or regurgitation. Mild tricuspid regurgitation. No pericardial effusion. 11/7 RIGHT PLEURAL FLUID/THORACENTESIS 11/7:  500cc bloody fluid. Glucose 109 & 86% lymph.  11/7 PCXR (post thoracentesis):  Personally reviewed by me. Moderate size right pleural pneumothorax with suggestion of pleural thickening. Trachea midline. Residual left pleural effusion unchanged. Heart and mediastinal contours otherwise normal. CT CHEST W/O 11/7 >>>  11/06 - Admit 11/07 - Thoracentesis w/ post-procedure pneumothorax likely due to trapped lung.  I have personally reviewed and interpreted  all radiology studies and my findings are as above.  VENTILATOR  SETTINGS: None  Cultures MRSA PCR 11/6:  Negative Blood Cultures x2 11/6 >>> Right Pleural Fluid Culture 11/7 >>>   Antimicrobials: Anti-infectives (From admission, onward)   None       Devices    LINES / TUBES:      Continuous Infusions: . sodium chloride    . sodium chloride 50 mL/hr at 06/27/17 1547     Objective: Vitals:   06/28/17 0700 06/28/17 0800 06/28/17 0859 06/28/17 0900  BP: (!) 115/56 113/64  (!) 90/58  Pulse: 66 66  72  Resp: 14 19  (!) 24  Temp:      TempSrc:      SpO2: 100% 100% 100% 100%  Weight:        Intake/Output Summary (Last 24 hours) at 06/28/2017 0939 Last data filed at 06/28/2017 0800 Gross per 24 hour  Intake 1150 ml  Output 1001 ml  Net 149 ml   Filed Weights   06/26/17 1545 06/28/17 0409  Weight: 130 lb 1.1 oz (59 kg) 130 lb 1.1 oz (59 kg)    Examination:  General: A/O 4, positive acute on chronic respiratory distress Neck:  Negative scars, masses, torticollis, lymphadenopathy, JVD Lungs: diffuse poor air movement all left lung fields. Absent breath sounds RLL/RML.  Cardiovascular: Regular rate and rhythm without murmur gallop or rub normal S1 and S2 Abdomen: negative abdominal pain, nondistended, positive soft, bowel sounds, no rebound, no ascites, no appreciable mass Extremities: No significant cyanosis, clubbing, or edema bilateral lower extremities Skin: Midline chest incision healing well consistent with recent CABG Psychiatric:  Negative depression, negative anxiety, negative fatigue, negative mania  Central nervous system:  Cranial nerves II through XII intact, tongue/uvula midline, all extremities muscle strength 5/5, sensation intact throughout, negative dysarthria, negative expressive aphasia, negative receptive aphasia.  .     Data Reviewed: Care during the described time interval was provided by me .  I have reviewed this patient's available data, including medical history, events of note, physical  examination, and all test results as part of my evaluation.   CBC: Recent Labs  Lab 06/26/17 1045 06/27/17 0331  WBC 3.1* 5.3  NEUTROABS 2.4  --   HGB 9.2* 9.1*  HCT 26.5* 25.8*  MCV 86.0 86.3  PLT 169 160   Basic Metabolic Panel: Recent Labs  Lab 06/26/17 1045 06/26/17 1427 06/26/17 1932 06/27/17 0331 06/27/17 1232 06/28/17 0301  NA 116*  --  118* 120* 121* 122*  K 2.4*  --  3.0* 3.8 5.0 3.5  CL <65*  --  <65* 72* 79* 81*  CO2 49*  --  41* 36* 34* 34*  GLUCOSE 125*  --  124* 84 114* 91  BUN 6  --  6 5* 6 <5*  CREATININE 0.79  --  0.99 0.81 0.72 0.70  CALCIUM 8.2*  --  7.8* 8.0* 8.0* 8.1*  MG  --   --   --  1.5*  --   --   PHOS  --  2.5  --  2.7  --   --    GFR: Estimated Creatinine Clearance: 60.5 mL/min (by C-G formula based on SCr of 0.7 mg/dL). Liver Function Tests: Recent Labs  Lab 06/26/17 1045 06/27/17 1540  AST 22  --   ALT 13*  --   ALKPHOS 117  --   BILITOT 1.5*  --   PROT 5.4* 5.1*  ALBUMIN 3.3*  --  No results for input(s): LIPASE, AMYLASE in the last 168 hours. No results for input(s): AMMONIA in the last 168 hours. Coagulation Profile: Recent Labs  Lab 06/26/17 1045  INR 1.06   Cardiac Enzymes: No results for input(s): CKTOTAL, CKMB, CKMBINDEX, TROPONINI in the last 168 hours. BNP (last 3 results) No results for input(s): PROBNP in the last 8760 hours. HbA1C: No results for input(s): HGBA1C in the last 72 hours. CBG: Recent Labs  Lab 06/26/17 2224 06/27/17 0812 06/27/17 1723 06/27/17 2133 06/28/17 0822  GLUCAP 112* 97 108* 109* 99   Lipid Profile: No results for input(s): CHOL, HDL, LDLCALC, TRIG, CHOLHDL, LDLDIRECT in the last 72 hours. Thyroid Function Tests: No results for input(s): TSH, T4TOTAL, FREET4, T3FREE, THYROIDAB in the last 72 hours. Anemia Panel: No results for input(s): VITAMINB12, FOLATE, FERRITIN, TIBC, IRON, RETICCTPCT in the last 72 hours. Urine analysis:    Component Value Date/Time   COLORURINE  YELLOW 06/26/2017 1541   APPEARANCEUR CLEAR 06/26/2017 1541   LABSPEC 1.034 (H) 06/26/2017 1541   PHURINE 9.0 (H) 06/26/2017 1541   GLUCOSEU NEGATIVE 06/26/2017 1541   HGBUR NEGATIVE 06/26/2017 1541   BILIRUBINUR NEGATIVE 06/26/2017 1541   KETONESUR NEGATIVE 06/26/2017 1541   PROTEINUR NEGATIVE 06/26/2017 1541   NITRITE NEGATIVE 06/26/2017 1541   LEUKOCYTESUR NEGATIVE 06/26/2017 1541   Sepsis Labs: @LABRCNTIP (procalcitonin:4,lacticidven:4)  ) Recent Results (from the past 240 hour(s))  Culture, blood (routine x 2)     Status: None (Preliminary result)   Collection Time: 06/26/17 11:15 AM  Result Value Ref Range Status   Specimen Description BLOOD RIGHT ANTECUBITAL  Final   Special Requests   Final    BOTTLES DRAWN AEROBIC AND ANAEROBIC Blood Culture results may not be optimal due to an excessive volume of blood received in culture bottles   Culture NO GROWTH 1 DAY  Final   Report Status PENDING  Incomplete  Culture, blood (routine x 2)     Status: None (Preliminary result)   Collection Time: 06/26/17 11:25 AM  Result Value Ref Range Status   Specimen Description BLOOD RIGHT HAND  Final   Special Requests   Final    BOTTLES DRAWN AEROBIC AND ANAEROBIC Blood Culture adequate volume   Culture NO GROWTH 1 DAY  Final   Report Status PENDING  Incomplete  MRSA PCR Screening     Status: None   Collection Time: 06/26/17  3:04 PM  Result Value Ref Range Status   MRSA by PCR NEGATIVE NEGATIVE Final    Comment:        The GeneXpert MRSA Assay (FDA approved for NASAL specimens only), is one component of a comprehensive MRSA colonization surveillance program. It is not intended to diagnose MRSA infection nor to guide or monitor treatment for MRSA infections.   Body fluid culture     Status: None (Preliminary result)   Collection Time: 06/27/17  3:36 PM  Result Value Ref Range Status   Specimen Description FLUID RIGHT PLEURAL  Final   Special Requests NONE  Final   Gram Stain    Final    MODERATE WBC PRESENT, PREDOMINANTLY MONONUCLEAR NO ORGANISMS SEEN    Culture PENDING  Incomplete   Report Status PENDING  Incomplete         Radiology Studies: Ct Chest Wo Contrast  Result Date: 06/27/2017 CLINICAL DATA:  Status post thoracentesis with pressure dependent pleuroparenchymal fistula. No pneumothorax. EXAM: CT CHEST WITHOUT CONTRAST TECHNIQUE: Multidetector CT imaging of the chest was performed following the standard  protocol without IV contrast. COMPARISON:  None. FINDINGS: Cardiovascular: Stable cardiomegaly with small pericardial effusion or thickening. Left main and three-vessel dense coronary arteriosclerosis is noted with aortic atherosclerosis and atherosclerosis of the proximal great vessels. Patient is status post CABG. Shallow left lateral aneurysmal bulging of the anterior aortic arch, measuring 0.5 cm in depth by 1.5 cm in length, unchanged in appearance. Mediastinum/Nodes: No esophageal abnormality. No mediastinal shift. Patent trachea and mainstem bronchi. Lungs/Pleura: The patient has undergone thoracentesis of moderate to large right pleural effusion. Residual 1.7 cm in thickness right and 1.4 cm in thickness left small bilateral pleural effusions are noted with incomplete re-expansion of right middle and lower lobes. Findings likely represent a pneumothorax ex vacuo at the right lung base from evacuation of chronic pleural effusion. The ex vacuo pneumothorax measures 2.4 cm in thickness along the anterior aspect of the right upper lobe cannot 6.8 cm in thickness on coronal reformats at the right lung base. Compressive atelectasis is seen at the left lung base. Upper Abdomen: No acute abnormality. Musculoskeletal: No acute nor suspicious osseous abnormalities. IMPRESSION: 1. Status post thoracentesis of moderate right pleural effusion from the right lung base. 2. There is resultant incomplete re-expansion of the right middle and lower lobes. Given this appearance,  findings likely represent a pneumothorax ex vacuo. No mediastinal shift is noted. 3. There are small bilateral pleural effusions as above described measuring up to 1.7 cm in thickness on the right and 1.4 cm in thickness on the left, both at the lung bases posteriorly. 4. Cardiomegaly with trace pericardial effusion, stable in appearance with post CABG change. 5. Moderate aortic atherosclerosis and atherosclerosis of the great vessels. Slight left lateral aneurysmal bulging of the aortic arch measuring 1.5 cm in length by 0.5 cm in depth also stable. Aortic Atherosclerosis (ICD10-I70.0). Electronically Signed   By: Tollie Eth M.D.   On: 06/27/2017 20:15   Ct Angio Chest Pe W And/or Wo Contrast  Result Date: 06/26/2017 CLINICAL DATA:  Shortness of breath for 1 week, recent CABG on 06/05/2017, lower extremity swelling, question pulmonary embolism with high pretest probability, history COPD, coronary disease, smoker EXAM: CT ANGIOGRAPHY CHEST WITH CONTRAST TECHNIQUE: Multidetector CT imaging of the chest was performed using the standard protocol during bolus administration of intravenous contrast. Multiplanar CT image reconstructions and MIPs were obtained to evaluate the vascular anatomy. CONTRAST:  100 cc Isovue 370 IV COMPARISON:  09/05/2015 FINDINGS: Cardiovascular: Atherosclerotic calcifications aorta, coronary arteries and proximal great vessels. Aorta normal caliber without aneurysm or dissection. Minimal pericardial effusion. Pulmonary arteries well opacified and patent. No evidence pulmonary embolism. Mediastinum/Nodes: No esophageal abnormalities. Prior median sternotomy. Minimal stranding in the anterior mediastinum consistent recent surgery. Base of cervical region unremarkable. No thoracic adenopathy. Lungs/Pleura: BILATERAL pleural effusions small on LEFT and moderate on RIGHT. Compressive atelectasis of the lower lobes. Subsegmental atelectasis RIGHT middle lobe. Additional mild atelectasis along  RIGHT major fissure. Center peribronchial thickening. No pulmonary infiltrate or pneumothorax. Upper Abdomen: Visualized upper abdomen unremarkable Musculoskeletal: No acute osseous findings. Review of the MIP images confirms the above findings. IMPRESSION: No evidence of pulmonary embolism. BILATERAL pleural effusions and compressive atelectasis of the lower lobes RIGHT greater than LEFT. Additional bronchitic changes with subsegmental atelectasis at RIGHT middle lobe. Minimal pericardial effusion and postsurgical changes of the mediastinum. Aortic Atherosclerosis (ICD10-I70.0). Electronically Signed   By: Ulyses Southward M.D.   On: 06/26/2017 14:03   Dg Chest Port 1 View  Result Date: 06/28/2017 CLINICAL DATA:  66 year old female  with pneumothorax. EXAM: PORTABLE CHEST 1 VIEW COMPARISON:  X-ray and CT dated 06/27/2017. FINDINGS: Median sternotomy wires again noted. The cardiomediastinal silhouette is unchanged in size and morphology. Moderate to large right apical pneumothorax is again noted. There is an enlarging right basilar pleural effusion small left basilar effusion and associated atelectasis also identified. Superimposed consolidation difficult to exclude in this setting. No left-sided pneumothorax. No acute osseous abnormalities. IMPRESSION: 1. Continued moderate to large right apical pneumothorax with developing right basilar effusion and atelectasis, with or without superimposed consolidation. 2. Small left basilar effusion and associated atelectasis. Again, superimposed consolidation difficult to exclude. Electronically Signed   By: Sande BrothersSerena  Chacko M.D.   On: 06/28/2017 07:18   Dg Chest Portable 1 View  Result Date: 06/27/2017 CLINICAL DATA:  Status post thoracentesis. EXAM: PORTABLE CHEST 1 VIEW COMPARISON:  06/26/2017 CT and prior studies FINDINGS: Cardiomegaly and CABG changes noted. A moderate to large right pneumothorax is noted. Given CT findings, this may be secondary to unexpandable mid-lower  lung. Apical pneumothorax could be be related to introduce air as no upper lung pleural thickening or abnormality is identified on CT. Right lower lung consolidation/atelectasis noted. Left basilar atelectasis again identified. IMPRESSION: Moderate to large right pneumothorax which may be in part due to unexpandable lung. Cardiomegaly, right lower lung consolidation/ atelectasis and left basilar atelectasis again noted. Critical Value/emergent results were called by telephone at the time of interpretation on 06/27/2017 at 4:01 pm to [facility] LPJennifer, nurse for this [facility] Ppatient[facility]ATHRYN [facility]ITEHEART , who verbally acknowledged these results. Electronically Signed   By: Harmon PierJeffrey  Hu M.D.   On: 06/27/2017 16:01   Dg Chest Port 1 View  Result Date: 06/26/2017 CLINICAL DATA:  Shortness of Breath EXAM: PORTABLE CHEST 1 VIEW COMPARISON:  June 21, 2017 FINDINGS: There is airspace consolidation in the right lower lobe with right pleural effusion, stable. There is a minimal left pleural effusion with mild left base atelectasis. Lungs elsewhere clear. The heart size and pulmonary vascularity are normal. The patient is status post coronary artery bypass grafting. No adenopathy. There is aortic atherosclerosis. No bone lesions. IMPRESSION: Persistent airspace consolidation right base with right pleural effusion. Small left pleural effusion with left base atelectasis. Appearance stable compared to recent study. Stable cardiac silhouette. There is aortic atherosclerosis. Aortic Atherosclerosis (ICD10-I70.0). Electronically Signed   By: Bretta BangWilliam  Woodruff III M.D.   On: 06/26/2017 11:22        Scheduled Meds: . amiodarone  400 mg Oral Daily  . atorvastatin  80 mg Oral q1800  . ferrous gluconate  324 mg Oral BID WC  . fluticasone furoate-vilanterol  1 puff Inhalation Daily  . heparin subcutaneous  5,000 Units Subcutaneous Q8H  . Influenza vac split quadrivalent PF  0.5 mL Intramuscular Tomorrow-1000  . insulin aspart  0-9 Units  Subcutaneous TID WC  . tiotropium  18 mcg Inhalation Daily   Continuous Infusions: . sodium chloride    . sodium chloride 50 mL/hr at 06/27/17 1547     LOS: 2 days    Time spent: 40 minutes    WOODS, Roselind MessierURTIS J, MD [facility] Pager [phone]   If 7PM-7AM, please contact night-coverage [email or web address] Password [email or web address] 06/28/2017, 9:39 AM

## 2017-06-29 ENCOUNTER — Inpatient Hospital Stay (HOSPITAL_COMMUNITY): Payer: Medicare Other

## 2017-06-29 ENCOUNTER — Ambulatory Visit: Payer: Medicare Other | Admitting: Cardiology

## 2017-06-29 DIAGNOSIS — J939 Pneumothorax, unspecified: Secondary | ICD-10-CM | POA: Diagnosis not present

## 2017-06-29 LAB — CBC
HEMATOCRIT: 24.1 % — AB (ref 36.0–46.0)
HEMOGLOBIN: 8.3 g/dL — AB (ref 12.0–15.0)
MCH: 30.9 pg (ref 26.0–34.0)
MCHC: 34.4 g/dL (ref 30.0–36.0)
MCV: 89.6 fL (ref 78.0–100.0)
PLATELETS: 147 10*3/uL — AB (ref 150–400)
RBC: 2.69 MIL/uL — AB (ref 3.87–5.11)
RDW: 15.1 % (ref 11.5–15.5)
WBC: 5.4 10*3/uL (ref 4.0–10.5)

## 2017-06-29 LAB — GLUCOSE, CAPILLARY
GLUCOSE-CAPILLARY: 116 mg/dL — AB (ref 65–99)
Glucose-Capillary: 115 mg/dL — ABNORMAL HIGH (ref 65–99)

## 2017-06-29 LAB — BASIC METABOLIC PANEL
ANION GAP: 8 (ref 5–15)
CO2: 33 mmol/L — AB (ref 22–32)
Calcium: 8.2 mg/dL — ABNORMAL LOW (ref 8.9–10.3)
Chloride: 85 mmol/L — ABNORMAL LOW (ref 101–111)
Creatinine, Ser: 0.58 mg/dL (ref 0.44–1.00)
GFR calc Af Amer: >60 mL/min (ref >60)
GLUCOSE: 111 mg/dL — AB (ref 65–99)
POTASSIUM: 3.8 mmol/L (ref 3.5–5.1)
Sodium: 126 mmol/L — ABNORMAL LOW (ref 135–145)

## 2017-06-29 LAB — PHOSPHORUS: Phosphorus: 4.3 mg/dL (ref 2.5–4.6)

## 2017-06-29 LAB — MAGNESIUM: Magnesium: 1.9 mg/dL (ref 1.7–2.4)

## 2017-06-29 MED ORDER — IPRATROPIUM-ALBUTEROL 20-100 MCG/ACT IN AERS: 1.0000 | INHALATION_SPRAY | Freq: Four times a day (QID) | RESPIRATORY_TRACT | Status: DC | PRN

## 2017-06-29 MED ORDER — ASPIRIN 81 MG PO CHEW
81.0000 mg | CHEWABLE_TABLET | Freq: Every day | ORAL | Status: DC
  Administered 2017-06-30 – 2017-07-05 (×6): 81 mg via ORAL
  Filled 2017-06-29 (×6): qty 1

## 2017-06-29 MED ORDER — IPRATROPIUM-ALBUTEROL 0.5-2.5 (3) MG/3ML IN SOLN
3.0000 mL | Freq: Four times a day (QID) | RESPIRATORY_TRACT | Status: DC | PRN
  Administered 2017-06-29: 3 mL via RESPIRATORY_TRACT
  Filled 2017-06-29: qty 3

## 2017-06-29 MED ORDER — NICOTINE 14 MG/24HR TD PT24
14.0000 mg | MEDICATED_PATCH | Freq: Every day | TRANSDERMAL | Status: DC
  Administered 2017-06-29 – 2017-07-05 (×7): 14 mg via TRANSDERMAL
  Filled 2017-06-29 (×7): qty 1

## 2017-06-29 MED ORDER — METOPROLOL TARTRATE 12.5 MG HALF TABLET
12.5000 mg | ORAL_TABLET | Freq: Two times a day (BID) | ORAL | Status: DC
  Administered 2017-06-29 – 2017-07-05 (×13): 12.5 mg via ORAL
  Filled 2017-06-29 (×14): qty 1

## 2017-06-29 MED ORDER — ASPIRIN EC 81 MG PO TBEC
81.0000 mg | DELAYED_RELEASE_TABLET | Freq: Every day | ORAL | Status: DC
  Administered 2017-06-29: 81 mg via ORAL
  Filled 2017-06-29: qty 1

## 2017-06-29 MED ORDER — DIPHENHYDRAMINE HCL 25 MG PO CAPS: 25.0000 mg | ORAL_CAPSULE | Freq: Once | ORAL | Status: DC

## 2017-06-29 NOTE — Progress Notes (Signed)
[facility] TEAM 1 - Stepdown/ICU TEAM  Samantha Dyer  ZOX:[id number] DOB: [birth year] DOA: 06/26/2017 PCP: Marinda Elk, MD    Brief Narrative:  66 y.o. female with a history of severe COPD just recently on 2-2.5L home O2, and CAD status post CABG on 06/06/17 who presented to the ED w/ weakness and lethargy. Workup revealed hyponatremia, hypokalemia, and metabolic alkalosis. Notably the patient had been undergoing aggressive diuresis postoperatively.  Significant Events: 11/06 - Admit 11/07 - Thoracentesis w/ post-procedure pneumothorax likely due to trapped lung 11/08 - [facility] assumed care   Subjective: Sitting up at bedside.  Has no complaints.  Denies sob, cp, n/v, or abdom pain.    Assessment & Plan:  Bilateral pleural effusions secondary to previous thoracic surgery - clinically insignificant at this time   R PTX Due to trapped lung - PCCM following - stable at this time w/o chest tube   Hyponatremia Due to diuretic - improving with gentle volume expansion - stop IVF today and follow as pt developing LE edema   Hypokalemia Due to diuretic - corrected   Hypomagnesemia Due to diuretic - corrected   Hypophosphatemia Corrected   Acute on chronic Hypoxic respiratory failure - COPD Stable at present - wean O2 as able   Chronic diastolic CHF Developing B LE edema - stop IVF and follow   CAD status post CABG October 2018 No chest pain presently   Paroxysmal atrial fibrillation NSR at this time   Iron deficiency anemia Follow Hgb - no evidence of blood loss at this time  DVT prophylaxis: SQ heparin  Code Status: FULL CODE Family Communication: no family present at time of exam  Disposition Plan: stable for SDU   Consultants:  PCCM  Antimicrobials:  None  Objective: Blood pressure 112/63, pulse 75, temperature 98.1 F (36.7 C), temperature source Oral, resp. rate 18, weight 59 kg (130 lb 1.1 oz), SpO2 100 %.  Intake/Output Summary (Last 24 hours) at  06/29/2017 0938 Last data filed at 06/29/2017 0800 Gross per 24 hour  Intake 1350 ml  Output 1100 ml  Net 250 ml   Filed Weights   06/26/17 1545 06/28/17 0409 06/28/17 1054  Weight: 59 kg (130 lb 1.1 oz) 59 kg (130 lb 1.1 oz) 59 kg (130 lb 1.1 oz)    Examination: General: No acute respiratory distress evident  Lungs: poor air movement B bases - no wheezing  Cardiovascular: Regular rate and rhythm  Abdomen: Nontender, nondistended, soft, bowel sounds positive, no rebound, no ascites, no appreciable mass Extremities: 1+ B LE R slightly worse than L   CBC: Recent Labs  Lab 06/26/17 1045 06/27/17 0331 06/29/17 0239  WBC 3.1* 5.3 5.4  NEUTROABS 2.4  --   --   HGB 9.2* 9.1* 8.3*  HCT 26.5* 25.8* 24.1*  MCV 86.0 86.3 89.6  PLT 169 160 147*   Basic Metabolic Panel: Recent Labs  Lab 06/26/17 1427 06/26/17 1932 06/27/17 0331 06/27/17 1232 06/28/17 0301 06/28/17 1545 06/29/17 0239  NA  --  118* 120* 121* 122*  --  126*  K  --  3.0* 3.8 5.0 3.5 4.1 3.8  CL  --  <65* 72* 79* 81*  --  85*  CO2  --  41* 36* 34* 34*  --  33*  GLUCOSE  --  124* 84 114* 91  --  111*  BUN  --  6 5* 6 <5*  --  <5*  CREATININE  --  0.99 0.81 0.72 0.70  --  0.58  CALCIUM  --  7.8* 8.0* 8.0* 8.1*  --  8.2*  MG  --   --  1.5*  --   --  1.4* 1.9  PHOS 2.5  --  2.7  --   --  2.3* 4.3   GFR: Estimated Creatinine Clearance: 60.5 mL/min (by C-G formula based on SCr of 0.58 mg/dL).  Liver Function Tests: Recent Labs  Lab 06/26/17 1045 06/27/17 1540  AST 22  --   ALT 13*  --   ALKPHOS 117  --   BILITOT 1.5*  --   PROT 5.4* 5.1*  ALBUMIN 3.3*  --    Coagulation Profile: Recent Labs  Lab 06/26/17 1045  INR 1.06    CBG: Recent Labs  Lab 06/28/17 0822 06/28/17 1108 06/28/17 1602 06/28/17 2100 06/29/17 0832  GLUCAP 99 132* 98 122* 115*    Recent Results (from the past 240 hour(s))  Culture, blood (routine x 2)     Status: None (Preliminary result)   Collection Time: 06/26/17 11:15 AM   Result Value Ref Range Status   Specimen Description BLOOD RIGHT ANTECUBITAL  Final   Special Requests   Final    BOTTLES DRAWN AEROBIC AND ANAEROBIC Blood Culture results may not be optimal due to an excessive volume of blood received in culture bottles   Culture NO GROWTH 2 DAYS  Final   Report Status PENDING  Incomplete  Culture, blood (routine x 2)     Status: None (Preliminary result)   Collection Time: 06/26/17 11:25 AM  Result Value Ref Range Status   Specimen Description BLOOD RIGHT HAND  Final   Special Requests   Final    BOTTLES DRAWN AEROBIC AND ANAEROBIC Blood Culture adequate volume   Culture NO GROWTH 2 DAYS  Final   Report Status PENDING  Incomplete  MRSA PCR Screening     Status: None   Collection Time: 06/26/17  3:04 PM  Result Value Ref Range Status   MRSA by PCR NEGATIVE NEGATIVE Final    Comment:        The GeneXpert MRSA Assay (FDA approved for NASAL specimens only), is one component of a comprehensive MRSA colonization surveillance program. It is not intended to diagnose MRSA infection nor to guide or monitor treatment for MRSA infections.   Body fluid culture     Status: None (Preliminary result)   Collection Time: 06/27/17  3:36 PM  Result Value Ref Range Status   Specimen Description FLUID RIGHT PLEURAL  Final   Special Requests NONE  Final   Gram Stain   Final    MODERATE WBC PRESENT, PREDOMINANTLY MONONUCLEAR NO ORGANISMS SEEN    Culture NO GROWTH 2 DAYS  Final   Report Status PENDING  Incomplete     Scheduled Meds: . amiodarone  400 mg Oral Daily  . atorvastatin  80 mg Oral q1800  . ferrous gluconate  324 mg Oral BID WC  . fluticasone furoate-vilanterol  1 puff Inhalation Daily  . heparin subcutaneous  5,000 Units Subcutaneous Q8H  . insulin aspart  0-9 Units Subcutaneous TID WC  . montelukast  10 mg Oral QHS  . tiotropium  18 mcg Inhalation Daily     LOS: 3 days   Lonia BloodJeffrey T. Telina Kleckley, MD [facility] Office   [phone] Pager - Text Page per Amion as per below:  On-Call/Text Page:      [email or web address]      password [email or web address]  If 7PM-7AM, please contact night-coverage [email or web address] Password [email or web address]  06/29/2017, 9:38 AM

## 2017-06-29 NOTE — Care Management Note (Signed)
Case Management Note  Patient Details  Name: Samantha Dyer MRN: [id number] Date of Birth: 28-Mar-1951  Subjective/Objective:       Pt admitted with weakness and lethargy           Action/Plan:  PTA independent from home with husband.  Pt is on supplemental oxygen - pt states she is not active with any DME company "I purchased my own concentrator".  Pt states that she does not have portable oxygen tank for discharge home - [facility] and agency will be able to provide portable tank for transport home - CM will need to follow up with agency at discharge   Expected Discharge Date:                  Expected Discharge Plan:  Home/Self Care  In-House Referral:     Discharge planning Services  CM Consult  Post Acute Care Choice:    Choice offered to:     DME Arranged:    DME Agency:     HH Arranged:    HH Agency:     Status of Service:     If discussed at [employer] Meetings, dates discussed:    Additional Comments:  Cherylann ParrClaxton, Jancarlos Thrun S, RN 06/29/2017, 3:46 PM

## 2017-06-29 NOTE — Progress Notes (Signed)
Name: Samantha Dyer MRN: [id number] DOB: [birth year]    ADMISSION DATE:  06/26/2017 CONSULTATION DATE: 11/6  REFERRING MD : Clarice PolePfeifer  CHIEF COMPLAINT: Hyponatremia  BRIEF PATIENT DESCRIPTION:  This is a 66 year old white female with a known history of chronic obstructive pulmonary disease and recent coronary artery bypass grafting surgery on 10/17.  Presented to the emergency room on 10/6 with chief complaint of weakness and lethargy.  Diagnostic evaluation in the emergency room yielded a serum sodium of 116, and a potassium of 2.4 with a bicarbonate of 49.  Apparently her postop course was remarkable for aggressive diuresis in the setting of depressed ejection fraction of 40-45%.  Her serum bicarbonate was noted to be 49 on presentation she was admitted to the intensive care due to her severe metabolic disarray.  SIGNIFICANT EVENTS    STUDIES:  BNP: 484 Cortisol 23.2 Serum osmole 242  11/7 right thoracentesis 500 mL; serum protein 5.1 serum LDH 175 Pleural fluid culture>>> Cytology>>. Pleural glucose 109, pleural cell count red, turbid, white blood cells unable to perform.  CT chest obtained 11/7: Moderate right pleural effusion at right lung base.  Incomplete reexpansion of the right middle and lower lobes.  No mediastinal shift.  Small bilateral pleural effusions.  Cardiomegaly.  SUBJECTIVE:  No new shortness of breath, denies chest discomfort, denies new complaints  VITAL SIGNS: Temp:  [97.4 F (36.3 C)-98.3 F (36.8 C)] 98.1 F (36.7 C) (11/09 0830) Pulse Rate:  [63-76] 75 (11/09 0800) Resp:  [12-24] 18 (11/09 0800) BP: (90-127)/(58-84) 112/63 (11/09 0800) SpO2:  [95 %-100 %] 95 % (11/09 0800) Weight:  [130 lb 1.1 oz (59 kg)] 130 lb 1.1 oz (59 kg) (11/08 1054)  Intake/Output Summary (Last 24 hours) at 06/29/2017 0833 Last data filed at 06/29/2017 0800 Gross per 24 hour  Intake 1400 ml  Output 1100 ml  Net 300 ml   2 L nasal cannula PHYSICAL  EXAMINATION: General: This is a pleasant 66 year old white female resting comfortably sitting up at the side of the bed.  She is in no acute distress. HEENT: Normocephalic atraumatic no jugular venous distention Pulmonary: Clear to auscultation diminished bases little more diminished on the right than left no accessory muscle use Cardiac regular rate and rhythm without murmur rub or gallop Extremities/musculoskeletal: Warm dry no edema brisk cap refill strong pulses Abdomen: Soft nontender no organomegaly positive bowel sounds GU: Voiding Neuro/psych: Awake alert no focal deficits affect the same, remains somewhat flat.  Good strength, equal strength.  Recent Labs  Lab 06/27/17 1232 06/28/17 0301 06/28/17 1545 06/29/17 0239  NA 121* 122*  --  126*  K 5.0 3.5 4.1 3.8  CL 79* 81*  --  85*  CO2 34* 34*  --  33*  BUN 6 <5*  --  <5*  CREATININE 0.72 0.70  --  0.58  GLUCOSE 114* 91  --  111*   Recent Labs  Lab 06/26/17 1045 06/27/17 0331 06/29/17 0239  HGB 9.2* 9.1* 8.3*  HCT 26.5* 25.8* 24.1*  WBC 3.1* 5.3 5.4  PLT 169 160 147*   Ct Chest Wo Contrast  Result Date: 06/27/2017 CLINICAL DATA:  Status post thoracentesis with pressure dependent pleuroparenchymal fistula. No pneumothorax. EXAM: CT CHEST WITHOUT CONTRAST TECHNIQUE: Multidetector CT imaging of the chest was performed following the standard protocol without IV contrast. COMPARISON:  None. FINDINGS: Cardiovascular: Stable cardiomegaly with small pericardial effusion or thickening. Left main and three-vessel dense coronary arteriosclerosis is noted with aortic atherosclerosis and atherosclerosis  of the proximal great vessels. Patient is status post CABG. Shallow left lateral aneurysmal bulging of the anterior aortic arch, measuring 0.5 cm in depth by 1.5 cm in length, unchanged in appearance. Mediastinum/Nodes: No esophageal abnormality. No mediastinal shift. Patent trachea and mainstem bronchi. Lungs/Pleura: The patient has  undergone thoracentesis of moderate to large right pleural effusion. Residual 1.7 cm in thickness right and 1.4 cm in thickness left small bilateral pleural effusions are noted with incomplete re-expansion of right middle and lower lobes. Findings likely represent a pneumothorax ex vacuo at the right lung base from evacuation of chronic pleural effusion. The ex vacuo pneumothorax measures 2.4 cm in thickness along the anterior aspect of the right upper lobe cannot 6.8 cm in thickness on coronal reformats at the right lung base. Compressive atelectasis is seen at the left lung base. Upper Abdomen: No acute abnormality. Musculoskeletal: No acute nor suspicious osseous abnormalities. IMPRESSION: 1. Status post thoracentesis of moderate right pleural effusion from the right lung base. 2. There is resultant incomplete re-expansion of the right middle and lower lobes. Given this appearance, findings likely represent a pneumothorax ex vacuo. No mediastinal shift is noted. 3. There are small bilateral pleural effusions as above described measuring up to 1.7 cm in thickness on the right and 1.4 cm in thickness on the left, both at the lung bases posteriorly. 4. Cardiomegaly with trace pericardial effusion, stable in appearance with post CABG change. 5. Moderate aortic atherosclerosis and atherosclerosis of the great vessels. Slight left lateral aneurysmal bulging of the aortic arch measuring 1.5 cm in length by 0.5 cm in depth also stable. Aortic Atherosclerosis (ICD10-I70.0). Electronically Signed   By: Tollie Eth M.D.   On: 06/27/2017 20:15   Dg Chest Port 1 View  Result Date: 06/29/2017 CLINICAL DATA:  Pneumothorax on the right EXAM: PORTABLE CHEST 1 VIEW COMPARISON:  Yesterday FINDINGS: Right hydropneumothorax. There is no convincing change in the gas component when counting ribs spaces and when accounting for displacement of gas at the base. There is no mediastinal shift or diaphragm depression. Left lung is clear.  Cardiomegaly. Status post CABG. IMPRESSION: Approximately 50% right pneumothorax without convincing change from yesterday. Stable layering right pleural effusion. Electronically Signed   By: Marnee Spring M.D.   On: 06/29/2017 06:46   Dg Chest Port 1 View  Result Date: 06/28/2017 CLINICAL DATA:  Pneumothorax, followup EXAM: PORTABLE CHEST 1 VIEW COMPARISON:  Portable chest x-ray of 06/28/2017 earlier in the day FINDINGS: There is little change in volume of the right pneumothorax which is at least 50%. Right pleural effusion again is noted as well. Some opacity is present medially at the left lung base which could be due to atelectasis but pneumonia cannot be excluded. Mediastinal and hilar contours are unremarkable and mild cardiomegaly is stable. Median sternotomy sutures are noted IMPRESSION: 1. Little change in approximately 50% right pneumothorax with right pleural effusion. 2. No change in opacity medially at the left lung base. Atelectasis or pneumonia. Electronically Signed   By: Dwyane Dee M.D.   On: 06/28/2017 17:19   Dg Chest Port 1 View  Result Date: 06/28/2017 CLINICAL DATA:  66 year old female with pneumothorax. EXAM: PORTABLE CHEST 1 VIEW COMPARISON:  X-ray and CT dated 06/27/2017. FINDINGS: Median sternotomy wires again noted. The cardiomediastinal silhouette is unchanged in size and morphology. Moderate to large right apical pneumothorax is again noted. There is an enlarging right basilar pleural effusion small left basilar effusion and associated atelectasis also identified. Superimposed consolidation difficult  to exclude in this setting. No left-sided pneumothorax. No acute osseous abnormalities. IMPRESSION: 1. Continued moderate to large right apical pneumothorax with developing right basilar effusion and atelectasis, with or without superimposed consolidation. 2. Small left basilar effusion and associated atelectasis. Again, superimposed consolidation difficult to exclude.  Electronically Signed   By: Sande BrothersSerena  Chacko M.D.   On: 06/28/2017 07:18   Dg Chest Portable 1 View  Result Date: 06/27/2017 CLINICAL DATA:  Status post thoracentesis. EXAM: PORTABLE CHEST 1 VIEW COMPARISON:  06/26/2017 CT and prior studies FINDINGS: Cardiomegaly and CABG changes noted. A moderate to large right pneumothorax is noted. Given CT findings, this may be secondary to unexpandable mid-lower lung. Apical pneumothorax could be be related to introduce air as no upper lung pleural thickening or abnormality is identified on CT. Right lower lung consolidation/atelectasis noted. Left basilar atelectasis again identified. IMPRESSION: Moderate to large right pneumothorax which may be in part due to unexpandable lung. Cardiomegaly, right lower lung consolidation/ atelectasis and left basilar atelectasis again noted. Critical Value/emergent results were called by telephone at the time of interpretation on 06/27/2017 at 4:01 pm to [facility] Digest[facility] CareJennifer, nurse for [facility] , who verbally acknowledged these results. Electronically Signed   By: Harmon PierJeffrey  Hu M.D.   On: 06/27/2017 16:01   Portable chest x-ray reviewed: No significant change in right-sided pneumothorax, now with small amount of right-sided pleural fluid  ASSESSMENT / PLAN:  Right exudative pleural effusion suspect related to postthoracotomy Right Ex vacuo Pneumothorax Hypoosmolar hyponatremia History of paroxysmal atrial fibrillation History of chronic obstructive pulmonary disease.  History of coronary artery disease, recent CABG, with chronic systolic heart  Anemia  Discussion: Sodium slowly improving with gentle saline replacement clinically she feels better.  We performed diagnostic/therapeutic right thoracentesis on 11/7 per the request of thoracic surgery.  The fluid analysis was consistent with exudative fluid collection.  Most likely reflects post thoracotomy changes.  Her right lung did not fully reexpand following  thoracentesis.  This appears primarily to be related to pneumothorax ex vacuo, her chest x-ray remains essentially unchanged.  Plan Normal saline Continue supplemental oxygen Repeat a.m. chest x-ray We will hold off the tube thoracostomy, given chest x-ray remains stable concern that lung still may not reexpand  Simonne MartinetPeter E Babcock ACNP-BC [facility] Pulmonary/Critical Care Pager # [phone] OR # [phone] if no answer  06/29/2017, 8:33 AM

## 2017-06-30 ENCOUNTER — Encounter (HOSPITAL_COMMUNITY): Payer: Self-pay | Admitting: Emergency Medicine

## 2017-06-30 ENCOUNTER — Inpatient Hospital Stay (HOSPITAL_COMMUNITY): Payer: Medicare Other

## 2017-06-30 LAB — CBC
HEMATOCRIT: 24 % — AB (ref 36.0–46.0)
HEMOGLOBIN: 7.9 g/dL — AB (ref 12.0–15.0)
MCH: 29.7 pg (ref 26.0–34.0)
MCHC: 32.9 g/dL (ref 30.0–36.0)
MCV: 90.2 fL (ref 78.0–100.0)
Platelets: 134 10*3/uL — ABNORMAL LOW (ref 150–400)
RBC: 2.66 MIL/uL — ABNORMAL LOW (ref 3.87–5.11)
RDW: 14.9 % (ref 11.5–15.5)
WBC: 4.6 10*3/uL (ref 4.0–10.5)

## 2017-06-30 LAB — GLUCOSE, CAPILLARY
GLUCOSE-CAPILLARY: 122 mg/dL — AB (ref 65–99)
GLUCOSE-CAPILLARY: 139 mg/dL — AB (ref 65–99)
Glucose-Capillary: 147 mg/dL — ABNORMAL HIGH (ref 65–99)

## 2017-06-30 LAB — BASIC METABOLIC PANEL
ANION GAP: 6 (ref 5–15)
BUN: <5 mg/dL — ABNORMAL LOW (ref 6–20)
CO2: 33 mmol/L — AB (ref 22–32)
Calcium: 8.2 mg/dL — ABNORMAL LOW (ref 8.9–10.3)
Chloride: 87 mmol/L — ABNORMAL LOW (ref 101–111)
Creatinine, Ser: 0.62 mg/dL (ref 0.44–1.00)
GFR calc Af Amer: >60 mL/min (ref >60)
GFR calc non Af Amer: >60 mL/min (ref >60)
GLUCOSE: 101 mg/dL — AB (ref 65–99)
POTASSIUM: 4.1 mmol/L (ref 3.5–5.1)
Sodium: 126 mmol/L — ABNORMAL LOW (ref 135–145)

## 2017-06-30 LAB — MAGNESIUM: Magnesium: 1.8 mg/dL (ref 1.7–2.4)

## 2017-06-30 MED ORDER — DIPHENHYDRAMINE HCL 25 MG PO CAPS: 25.0000 mg | ORAL_CAPSULE | Freq: Every evening | ORAL | Status: DC | PRN

## 2017-06-30 NOTE — Progress Notes (Signed)
Name: Samantha Dyer MRN: [id number] DOB: 1950-10-02    ADMISSION DATE:  06/26/2017 CONSULTATION DATE: 11/6  REFERRING MD : Clarice PolePfeifer  CHIEF COMPLAINT: Hyponatremia  BRIEF PATIENT DESCRIPTION:  This is a 66 year old white female with a known history of chronic obstructive pulmonary disease and recent coronary artery bypass grafting surgery on 10/17.  Presented to the emergency room on 10/6 with chief complaint of weakness and lethargy.  Diagnostic evaluation in the emergency room yielded a serum sodium of 116, and a potassium of 2.4 with a bicarbonate of 49.  Apparently her postop course was remarkable for aggressive diuresis in the setting of depressed ejection fraction of 40-45%.  Her serum bicarbonate was noted to be 49 on presentation she was admitted to the intensive care due to her severe metabolic disarray.  SIGNIFICANT EVENTS  06/29/2017 transferred to stepdown unit  STUDIES:  BNP: 484 Cortisol 23.2 Serum osmole 242  11/7 right thoracentesis 500 mL; serum protein 5.1 serum LDH 175 Pleural fluid culture>>> Cytology>>. Pleural glucose 109, pleural cell count red, turbid, white blood cells unable to perform.  CT chest obtained 11/7: Moderate right pleural effusion at right lung base.  Incomplete reexpansion of the right middle and lower lobes.  No mediastinal shift.  Small bilateral pleural effusions.  Cardiomegaly.  06/30/2017  chest x-ray shows slight reduction in the right pneumothorax  SUBJECTIVE:  No acute distress sitting in chair comfortable.  No complaints of dyspnea.    VITAL SIGNS: Temp:  [97.5 F (36.4 C)-98.1 F (36.7 C)] 97.5 F (36.4 C) (11/10 0755) Pulse Rate:  [61-84] 70 (11/10 0755) Resp:  [17-20] 19 (11/10 0755) BP: (92-148)/(52-73) 136/57 (11/10 0755) SpO2:  [96 %-100 %] 100 % (11/10 0759) Weight:  [127 lb 3.3 oz (57.7 kg)] 127 lb 3.3 oz (57.7 kg) (11/10 0546)  Intake/Output Summary (Last 24 hours) at 06/30/2017 0833 Last data filed at 06/29/2017  2000 Gross per 24 hour  Intake 2392 ml  Output 220 ml  Net 2172 ml   2 L nasal cannula PHYSICAL EXAMINATION: General.  Well-nourished well-developed white female sitting in chair in no acute distress no complaints of dyspnea HEENT.  No JVD appreciated no lymphadenopathy pupils react equal reactive light Pulmonary decreased breath sounds on the right side. Cardiac.  Heart sounds are regular regular rate and rhythm GI.  Abdomen soft nontender positive bowel sounds GU.  Voids Extremities.  1+ edema  Recent Labs  Lab 06/28/17 0301 06/28/17 1545 06/29/17 0239 06/30/17 0248  NA 122*  --  126* 126*  K 3.5 4.1 3.8 4.1  CL 81*  --  85* 87*  CO2 34*  --  33* 33*  BUN <5*  --  <5* <5*  CREATININE 0.70  --  0.58 0.62  GLUCOSE 91  --  111* 101*   Recent Labs  Lab 06/27/17 0331 06/29/17 0239 06/30/17 0248  HGB 9.1* 8.3* 7.9*  HCT 25.8* 24.1* 24.0*  WBC 5.3 5.4 4.6  PLT 160 147* 134*   Dg Chest Port 1 View  Result Date: 06/30/2017 CLINICAL DATA:  Followup pneumothorax. EXAM: PORTABLE CHEST 1 VIEW COMPARISON:  06/29/2017 FINDINGS: Mild interval decrease in size in the right pneumothorax, which remains moderate in overall size. Persistent lung base opacity reflecting bilateral effusions with atelectasis. No left pneumothorax. Stable changes from prior CABG surgery. IMPRESSION: 1. Moderate-sized right pneumothorax has mildly decreased in size from the previous day's exam. 2. No other change. Persistent lung base opacity consistent with bilateral pleural effusions and  lung base atelectasis. Electronically Signed   By: Amie Portlandavid  Ormond M.D.   On: 06/30/2017 07:42   Dg Chest Port 1 View  Result Date: 06/29/2017 CLINICAL DATA:  Pneumothorax on the right EXAM: PORTABLE CHEST 1 VIEW COMPARISON:  Yesterday FINDINGS: Right hydropneumothorax. There is no convincing change in the gas component when counting ribs spaces and when accounting for displacement of gas at the base. There is no mediastinal  shift or diaphragm depression. Left lung is clear. Cardiomegaly. Status post CABG. IMPRESSION: Approximately 50% right pneumothorax without convincing change from yesterday. Stable layering right pleural effusion. Electronically Signed   By: Marnee SpringJonathon  Watts M.D.   On: 06/29/2017 06:46   Dg Chest Port 1 View  Result Date: 06/28/2017 CLINICAL DATA:  Pneumothorax, followup EXAM: PORTABLE CHEST 1 VIEW COMPARISON:  Portable chest x-ray of 06/28/2017 earlier in the day FINDINGS: There is little change in volume of the right pneumothorax which is at least 50%. Right pleural effusion again is noted as well. Some opacity is present medially at the left lung base which could be due to atelectasis but pneumonia cannot be excluded. Mediastinal and hilar contours are unremarkable and mild cardiomegaly is stable. Median sternotomy sutures are noted IMPRESSION: 1. Little change in approximately 50% right pneumothorax with right pleural effusion. 2. No change in opacity medially at the left lung base. Atelectasis or pneumonia. Electronically Signed   By: Dwyane DeePaul  Barry M.D.   On: 06/28/2017 17:19   Recent Labs  Lab 06/28/17 0301 06/29/17 0239 06/30/17 0248  NA 122* 126* 126*     ASSESSMENT / PLAN:  Right exudative pleural effusion suspect related to postthoracotomy 06/27/2017 Right Ex vacuo Pneumothorax slowly improving Hypoosmolar hyponatremia slowly improving History of paroxysmal atrial fibrillation History of chronic obstructive pulmonary disease.  History of coronary artery disease, recent CABG, with chronic systolic heart  Anemia  Discussion: Sodium slowly improving with gentle saline replacement clinically she feels better.  We performed diagnostic/therapeutic right thoracentesis on 11/7 per the request of thoracic surgery.  The fluid analysis was consistent with exudative fluid collection.  Most likely reflects post thoracotomy changes.  Her right lung did not fully reexpand following thoracentesis.   This appears primarily to be related to pneumothorax ex vacuo, her chest x-ray remains essentially unchanged.  Plan Normal saline Continue supplemental oxygen Repeat a.m. chest x-ray daily We will hold off the tube thoracostomy, given chest x-ray remains stable concern that lung still may not reexpand Pulmonary will see her again on Monday, 07/02/2012 unless needed at which time we can be called.  Brett CanalesSteve Minor ACNP [facility] Pager [phone] till 1 pm If no answer page [phone] 06/30/2017, 8:33 AM

## 2017-06-30 NOTE — Progress Notes (Signed)
[facility] TEAM 1 - Stepdown/ICU TEAM  Samantha DriversKathy D Danford  WUJ:[id number] DOB: October 30, 1950 DOA: 06/26/2017 PCP: Marinda ElkFried, Robert, MD    Brief Narrative:  66 y.o. female with a history of severe COPD just recently on 2-2.5L home O2, and CAD status post CABG on 06/06/17 who presented to the ED w/ weakness and lethargy. Workup revealed hyponatremia, hypokalemia, and metabolic alkalosis. Notably the patient had been undergoing aggressive diuresis postoperatively.  Significant Events: 11/06 - Admit 11/07 - Thoracentesis w/ post-procedure pneumothorax likely due to trapped lung 11/08 - [facility] assumed care   Subjective: The patient is sitting up in a bedside chair resting comfortably.  She denies chest pain or shortness of breath.  She does report that she feels very weak in general and very unstable on her feet.  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 - PCCM following - stable at this time w/o chest tube - no signif change on f/u CXR today - plan per PCCM  Bilateral pleural effusions secondary to previous thoracic surgery - clinically insignificant at this time   Hyponatremia Due to diuretic - improved with gentle volume expansion -stalled with DC of IV fluid -follow for now without change  Hypokalemia Due to diuretic - corrected   Hypomagnesemia Due to diuretic - corrected   Hypophosphatemia Corrected   Acute on chronic Hypoxic respiratory failure - COPD Stable at present - wean O2 as able   Chronic diastolic CHF Weight is stable -mild lower extremity edema persists -follow without IV fluid for now [facility]   06/28/17 1054 06/30/17 0435 06/30/17 0546  Weight: 59 kg (130 lb 1.1 oz) 57.7 kg (127 lb 3.3 oz) 57.7 kg (127 lb 3.3 oz)    CAD status post CABG October 2018 No chest pain presently    Paroxysmal atrial fibrillation NSR at this time   Iron deficiency anemia no evidence of blood loss at this time - cont to follow trend   DVT prophylaxis: SQ heparin  Code  Status: FULL CODE Family Communication: spoke w/ family at bedside   Disposition Plan: SDU - PT/OT   Consultants:  PCCM  Antimicrobials:  None  Objective: Blood pressure (!) 138/58, pulse 70, temperature (!) 97.5 F (36.4 C), temperature source Oral, resp. rate (!) 23, height 5\' 4"  (1.626 m), weight 57.7 kg (127 lb 3.3 oz), SpO2 100 %.  Intake/Output Summary (Last 24 hours) at 06/30/2017 1018 Last data filed at 06/29/2017 2000 Gross per 24 hour  Intake 2152 ml  Output 220 ml  Net 1932 ml   Filed Weights   06/28/17 1054 06/30/17 0435 06/30/17 0546  Weight: 59 kg (130 lb 1.1 oz) 57.7 kg (127 lb 3.3 oz) 57.7 kg (127 lb 3.3 oz)    Examination: General: No acute respiratory distress - alert   Lungs: poor air movement R apex - no wheezing - no focal crackles  Cardiovascular: RRR Abdomen: NT/ND, soft, bs+, no mass Extremities: 1+ B LE R slightly worse than L w/o change   CBC: Recent Labs  Lab 06/26/17 1045 06/27/17 0331 06/29/17 0239 06/30/17 0248  WBC 3.1* 5.3 5.4 4.6  NEUTROABS 2.4  --   --   --   HGB 9.2* 9.1* 8.3* 7.9*  HCT 26.5* 25.8* 24.1* 24.0*  MCV 86.0 86.3 89.6 90.2  PLT 169 160 147* 134*   Basic Metabolic Panel: Recent Labs  Lab 06/26/17 1427  06/27/17 0331 06/27/17 1232 06/28/17 0301 06/28/17 1545 06/29/17 0239 06/30/17 0248  NA  --    < >  120* 121* 122*  --  126* 126*  K  --    < > 3.8 5.0 3.5 4.1 3.8 4.1  CL  --    < > 72* 79* 81*  --  85* 87*  CO2  --    < > 36* 34* 34*  --  33* 33*  GLUCOSE  --    < > 84 114* 91  --  111* 101*  BUN  --    < > 5* 6 <5*  --  <5* <5*  CREATININE  --    < > 0.81 0.72 0.70  --  0.58 0.62  CALCIUM  --    < > 8.0* 8.0* 8.1*  --  8.2* 8.2*  MG  --   --  1.5*  --   --  1.4* 1.9 1.8  PHOS 2.5  --  2.7  --   --  2.3* 4.3  --    < > = values in this interval not displayed.   GFR: Estimated Creatinine Clearance: 60.5 mL/min (by C-G formula based on SCr of 0.62 mg/dL).  Liver Function Tests: Recent Labs  Lab  06/26/17 1045 06/27/17 1540  AST 22  --   ALT 13*  --   ALKPHOS 117  --   BILITOT 1.5*  --   PROT 5.4* 5.1*  ALBUMIN 3.3*  --    Coagulation Profile: Recent Labs  Lab 06/26/17 1045  INR 1.06    CBG: Recent Labs  Lab 06/28/17 1602 06/28/17 2100 06/29/17 0832 06/29/17 1207 06/30/17 0854  GLUCAP 98 122* 115* 116* 122*    Recent Results (from the past 240 hour(s))  Culture, blood (routine x 2)     Status: None (Preliminary result)   Collection Time: 06/26/17 11:15 AM  Result Value Ref Range Status   Specimen Description BLOOD RIGHT ANTECUBITAL  Final   Special Requests   Final    BOTTLES DRAWN AEROBIC AND ANAEROBIC Blood Culture results may not be optimal due to an excessive volume of blood received in culture bottles   Culture NO GROWTH 3 DAYS  Final   Report Status PENDING  Incomplete  Culture, blood (routine x 2)     Status: None (Preliminary result)   Collection Time: 06/26/17 11:25 AM  Result Value Ref Range Status   Specimen Description BLOOD RIGHT HAND  Final   Special Requests   Final    BOTTLES DRAWN AEROBIC AND ANAEROBIC Blood Culture adequate volume   Culture NO GROWTH 3 DAYS  Final   Report Status PENDING  Incomplete  MRSA PCR Screening     Status: None   Collection Time: 06/26/17  3:04 PM  Result Value Ref Range Status   MRSA by PCR NEGATIVE NEGATIVE Final    Comment:        The GeneXpert MRSA Assay (FDA approved for NASAL specimens only), is one component of a comprehensive MRSA colonization surveillance program. It is not intended to diagnose MRSA infection nor to guide or monitor treatment for MRSA infections.   Body fluid culture     Status: None (Preliminary result)   Collection Time: 06/27/17  3:36 PM  Result Value Ref Range Status   Specimen Description FLUID RIGHT PLEURAL  Final   Special Requests NONE  Final   Gram Stain   Final    MODERATE WBC PRESENT, PREDOMINANTLY MONONUCLEAR NO ORGANISMS SEEN    Culture NO GROWTH 3 DAYS  Final    Report Status PENDING  Incomplete  Scheduled Meds: . amiodarone  400 mg Oral Daily  . aspirin  81 mg Oral Daily  . atorvastatin  80 mg Oral q1800  . diphenhydrAMINE  25 mg Oral Once  . ferrous gluconate  324 mg Oral BID WC  . fluticasone furoate-vilanterol  1 puff Inhalation Daily  . heparin subcutaneous  5,000 Units Subcutaneous Q8H  . insulin aspart  0-9 Units Subcutaneous TID WC  . metoprolol tartrate  12.5 mg Oral BID  . montelukast  10 mg Oral QHS  . nicotine  14 mg Transdermal Daily  . tiotropium  18 mcg Inhalation Daily     LOS: 4 days   Lonia BloodJeffrey T. Ahmon Tosi, MD [facility] Office  [phone] Pager - Text Page per Amion as per below:  On-Call/Text Page:      [email or web address]      password [email or web address]  If 7PM-7AM, please contact night-coverage [email or web address] Password [email or web address] 06/30/2017, 10:18 AM

## 2017-07-01 LAB — BODY FLUID CULTURE: Culture: NO GROWTH

## 2017-07-01 LAB — BASIC METABOLIC PANEL
ANION GAP: 4 — AB (ref 5–15)
BUN: <5 mg/dL — ABNORMAL LOW (ref 6–20)
CHLORIDE: 86 mmol/L — AB (ref 101–111)
CO2: 35 mmol/L — AB (ref 22–32)
CREATININE: 0.72 mg/dL (ref 0.44–1.00)
Calcium: 8.4 mg/dL — ABNORMAL LOW (ref 8.9–10.3)
GFR calc non Af Amer: >60 mL/min (ref >60)
GLUCOSE: 103 mg/dL — AB (ref 65–99)
Potassium: 4.4 mmol/L (ref 3.5–5.1)
Sodium: 125 mmol/L — ABNORMAL LOW (ref 135–145)

## 2017-07-01 LAB — CBC
HCT: 24.7 % — ABNORMAL LOW (ref 36.0–46.0)
HEMOGLOBIN: 8 g/dL — AB (ref 12.0–15.0)
MCH: 29.4 pg (ref 26.0–34.0)
MCHC: 32.4 g/dL (ref 30.0–36.0)
MCV: 90.8 fL (ref 78.0–100.0)
Platelets: 138 10*3/uL — ABNORMAL LOW (ref 150–400)
RBC: 2.72 MIL/uL — AB (ref 3.87–5.11)
RDW: 15.1 % (ref 11.5–15.5)
WBC: 4 10*3/uL (ref 4.0–10.5)

## 2017-07-01 LAB — CULTURE, BLOOD (ROUTINE X 2)
Culture: NO GROWTH
Culture: NO GROWTH
Special Requests: ADEQUATE

## 2017-07-01 MED ORDER — FUROSEMIDE 10 MG/ML IJ SOLN
40.0000 mg | Freq: Once | INTRAMUSCULAR | Status: AC
  Administered 2017-07-01: 40 mg via INTRAVENOUS
  Filled 2017-07-01: qty 4

## 2017-07-01 MED ORDER — FUROSEMIDE 40 MG PO TABS
40.0000 mg | ORAL_TABLET | Freq: Every day | ORAL | Status: DC
  Administered 2017-07-02 – 2017-07-03 (×2): 40 mg via ORAL
  Filled 2017-07-01 (×2): qty 1

## 2017-07-01 NOTE — Evaluation (Signed)
Occupational Therapy Evaluation Patient Details Name: Samantha Dyer D Starry MRN: [id number] DOB: Aug 07, 1951 Today's Date: 07/01/2017    History of Present Illness 66 y.o. female with a history of severe COPD just recently on 2-2.5L home O2, and CAD status post CABG on 06/06/17 who presented to the ED w/ weakness and lethargy. Workup revealed hyponatremia, hypokalemia, and metabolic alkalosis. Notably the patient had been undergoing aggressive diuresis postoperatively   Clinical Impression   Pt presented to OT evaluation seated at EOB with stable vital signs although reporting increased work of breathing. Prior to recent CABG in October, pt reports that she was independent with ADL and functional mobility. Since CABG, she has been using RW and requiring assistance for LB ADL. She currently requires overall min guard assist for safety with ADL and functional mobility with RW. She presents with decreased activity tolerance for ADL and generalized weakness. Throughout activity this session pt with SpO2 ranging from 93-99% on 2L O2 with RR ranging from 23-31 respirations per minute. HR stable at 74-76 bpm this session. Pt would benefit from continued OT services while admitted to improve independence with ADL and functional mobility prior to returning home with home health OT follow-up and 24 hour assistance from husband. Will continue to follow.     Follow Up Recommendations  Home health OT;Supervision/Assistance - 24 hour    Equipment Recommendations  None recommended by OT    Recommendations for Other Services       Precautions / Restrictions Precautions Precautions: Fall Precaution Comments: watch O2 saturations Restrictions Weight Bearing Restrictions: No      Mobility Bed Mobility               General bed mobility comments: received sitting EOB  Transfers Overall transfer level: Needs assistance Equipment used: Rolling walker (2 wheeled) Transfers: Sit to/from Stand Sit to  Stand: Min guard;+2 safety/equipment         General transfer comment: min guard for safety and stability +2 for line management    Balance Overall balance assessment: Needs assistance Sitting-balance support: No upper extremity supported Sitting balance-[name]: Fair     Standing balance support: Bilateral upper extremity supported Standing balance-[name]: Fair Standing balance comment: able to static stand but prefers use of AD for comfort                           ADL either performed or assessed with clinical judgement   ADL Overall ADL's : Needs assistance/impaired Eating/Feeding: Sitting;Supervision/ safety   Grooming: Supervision/safety;Sitting   Upper Body Bathing: Min guard;Sitting   Lower Body Bathing: Min guard;Sit to/from stand   Upper Body Dressing : Min guard;Sitting   Lower Body Dressing: Min guard;Sit to/from stand   Toilet Transfer: Min guard;RW;BSC;Ambulation   Toileting- ArchitectClothing Manipulation and Hygiene: Min guard;Sit to/from stand       Functional mobility during ADLs: Min guard;Rolling walker General ADL Comments: Pt limited by decreased activity tolerance as well as generalized weakness.      Vision Patient Visual Report: No change from baseline Vision Assessment?: No apparent visual deficits     Perception     Praxis      Pertinent Vitals/Pain Pain Assessment: No/denies pain     Hand Dominance Right   Extremity/Trunk Assessment Upper Extremity Assessment Upper Extremity Assessment: Generalized weakness   Lower Extremity Assessment Lower Extremity Assessment: Generalized weakness       Communication Communication Communication: No difficulties   Cognition Arousal/Alertness:  Awake/alert Behavior During Therapy: Flat affect Overall Cognitive Status: Within Functional Limits for tasks assessed                                 General Comments: Pt very flat throughout but cognitively  appropriate.   General Comments       Exercises     Shoulder Instructions      Home Living Family/patient expects to be discharged to:: Private residence Living Arrangements: Spouse/significant other Available Help at Discharge: Family Type of Home: House Home Access: Stairs to enter Secretary/administratorntrance Stairs-Number of Steps: 2 Entrance Stairs-Rails: Can reach both Home Layout: One level     Bathroom Shower/Tub: Chief Strategy OfficerTub/shower unit   Bathroom Toilet: Standard     Home Equipment: Environmental consultantWalker - 2 wheels;Bedside commode          Prior Functioning/Environment Level of Independence: Needs assistance  Gait / Transfers Assistance Needed: using a RW for mobility since previous admission ADL's / Homemaking Assistance Needed: assist for LB dressing and bathing   Comments: was independent prior to her CABG, since then she has required assist for LB dressing and bathing        OT Problem List: Decreased strength;Decreased activity tolerance;Impaired balance (sitting and/or standing);Decreased safety awareness;Decreased knowledge of use of DME or AE;Decreased knowledge of precautions;Pain      OT Treatment/Interventions: Self-care/ADL training;Therapeutic exercise;DME and/or AE instruction;Energy conservation;Therapeutic activities;Patient/family education;Balance training    OT Goals(Current goals can be found in the care plan section) Acute Rehab OT Goals Patient Stated Goal: to go home OT Goal Formulation: With patient/family Time For Goal Achievement: 07/15/17 Potential to Achieve Goals: Good ADL Goals Pt Will Perform Grooming: with modified independence;standing Pt Will Perform Upper Body Dressing: with modified independence;sitting Pt Will Perform Lower Body Dressing: sit to/from stand;with modified independence Pt Will Transfer to Toilet: ambulating;bedside commode;with modified independence(BSC over toilet) Pt Will Perform Toileting - Clothing Manipulation and hygiene: with modified  independence;sit to/from stand  OT Frequency: Min 2X/week   Barriers to D/C:            Co-evaluation PT/OT/SLP Co-Evaluation/Treatment: (partial; dovetailed end of session for lines)            AM-PAC PT "6 Clicks" Daily Activity     Outcome Measure Help from another person eating meals?: A Little Help from another person taking care of personal grooming?: A Little Help from another person toileting, which includes using toliet, bedpan, or urinal?: A Little Help from another person bathing (including washing, rinsing, drying)?: A Little Help from another person to put on and taking off regular upper body clothing?: A Little Help from another person to put on and taking off regular lower body clothing?: A Little 6 Click Score: 18   End of Session Equipment Utilized During Treatment: Gait belt;Rolling walker Nurse Communication: Mobility status  Activity Tolerance: Patient tolerated treatment well Patient left: in chair;with call bell/phone within reach;with family/visitor present  OT Visit Diagnosis: Other abnormalities of gait and mobility (R26.89);Muscle weakness (generalized) (M62.81)                Time: [id number] OT Time Calculation (min): 16 min Charges:  OT General Charges $OT Visit: 1 Visit OT Evaluation $OT Eval Moderate Complexity: 1 Mod G-Codes:     Doristine Sectionharity A Chi Garlow, MS OTR/L  Pager: [phone]   Anjeanette Petzold A Buryl Bamber 07/01/2017, 3:31 PM

## 2017-07-01 NOTE — Progress Notes (Signed)
[facility] TEAM 1 - Stepdown/ICU TEAM  Chelcea Zahn Sapien  ZOX:[id number] DOB: [birth year] DOA: 06/26/2017 PCP: Marinda Elk, MD    Brief Narrative:  66 y.o. female with a history of severe COPD just recently on 2-2.5L home O2, and CAD status post CABG on 06/06/17 who presented to the ED w/ weakness and lethargy. Workup revealed hyponatremia, hypokalemia, and metabolic alkalosis. Notably the patient had been undergoing aggressive diuresis postoperatively.  Significant Events: 11/06 - Admit 11/07 - Thoracentesis w/ post-procedure pneumothorax likely due to trapped lung 11/08 - [facility] assumed care   Subjective: Up walking around the unit w/ PT today.  Reports that she is less sob.  Denies cp, n/v, or abdom pain.    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 - PCCM following - stable at this time w/o chest tube - plan per PCCM - for f/u CXR in AM   Bilateral pleural effusions secondary to previous thoracic surgery - clinically insignificant at this time - f/u CXR in AM   Hyponatremia initially improved with gentle volume expansion - stalled with DC of IV fluid - resume low dose furosemide and follow trend as pt now appears mildly overloaded   Hypokalemia Due to diuretic - corrected   Hypomagnesemia Due to diuretic - corrected   Hypophosphatemia Corrected   Acute on chronic Hypoxic respiratory failure - COPD Stable at present - wean O2 as able - presently on her home does of 2.5L w/ good sats   Chronic diastolic CHF Weight is stable -mild lower extremity edema persists -resume low dose diuretic and follow  Filed Weights   06/30/17 0435 06/30/17 0546 07/01/17 0328  Weight: 57.7 kg (127 lb 3.3 oz) 57.7 kg (127 lb 3.3 oz) 57.6 kg (126 lb 15.8 oz)    CAD status post CABG October 2018 No chest pain presently    Paroxysmal atrial fibrillation NSR at this time   Iron deficiency anemia no evidence of blood loss at this time - cont to follow trend   DVT prophylaxis: SQ heparin    Code Status: FULL CODE Family Communication: spoke w/ husband at bedside   Disposition Plan: SDU - PT/OT   Consultants:  PCCM  Antimicrobials:  None  Objective: Blood pressure (!) 136/50, pulse 73, temperature (!) 97.3 F (36.3 C), temperature source Oral, resp. rate (!) 26, height 5\' 4"  (1.626 m), weight 57.6 kg (126 lb 15.8 oz), SpO2 100 %.  Intake/Output Summary (Last 24 hours) at 07/01/2017 1428 Last data filed at 07/01/2017 1300 Gross per 24 hour  Intake 960 ml  Output -  Net 960 ml   Filed Weights   06/30/17 0435 06/30/17 0546 07/01/17 0328  Weight: 57.7 kg (127 lb 3.3 oz) 57.7 kg (127 lb 3.3 oz) 57.6 kg (126 lb 15.8 oz)    Examination: General: No acute respiratory distress - alert and interactive  Lungs: poor air movement R apex w/o wheezing or crackles  Cardiovascular: RRR - no rub  Abdomen: NT/ND, soft, bs+, no mass Extremities: 1+ R LE edema - trace L LE edema   CBC: Recent Labs  Lab 06/26/17 1045 06/27/17 0331 06/29/17 0239 06/30/17 0248 07/01/17 0413  WBC 3.1* 5.3 5.4 4.6 4.0  NEUTROABS 2.4  --   --   --   --   HGB 9.2* 9.1* 8.3* 7.9* 8.0*  HCT 26.5* 25.8* 24.1* 24.0* 24.7*  MCV 86.0 86.3 89.6 90.2 90.8  PLT 169 160 147* 134* 138*   Basic Metabolic Panel: Recent  Labs  Lab 06/26/17 1427  06/27/17 0331 06/27/17 1232 06/28/17 0301 06/28/17 1545 06/29/17 0239 06/30/17 0248 07/01/17 0413  NA  --    < > 120* 121* 122*  --  126* 126* 125*  K  --    < > 3.8 5.0 3.5 4.1 3.8 4.1 4.4  CL  --    < > 72* 79* 81*  --  85* 87* 86*  CO2  --    < > 36* 34* 34*  --  33* 33* 35*  GLUCOSE  --    < > 84 114* 91  --  111* 101* 103*  BUN  --    < > 5* 6 <5*  --  <5* <5* <5*  CREATININE  --    < > 0.81 0.72 0.70  --  0.58 0.62 0.72  CALCIUM  --    < > 8.0* 8.0* 8.1*  --  8.2* 8.2* 8.4*  MG  --   --  1.5*  --   --  1.4* 1.9 1.8  --   PHOS 2.5  --  2.7  --   --  2.3* 4.3  --   --    < > = values in this interval not displayed.   GFR: Estimated Creatinine  Clearance: 60.5 mL/min (by C-G formula based on SCr of 0.72 mg/dL).  Liver Function Tests: Recent Labs  Lab 06/26/17 1045 06/27/17 1540  AST 22  --   ALT 13*  --   ALKPHOS 117  --   BILITOT 1.5*  --   PROT 5.4* 5.1*  ALBUMIN 3.3*  --    Coagulation Profile: Recent Labs  Lab 06/26/17 1045  INR 1.06    CBG: Recent Labs  Lab 06/29/17 0832 06/29/17 1207 06/30/17 0854 06/30/17 1215 06/30/17 2150  GLUCAP 115* 116* 122* 147* 139*    Recent Results (from the past 240 hour(s))  Culture, blood (routine x 2)     Status: None   Collection Time: 06/26/17 11:15 AM  Result Value Ref Range Status   Specimen Description BLOOD RIGHT ANTECUBITAL  Final   Special Requests   Final    BOTTLES DRAWN AEROBIC AND ANAEROBIC Blood Culture results may not be optimal due to an excessive volume of blood received in culture bottles   Culture NO GROWTH 5 DAYS  Final   Report Status 07/01/2017 FINAL  Final  Culture, blood (routine x 2)     Status: None   Collection Time: 06/26/17 11:25 AM  Result Value Ref Range Status   Specimen Description BLOOD RIGHT HAND  Final   Special Requests   Final    BOTTLES DRAWN AEROBIC AND ANAEROBIC Blood Culture adequate volume   Culture NO GROWTH 5 DAYS  Final   Report Status 07/01/2017 FINAL  Final  MRSA PCR Screening     Status: None   Collection Time: 06/26/17  3:04 PM  Result Value Ref Range Status   MRSA by PCR NEGATIVE NEGATIVE Final    Comment:        The GeneXpert MRSA Assay (FDA approved for NASAL specimens only), is one component of a comprehensive MRSA colonization surveillance program. It is not intended to diagnose MRSA infection nor to guide or monitor treatment for MRSA infections.   Body fluid culture     Status: None   Collection Time: 06/27/17  3:36 PM  Result Value Ref Range Status   Specimen Description FLUID RIGHT PLEURAL  Final   Special Requests NONE  Final   Gram Stain   Final    MODERATE WBC PRESENT, PREDOMINANTLY  MONONUCLEAR NO ORGANISMS SEEN    Culture NO GROWTH 3 DAYS  Final   Report Status 07/01/2017 FINAL  Final     Scheduled Meds: . amiodarone  400 mg Oral Daily  . aspirin  81 mg Oral Daily  . atorvastatin  80 mg Oral q1800  . ferrous gluconate  324 mg Oral BID WC  . fluticasone furoate-vilanterol  1 puff Inhalation Daily  . heparin subcutaneous  5,000 Units Subcutaneous Q8H  . metoprolol tartrate  12.5 mg Oral BID  . montelukast  10 mg Oral QHS  . nicotine  14 mg Transdermal Daily  . tiotropium  18 mcg Inhalation Daily     LOS: 5 days   Lonia BloodJeffrey T. McClung, MD [facility] Office  [phone] Pager - Text Page per Amion as per below:  On-Call/Text Page:      [email or web address] [email or web address]      password [email or web address]  If 7PM-7AM, please contact night-coverage [email or web address] Password [email or web address] 07/01/2017, 2:28 PM

## 2017-07-01 NOTE — Evaluation (Signed)
Physical Therapy Evaluation Patient Details Name: Samantha Dyer [place] MRN: [id number] DOB: June 29, 1951 Today's Date: 07/01/2017   History of Present Illness  66 y.o. female with a history of severe COPD just recently on 2-2.5L home O2, and CAD status post CABG on 06/06/17 who presented to the ED w/ weakness and lethargy. Workup revealed hyponatremia, hypokalemia, and metabolic alkalosis. Notably the patient had been undergoing aggressive diuresis postoperatively  Clinical Impression  Orders received for PT evaluation. Patient demonstrates deficits in functional mobility as indicated below. Will benefit from continued skilled PT to address deficits and maximize function. Will see as indicated and progress as tolerated.      Follow Up Recommendations Home health PT;Supervision for mobility/OOB    Equipment Recommendations  None recommended by PT    Recommendations for Other Services       Precautions / Restrictions Precautions Precautions: Fall Precaution Comments: watch O2 saturations Restrictions Weight Bearing Restrictions: No      Mobility  Bed Mobility               General bed mobility comments: received sitting EOB  Transfers Overall transfer level: Needs assistance Equipment used: Rolling walker (2 wheeled) Transfers: Sit to/from Stand Sit to Stand: Min guard         General transfer comment: min guard for safety and stability +2 for line management  Ambulation/Gait Ambulation/Gait assistance: Min guard Ambulation Distance (Feet): 180 Feet Assistive device: Rolling walker (2 wheeled) Gait Pattern/deviations: Step-through pattern;Decreased stride length;Drifts right/left Gait velocity: decreased Gait velocity interpretation: Below normal speed for age/gender General Gait Details: steady with use of RW for UE support, cued for positioning within RW  Stairs            Wheelchair Mobility    Modified Rankin (Stroke Patients Only)       Balance  Overall balance assessment: Needs assistance Sitting-balance support: No upper extremity supported Sitting balance-[name]: Fair       Standing balance-[name]: Fair Standing balance comment: able to static stand but prefers use of AD for comfort                             Pertinent Vitals/Pain Pain Assessment: No/denies pain    Home Living Family/patient expects to be discharged to:: Private residence Living Arrangements: Spouse/significant other Available Help at Discharge: Family Type of Home: House Home Access: Stairs to enter Entrance Stairs-Rails: Can reach both Entrance Stairs-Number of Steps: 2 Home Layout: One level Home Equipment: Walker - 2 wheels;Bedside commode      Prior Function Level of Independence: Needs assistance   Gait / Transfers Assistance Needed: using a RW for mobility since previous admission  ADL's / Homemaking Assistance Needed: assist for LB dressing and bathing  Comments: was independent prior to her CABG, since then she has required assist for LB dressing and bathing     Hand Dominance   Dominant Hand: Right    Extremity/Trunk Assessment   Upper Extremity Assessment Upper Extremity Assessment: Generalized weakness    Lower Extremity Assessment Lower Extremity Assessment: Generalized weakness       Communication   Communication: No difficulties  Cognition Arousal/Alertness: Awake/alert Behavior During Therapy: Flat affect Overall Cognitive Status: Within Functional Limits for tasks assessed  General Comments      Exercises     Assessment/Plan    PT Assessment Patient needs continued PT services  PT Problem List Decreased strength;Decreased activity tolerance;Decreased balance;Decreased mobility       PT Treatment Interventions DME instruction;Functional mobility training;Therapeutic activities;Therapeutic exercise;Balance  training;Patient/family education    PT Goals (Current goals can be found in the Care Plan section)  Acute Rehab PT Goals Patient Stated Goal: to go home PT Goal Formulation: With patient/family Time For Goal Achievement: 07/15/17 Potential to Achieve Goals: Good    Frequency Min 3X/week   Barriers to discharge        Co-evaluation PT/OT/SLP Co-Evaluation/Treatment: (dovetailed session)             AM-PAC PT "6 Clicks" Daily Activity  Outcome Measure Difficulty turning over in bed (including adjusting bedclothes, sheets and blankets)?: A Little Difficulty moving from lying on back to sitting on the side of the bed? : A Little Difficulty sitting down on and standing up from a chair with arms (e.g., wheelchair, bedside commode, etc,.)?: A Little Help needed moving to and from a bed to chair (including a wheelchair)?: A Little Help needed walking in hospital room?: A Little Help needed climbing 3-5 steps with a railing? : A Lot 6 Click Score: 17    End of Session Equipment Utilized During Treatment: Gait belt;Oxygen(2 liters) Activity Tolerance: Patient limited by fatigue Patient left: in chair;with call bell/phone within reach;with family/visitor present Nurse Communication: Mobility status PT Visit Diagnosis: Muscle weakness (generalized) (M62.81);Difficulty in walking, not elsewhere classified (R26.2)    Time: [id number] PT Time Calculation (min) (ACUTE ONLY): 11 min   Charges:   PT Evaluation $PT Eval Moderate Complexity: 1 Mod     PT G Codes:        Charlotte Crumbevon Tiaja Hagan, PT DPT  Board Certified Neurologic Specialist [phone]   Fabio AsaDevon J Sandrina Heaton 07/01/2017, 2:53 PM

## 2017-07-02 ENCOUNTER — Inpatient Hospital Stay (HOSPITAL_COMMUNITY): Payer: Medicare Other

## 2017-07-02 DIAGNOSIS — I959 Hypotension, unspecified: Secondary | ICD-10-CM

## 2017-07-02 DIAGNOSIS — E876 Hypokalemia: Secondary | ICD-10-CM

## 2017-07-02 DIAGNOSIS — J441 Chronic obstructive pulmonary disease with (acute) exacerbation: Secondary | ICD-10-CM

## 2017-07-02 LAB — BASIC METABOLIC PANEL
Anion gap: 10 (ref 5–15)
BUN: 5 mg/dL — AB (ref 6–20)
CHLORIDE: 85 mmol/L — AB (ref 101–111)
CO2: 31 mmol/L (ref 22–32)
CREATININE: 0.74 mg/dL (ref 0.44–1.00)
Calcium: 8.3 mg/dL — ABNORMAL LOW (ref 8.9–10.3)
GFR calc Af Amer: >60 mL/min (ref >60)
GFR calc non Af Amer: >60 mL/min (ref >60)
Glucose, Bld: 97 mg/dL (ref 65–99)
POTASSIUM: 4.6 mmol/L (ref 3.5–5.1)
Sodium: 126 mmol/L — ABNORMAL LOW (ref 135–145)

## 2017-07-02 MED ORDER — TRAMADOL HCL 50 MG PO TABS: 50.0000 mg | ORAL_TABLET | Freq: Four times a day (QID) | ORAL | Status: DC

## 2017-07-02 MED ORDER — ACETAMINOPHEN 325 MG PO TABS: 650.0000 mg | ORAL_TABLET | Freq: Four times a day (QID) | ORAL | Status: DC | PRN

## 2017-07-02 MED ORDER — TRAMADOL HCL 50 MG PO TABS
50.0000 mg | ORAL_TABLET | Freq: Four times a day (QID) | ORAL | Status: DC | PRN
  Administered 2017-07-02: 50 mg via ORAL
  Filled 2017-07-02: qty 1

## 2017-07-02 NOTE — Progress Notes (Signed)
Name: Samantha Dyer MRN: [id number] DOB: 1951-06-10    ADMISSION DATE:  06/26/2017 CONSULTATION DATE: 11/6  REFERRING MD : Clarice PolePfeifer  CHIEF COMPLAINT: Hyponatremia  BRIEF PATIENT DESCRIPTION:  This is a 66 year old white female with a known history of chronic obstructive pulmonary disease and recent coronary artery bypass grafting surgery on 10/17.  Presented to the emergency room on 10/6 with chief complaint of weakness and lethargy.  Diagnostic evaluation in the emergency room yielded a serum sodium of 116, and a potassium of 2.4 with a bicarbonate of 49.  Apparently her postop course was remarkable for aggressive diuresis in the setting of depressed ejection fraction of 40-45%.  Her serum bicarbonate was noted to be 49 on presentation she was admitted to the intensive care due to her severe metabolic disarray.  SIGNIFICANT EVENTS  On 2 liters 100% sat  STUDIES:  BNP: 484 Cortisol 23.2 Serum osmole 242  11/7 right thoracentesis 500 mL; serum protein 5.1 serum LDH 175 Pleural fluid culture>>> Cytology>>. Pleural glucose 109, pleural cell count red, turbid, white blood cells unable to perform.  CT chest obtained 11/7: Moderate right pleural effusion at right lung base.  Incomplete reexpansion of the right middle and lower lobes.  No mediastinal shift.  Small bilateral pleural effusions.  Cardiomegaly.  SUBJECTIVE:  On 2 liters 100% sat  VITAL SIGNS: Temp:  [97.6 F (36.4 C)-98.1 F (36.7 C)] 98.1 F (36.7 C) (11/12 1200) Pulse Rate:  [59-121] 65 (11/12 1200) Resp:  [9-22] 22 (11/12 1200) BP: (110-157)/(48-97) 147/59 (11/12 1200) SpO2:  [98 %-100 %] 100 % (11/12 1200) Weight:  [57.3 kg (126 lb 5.2 oz)] 57.3 kg (126 lb 5.2 oz) (11/12 0343)  Intake/Output Summary (Last 24 hours) at 07/02/2017 1447 Last data filed at 07/02/2017 0900 Gross per 24 hour  Intake 460 ml  Output 1950 ml  Net -1490 ml   2 L nasal cannula PHYSICAL EXAMINATION: General:awake ,s ome distress  upright after exertion Neuro: awake, alert, o x 3 HEENT: jvd wnl PULM: reduced and coarse rt lung filed, reduced left CV:  s1 s12 RRR  GI: soft, bs wnl, no r Extremities:  No edema   Recent Labs  Lab 06/30/17 0248 07/01/17 0413 07/02/17 0349  NA 126* 125* 126*  K 4.1 4.4 4.6  CL 87* 86* 85*  CO2 33* 35* 31  BUN <5* <5* 5*  CREATININE 0.62 0.72 0.74  GLUCOSE 101* 103* 97   Recent Labs  Lab 06/29/17 0239 06/30/17 0248 07/01/17 0413  HGB 8.3* 7.9* 8.0*  HCT 24.1* 24.0* 24.7*  WBC 5.4 4.6 4.0  PLT 147* 134* 138*   Dg Chest Port 1 View  Result Date: 07/02/2017 CLINICAL DATA:  66 year old female with pneumothorax. Prior thoracentesis. Subsequent encounter. EXAM: PORTABLE CHEST 1 VIEW COMPARISON:  06/30/2017 chest x-ray. FINDINGS: Moderately large right-sided hydropneumothorax. This may represent pneumothorax ex vacuo with minimal decrease in size of pneumothorax component although increase in size of pleural effusion component. Consolidation right mid lower lobe may represent atelectasis or infiltrate. Underlying mass not excluded. Mild pulmonary vascular prominence. Small left-sided pleural effusion/ left base atelectasis. Post CABG.  Cardiomegaly. Calcified aorta. IMPRESSION: Moderately large right-sided hydropneumothorax. This may represent pneumothorax ex vacuo with minimal decrease in size of pneumothorax component although increase in size of pleural effusion component. Consolidation right mid lower lobe may represent atelectasis or infiltrate. Mild pulmonary vascular prominence. Small left-sided pleural effusion/ left base atelectasis. Cardiomegaly post CABG. Aortic Atherosclerosis (ICD10-I70.0). Electronically Signed   By:  Lacy DuverneySteven  Olson M.D.   On: 07/02/2017 07:36   Portable chest x-ray reviewed: No significant change in right-sided pneumothorax, now with small amount of right-sided pleural fluid  ASSESSMENT / PLAN:  Right exudative pleural effusion suspect related to  postthoracotomy Right Ex vacuo Pneumothorax Hypoosmolar hyponatremia History of paroxysmal atrial fibrillation History of chronic obstructive pulmonary disease.  History of coronary artery disease, recent CABG, with chronic systolic heart  Anemia  Plan Not much has changed radiographic ally or symptoms with her hydroptx I feel that we should consider chest tube drainage and see if could improve aeration of lung and open Maintain neg balance lasix Chem in am  Get coags for am in case chest tube needed I will d/w cvts  Will assess pcxr again in am and make decision for tube drainage attempt Pt is aware that it still MAY NOT imrprove trap lung  Mcarthur Rossettianiel J. Tyson AliasFeinstein, MD, FACP Pgr: [phone]  Pulmonary & Critical Care 07/02/2017 2:47 PM

## 2017-07-02 NOTE — Plan of Care (Signed)
No significant changes in patient status or care, pt is compliant and eager to continue advancing health towards a transition home.

## 2017-07-02 NOTE — Progress Notes (Signed)
[facility] TEAM 1 - Stepdown/ICU TEAM  Samantha DriversKathy D Dyer  OZH:[id number] DOB: [birth year] DOA: 06/26/2017 PCP: Marinda ElkFried, Robert, MD    Brief Narrative:  66 y.o. female with a history of severe COPD just recently on 2-2.5L home O2, and CAD status post CABG on 06/06/17 who presented to the ED w/ weakness and lethargy. Workup revealed hyponatremia, hypokalemia, and metabolic alkalosis. Notably the patient had been undergoing aggressive diuresis postoperatively.  Significant Events: 11/06 - Admit 11/07 - Thoracentesis w/ post-procedure pneumothorax likely due to trapped lung 11/08 - [facility] assumed care   Subjective: Reports that she feels somewhat SOB today.  Denies n/v, abdom pain, or cp.  Is willing to try a chest tube if we think it will help her SOB.    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 - PCCM following - unchanged in f/u - PCCM to consider a chest tube in the AM   Bilateral pleural effusions secondary to previous thoracic surgery - clinically insignificant at this time    Hyponatremia initially improved with gentle volume expansion - stalled with DC of IV fluid - improving further w/ diuresis - follow   Hypokalemia Due to diuretic - corrected   Hypomagnesemia Due to diuretic - corrected   Hypophosphatemia Corrected   Acute on chronic Hypoxic respiratory failure - COPD Stable at present - wean O2 as able - presently on her home dose of 2.5L w/ good sats   Chronic diastolic CHF Cont low dose diuretic and follow   Filed Weights   06/30/17 0546 07/01/17 0328 07/02/17 0343  Weight: 57.7 kg (127 lb 3.3 oz) 57.6 kg (126 lb 15.8 oz) 57.3 kg (126 lb 5.2 oz)    CAD status post CABG October 2018 No chest pain presently    Paroxysmal atrial fibrillation NSR th/o this hospital stay - appears to have been a transient post-operative issue at time of CABG   Iron deficiency anemia no evidence of blood loss at this time - cont to follow trend   DVT prophylaxis: SQ heparin    Code Status: FULL CODE Family Communication: no family present at time of exam   Disposition Plan: SDU pending possible chest tube - PT/OT   Consultants:  PCCM  Antimicrobials:  None  Objective: Blood pressure (!) 147/59, pulse 65, temperature 98.1 F (36.7 C), temperature source Oral, resp. rate (!) 22, height 5\' 4"  (1.626 m), weight 57.3 kg (126 lb 5.2 oz), SpO2 100 %.  Intake/Output Summary (Last 24 hours) at 07/02/2017 1525 Last data filed at 07/02/2017 0900 Gross per 24 hour  Intake 460 ml  Output 1950 ml  Net -1490 ml   Filed Weights   06/30/17 0546 07/01/17 0328 07/02/17 0343  Weight: 57.7 kg (127 lb 3.3 oz) 57.6 kg (126 lb 15.8 oz) 57.3 kg (126 lb 5.2 oz)    Examination: General: No acute respiratory distress  Lungs: poor air movement R apex - no wheezing or crackles  Cardiovascular: RRR w/o M or rub  Abdomen: NT/ND, soft, bs+, no mass Extremities: trace B LE edema   CBC: Recent Labs  Lab 06/26/17 1045 06/27/17 0331 06/29/17 0239 06/30/17 0248 07/01/17 0413  WBC 3.1* 5.3 5.4 4.6 4.0  NEUTROABS 2.4  --   --   --   --   HGB 9.2* 9.1* 8.3* 7.9* 8.0*  HCT 26.5* 25.8* 24.1* 24.0* 24.7*  MCV 86.0 86.3 89.6 90.2 90.8  PLT 169 160 147* 134* 138*   Basic Metabolic Panel: Recent Labs  Lab 06/26/17 1427  06/27/17 0331  06/28/17 0301 06/28/17 1545 06/29/17 0239 06/30/17 0248 07/01/17 0413 07/02/17 0349  NA  --    < > 120*   < > 122*  --  126* 126* 125* 126*  K  --    < > 3.8   < > 3.5 4.1 3.8 4.1 4.4 4.6  CL  --    < > 72*   < > 81*  --  85* 87* 86* 85*  CO2  --    < > 36*   < > 34*  --  33* 33* 35* 31  GLUCOSE  --    < > 84   < > 91  --  111* 101* 103* 97  BUN  --    < > 5*   < > <5*  --  <5* <5* <5* 5*  CREATININE  --    < > 0.81   < > 0.70  --  0.58 0.62 0.72 0.74  CALCIUM  --    < > 8.0*   < > 8.1*  --  8.2* 8.2* 8.4* 8.3*  MG  --   --  1.5*  --   --  1.4* 1.9 1.8  --   --   PHOS 2.5  --  2.7  --   --  2.3* 4.3  --   --   --    < > = values in  this interval not displayed.   GFR: Estimated Creatinine Clearance: 60.5 mL/min (by C-G formula based on SCr of 0.74 mg/dL).  Liver Function Tests: Recent Labs  Lab 06/26/17 1045 06/27/17 1540  AST 22  --   ALT 13*  --   ALKPHOS 117  --   BILITOT 1.5*  --   PROT 5.4* 5.1*  ALBUMIN 3.3*  --    Coagulation Profile: Recent Labs  Lab 06/26/17 1045  INR 1.06    CBG: Recent Labs  Lab 06/29/17 0832 06/29/17 1207 06/30/17 0854 06/30/17 1215 06/30/17 2150  GLUCAP 115* 116* 122* 147* 139*    Recent Results (from the past 240 hour(s))  Culture, blood (routine x 2)     Status: None   Collection Time: 06/26/17 11:15 AM  Result Value Ref Range Status   Specimen Description BLOOD RIGHT ANTECUBITAL  Final   Special Requests   Final    BOTTLES DRAWN AEROBIC AND ANAEROBIC Blood Culture results may not be optimal due to an excessive volume of blood received in culture bottles   Culture NO GROWTH 5 DAYS  Final   Report Status 07/01/2017 FINAL  Final  Culture, blood (routine x 2)     Status: None   Collection Time: 06/26/17 11:25 AM  Result Value Ref Range Status   Specimen Description BLOOD RIGHT HAND  Final   Special Requests   Final    BOTTLES DRAWN AEROBIC AND ANAEROBIC Blood Culture adequate volume   Culture NO GROWTH 5 DAYS  Final   Report Status 07/01/2017 FINAL  Final  MRSA PCR Screening     Status: None   Collection Time: 06/26/17  3:04 PM  Result Value Ref Range Status   MRSA by PCR NEGATIVE NEGATIVE Final    Comment:        The GeneXpert MRSA Assay (FDA approved for NASAL specimens only), is one component of a comprehensive MRSA colonization surveillance program. It is not intended to diagnose MRSA infection nor to guide or monitor treatment for MRSA infections.  Body fluid culture     Status: None   Collection Time: 06/27/17  3:36 PM  Result Value Ref Range Status   Specimen Description FLUID RIGHT PLEURAL  Final   Special Requests NONE  Final   Gram  Stain   Final    MODERATE WBC PRESENT, PREDOMINANTLY MONONUCLEAR NO ORGANISMS SEEN    Culture NO GROWTH 3 DAYS  Final   Report Status 07/01/2017 FINAL  Final     Scheduled Meds: . amiodarone  400 mg Oral Daily  . aspirin  81 mg Oral Daily  . atorvastatin  80 mg Oral q1800  . ferrous gluconate  324 mg Oral BID WC  . fluticasone furoate-vilanterol  1 puff Inhalation Daily  . furosemide  40 mg Oral Daily  . heparin subcutaneous  5,000 Units Subcutaneous Q8H  . metoprolol tartrate  12.5 mg Oral BID  . montelukast  10 mg Oral QHS  . nicotine  14 mg Transdermal Daily  . tiotropium  18 mcg Inhalation Daily     LOS: 6 days   Lonia Blood, MD [facility] Office  [phone] Pager - Text Page per Amion as per below:  On-Call/Text Page:      [email or web address]      password [email or web address]  If 7PM-7AM, please contact night-coverage [email or web address] Password [email or web address] 07/02/2017, 3:25 PM

## 2017-07-03 ENCOUNTER — Inpatient Hospital Stay (HOSPITAL_COMMUNITY): Payer: Medicare Other

## 2017-07-03 LAB — BASIC METABOLIC PANEL
Anion gap: 7 (ref 5–15)
CO2: 35 mmol/L — ABNORMAL HIGH (ref 22–32)
CREATININE: 0.69 mg/dL (ref 0.44–1.00)
Calcium: 8.2 mg/dL — ABNORMAL LOW (ref 8.9–10.3)
Chloride: 84 mmol/L — ABNORMAL LOW (ref 101–111)
GFR calc Af Amer: >60 mL/min (ref >60)
Glucose, Bld: 89 mg/dL (ref 65–99)
Potassium: 4.3 mmol/L (ref 3.5–5.1)
SODIUM: 126 mmol/L — AB (ref 135–145)

## 2017-07-03 LAB — APTT: APTT: 38 s — AB (ref 24–36)

## 2017-07-03 LAB — PROTIME-INR
INR: 1.03
PROTHROMBIN TIME: 13.4 s (ref 11.4–15.2)

## 2017-07-03 MED ORDER — LACTULOSE 10 GM/15ML PO SOLN
10.0000 g | Freq: Every day | ORAL | Status: DC | PRN
  Filled 2017-07-03: qty 15

## 2017-07-03 MED ORDER — POLYETHYLENE GLYCOL 3350 17 G PO PACK
17.0000 g | PACK | Freq: Every day | ORAL | Status: DC
  Administered 2017-07-04: 17 g via ORAL
  Filled 2017-07-03 (×2): qty 1

## 2017-07-03 MED ORDER — FUROSEMIDE 10 MG/ML IJ SOLN: 60.0000 mg | Freq: Two times a day (BID) | INTRAMUSCULAR | Status: DC

## 2017-07-03 MED ORDER — SENNOSIDES-DOCUSATE SODIUM 8.6-50 MG PO TABS
1.0000 | ORAL_TABLET | Freq: Two times a day (BID) | ORAL | Status: DC
Start: 2017-07-03 — End: 2017-07-05
  Administered 2017-07-03 – 2017-07-05 (×4): 1 via ORAL
  Filled 2017-07-03 (×4): qty 1

## 2017-07-03 MED ORDER — FUROSEMIDE 10 MG/ML IJ SOLN
80.0000 mg | Freq: Two times a day (BID) | INTRAMUSCULAR | Status: AC
  Administered 2017-07-03 – 2017-07-04 (×3): 80 mg via INTRAVENOUS
  Filled 2017-07-03 (×3): qty 8

## 2017-07-03 NOTE — Progress Notes (Signed)
Patient's husband at bedside and states "She wants me to give her a extra strength tylenol" This nurse informed the husband not to bring the medication in. This nurse educated husband on the reasons why not to bring medication in. Husband stated " I understand I will not bring it in." Will inform receiving nurse.

## 2017-07-03 NOTE — Progress Notes (Signed)
[facility] TEAM 1 - Stepdown/ICU TEAM  Samantha DriversKathy D Dyer  VHQ:[id number] DOB: 1950-12-28 DOA: 06/26/2017 PCP: Marinda ElkFried, Robert, MD    Brief Narrative:  66 y.o. female with a history of severe COPD just recently on 2-2.5L home O2, and CAD status post CABG on 06/06/17 who presented to the ED w/ weakness and lethargy. Workup revealed hyponatremia, hypokalemia, and metabolic alkalosis. Notably the patient had been undergoing aggressive diuresis postoperatively.  Significant Events: 11/06 - Admit 11/07 - Thoracentesis w/ post-procedure pneumothorax likely due to trapped lung 11/08 - [facility] assumed care   Subjective: No signif change in her sx today.  Denies cp, n/v, or abdom pain.    Assessment & Plan:  R PTX PCCM following - unchanged in f/u - PCCM to consider a chest tube when they visit her today   Bilateral pleural effusions secondary to previous thoracic surgery - clinically insignificant at this time    Hyponatremia initially improved with gentle volume expansion - stalled with DC of IV fluid - cont to follow w/ ongoing diuresis   Hypokalemia Due to diuretic - corrected   Hypomagnesemia Due to diuretic - corrected   Hypophosphatemia Corrected   Acute on chronic Hypoxic respiratory failure - COPD Stable at present - wean O2 as able - presently on her home dose of 2.5L w/ good sats   Chronic diastolic CHF Peripheral edema appears to be worsening - increase diuretic and follow  Filed Weights   07/01/17 0328 07/02/17 0343 07/03/17 0600  Weight: 57.6 kg (126 lb 15.8 oz) 57.3 kg (126 lb 5.2 oz) 57.3 kg (126 lb 5.2 oz)    CAD status post CABG October 2018 No chest pain presently    Paroxysmal atrial fibrillation NSR th/o this hospital stay - appears to have been a transient post-operative issue at time of CABG - was not placed on anticoag previously   Iron deficiency anemia no evidence of blood loss at this time - cont to follow trend   DVT prophylaxis: SQ heparin  Code  Status: FULL CODE Family Communication: no family present at time of exam   Disposition Plan: SDU pending possible chest tube - PT/OT   Consultants:  PCCM  Antimicrobials:  None  Objective: Blood pressure (!) 152/59, pulse 86, temperature 97.8 F (36.6 C), temperature source Oral, resp. rate 17, height 5\' 4"  (1.626 m), weight 57.3 kg (126 lb 5.2 oz), SpO2 99 %.  Intake/Output Summary (Last 24 hours) at 07/03/2017 1137 Last data filed at 07/03/2017 0606 Gross per 24 hour  Intake 1080 ml  Output 900 ml  Net 180 ml   Filed Weights   07/01/17 0328 07/02/17 0343 07/03/17 0600  Weight: 57.6 kg (126 lb 15.8 oz) 57.3 kg (126 lb 5.2 oz) 57.3 kg (126 lb 5.2 oz)    Examination: General: No acute respiratory distress - alert  Lungs: poor air movement R apex w/o change - no wheezing  Cardiovascular: RRR  Abdomen: NT/ND, soft, bs+, no mass Extremities: 1+ pitting B LE edema   CBC: Recent Labs  Lab 06/27/17 0331 06/29/17 0239 06/30/17 0248 07/01/17 0413  WBC 5.3 5.4 4.6 4.0  HGB 9.1* 8.3* 7.9* 8.0*  HCT 25.8* 24.1* 24.0* 24.7*  MCV 86.3 89.6 90.2 90.8  PLT 160 147* 134* 138*   Basic Metabolic Panel: Recent Labs  Lab 06/26/17 1427  06/27/17 0331  06/28/17 1545 06/29/17 0239 06/30/17 0248 07/01/17 0413 07/02/17 0349 07/03/17 0507  NA  --    < > 120*   < >  --  126* 126* 125* 126* 126*  K  --    < > 3.8   < > 4.1 3.8 4.1 4.4 4.6 4.3  CL  --    < > 72*   < >  --  85* 87* 86* 85* 84*  CO2  --    < > 36*   < >  --  33* 33* 35* 31 35*  GLUCOSE  --    < > 84   < >  --  111* 101* 103* 97 89  BUN  --    < > 5*   < >  --  <5* <5* <5* 5* <5*  CREATININE  --    < > 0.81   < >  --  0.58 0.62 0.72 0.74 0.69  CALCIUM  --    < > 8.0*   < >  --  8.2* 8.2* 8.4* 8.3* 8.2*  MG  --   --  1.5*  --  1.4* 1.9 1.8  --   --   --   PHOS 2.5  --  2.7  --  2.3* 4.3  --   --   --   --    < > = values in this interval not displayed.   GFR: Estimated Creatinine Clearance: 60.5 mL/min (by C-G  formula based on SCr of 0.69 mg/dL).  Liver Function Tests: Recent Labs  Lab 06/27/17 1540  PROT 5.1*   Coagulation Profile: Recent Labs  Lab 07/03/17 0507  INR 1.03    CBG: Recent Labs  Lab 06/29/17 0832 06/29/17 1207 06/30/17 0854 06/30/17 1215 06/30/17 2150  GLUCAP 115* 116* 122* 147* 139*    Recent Results (from the past 240 hour(s))  Culture, blood (routine x 2)     Status: None   Collection Time: 06/26/17 11:15 AM  Result Value Ref Range Status   Specimen Description BLOOD RIGHT ANTECUBITAL  Final   Special Requests   Final    BOTTLES DRAWN AEROBIC AND ANAEROBIC Blood Culture results may not be optimal due to an excessive volume of blood received in culture bottles   Culture NO GROWTH 5 DAYS  Final   Report Status 07/01/2017 FINAL  Final  Culture, blood (routine x 2)     Status: None   Collection Time: 06/26/17 11:25 AM  Result Value Ref Range Status   Specimen Description BLOOD RIGHT HAND  Final   Special Requests   Final    BOTTLES DRAWN AEROBIC AND ANAEROBIC Blood Culture adequate volume   Culture NO GROWTH 5 DAYS  Final   Report Status 07/01/2017 FINAL  Final  MRSA PCR Screening     Status: None   Collection Time: 06/26/17  3:04 PM  Result Value Ref Range Status   MRSA by PCR NEGATIVE NEGATIVE Final    Comment:        The GeneXpert MRSA Assay (FDA approved for NASAL specimens only), is one component of a comprehensive MRSA colonization surveillance program. It is not intended to diagnose MRSA infection nor to guide or monitor treatment for MRSA infections.   Body fluid culture     Status: None   Collection Time: 06/27/17  3:36 PM  Result Value Ref Range Status   Specimen Description FLUID RIGHT PLEURAL  Final   Special Requests NONE  Final   Gram Stain   Final    MODERATE WBC PRESENT, PREDOMINANTLY MONONUCLEAR NO ORGANISMS SEEN    Culture NO GROWTH 3 DAYS  Final  Report Status 07/01/2017 FINAL  Final     Scheduled Meds: . amiodarone   400 mg Oral Daily  . aspirin  81 mg Oral Daily  . atorvastatin  80 mg Oral q1800  . ferrous gluconate  324 mg Oral BID WC  . fluticasone furoate-vilanterol  1 puff Inhalation Daily  . furosemide  40 mg Oral Daily  . heparin subcutaneous  5,000 Units Subcutaneous Q8H  . metoprolol tartrate  12.5 mg Oral BID  . montelukast  10 mg Oral QHS  . nicotine  14 mg Transdermal Daily  . tiotropium  18 mcg Inhalation Daily     LOS: 7 days   Lonia BloodJeffrey T. McClung, MD [facility] Office  [phone] Pager - Text Page per Amion as per below:  On-Call/Text Page:      [email or web address]      password [email or web address]  If 7PM-7AM, please contact night-coverage [email or web address] Password [email or web address] 07/03/2017, 11:37 AM

## 2017-07-03 NOTE — Progress Notes (Signed)
Name: Samantha Dyer MRN: [id number] DOB: 10-Jun-1951    ADMISSION DATE:  06/26/2017 CONSULTATION DATE: 11/6  REFERRING MD : Clarice PolePfeifer  CHIEF COMPLAINT: Hyponatremia  BRIEF PATIENT DESCRIPTION:  This is a 66 year old white female with a known history of chronic obstructive pulmonary disease and recent coronary artery bypass grafting surgery on 10/17.  Presented to the emergency room on 10/6 with chief complaint of weakness and lethargy.  Diagnostic evaluation in the emergency room yielded a serum sodium of 116, and a potassium of 2.4 with a bicarbonate of 49.  Apparently her postop course was remarkable for aggressive diuresis in the setting of depressed ejection fraction of 40-45%.  Her serum bicarbonate was noted to be 49 on presentation she was admitted to the intensive care due to her severe metabolic disarray.  SIGNIFICANT EVENTS  On 2 liters 100% sat  STUDIES:  BNP: 484 Cortisol 23.2 Serum osmole 242  11/7 right thoracentesis 500 mL; serum protein 5.1 serum LDH 175 Pleural fluid culture>>> Cytology>>. Pleural glucose 109, pleural cell count red, turbid, white blood cells unable to perform.  CT chest obtained 11/7: Moderate right pleural effusion at right lung base.  Incomplete reexpansion of the right middle and lower lobes.  No mediastinal shift.  Small bilateral pleural effusions.  Cardiomegaly.  07/03/2017 CXR>> Persistent right-sided hydropneumothorax and small left pleural effusion. Bibasilar atelectasis or pneumonia. There has not been significant change since yesterday's study.Cardiomegaly with central pulmonary vascular congestion, stable.  SUBJECTIVE:  On 2 liters 100% sat About to walk the icu  VITAL SIGNS: Temp:  [97.6 F (36.4 C)-97.8 F (36.6 C)] 97.8 F (36.6 C) (11/13 0800) Pulse Rate:  [68-90] 86 (11/13 1012) Resp:  [10-26] 17 (11/13 0800) BP: (132-160)/(53-104) 152/59 (11/13 1012) SpO2:  [85 %-100 %] 99 % (11/13 0800) Weight:  [126 lb 5.2 oz  (57.3 kg)] 126 lb 5.2 oz (57.3 kg) (11/13 0600)  Intake/Output Summary (Last 24 hours) at 07/03/2017 1228 Last data filed at 07/03/2017 0606 Gross per 24 hour  Intake 1080 ml  Output 900 ml  Net 180 ml   2 L nasal cannula PHYSICAL EXAMINATION: General:Awake and alert in Nasal oxygen, getting ready to walk with PT Neuro: awake, alert, o x 3, MAE x 4, appropriate HEENT: jvd wnl, normocephalic, atraumatic PULM: reduced and coarse rt lung filed, reduced left, few crackles CV:  s1 s12 RRR , no RMG GI: soft, bs wnl, no rebound, obese Extremities:  1+ BLE edema, discoloration of chronic venous stasis   Recent Labs  Lab 07/01/17 0413 07/02/17 0349 07/03/17 0507  NA 125* 126* 126*  K 4.4 4.6 4.3  CL 86* 85* 84*  CO2 35* 31 35*  BUN <5* 5* <5*  CREATININE 0.72 0.74 0.69  GLUCOSE 103* 97 89   Recent Labs  Lab 06/29/17 0239 06/30/17 0248 07/01/17 0413  HGB 8.3* 7.9* 8.0*  HCT 24.1* 24.0* 24.7*  WBC 5.4 4.6 4.0  PLT 147* 134* 138*   Dg Chest Port 1 View  Result Date: 07/03/2017 CLINICAL DATA:  Shortness of breath. Known right-sided hydropneumothorax. EXAM: PORTABLE CHEST 1 VIEW COMPARISON:  Chest x-ray of July 02, 2017. FINDINGS: The remains a right-sided hydropneumothorax. The pleural line remains between the fourth and fifth posterior right rib interfaces. On the left there is a small pleural effusion but no pneumothorax. There is no mediastinal shift. Bibasilar densities persist consistent with atelectasis or pneumonia. The heart border is largely obscured. There is prominence of the central pulmonary vascularity. There  are post CABG changes. There is calcification in the wall of the aortic arch. IMPRESSION: Persistent right-sided hydropneumothorax and small left pleural effusion. Bibasilar atelectasis or pneumonia. There has not been significant change since yesterday's study. Cardiomegaly with central pulmonary vascular congestion, stable. Previous CABG. Thoracic aortic  atherosclerosis. Electronically Signed   By: David  SwazilandJordan M.D.   On: 07/03/2017 07:58   Dg Chest Port 1 View  Result Date: 07/02/2017 CLINICAL DATA:  66 year old female with pneumothorax. Prior thoracentesis. Subsequent encounter. EXAM: PORTABLE CHEST 1 VIEW COMPARISON:  06/30/2017 chest x-ray. FINDINGS: Moderately large right-sided hydropneumothorax. This may represent pneumothorax ex vacuo with minimal decrease in size of pneumothorax component although increase in size of pleural effusion component. Consolidation right mid lower lobe may represent atelectasis or infiltrate. Underlying mass not excluded. Mild pulmonary vascular prominence. Small left-sided pleural effusion/ left base atelectasis. Post CABG.  Cardiomegaly. Calcified aorta. IMPRESSION: Moderately large right-sided hydropneumothorax. This may represent pneumothorax ex vacuo with minimal decrease in size of pneumothorax component although increase in size of pleural effusion component. Consolidation right mid lower lobe may represent atelectasis or infiltrate. Mild pulmonary vascular prominence. Small left-sided pleural effusion/ left base atelectasis. Cardiomegaly post CABG. Aortic Atherosclerosis (ICD10-I70.0). Electronically Signed   By: Lacy DuverneySteven  Olson M.D.   On: 07/02/2017 07:36   Portable chest x-ray reviewed: No significant change in right-sided pneumothorax, now with small amount of right-sided pleural fluid>> no significant change last 24 hours  ASSESSMENT / PLAN:  Right exudative pleural effusion suspect related to postthoracotomy Right Ex vacuo Pneumothorax Hypoosmolar hyponatremia History of paroxysmal atrial fibrillation History of chronic obstructive pulmonary disease.  History of coronary artery disease, recent CABG, with chronic systolic heart  Anemia  Plan Very little change per imaging last 24 hours May need to consider  chest tube drainage to facilitate  aeration of lung  Maintain neg balance lasix>> increased  dose to 80 BID ( creatinine 0.69) Chem in am  Coags 11/13 am PT is 13.4, INR is 1.03 and APTT is 38  CXR 11/14 am Dr. Tyson AliasFeinstein made patient  aware that CT  still MAY NOT imrprove trap lung Ambulate and mobilize  Mcarthur RossettiDaniel J. Tyson AliasFeinstein, MD, FACP Pgr: [phone] [facility] 07/03/2017 12:28 PM  STAFF NOTE: Cindi CarbonI, Janelie Goltz, MD FACP have personally reviewed patient's available data, including medical history, events of note, physical examination and test results as part of my evaluation. I have discussed with resident/NP and other care providers such as pharmacist, RN and RRT. In addition, I personally evaluated patient and elicited key findings of: awake, less SOB, at rest calm, much improve compared to day prior, jvd wnl or high, less ronchi and less reduced BS left base, abdo soft, mild edema, pcxr which I reviewed showed increase effusion left with remaining pTX , it is odd that this would be a trap lung weeks after CABg without known adhesions prior or other pathology, she appears improved status today, will repeat pcxr in am and follow clinical attus but I am leaning toward placement of pigtail to see if lung will re expand as acute and not known to have trap lung from chronic effusion, lasix to increase ot further neg balance, hyponatremia noted, may need urine osm, na, but has been on lasix, follow na trend with lasix, coags wnl for tube if needed, cbc to follow, I update dpt  Mcarthur Rossettianiel J. Tyson AliasFeinstein, MD, FACP Pgr: [phone] [facility] 07/03/2017 2:56 PM

## 2017-07-03 NOTE — Care Management Important Message (Signed)
Important Message  Patient Details  Name: Samantha Dyer MRN: [id number] Date of Birth: [birth year]   Medicare Important Message Given:  Yes    Kyla BalzarineShealy, Cavon Nicolls Abena 07/03/2017, 10:44 AM

## 2017-07-03 NOTE — Progress Notes (Signed)
Occupational Therapy Treatment Patient Details Name: Lum KeasKathy D Kabler MRN: [id number] DOB: 11-Jul-1951 Today's Date: 07/03/2017    History of present illness 66 y.o. female with a history of severe COPD just recently on 2-2.5L home O2, and CAD status post CABG on 06/06/17 who presented to the ED w/ weakness and lethargy. Workup revealed hyponatremia, hypokalemia, and metabolic alkalosis. Notably the patient had been undergoing aggressive diuresis postoperatively   OT comments  Pt demonstrates improving activity tolerance and ability to perform ADLs.  Currently, she requires supervision - min guard assist for ADLs and functional mobility.   02 sats remained >93% with activity.   Will continue to follow.   Follow Up Recommendations  Home health OT;Supervision/Assistance - 24 hour    Equipment Recommendations  None recommended by OT    Recommendations for Other Services      Precautions / Restrictions Precautions Precautions: Fall;Sternal Precaution Comments: Pt s/p CABG 10/18; watch O2 saturations       Mobility Bed Mobility               General bed mobility comments: Pt sitting in recliner   Transfers Overall transfer level: Needs assistance Equipment used: Rolling walker (2 wheeled) Transfers: Sit to/from UGI CorporationStand;Stand Pivot Transfers Sit to Stand: Supervision Stand pivot transfers: Min guard       General transfer comment: assist for safety     Balance Overall balance assessment: Needs assistance Sitting-balance support: No upper extremity supported Sitting balance-[name]: Good     Standing balance support: Single extremity supported;During functional activity Standing balance-[name]: Fair Standing balance comment: able to static stand but prefers use of AD for comfort                           ADL either performed or assessed with clinical judgement   ADL Overall ADL's : Needs assistance/impaired                     Lower  Body Dressing: Supervision/safety;Sit to/from stand   Toilet Transfer: Supervision/safety;Ambulation;Comfort height toilet;Grab bars   Toileting- Clothing Manipulation and Hygiene: Supervision/safety;Sit to/from stand       Functional mobility during ADLs: Supervision/safety;Min guard       Vision       Perception     Praxis      Cognition Arousal/Alertness: Awake/alert Behavior During Therapy: Flat affect Overall Cognitive Status: Within Functional Limits for tasks assessed                                          Exercises     Shoulder Instructions       General Comments 02 sats remained >93% during activity with pt on 2L supplemental 02    Pertinent Vitals/ Pain       Pain Assessment: No/denies pain  Home Living                                          Prior Functioning/Environment              Frequency  Min 2X/week        Progress Toward Goals  OT Goals(current goals can now be found in the care plan section)  Progress towards OT goals: Progressing  toward goals  Acute Rehab OT Goals Patient Stated Goal: to go home  Plan Discharge plan remains appropriate    Co-evaluation                 AM-PAC PT "6 Clicks" Daily Activity     Outcome Measure   Help from another person eating meals?: None Help from another person taking care of personal grooming?: A Little Help from another person toileting, which includes using toliet, bedpan, or urinal?: A Little Help from another person bathing (including washing, rinsing, drying)?: A Little Help from another person to put on and taking off regular upper body clothing?: A Little Help from another person to put on and taking off regular lower body clothing?: A Little 6 Click Score: 19    End of Session Equipment Utilized During Treatment: Rolling walker  OT Visit Diagnosis: Other abnormalities of gait and mobility (R26.89);Muscle weakness (generalized)  (M62.81)   Activity Tolerance Patient tolerated treatment well   Patient Left in chair;with call bell/phone within reach   Nurse Communication Mobility status        Time: [id number] OT Time Calculation (min): 19 min  Charges: OT General Charges $OT Visit: 1 Visit OT Treatments $Therapeutic Activity: 8-22 mins  .   Jeani HawkingConarpe, Cristiano Capri M 07/03/2017, 4:59 PM

## 2017-07-03 NOTE — Progress Notes (Signed)
Physical Therapy Treatment Patient Details Name: Samantha Dyer MRN: [id number] DOB: 03-14-1951 Today's Date: 07/03/2017    History of Present Illness 66 y.o. female with a history of severe COPD just recently on 2-2.5L home O2, and CAD status post CABG on 06/06/17 who presented to the ED w/ weakness and lethargy. Workup revealed hyponatremia, hypokalemia, and metabolic alkalosis. Notably the patient had been undergoing aggressive diuresis postoperatively    PT Comments    Continuing work on functional mobility and activity tolerance;  Walked the unit twice on 2 liters supplemental O2; O2 sats ranged 92-100%; progressing well   Follow Up Recommendations  Home health PT;Supervision for mobility/OOB     Equipment Recommendations  Other (comment)(Will consider Rollator)    Recommendations for Other Services       Precautions / Restrictions Precautions Precautions: Fall Precaution Comments: watch O2 saturations    Mobility  Bed Mobility               General bed mobility comments: received sitting EOB  Transfers Overall transfer level: Needs assistance Equipment used: Rolling walker (2 wheeled) Transfers: Sit to/from Stand Sit to Stand: Min guard         General transfer comment: min guard for safety and stability  Ambulation/Gait Ambulation/Gait assistance: Min guard Ambulation Distance (Feet): 300 Feet Assistive device: Rolling walker (2 wheeled) Gait Pattern/deviations: Step-through pattern;Decreased stride length;Drifts right/left     General Gait Details: steady with use of RW for UE support, cued for positioning within RW; one small loss of balance with turning   Stairs            Wheelchair Mobility    Modified Rankin (Stroke Patients Only)       Balance     Sitting balance-[name]: Fair       Standing balance-[name]: Fair Standing balance comment: able to static stand but prefers use of AD for comfort                             Cognition Arousal/Alertness: Awake/alert Behavior During Therapy: Flat affect Overall Cognitive Status: Within Functional Limits for tasks assessed                                        Exercises      General Comments General comments (skin integrity, edema, etc.): O2 sats ranged 92-100% walking on 2 L supplemental O2      Pertinent Vitals/Pain Pain Assessment: No/denies pain    Home Living                      Prior Function            PT Goals (current goals can now be found in the care plan section) Acute Rehab PT Goals Patient Stated Goal: to go home PT Goal Formulation: With patient/family Time For Goal Achievement: 07/15/17 Potential to Achieve Goals: Good Progress towards PT goals: Progressing toward goals    Frequency    Min 3X/week      PT Plan Current plan remains appropriate    Co-evaluation              AM-PAC PT "6 Clicks" Daily Activity  Outcome Measure  Difficulty turning over in bed (including adjusting bedclothes, sheets and blankets)?: A Little Difficulty moving from lying on back to sitting  on the side of the bed? : A Little Difficulty sitting down on and standing up from a chair with arms (e.g., wheelchair, bedside commode, etc,.)?: A Little Help needed moving to and from a bed to chair (including a wheelchair)?: A Little Help needed walking in hospital room?: A Little Help needed climbing 3-5 steps with a railing? : A Little 6 Click Score: 18    End of Session Equipment Utilized During Treatment: Gait belt;Oxygen(2 liters) Activity Tolerance: Patient tolerated treatment well Patient left: in chair;with call bell/phone within reach Nurse Communication: Mobility status PT Visit Diagnosis: Muscle weakness (generalized) (M62.81);Difficulty in walking, not elsewhere classified (R26.2)     Time: [id number] PT Time Calculation (min) (ACUTE ONLY): 31 min  Charges:  $Gait Training: 23-37  mins                    G Codes:       Van ClinesHolly Frenchie [place], [place]PT  Acute Rehabilitation Services Pager [phone] Office [phone]    Levi AlandHolly H Othniel Maret 07/03/2017, 2:56 PM

## 2017-07-03 NOTE — Progress Notes (Signed)
Pt refused getting weight at this time. Will pass information on to next shift so that pt can get standing weight upon transfer to chair.

## 2017-07-03 NOTE — Progress Notes (Signed)
Pt has not had a bowel movement in 5 days. Requested something for constipation.  Pt prefers to take the Senokot and not the Miralax.

## 2017-07-04 ENCOUNTER — Inpatient Hospital Stay (HOSPITAL_COMMUNITY): Payer: Medicare Other

## 2017-07-04 DIAGNOSIS — J90 Pleural effusion, not elsewhere classified: Secondary | ICD-10-CM

## 2017-07-04 DIAGNOSIS — R0902 Hypoxemia: Secondary | ICD-10-CM

## 2017-07-04 DIAGNOSIS — J948 Other specified pleural conditions: Secondary | ICD-10-CM

## 2017-07-04 DIAGNOSIS — J9601 Acute respiratory failure with hypoxia: Secondary | ICD-10-CM

## 2017-07-04 LAB — CBC
HCT: 25.6 % — ABNORMAL LOW (ref 36.0–46.0)
HEMOGLOBIN: 8.4 g/dL — AB (ref 12.0–15.0)
MCH: 30 pg (ref 26.0–34.0)
MCHC: 32.8 g/dL (ref 30.0–36.0)
MCV: 91.4 fL (ref 78.0–100.0)
Platelets: 159 10*3/uL (ref 150–400)
RBC: 2.8 MIL/uL — AB (ref 3.87–5.11)
RDW: 15 % (ref 11.5–15.5)
WBC: 4.4 10*3/uL (ref 4.0–10.5)

## 2017-07-04 LAB — BASIC METABOLIC PANEL
ANION GAP: 9 (ref 5–15)
BUN: <5 mg/dL — ABNORMAL LOW (ref 6–20)
CHLORIDE: 78 mmol/L — AB (ref 101–111)
CO2: 41 mmol/L — ABNORMAL HIGH (ref 22–32)
CREATININE: 0.71 mg/dL (ref 0.44–1.00)
Calcium: 8.5 mg/dL — ABNORMAL LOW (ref 8.9–10.3)
GFR calc non Af Amer: >60 mL/min (ref >60)
Glucose, Bld: 108 mg/dL — ABNORMAL HIGH (ref 65–99)
POTASSIUM: 3.9 mmol/L (ref 3.5–5.1)
SODIUM: 128 mmol/L — AB (ref 135–145)

## 2017-07-04 MED ORDER — FUROSEMIDE 80 MG PO TABS
80.0000 mg | ORAL_TABLET | Freq: Two times a day (BID) | ORAL | Status: DC
  Administered 2017-07-05: 80 mg via ORAL
  Filled 2017-07-04: qty 1

## 2017-07-04 NOTE — Progress Notes (Signed)
Physical Therapy Treatment Patient Details Name: Lum KeasKathy D Faughnan MRN: [id number] DOB: [birth year] Today's Date: 07/04/2017    History of Present Illness 66 y.o. female with a history of severe COPD just recently on 2-2.5L home O2, and CAD status post CABG on 06/06/17 who presented to the ED w/ weakness and lethargy. Workup revealed hyponatremia, hypokalemia, and metabolic alkalosis. Notably the patient had been undergoing aggressive diuresis postoperatively    PT Comments    Continuing work on functional mobility and activity tolerance;  Limited distance today as pt had been up all morning and was tired; feeling weak, but agreeable to walking; One small loss of balance with velocity change; husband present and gave correct/appropriate assist;   Pt would like to try rollator RW next session  Follow Up Recommendations  Home health PT;Supervision for mobility/OOB     Equipment Recommendations  Other (comment)(Will consider Rollator)    Recommendations for Other Services       Precautions / Restrictions Precautions Precautions: Fall;Sternal Precaution Comments: Pt s/p CABG 10/18; watch O2 saturations Restrictions Weight Bearing Restrictions: No    Mobility  Bed Mobility                  Transfers Overall transfer level: Needs assistance Equipment used: Rolling walker (2 wheeled) Transfers: Sit to/from Stand Sit to Stand: Supervision         General transfer comment: Cues for hand placement  Ambulation/Gait Ambulation/Gait assistance: Min guard Ambulation Distance (Feet): 150 Feet Assistive device: Rolling walker (2 wheeled) Gait Pattern/deviations: Step-through pattern;Decreased stride length Gait velocity: decreased   General Gait Details: steady with use of RW for UE support, cued for positioning within RW; one small loss of balance with turning; husband present during walk and provided appropriate assist   Stairs            Wheelchair Mobility     Modified Rankin (Stroke Patients Only)       Balance     Sitting balance-[name]: Good       Standing balance-[name]: Fair                              Cognition Arousal/Alertness: Awake/alert Behavior During Therapy: Flat affect Overall Cognitive Status: Within Functional Limits for tasks assessed                                        Exercises      General Comments General comments (skin integrity, edema, etc.): 02 sats remained >93% during activity with pt on 2L supplemental 02      Pertinent Vitals/Pain Pain Assessment: No/denies pain    Home Living                      Prior Function            PT Goals (current goals can now be found in the care plan section) Acute Rehab PT Goals Patient Stated Goal: to go home PT Goal Formulation: With patient/family Time For Goal Achievement: 07/15/17 Potential to Achieve Goals: Good Progress towards PT goals: Progressing toward goals    Frequency    Min 3X/week      PT Plan Current plan remains appropriate    Co-evaluation              AM-PAC PT "6 Clicks"  Daily Activity  Outcome Measure  Difficulty turning over in bed (including adjusting bedclothes, sheets and blankets)?: A Little Difficulty moving from lying on back to sitting on the side of the bed? : A Little Difficulty sitting down on and standing up from a chair with arms (e.g., wheelchair, bedside commode, etc,.)?: A Little Help needed moving to and from a bed to chair (including a wheelchair)?: A Little Help needed walking in hospital room?: A Little Help needed climbing 3-5 steps with a railing? : A Little 6 Click Score: 18    End of Session Equipment Utilized During Treatment: Gait belt;Oxygen Activity Tolerance: Patient tolerated treatment well Patient left: in chair;with call bell/phone within reach;with family/visitor present Nurse Communication: Mobility status PT Visit Diagnosis:  Muscle weakness (generalized) (M62.81);Difficulty in walking, not elsewhere classified (R26.2)     Time: [id number] PT Time Calculation (min) (ACUTE ONLY): 20 min  Charges:  $Gait Training: 8-22 mins                    G Codes:       Van ClinesHolly Ripley Lovecchio, PT  Acute Rehabilitation Services Pager [phone] Office [phone]    Levi AlandHolly H Denea Cheaney 07/04/2017, 12:45 PM

## 2017-07-04 NOTE — Progress Notes (Signed)
[facility] TEAM 1 - Stepdown/ICU TEAM  Brittinee Risk Steele  ZOX:[id number] DOB: 1951/04/19 DOA: 06/26/2017 PCP: Marinda Elk, MD    Brief Narrative:  66 y.o. female with a history of severe COPD just recently on 2-2.5L home O2, and CAD status post CABG on 06/06/17 who presented to the ED w/ weakness and lethargy. Workup revealed hyponatremia, hypokalemia, and metabolic alkalosis. Notably the patient had been undergoing aggressive diuresis postoperatively.  Significant Events: 11/06 - Admit 11/07 - Thoracentesis w/ post-procedure pneumothorax likely due to trapped lung 11/08 - [facility] assumed care   Subjective: The patient is doing well today.  She denies shortness of breath chest pain nausea or vomiting.  She is anxious to go home.  Assessment & Plan:  R PTX PCCM following - unchanged in f/u - PCCM has now decided against a chest tube - if remains stable over night will plan for d/c home 11/15  Bilateral pleural effusions secondary to previous thoracic surgery - clinically insignificant at this time    Hyponatremia initially improved with gentle volume expansion - stalled with DC of IV fluid - cont to follow w/ ongoing diuresis - improving at this time   Hypokalemia Due to diuretic - corrected   Hypomagnesemia Due to diuretic - corrected   Hypophosphatemia Corrected   Acute on chronic Hypoxic respiratory failure - COPD Stable at present - presently on her home dose of 2.5L w/ good sats   Chronic diastolic CHF Peripheral edema w/o signif change at this time   [facility] Weights   07/01/17 0328 07/02/17 0343 07/03/17 0600  Weight: 57.6 kg (126 lb 15.8 oz) 57.3 kg (126 lb 5.2 oz) 57.3 kg (126 lb 5.2 oz)    CAD status post CABG October 2018 No chest pain presently    Paroxysmal atrial fibrillation NSR th/o this hospital stay - appears to have been a transient post-operative issue at time of CABG - was not placed on anticoag previously   Iron deficiency anemia no evidence of blood  loss at this time - cont to follow trend   DVT prophylaxis: SQ heparin  Code Status: FULL CODE Family Communication: no family present at time of exam   Disposition Plan: plan for d/c home 11/15 if stable over night   Consultants:  PCCM  Antimicrobials:  None  Objective: Blood pressure (!) 146/60, pulse 74, temperature 97.8 F (36.6 C), temperature source Oral, resp. rate 18, height 5\' 4"  (1.626 m), weight 57.3 kg (126 lb 5.2 oz), SpO2 100 %.  Intake/Output Summary (Last 24 hours) at 07/04/2017 1646 Last data filed at 07/04/2017 1017 Gross per 24 hour  Intake -  Output 1700 ml  Net -1700 ml   Filed Weights   07/01/17 0328 07/02/17 0343 07/03/17 0600  Weight: 57.6 kg (126 lb 15.8 oz) 57.3 kg (126 lb 5.2 oz) 57.3 kg (126 lb 5.2 oz)    Examination: General: No acute respiratory distress  Lungs: poor air movement R apex - CTA th/o otherwise  Cardiovascular: RRR w/o M  Abdomen: NT/ND, soft, bs+, no mass Extremities: 1+ pitting B LE edema R > L   CBC: Recent Labs  Lab 06/29/17 0239 06/30/17 0248 07/01/17 0413 07/04/17 0645  WBC 5.4 4.6 4.0 4.4  HGB 8.3* 7.9* 8.0* 8.4*  HCT 24.1* 24.0* 24.7* 25.6*  MCV 89.6 90.2 90.8 91.4  PLT 147* 134* 138* 159   Basic Metabolic Panel: Recent Labs  Lab 06/28/17 1545 06/29/17 0239 06/30/17 0248 07/01/17 0413 07/02/17 0349 07/03/17 0507 07/04/17 0645  NA  --  126* 126* 125* 126* 126* 128*  K 4.1 3.8 4.1 4.4 4.6 4.3 3.9  CL  --  85* 87* 86* 85* 84* 78*  CO2  --  33* 33* 35* 31 35* 41*  GLUCOSE  --  111* 101* 103* 97 89 108*  BUN  --  <5* <5* <5* 5* <5* <5*  CREATININE  --  0.58 0.62 0.72 0.74 0.69 0.71  CALCIUM  --  8.2* 8.2* 8.4* 8.3* 8.2* 8.5*  MG 1.4* 1.9 1.8  --   --   --   --   PHOS 2.3* 4.3  --   --   --   --   --    GFR: Estimated Creatinine Clearance: 60.5 mL/min (by C-G formula based on SCr of 0.71 mg/dL).  Coagulation Profile: Recent Labs  Lab 07/03/17 0507  INR 1.03    CBG: Recent Labs  Lab  06/29/17 0832 06/29/17 1207 06/30/17 0854 06/30/17 1215 06/30/17 2150  GLUCAP 115* 116* 122* 147* 139*    Recent Results (from the past 240 hour(s))  Culture, blood (routine x 2)     Status: None   Collection Time: 06/26/17 11:15 AM  Result Value Ref Range Status   Specimen Description BLOOD RIGHT ANTECUBITAL  Final   Special Requests   Final    BOTTLES DRAWN AEROBIC AND ANAEROBIC Blood Culture results may not be optimal due to an excessive volume of blood received in culture bottles   Culture NO GROWTH 5 DAYS  Final   Report Status 07/01/2017 FINAL  Final  Culture, blood (routine x 2)     Status: None   Collection Time: 06/26/17 11:25 AM  Result Value Ref Range Status   Specimen Description BLOOD RIGHT HAND  Final   Special Requests   Final    BOTTLES DRAWN AEROBIC AND ANAEROBIC Blood Culture adequate volume   Culture NO GROWTH 5 DAYS  Final   Report Status 07/01/2017 FINAL  Final  MRSA PCR Screening     Status: None   Collection Time: 06/26/17  3:04 PM  Result Value Ref Range Status   MRSA by PCR NEGATIVE NEGATIVE Final    Comment:        The GeneXpert MRSA Assay (FDA approved for NASAL specimens only), is one component of a comprehensive MRSA colonization surveillance program. It is not intended to diagnose MRSA infection nor to guide or monitor treatment for MRSA infections.   Body fluid culture     Status: None   Collection Time: 06/27/17  3:36 PM  Result Value Ref Range Status   Specimen Description FLUID RIGHT PLEURAL  Final   Special Requests NONE  Final   Gram Stain   Final    MODERATE WBC PRESENT, PREDOMINANTLY MONONUCLEAR NO ORGANISMS SEEN    Culture NO GROWTH 3 DAYS  Final   Report Status 07/01/2017 FINAL  Final     Scheduled Meds: . amiodarone  400 mg Oral Daily  . aspirin  81 mg Oral Daily  . atorvastatin  80 mg Oral q1800  . ferrous gluconate  324 mg Oral BID WC  . fluticasone furoate-vilanterol  1 puff Inhalation Daily  . furosemide  80 mg  Intravenous Q12H  . heparin subcutaneous  5,000 Units Subcutaneous Q8H  . metoprolol tartrate  12.5 mg Oral BID  . montelukast  10 mg Oral QHS  . nicotine  14 mg Transdermal Daily  . polyethylene glycol  17 g Oral Daily  . senna-docusate  1 tablet Oral BID  . tiotropium  18 mcg Inhalation Daily     LOS: 8 days   Lonia BloodJeffrey T. Jamiere Gulas, MD [facility] Office  [phone] Pager - Text Page per Amion as per below:  On-Call/Text Page:      [email or web address]      password [email or web address]  If 7PM-7AM, please contact night-coverage [email or web address] Password [email or web address] 07/04/2017, 4:46 PM

## 2017-07-04 NOTE — Progress Notes (Signed)
Report given to RN receiving patient on 555W. Patient transferred via wheelchair to [phone] with belongings. Belongings included bath robes, underwear, cell phone, and charger. CCMd notified of transfer- pt will be on telemetry. Family aware of transfer as they were notified prior to shift change.   Noe GensStefanie A Daphne Karrer, RN

## 2017-07-04 NOTE — Progress Notes (Signed)
      [address]       [place] [id number]             [phone]      I know Samantha Dyer well from her recent surgery  She is a 66 yo smoker with severe COPD on 2-2.5 L [place] O2 at home. She was readmitted with profound metabolic derangements. She had a moderate right pleural effusion. Thoracentesis was performed on 11/7 draining 500 ml of bloody fluid. She had a pneumo ex vacuo, which has persisted.  She is on 2L  in NAD Lungs diminished bilaterally slightly greater on right.  CXR shows a right hydropneumothorax unchanged from previous.  I discussed options of placing a pigtail to try to reexpand the lung v observation. I encouraged her to consider a tube but she does not want to do that at this time.  Agree with plan to dc home tomorrow  I will need to see her back in the office with a CXR next week  Viviann SpareSteven C. Dorris FetchHendrickson, MD [facility] [phone]

## 2017-07-04 NOTE — Progress Notes (Signed)
Name: Lum KeasKathy D Carda MRN: [id number] DOB: May 25, 1951    ADMISSION DATE:  06/26/2017 CONSULTATION DATE: 11/6  REFERRING MD : Clarice PolePfeifer  CHIEF COMPLAINT: Hyponatremia  BRIEF PATIENT DESCRIPTION:  This is a 66 year old white female with a known history of chronic obstructive pulmonary disease and recent coronary artery bypass grafting surgery on 10/17.  Presented to the emergency room on 10/6 with chief complaint of weakness and lethargy.  Diagnostic evaluation in the emergency room yielded a serum sodium of 116, and a potassium of 2.4 with a bicarbonate of 49.  Apparently her postop course was remarkable for aggressive diuresis in the setting of depressed ejection fraction of 40-45%.  Her serum bicarbonate was noted to be 49 on presentation she was admitted to the intensive care due to her severe metabolic disarray.  SIGNIFICANT EVENTS  No events overnight  STUDIES:  BNP: 484 Cortisol 23.2 Serum osmole 242  11/7 right thoracentesis 500 mL; serum protein 5.1 serum LDH 175 Pleural fluid culture>>> Cytology>>. Pleural glucose 109, pleural cell count red, turbid, white blood cells unable to perform.  CT chest obtained 11/7: Moderate right pleural effusion at right lung base.  Incomplete reexpansion of the right middle and lower lobes.  No mediastinal shift.  Small bilateral pleural effusions.  Cardiomegaly.  07/03/2017 CXR>> Persistent right-sided hydropneumothorax and small left pleural effusion. Bibasilar atelectasis or pneumonia. There has not been significant change since yesterday's study.Cardiomegaly with central pulmonary vascular congestion, stable.  SUBJECTIVE:  No events overnight, saturating well on 2L [place]  VITAL SIGNS: Temp:  [97.4 F (36.3 C)-98 F (36.7 C)] 98 F (36.7 C) (11/14 0513) Pulse Rate:  [63-74] 70 (11/14 0846) Resp:  [16-26] 18 (11/14 0513) BP: (125-173)/(38-81) 147/48 (11/14 0846) SpO2:  [96 %-100 %] 98 % (11/14 1002)  Intake/Output Summary (Last 24  hours) at 07/04/2017 1129 Last data filed at 07/04/2017 1017 Gross per 24 hour  Intake -  Output 2150 ml  Net -2150 ml   2 L nasal cannula PHYSICAL EXAMINATION: General: Well appearing, NAD Neuro: Awake and interactive, moving all ext to command HEENT: [place]/AT, PERRL, EOM-I and MMM PULM: Decreased BS on the right CV:  RRR, Nl S1/S2 and -M/R/G. GI: Soft, NT, ND and +BS Extremities:  1+ BLE edema, discoloration of chronic venous stasis  Recent Labs  Lab 07/02/17 0349 07/03/17 0507 07/04/17 0645  NA 126* 126* 128*  K 4.6 4.3 3.9  CL 85* 84* 78*  CO2 31 35* 41*  BUN 5* <5* <5*  CREATININE 0.74 0.69 0.71  GLUCOSE 97 89 108*   Recent Labs  Lab 06/30/17 0248 07/01/17 0413 07/04/17 0645  HGB 7.9* 8.0* 8.4*  HCT 24.0* 24.7* 25.6*  WBC 4.6 4.0 4.4  PLT 134* 138* 159   Dg Chest Port 1 View  Result Date: 07/04/2017 CLINICAL DATA:  Respiratory failure EXAM: PORTABLE CHEST 1 VIEW COMPARISON:  July 03, 2017 FINDINGS: Stable hydropneumothorax on the right without tension component. There is a small pleural effusion on the left as well. There is patchy consolidation in both lung bases, stable. There is no evident new opacity. Heart is upper normal in size with pulmonary vascularity within normal limits. There is aortic atherosclerosis. No bone lesions. There is carotid artery calcification on the left. IMPRESSION: Stable hydropneumothorax on the right without tension component. Small left pleural effusion. Bibasilar patchy consolidation. Stable cardiac silhouette. Aortic atherosclerosis as well as calcification in the left carotid artery noted. Aortic Atherosclerosis (ICD10-I70.0). Electronically Signed   By: Chrissie NoaWilliam  Margarita GrizzleWoodruff III M.D.   On: 07/04/2017 07:30   Dg Chest Port 1 View  Result Date: 07/03/2017 CLINICAL DATA:  Shortness of breath. Known right-sided hydropneumothorax. EXAM: PORTABLE CHEST 1 VIEW COMPARISON:  Chest x-ray of July 02, 2017. FINDINGS: The remains a  right-sided hydropneumothorax. The pleural line remains between the fourth and fifth posterior right rib interfaces. On the left there is a small pleural effusion but no pneumothorax. There is no mediastinal shift. Bibasilar densities persist consistent with atelectasis or pneumonia. The heart border is largely obscured. There is prominence of the central pulmonary vascularity. There are post CABG changes. There is calcification in the wall of the aortic arch. IMPRESSION: Persistent right-sided hydropneumothorax and small left pleural effusion. Bibasilar atelectasis or pneumonia. There has not been significant change since yesterday's study. Cardiomegaly with central pulmonary vascular congestion, stable. Previous CABG. Thoracic aortic atherosclerosis. Electronically Signed   By: David  SwazilandJordan M.D.   On: 07/03/2017 07:58   I reviewed CXR myself, no change in PTX  ASSESSMENT / PLAN:  Right exudative pleural effusion suspect related to postthoracotomy Right Ex vacuo Pneumothorax Hypoosmolar hyponatremia History of paroxysmal atrial fibrillation History of chronic obstructive pulmonary disease.  History of coronary artery disease, recent CABG, with chronic systolic heart  Anemia  Plan Very little change per imaging last 24 hours Will have CVTS - Dr. Dorris FetchHendrickson address trapped lung Maintain neg balance lasix>> increased dose to 80 BID ( creatinine 0.69) CXR to PRN at this point [place] 11/13 am PT is 13.4, INR is 1.03 and APTT is 38  Ambulate and mobilize  PCCM will sign off, please call back if needed.  Alyson ReedyWesam G. Leeandra Ellerson, M.D. [facility]. Pager: [phone]. After hours pager: [phone].

## 2017-07-04 NOTE — Progress Notes (Signed)
Patient arrived to the unit via wheelchair.  Patient is alert and oriented x 4.  No complaints of pain.  Vital signs:  Temp 97.8; BP: 137/52; Pulse: 71;  Resp 18; 100% on 2LPM . Skin assessment complete.  Generalized Ecchymosis.  Old surgical scar to the sternum.  Placed the patient on cardiac monitoring.  Educated the patient on how to reach the staff on the unit.  Activated the bed alarm and placed the call light within reach.  Will continue to monitor the patient.

## 2017-07-05 DIAGNOSIS — Z9889 Other specified postprocedural states: Secondary | ICD-10-CM

## 2017-07-05 MED ORDER — FUROSEMIDE 80 MG PO TABS: 40.0000 mg | ORAL_TABLET | Freq: Two times a day (BID) | ORAL | 0 refills | Status: DC

## 2017-07-05 MED ORDER — COMBIVENT RESPIMAT 20-100 MCG/ACT IN AERS: 1.0000 | INHALATION_SPRAY | Freq: Four times a day (QID) | RESPIRATORY_TRACT | 0 refills | Status: AC | PRN

## 2017-07-05 MED ORDER — POTASSIUM CHLORIDE CRYS ER 20 MEQ PO TBCR: 20.0000 meq | EXTENDED_RELEASE_TABLET | Freq: Every day | ORAL | 0 refills | Status: DC

## 2017-07-05 NOTE — Progress Notes (Signed)
Samantha DriversKathy D Dyer to be D/C'd Home per MD order.  Discussed with the patient and all questions fully answered.  VSS, Skin clean, dry and intact without evidence of skin break down, no evidence of skin tears noted. IV catheter discontinued intact. Site without signs and symptoms of complications. Dressing and pressure applied.  An After Visit Summary was printed and given to the patient. Patient received prescription.  Allergies as of 07/05/2017      Reactions   Amoxicillin-pot Clavulanate Swelling   Augmentin [amoxicillin-pot Clavulanate] Swelling      Medication List    STOP taking these medications   guaiFENesin 600 MG 12 hr tablet Commonly known as:  MUCINEX   predniSONE 10 MG tablet Commonly known as:  DELTASONE     TAKE these medications   acetaminophen 325 MG tablet Commonly known as:  TYLENOL Take 650 mg by mouth every 6 (six) hours as needed for mild pain.   amiodarone 400 MG tablet Commonly known as:  PACERONE Take 1 tablet (400 mg total) daily by mouth.   aspirin 81 MG EC tablet Take 1 tablet (81 mg total) by mouth daily.   atorvastatin 80 MG tablet Commonly known as:  LIPITOR Take 1 tablet (80 mg total) by mouth daily at 6 PM.   COMBIVENT RESPIMAT 20-100 MCG/ACT Aers respimat Generic drug:  Ipratropium-Albuterol Inhale 1 puff 4 (four) times daily as needed into the lungs for wheezing or shortness of breath. What changed:    when to take this  reasons to take this   ferrous gluconate 324 MG tablet Commonly known as:  FERGON Take 1 tablet (324 mg total) by mouth 2 (two) times daily with a meal.   fluticasone furoate-vilanterol 200-25 MCG/INH Aepb Commonly known as:  BREO ELLIPTA Inhale 1 puff into the lungs daily. What changed:  Another medication with the same name was removed. Continue taking this medication, and follow the directions you see here.   furosemide 80 MG tablet Commonly known as:  LASIX Take 0.5 tablets (40 mg total) 2 (two) times daily  by mouth. What changed:    medication strength  additional instructions   metolazone 5 MG tablet Commonly known as:  ZAROXOLYN Take 5 mg daily by mouth.   metoprolol tartrate 25 MG tablet Commonly known as:  LOPRESSOR Take 1 tablet (25 mg total) by mouth 2 (two) times daily.   OXYGEN Inhale 2 L into the lungs continuous.   potassium chloride SA 20 MEQ tablet Commonly known as:  K-DUR,KLOR-CON Take 1 tablet (20 mEq total) daily by mouth.   SPIRIVA HANDIHALER 18 MCG inhalation capsule Generic drug:  tiotropium inhale contents of one capsule once daily What changed:  Another medication with the same name was removed. Continue taking this medication, and follow the directions you see here.   traMADol 50 MG tablet Commonly known as:  ULTRAM Take 1 tablet (50 mg total) by mouth every 4 (four) hours as needed for moderate pain.            Durable Medical Equipment  (From admission, onward)        Start     Ordered   07/05/17 1138  For home use only DME 4 wheeled rolling walker with seat  Once    Question:  Patient needs a walker to treat with the following condition  Answer:  Weakness   07/05/17 1138      D/c education completed with patient/family including follow up instructions, medication list, d/c activities limitations  if indicated, with other d/c instructions as indicated by MD - patient able to verbalize understanding, all questions fully answered.   Patient instructed to return to ED, call 911, or call MD for any changes in condition.   Patient escorted via WC, and D/C home via private auto.  Samantha DesanctisSTANISHA  Samantha Dyer 07/05/2017 2:50 PM

## 2017-07-05 NOTE — Care Management Note (Addendum)
Case Management Note  Patient Details  Name: Samantha Dyer MRN: [id number] Date of Birth: 1950-09-18  Subjective/Objective:          Admitted with hyponatremia.          Action/Plan: Plan is to d/c to home today.  Expected Discharge Date:  07/05/17               Expected Discharge Plan:  Home with home health services  In-House Referral:     Discharge planning Services  CM Consult  Post Acute Care Choice:  Resumption of Svcs/PTA Provider, Durable Medical Equipment Choice offered to:  Patient  DME Arranged:  Oxygen. rolator DME Agency:  [facility]rolator will be delivered to room prior to d/c.  HH Arranged:  PT, OT HH Agency:  [facility]  Status of Service:  Completed, signed off  If discussed at Long Length of Stay Meetings, dates discussed:    Additional Comments:  Epifanio LeschesCole, Corbett Moulder Hudson, RN 07/05/2017, 11:42 AM

## 2017-07-05 NOTE — Discharge Instructions (Signed)
Heart Failure °Heart failure is a condition in which the heart has trouble pumping blood because it has become weak or stiff. This means that the heart does not pump blood efficiently for the body to work well. For some people with heart failure, fluid may back up into the lungs and there may be swelling (edema) in the lower legs. Heart failure is usually a long-term (chronic) condition. It is important for you to take good care of yourself and follow the treatment plan from your health care provider. °What are the causes? °This condition is caused by some health problems, including: °· High blood pressure (hypertension). Hypertension causes the heart muscle to work harder than normal. High blood pressure eventually causes the heart to become stiff and weak. °· Coronary artery disease (CAD). CAD is the buildup of cholesterol and fat (plaques) in the arteries of the heart. °· Heart attack (myocardial infarction). Injured tissue, which is caused by the heart attack, does not contract as well and the heart's ability to pump blood is weakened. °· Abnormal heart valves. When the heart valves do not open and close properly, the heart muscle must pump harder to keep the blood flowing. °· Heart muscle disease (cardiomyopathy or myocarditis). Heart muscle disease is damage to the heart muscle from a variety of causes, such as drug or alcohol abuse, infections, or unknown causes. These can increase the risk of heart failure. °· Lung disease. When the lungs do not work properly, the heart must work harder. ° °What increases the risk? °Risk of heart failure increases as a person ages. This condition is also more likely to develop in people who: °· Are overweight. °· Are female. °· Smoke or chew tobacco. °· Abuse alcohol or illegal drugs. °· Have taken medicines that can damage the heart, such as chemotherapy drugs. °· Have diabetes. °? High blood sugar (glucose) is associated with high fat (lipid) levels in the blood. °? Diabetes  can also damage tiny blood vessels that carry nutrients to the heart muscle. °· Have abnormal heart rhythms. °· Have thyroid problems. °· Have low blood counts (anemia). ° °What are the signs or symptoms? °Symptoms of this condition include: °· Shortness of breath with activity, such as when climbing stairs. °· Persistent cough. °· Swelling of the feet, ankles, legs, or abdomen. °· Unexplained weight gain. °· Difficulty breathing when lying flat (orthopnea). °· Waking from sleep because of the need to sit up and get more air. °· Rapid heartbeat. °· Fatigue and loss of energy. °· Feeling light-headed, dizzy, or close to fainting. °· Loss of appetite. °· Nausea. °· Increased urination during the night (nocturia). °· Confusion. ° °How is this diagnosed? °This condition is diagnosed based on: °· Medical history, symptoms, and a physical exam. °· Diagnostic tests, which may include: °? Echocardiogram. °? Electrocardiogram (ECG). °? Chest X-ray. °? Blood tests. °? Exercise stress test. °? Radionuclide scans. °? Cardiac catheterization and angiogram. ° °How is this treated? °Treatment for this condition is aimed at managing the symptoms of heart failure. Medicines, behavioral changes, or other treatments may be necessary to treat heart failure. °Medicines °These may include: °· Angiotensin-converting enzyme (ACE) inhibitors. This type of medicine blocks the effects of a blood protein called angiotensin-converting enzyme. ACE inhibitors relax (dilate) the blood vessels and help to lower blood pressure. °· Angiotensin receptor blockers (ARBs). This type of medicine blocks the actions of a blood protein called angiotensin. ARBs dilate the blood vessels and help to lower blood pressure. °· Water   pills (diuretics). Diuretics cause the kidneys to remove salt and water from the blood. The extra fluid is removed through urination, leaving a lower volume of blood that the heart has to pump. °· Beta blockers. These improve heart  muscle strength and they prevent the heart from beating too quickly. °· Digoxin. This increases the force of the heartbeat. ° °Healthy behavior changes °These may include: °· Reaching and maintaining a healthy weight. °· Stopping smoking or chewing tobacco. °· Eating heart-healthy foods. °· Limiting or avoiding alcohol. °· Stopping use of street drugs (illegal drugs). °· Physical activity. ° °Other treatments °These may include: °· Surgery to open blocked coronary arteries or repair damaged heart valves. °· Placement of a biventricular pacemaker to improve heart muscle function (cardiac resynchronization therapy). This device paces both the right ventricle and left ventricle. °· Placement of a device to treat serious abnormal heart rhythms (implantable cardioverter defibrillator, or ICD). °· Placement of a device to improve the pumping ability of the heart (left ventricular assist device, or LVAD). °· Heart transplant. This can cure heart failure, and it is considered for certain patients who do not improve with other therapies. ° °Follow these instructions at home: °Medicines °· Take over-the-counter and prescription medicines only as told by your health care provider. Medicines are important in reducing the workload of your heart, slowing the progression of heart failure, and improving your symptoms. °? Do not stop taking your medicine unless your health care provider told you to do that. °? Do not skip any dose of medicine. °? Refill your prescriptions before you run out of medicine. You need your medicines every day. °Eating and drinking ° °· Eat heart-healthy foods. Talk with a dietitian to make an eating plan that is right for you. °? Choose foods that contain no trans fat and are low in saturated fat and cholesterol. Healthy choices include fresh or frozen fruits and vegetables, fish, lean meats, legumes, fat-free or low-fat dairy products, and whole-grain or high-fiber foods. °? Limit salt (sodium) if  directed by your health care provider. Sodium restriction may reduce symptoms of heart failure. Ask a dietitian to recommend heart-healthy seasonings. °? Use healthy cooking methods instead of frying. Healthy methods include roasting, grilling, broiling, baking, poaching, steaming, and stir-frying. °· Limit your fluid intake if directed by your health care provider. Fluid restriction may reduce symptoms of heart failure. °Lifestyle °· Stop smoking or using chewing tobacco. Nicotine and tobacco can damage your heart and your blood vessels. Do not use nicotine gum or patches before talking to your health care provider. °· Limit alcohol intake to no more than 1 drink per day for non-pregnant women and 2 drinks per day for men. One drink equals 12 oz of beer, 5 oz of wine, or 1½ oz of hard liquor. °? Drinking more than that is harmful to your heart. Tell your health care provider if you drink alcohol several times a week. °? Talk with your health care provider about whether any level of alcohol use is safe for you. °? If your heart has already been damaged by alcohol or you have severe heart failure, drinking alcohol should be stopped completely. °· Stop use of illegal drugs. °· Lose weight if directed by your health care provider. Weight loss may reduce symptoms of heart failure. °· Do moderate physical activity if directed by your health care provider. People who are elderly and people with severe heart failure should consult with a health care provider for physical activity recommendations. °  Monitor important information  Weigh yourself every day. Keeping track of your weight daily helps you to notice excess fluid sooner. ? Weigh yourself every morning after you urinate and before you eat breakfast. ? Wear the same amount of clothing each time you weigh yourself. ? Record your daily weight. Provide your health care provider with your weight record.  Monitor and record your blood pressure as told by your health  care provider.  Check your pulse as told by your health care provider. Dealing with extreme temperatures  If the weather is extremely hot: ? Avoid vigorous physical activity. ? Use air conditioning or fans or seek a cooler location. ? Avoid caffeine and alcohol. ? Wear loose-fitting, lightweight, and light-colored clothing.  If the weather is extremely cold: ? Avoid vigorous physical activity. ? Layer your clothes. ? Wear mittens or gloves, a hat, and a scarf when you go outside. ? Avoid alcohol. General instructions  Manage other health conditions such as hypertension, diabetes, thyroid disease, or abnormal heart rhythms as told by your health care provider.  Learn to manage stress. If you need help to do this, ask your health care provider.  Plan rest periods when fatigued.  Get ongoing education and support as needed.  Participate in or seek rehabilitation as needed to maintain or improve independence and quality of life.  Stay up to date with immunizations. Keeping current on pneumococcal and influenza immunizations is especially important to prevent respiratory infections.  Keep all follow-up visits as told by your health care provider. This is important. Contact a health care provider if:  You have a rapid weight gain.  You have increasing shortness of breath that is unusual for you.  You are unable to participate in your usual physical activities.  You tire easily.  You cough more than normal, especially with physical activity.  You have any swelling or more swelling in areas such as your hands, feet, ankles, or abdomen.  You are unable to sleep because it is hard to breathe.  You feel like your heart is beating quickly (palpitations).  You become dizzy or light-headed when you stand up. Get help right away if:  You have difficulty breathing.  You notice or your family notices a change in your awareness, such as having trouble staying awake or having  difficulty with concentration.  You have pain or discomfort in your chest.  You have an episode of fainting (syncope). This information is not intended to replace advice given to you by your health care provider. Make sure you discuss any questions you have with your health care provider. Document Released: 08/07/2005 Document Revised: 04/11/2016 Document Reviewed: 03/01/2016 Elsevier Interactive Patient Education  2017 [employer]   Pneumothorax A pneumothorax, commonly called a collapsed lung, is a condition in which air leaks from a lung and builds up in the space between the lung and the chest wall (pleural space). The air in a pneumothorax is trapped outside the lung and takes up space, preventing the lung from fully expanding. This is a condition that usually occurs suddenly. The buildup of air may be small or large. A small pneumothorax may go away on its own. When a pneumothorax is larger, it will often require medical treatment and hospitalization. What are the causes? A pneumothorax can sometimes happen quickly with no apparent cause. People with underlying lung problems, particularly COPD or emphysema, are at higher risk of pneumothorax. However, pneumothorax can happen quickly even in people with no prior known lung problems.  Trauma, surgery, medical procedures, or injury to the chest wall can also cause a pneumothorax. What are the signs or symptoms? Sometimes a pneumothorax will have no symptoms. When symptoms are present, they can include:  Chest pain.  Shortness of breath.  Increased rate of breathing.  Bluish color to your lips or skin (cyanosis).  How is this diagnosed? Pneumothorax is usually diagnosed by a chest X-ray or chest CT scan. Your health care provider will also take a medical history and perform a physical exam to determine why you may have a pneumothorax. How is this treated? A small pneumothorax may go away on its own without treatment. Extra oxygen can  sometimes help a small pneumothorax go away more quickly. For a larger pneumothorax or a pneumothorax that is causing symptoms, a procedure is usually needed to drain the air.In some cases, the health care provider may drain the air using a needle. In other cases, a chest tube may be inserted into the pleural space. A chest tube is a small tube placed between the ribs and into the pleural space. This removes the extra air and allows the lung to expand back to its normal size. A large pneumothorax will usually require a hospital stay. If there is ongoing air leakage into the pleural space, then the chest tube may need to remain in place for several days until the air leak has healed. In some cases, surgery may be needed. Follow these instructions at home:  Only take over-the-counter or prescription medicines as directed by your health care provider.  If a cough or pain makes it difficult for you to sleep at night, try sleeping in a semi-upright position in a recliner or by using 2 or 3 pillows.  Rest and limit activity as directed by your health care provider.  If you had a chest tube and it was removed, ask your health care provider when it is okay to remove the dressing. Until your health care provider says you can remove the dressing, do not allow it to get wet.  Do not smoke. Smoking is a risk factor for pneumothorax.  Do not fly in an airplane or scuba dive until your health care provider says it is okay.  Follow up with your health care provider as directed. Get help right away if:  You have increasing chest pain or shortness of breath.  You have a cough that is not controlled with suppressants.  You begin coughing up blood.  You have pain that is getting worse or is not controlled with medicines.  You cough up thick, discolored mucus (sputum) that is yellow to green in color.  You have redness, increasing pain, or discharge at the site where a chest tube had been in place (if your  pneumothorax was treated with a chest tube).  The site where your chest tube was located opens up.  You feel air coming out of the site where the chest tube was placed.  You have a fever or persistent symptoms for more than 2-3 days.  You have a fever and your symptoms suddenly get worse. This information is not intended to replace advice given to you by your health care provider. Make sure you discuss any questions you have with your health care provider. Document Released: 08/07/2005 Document Revised: 01/13/2016 Document Reviewed: 12/31/2013 Elsevier Interactive Patient Education  [employer]2018 [employer] Inc.

## 2017-07-05 NOTE — Discharge Summary (Signed)
DISCHARGE SUMMARY  Samantha Dyer  MR#: [id number]  DOB:11/05/1950  Date of Admission: 06/26/2017 Date of Discharge: 07/05/2017  Attending Physician:MCCLUNG,JEFFREY T  Patient's NWG:NFAOZ, Molly Maduro, MD  Consults: PCCM TCTS  Disposition: D/C home   Follow-up Appts: Follow-up Information    Loreli Slot, MD Follow up in 1 week(s).   Specialty:  Cardiothoracic Surgery Contact information: [address] [place] [address] [phone]        Marinda Elk, MD Follow up in 5 day(s).   Specialty:  Family Medicine Contact information: [address] [place] [address] [phone]           Tests Needing Follow-up: -f/u CXR to assess R PTX -recheck Na and volume status in 5-7 days - determine if diuretic dose needs to be adjusted   Discharge Diagnoses: R Pneumothhorax Bilateral pleural effusions Hyponatremia Hypokalemia Hypomagnesemia Hypophosphatemia Acute on chronic Hypoxic respiratory failure - COPD Chronic diastolic CHF CAD status post CABG October 2018 Paroxysmal atrial fibrillation Iron deficiency anemia  Initial presentation: 66 y.o.female with a history of severe COPD just recently placed on 2-2.5L home O2, and CAD status post CABG on 06/06/17 who presented to the ED w/ weakness and lethargy. Workup revealed hyponatremia, hypokalemia, and metabolic alkalosis. Notably the patient had been undergoing aggressive diuresis postoperatively.  Hospital Course: 11/06 - Admit 11/07 - Thoracentesis w/ post-procedure pneumothorax  11/08 - [facility] assumed care   R PTX PCCM followed th/o the hospital stay - TCTS evaluated the pt prior to her d/c - unchanged in f/u CXRs - PCCM debated placing a chest tube during this hospital stay - ultimately they decided against this, but did ask TCTS to evaluate - Dr. Dorris Fetch felt that a small drainage catheter was advisable, but the pt was not interested in that at this time - the pt is  to f/u w/ Dr. Dorris Fetch in the outpt setting in 1 week w/ a f/u CXR  Bilateral pleural effusions secondary to previous thoracic surgery - clinically insignificant at the time of d/c   Hyponatremia initially improved with gentle volume expansion - stalled with DC of IV fluid, but then begain to improve again w/ use of diuretic tx - Na is on an upward trend at the time of d/c   Hypokalemia Due to diuretic - corrected w/ supplementation   Hypomagnesemia Due to diuretic - corrected   Hypophosphatemia Corrected   Acute on chronic Hypoxic respiratory failure - COPD Stable at time of d/c on her home dose of 2.5L - pt confirms she already has home O2 arranged   Chronic diastolic CHF High dose diuretic being required to keep pt euvolemic - will need close monitoring of volume status and daily weights at home - further titration of diuretic dose is likely to be required   CAD status post CABG October 2018 No chest pain during this hospital stay     Paroxysmal atrial fibrillation NSR th/o this hospital stay - appears to have been a transient post-operative issue at time of CABG - was not placed on anticoag previously   Iron deficiency anemia no evidence of blood loss at this time - Hgb stable at time of d/c   Allergies as of 07/05/2017      Reactions   Amoxicillin-pot Clavulanate Swelling   Augmentin [amoxicillin-pot Clavulanate] Swelling      Medication List    STOP taking these medications   guaiFENesin 600 MG 12 hr tablet Commonly known as:  [employer]  predniSONE 10 MG tablet Commonly known as:  DELTASONE     TAKE these medications   acetaminophen 325 MG tablet Commonly known as:  TYLENOL Take 650 mg by mouth every 6 (six) hours as needed for mild pain.   amiodarone 400 MG tablet Commonly known as:  PACERONE Take 1 tablet (400 mg total) daily by mouth.   aspirin 81 MG EC tablet Take 1 tablet (81 mg total) by mouth daily.   atorvastatin 80 MG  tablet Commonly known as:  LIPITOR Take 1 tablet (80 mg total) by mouth daily at 6 PM.   COMBIVENT RESPIMAT 20-100 MCG/ACT Aers respimat Generic drug:  Ipratropium-Albuterol Inhale 1 puff 4 (four) times daily as needed into the lungs for wheezing or shortness of breath. What changed:    when to take this  reasons to take this   ferrous gluconate 324 MG tablet Commonly known as:  FERGON Take 1 tablet (324 mg total) by mouth 2 (two) times daily with a meal.   fluticasone furoate-vilanterol 200-25 MCG/INH Aepb Commonly known as:  BREO ELLIPTA Inhale 1 puff into the lungs daily. What changed:  Another medication with the same name was removed. Continue taking this medication, and follow the directions you see here.   furosemide 80 MG tablet Commonly known as:  LASIX Take 0.5 tablets (40 mg total) 2 (two) times daily by mouth. What changed:    medication strength  additional instructions   metolazone 5 MG tablet Commonly known as:  ZAROXOLYN Take 5 mg daily by mouth.   metoprolol tartrate 25 MG tablet Commonly known as:  LOPRESSOR Take 1 tablet (25 mg total) by mouth 2 (two) times daily.   OXYGEN Inhale 2 L into the lungs continuous.   potassium chloride SA 20 MEQ tablet Commonly known as:  K-DUR,KLOR-CON Take 1 tablet (20 mEq total) daily by mouth.   SPIRIVA HANDIHALER 18 MCG inhalation capsule Generic drug:  tiotropium inhale contents of one capsule once daily What changed:  Another medication with the same name was removed. Continue taking this medication, and follow the directions you see here.   traMADol 50 MG tablet Commonly known as:  ULTRAM Take 1 tablet (50 mg total) by mouth every 4 (four) hours as needed for moderate pain.       Day of Discharge BP (!) 141/50 (BP Location: Right Arm)   Pulse 73   Temp 98.1 F (36.7 C) (Oral)   Resp 18   Ht 5\' 4"  (1.626 m)   Wt 58.4 kg (128 lb 12.8 oz)   SpO2 99%   BMI 22.11 kg/m   Physical Exam: General: No  acute respiratory distress Lungs: Clear to auscultation bilaterally without wheezes or crackles Cardiovascular: Regular rate and rhythm without murmur gallop or rub normal S1 and S2 Abdomen: Nontender, nondistended, soft, bowel sounds positive, no rebound, no ascites, no appreciable mass Extremities: trace B LE edema R > L  Basic Metabolic Panel: Recent Labs  Lab 06/28/17 1545  06/29/17 0239 06/30/17 0248 07/01/17 0413 07/02/17 0349 07/03/17 0507 07/04/17 0645  NA  --    < > 126* 126* 125* 126* 126* 128*  K 4.1  --  3.8 4.1 4.4 4.6 4.3 3.9  CL  --    < > 85* 87* 86* 85* 84* 78*  CO2  --    < > 33* 33* 35* 31 35* 41*  GLUCOSE  --    < > 111* 101* 103* 97 89 108*  BUN  --    < > <  5* <5* <5* 5* <5* <5*  CREATININE  --    < > 0.58 0.62 0.72 0.74 0.69 0.71  CALCIUM  --    < > 8.2* 8.2* 8.4* 8.3* 8.2* 8.5*  MG 1.4*  --  1.9 1.8  --   --   --   --   PHOS 2.3*  --  4.3  --   --   --   --   --    < > = values in this interval not displayed.    Coags: Recent Labs  Lab 07/03/17 0507  INR 1.03    CBC: Recent Labs  Lab 06/29/17 0239 06/30/17 0248 07/01/17 0413 07/04/17 0645  WBC 5.4 4.6 4.0 4.4  HGB 8.3* 7.9* 8.0* 8.4*  HCT 24.1* 24.0* 24.7* 25.6*  MCV 89.6 90.2 90.8 91.4  PLT 147* 134* 138* 159    CBG: Recent Labs  Lab 06/29/17 0832 06/29/17 1207 06/30/17 0854 06/30/17 1215 06/30/17 2150  GLUCAP 115* 116* 122* 147* 139*    Recent Results (from the past 240 hour(s))  Culture, blood (routine x 2)     Status: None   Collection Time: 06/26/17 11:15 AM  Result Value Ref Range Status   Specimen Description BLOOD RIGHT ANTECUBITAL  Final   Special Requests   Final    BOTTLES DRAWN AEROBIC AND ANAEROBIC Blood Culture results may not be optimal due to an excessive volume of blood received in culture bottles   Culture NO GROWTH 5 DAYS  Final   Report Status 07/01/2017 FINAL  Final  Culture, blood (routine x 2)     Status: None   Collection Time: 06/26/17 11:25 AM   Result Value Ref Range Status   Specimen Description BLOOD RIGHT HAND  Final   Special Requests   Final    BOTTLES DRAWN AEROBIC AND ANAEROBIC Blood Culture adequate volume   Culture NO GROWTH 5 DAYS  Final   Report Status 07/01/2017 FINAL  Final  MRSA PCR Screening     Status: None   Collection Time: 06/26/17  3:04 PM  Result Value Ref Range Status   MRSA by PCR NEGATIVE NEGATIVE Final    Comment:        The GeneXpert MRSA Assay (FDA approved for NASAL specimens only), is one component of a comprehensive MRSA colonization surveillance program. It is not intended to diagnose MRSA infection nor to guide or monitor treatment for MRSA infections.   Body fluid culture     Status: None   Collection Time: 06/27/17  3:36 PM  Result Value Ref Range Status   Specimen Description FLUID RIGHT PLEURAL  Final   Special Requests NONE  Final   Gram Stain   Final    MODERATE WBC PRESENT, PREDOMINANTLY MONONUCLEAR NO ORGANISMS SEEN    Culture NO GROWTH 3 DAYS  Final   Report Status 07/01/2017 FINAL  Final     Time spent in discharge (includes decision making & examination of pt): 35 minutes  07/05/2017, 11:07 AM   Lonia BloodJeffrey T. McClung, MD [facility] Office  [phone] Pager [phone]  On-Call/Text Page:      [email or web address]      password [email or web address]

## 2017-07-06 LAB — CHOLESTEROL, BODY FLUID: CHOL FL: 69 mg/dL

## 2017-07-07 DIAGNOSIS — D509 Iron deficiency anemia, unspecified: Secondary | ICD-10-CM | POA: Diagnosis not present

## 2017-07-07 DIAGNOSIS — J449 Chronic obstructive pulmonary disease, unspecified: Secondary | ICD-10-CM | POA: Diagnosis not present

## 2017-07-07 DIAGNOSIS — I480 Paroxysmal atrial fibrillation: Secondary | ICD-10-CM | POA: Diagnosis not present

## 2017-07-07 DIAGNOSIS — Z9981 Dependence on supplemental oxygen: Secondary | ICD-10-CM | POA: Diagnosis not present

## 2017-07-07 DIAGNOSIS — I5032 Chronic diastolic (congestive) heart failure: Secondary | ICD-10-CM | POA: Diagnosis not present

## 2017-07-07 DIAGNOSIS — F17210 Nicotine dependence, cigarettes, uncomplicated: Secondary | ICD-10-CM | POA: Diagnosis not present

## 2017-07-07 DIAGNOSIS — J90 Pleural effusion, not elsewhere classified: Secondary | ICD-10-CM | POA: Diagnosis not present

## 2017-07-07 DIAGNOSIS — Z951 Presence of aortocoronary bypass graft: Secondary | ICD-10-CM | POA: Diagnosis not present

## 2017-07-07 DIAGNOSIS — J95811 Postprocedural pneumothorax: Secondary | ICD-10-CM | POA: Diagnosis not present

## 2017-07-07 DIAGNOSIS — I2510 Atherosclerotic heart disease of native coronary artery without angina pectoris: Secondary | ICD-10-CM | POA: Diagnosis not present

## 2017-07-07 DIAGNOSIS — J9621 Acute and chronic respiratory failure with hypoxia: Secondary | ICD-10-CM | POA: Diagnosis not present

## 2017-07-09 DIAGNOSIS — J440 Chronic obstructive pulmonary disease with acute lower respiratory infection: Secondary | ICD-10-CM | POA: Diagnosis not present

## 2017-07-09 DIAGNOSIS — J9691 Respiratory failure, unspecified with hypoxia: Secondary | ICD-10-CM | POA: Diagnosis not present

## 2017-07-09 DIAGNOSIS — I5033 Acute on chronic diastolic (congestive) heart failure: Secondary | ICD-10-CM | POA: Diagnosis not present

## 2017-07-10 ENCOUNTER — Telehealth: Payer: Self-pay | Admitting: Adult Health

## 2017-07-10 ENCOUNTER — Ambulatory Visit: Payer: Medicare Other | Admitting: Thoracic Surgery (Cardiothoracic Vascular Surgery)

## 2017-07-10 NOTE — Telephone Encounter (Signed)
lmtcb x1 for pt. 

## 2017-07-11 ENCOUNTER — Other Ambulatory Visit: Payer: Self-pay | Admitting: Thoracic Surgery (Cardiothoracic Vascular Surgery)

## 2017-07-11 ENCOUNTER — Telehealth: Payer: Self-pay | Admitting: Pulmonary Disease

## 2017-07-11 DIAGNOSIS — Z951 Presence of aortocoronary bypass graft: Secondary | ICD-10-CM

## 2017-07-11 DIAGNOSIS — I2510 Atherosclerotic heart disease of native coronary artery without angina pectoris: Secondary | ICD-10-CM | POA: Diagnosis not present

## 2017-07-11 DIAGNOSIS — J95811 Postprocedural pneumothorax: Secondary | ICD-10-CM | POA: Diagnosis not present

## 2017-07-11 DIAGNOSIS — J9611 Chronic respiratory failure with hypoxia: Secondary | ICD-10-CM

## 2017-07-11 DIAGNOSIS — J449 Chronic obstructive pulmonary disease, unspecified: Secondary | ICD-10-CM

## 2017-07-11 DIAGNOSIS — D509 Iron deficiency anemia, unspecified: Secondary | ICD-10-CM | POA: Diagnosis not present

## 2017-07-11 DIAGNOSIS — E871 Hypo-osmolality and hyponatremia: Secondary | ICD-10-CM | POA: Diagnosis not present

## 2017-07-11 DIAGNOSIS — J9621 Acute and chronic respiratory failure with hypoxia: Secondary | ICD-10-CM | POA: Diagnosis not present

## 2017-07-11 DIAGNOSIS — I5032 Chronic diastolic (congestive) heart failure: Secondary | ICD-10-CM | POA: Diagnosis not present

## 2017-07-11 NOTE — Telephone Encounter (Signed)
Pt is aware of approval for order and order sent as Urgent to [facility]. Nothing more needed at this time.

## 2017-07-11 NOTE — Telephone Encounter (Signed)
Okay to place? 

## 2017-07-11 NOTE — Telephone Encounter (Signed)
Duplicate message-see phone note dated 07/10/17

## 2017-07-11 NOTE — Telephone Encounter (Signed)
[facility]. Pt states her current portable tank only last an hour.  RA please advise if okay to order. Thanks.

## 2017-07-13 ENCOUNTER — Telehealth: Payer: Self-pay | Admitting: Pulmonary Disease

## 2017-07-13 DIAGNOSIS — J441 Chronic obstructive pulmonary disease with (acute) exacerbation: Secondary | ICD-10-CM

## 2017-07-13 NOTE — Telephone Encounter (Signed)
I called and [facility] [phone] she stated that the patient requested that they pick up the Etanks last year.  So for her to get the Etanks the patient will have to be requalified with the 02 SATS in the office for them to help get her the [place],

## 2017-07-13 NOTE — Telephone Encounter (Signed)
Spoke with pt, [facility]. [facility] so she wants to try to send a order there. PCC's can we send the order to them? Please advise.    [facility] Phone: [phone]

## 2017-07-16 ENCOUNTER — Ambulatory Visit (INDEPENDENT_AMBULATORY_CARE_PROVIDER_SITE_OTHER): Payer: Medicare Other | Admitting: *Deleted

## 2017-07-16 ENCOUNTER — Ambulatory Visit: Payer: Medicare Other

## 2017-07-16 DIAGNOSIS — R0602 Shortness of breath: Secondary | ICD-10-CM | POA: Diagnosis not present

## 2017-07-16 NOTE — Addendum Note (Signed)
Addended by: Cydney OkAUGUSTIN, Terrick Allred N on: 07/16/2017 11:55 AM   Modules accepted: Orders

## 2017-07-16 NOTE — Telephone Encounter (Signed)
Pt has been scheduled for qualifying walk today at 11:30. Nothing further needed at this time.

## 2017-07-16 NOTE — Telephone Encounter (Addendum)
Walked pt today and will place order for e tanks. Nothing further is needed.

## 2017-07-16 NOTE — Addendum Note (Signed)
Addended by: Cydney OkAUGUSTIN, Khadim Lundberg N on: 07/16/2017 02:02 PM   Modules accepted: Orders

## 2017-07-17 ENCOUNTER — Ambulatory Visit: Payer: Self-pay | Admitting: Thoracic Surgery (Cardiothoracic Vascular Surgery)

## 2017-07-17 ENCOUNTER — Ambulatory Visit: Payer: Medicare Other | Admitting: Thoracic Surgery (Cardiothoracic Vascular Surgery)

## 2017-07-19 ENCOUNTER — Ambulatory Visit: Payer: No Typology Code available for payment source | Admitting: Adult Health

## 2017-07-23 ENCOUNTER — Telehealth: Payer: Self-pay | Admitting: Adult Health

## 2017-07-23 NOTE — Telephone Encounter (Signed)
ATC pt, no answer. No VM.   I will try to call again tomorrow.  I need to ask pt did she come in for 6 min walk test.

## 2017-07-24 DIAGNOSIS — J449 Chronic obstructive pulmonary disease, unspecified: Secondary | ICD-10-CM | POA: Diagnosis not present

## 2017-07-24 DIAGNOSIS — R600 Localized edema: Secondary | ICD-10-CM | POA: Diagnosis not present

## 2017-07-24 DIAGNOSIS — J9691 Respiratory failure, unspecified with hypoxia: Secondary | ICD-10-CM | POA: Diagnosis not present

## 2017-07-24 DIAGNOSIS — I5033 Acute on chronic diastolic (congestive) heart failure: Secondary | ICD-10-CM | POA: Diagnosis not present

## 2017-07-24 DIAGNOSIS — E871 Hypo-osmolality and hyponatremia: Secondary | ICD-10-CM | POA: Diagnosis not present

## 2017-07-24 DIAGNOSIS — N3944 Nocturnal enuresis: Secondary | ICD-10-CM | POA: Diagnosis not present

## 2017-07-24 NOTE — Telephone Encounter (Signed)
Jess she is coming in tomorrow, we may have to repeat the walk, I am guessing they need the values of the the 3 laps. I apologize this was not done.

## 2017-07-24 NOTE — Telephone Encounter (Signed)
Pt did not come in for a 6 minute walk. Per our documentation, she only had a qualifying walk. lmtcb x[facility]

## 2017-07-24 NOTE — Telephone Encounter (Signed)
She came in for a qualifying walk so the 6 min walk wasn't documented. [facility] the results from the walk.

## 2017-07-25 NOTE — Telephone Encounter (Signed)
Patient is scheduled for the [facility] office Routing to Samantha Dyer who will be going to the [facility] office with TP

## 2017-07-26 ENCOUNTER — Ambulatory Visit (INDEPENDENT_AMBULATORY_CARE_PROVIDER_SITE_OTHER): Payer: Medicare Other | Admitting: Adult Health

## 2017-07-26 ENCOUNTER — Inpatient Hospital Stay (HOSPITAL_COMMUNITY)
Admission: EM | Admit: 2017-07-26 | Discharge: 2017-08-03 | DRG: 291 | Disposition: A | Discharge status: Hospice | Payer: Medicare Other | Attending: Internal Medicine | Admitting: Internal Medicine

## 2017-07-26 ENCOUNTER — Encounter: Payer: Self-pay | Admitting: Adult Health

## 2017-07-26 ENCOUNTER — Encounter (HOSPITAL_COMMUNITY): Payer: Self-pay | Admitting: Emergency Medicine

## 2017-07-26 ENCOUNTER — Emergency Department (HOSPITAL_COMMUNITY): Payer: Medicare Other

## 2017-07-26 DIAGNOSIS — E871 Hypo-osmolality and hyponatremia: Secondary | ICD-10-CM

## 2017-07-26 DIAGNOSIS — R0602 Shortness of breath: Secondary | ICD-10-CM | POA: Diagnosis not present

## 2017-07-26 DIAGNOSIS — I2510 Atherosclerotic heart disease of native coronary artery without angina pectoris: Secondary | ICD-10-CM | POA: Diagnosis not present

## 2017-07-26 DIAGNOSIS — R339 Retention of urine, unspecified: Secondary | ICD-10-CM | POA: Diagnosis not present

## 2017-07-26 DIAGNOSIS — Z7189 Other specified counseling: Secondary | ICD-10-CM

## 2017-07-26 DIAGNOSIS — J9622 Acute and chronic respiratory failure with hypercapnia: Secondary | ICD-10-CM | POA: Diagnosis present

## 2017-07-26 DIAGNOSIS — Z881 Allergy status to other antibiotic agents status: Secondary | ICD-10-CM

## 2017-07-26 DIAGNOSIS — I255 Ischemic cardiomyopathy: Secondary | ICD-10-CM | POA: Diagnosis not present

## 2017-07-26 DIAGNOSIS — J9621 Acute and chronic respiratory failure with hypoxia: Secondary | ICD-10-CM | POA: Diagnosis not present

## 2017-07-26 DIAGNOSIS — Z880 Allergy status to penicillin: Secondary | ICD-10-CM

## 2017-07-26 DIAGNOSIS — Z515 Encounter for palliative care: Secondary | ICD-10-CM

## 2017-07-26 DIAGNOSIS — F17210 Nicotine dependence, cigarettes, uncomplicated: Secondary | ICD-10-CM | POA: Diagnosis present

## 2017-07-26 DIAGNOSIS — J90 Pleural effusion, not elsewhere classified: Secondary | ICD-10-CM | POA: Diagnosis present

## 2017-07-26 DIAGNOSIS — D509 Iron deficiency anemia, unspecified: Secondary | ICD-10-CM | POA: Diagnosis present

## 2017-07-26 DIAGNOSIS — Z79899 Other long term (current) drug therapy: Secondary | ICD-10-CM

## 2017-07-26 DIAGNOSIS — E8770 Fluid overload, unspecified: Secondary | ICD-10-CM | POA: Diagnosis not present

## 2017-07-26 DIAGNOSIS — E869 Volume depletion, unspecified: Secondary | ICD-10-CM | POA: Diagnosis present

## 2017-07-26 DIAGNOSIS — I5033 Acute on chronic diastolic (congestive) heart failure: Secondary | ICD-10-CM | POA: Diagnosis not present

## 2017-07-26 DIAGNOSIS — I5032 Chronic diastolic (congestive) heart failure: Secondary | ICD-10-CM | POA: Diagnosis not present

## 2017-07-26 DIAGNOSIS — Z7951 Long term (current) use of inhaled steroids: Secondary | ICD-10-CM

## 2017-07-26 DIAGNOSIS — Z9981 Dependence on supplemental oxygen: Secondary | ICD-10-CM

## 2017-07-26 DIAGNOSIS — T501X5A Adverse effect of loop [high-ceiling] diuretics, initial encounter: Secondary | ICD-10-CM | POA: Diagnosis not present

## 2017-07-26 DIAGNOSIS — E874 Mixed disorder of acid-base balance: Secondary | ICD-10-CM | POA: Diagnosis not present

## 2017-07-26 DIAGNOSIS — E222 Syndrome of inappropriate secretion of antidiuretic hormone: Secondary | ICD-10-CM | POA: Diagnosis not present

## 2017-07-26 DIAGNOSIS — E785 Hyperlipidemia, unspecified: Secondary | ICD-10-CM | POA: Diagnosis present

## 2017-07-26 DIAGNOSIS — R402413 Glasgow coma scale score 13-15, at hospital admission: Secondary | ICD-10-CM | POA: Diagnosis present

## 2017-07-26 DIAGNOSIS — E876 Hypokalemia: Secondary | ICD-10-CM | POA: Diagnosis not present

## 2017-07-26 DIAGNOSIS — R338 Other retention of urine: Secondary | ICD-10-CM | POA: Clinically undetermined

## 2017-07-26 DIAGNOSIS — M549 Dorsalgia, unspecified: Secondary | ICD-10-CM | POA: Diagnosis present

## 2017-07-26 DIAGNOSIS — Z66 Do not resuscitate: Secondary | ICD-10-CM

## 2017-07-26 DIAGNOSIS — Z951 Presence of aortocoronary bypass graft: Secondary | ICD-10-CM

## 2017-07-26 DIAGNOSIS — J449 Chronic obstructive pulmonary disease, unspecified: Secondary | ICD-10-CM | POA: Diagnosis present

## 2017-07-26 DIAGNOSIS — J9611 Chronic respiratory failure with hypoxia: Secondary | ICD-10-CM

## 2017-07-26 DIAGNOSIS — J9383 Other pneumothorax: Secondary | ICD-10-CM

## 2017-07-26 DIAGNOSIS — Z8709 Personal history of other diseases of the respiratory system: Secondary | ICD-10-CM

## 2017-07-26 DIAGNOSIS — I480 Paroxysmal atrial fibrillation: Secondary | ICD-10-CM | POA: Diagnosis present

## 2017-07-26 DIAGNOSIS — L8990 Pressure ulcer of unspecified site, unspecified stage: Secondary | ICD-10-CM

## 2017-07-26 DIAGNOSIS — I5031 Acute diastolic (congestive) heart failure: Secondary | ICD-10-CM | POA: Diagnosis present

## 2017-07-26 DIAGNOSIS — Z7984 Long term (current) use of oral hypoglycemic drugs: Secondary | ICD-10-CM

## 2017-07-26 LAB — BLOOD GAS, VENOUS
Acid-Base Excess: 14.7 mmol/L — ABNORMAL HIGH (ref 0.0–2.0)
BICARBONATE: 41.8 mmol/L — AB (ref 20.0–28.0)
O2 CONTENT: 2 L/min
O2 Saturation: 77.1 %
PCO2 VEN: 68.7 mmHg — AB (ref 44.0–60.0)
PO2 VEN: 41.6 mmHg (ref 32.0–45.0)
Patient temperature: 98.6
pH, Ven: 7.402 (ref 7.250–7.430)

## 2017-07-26 LAB — BASIC METABOLIC PANEL
ANION GAP: 10 (ref 5–15)
BUN: 7 mg/dL (ref 6–20)
CALCIUM: 8.5 mg/dL — AB (ref 8.9–10.3)
CO2: 44 mmol/L — ABNORMAL HIGH (ref 22–32)
Chloride: 66 mmol/L — ABNORMAL LOW (ref 101–111)
Creatinine, Ser: 0.53 mg/dL (ref 0.44–1.00)
GFR calc Af Amer: >60 mL/min (ref >60)
GLUCOSE: 119 mg/dL — AB (ref 65–99)
POTASSIUM: 3.7 mmol/L (ref 3.5–5.1)
SODIUM: 120 mmol/L — AB (ref 135–145)

## 2017-07-26 LAB — CBC WITH DIFFERENTIAL/PLATELET
BASOS ABS: 0 10*3/uL (ref 0.0–0.1)
Basophils Relative: 0 %
EOS ABS: 0.1 10*3/uL (ref 0.0–0.7)
Eosinophils Relative: 1 %
HCT: 30.1 % — ABNORMAL LOW (ref 36.0–46.0)
Hemoglobin: 10.1 g/dL — ABNORMAL LOW (ref 12.0–15.0)
Lymphocytes Relative: 5 %
Lymphs Abs: 0.3 10*3/uL — ABNORMAL LOW (ref 0.7–4.0)
MCH: 29.2 pg (ref 26.0–34.0)
MCHC: 33.6 g/dL (ref 30.0–36.0)
MCV: 87 fL (ref 78.0–100.0)
MONO ABS: 0.3 10*3/uL (ref 0.1–1.0)
Monocytes Relative: 5 %
Neutro Abs: 5.7 10*3/uL (ref 1.7–7.7)
Neutrophils Relative %: 89 %
PLATELETS: 178 10*3/uL (ref 150–400)
RBC: 3.46 MIL/uL — AB (ref 3.87–5.11)
RDW: 13.9 % (ref 11.5–15.5)
WBC: 6.4 10*3/uL (ref 4.0–10.5)

## 2017-07-26 LAB — I-STAT TROPONIN, ED: Troponin i, poc: 0 ng/mL (ref 0.00–0.08)

## 2017-07-26 LAB — BRAIN NATRIURETIC PEPTIDE: B NATRIURETIC PEPTIDE 5: 838.8 pg/mL — AB (ref 0.0–100.0)

## 2017-07-26 MED ORDER — ACETAMINOPHEN 325 MG PO TABS
650.0000 mg | ORAL_TABLET | Freq: Four times a day (QID) | ORAL | Status: DC | PRN
  Administered 2017-07-30: 650 mg via ORAL
  Filled 2017-07-26: qty 2

## 2017-07-26 MED ORDER — FLUTICASONE FUROATE-VILANTEROL 200-25 MCG/INH IN AEPB
1.0000 | INHALATION_SPRAY | Freq: Every day | RESPIRATORY_TRACT | Status: DC
  Administered 2017-07-28 – 2017-08-03 (×4): 1 via RESPIRATORY_TRACT
  Filled 2017-07-26 (×2): qty 28

## 2017-07-26 MED ORDER — IPRATROPIUM-ALBUTEROL 0.5-2.5 (3) MG/3ML IN SOLN: 3.0000 mL | Freq: Four times a day (QID) | RESPIRATORY_TRACT | Status: DC | PRN

## 2017-07-26 MED ORDER — AMIODARONE HCL 200 MG PO TABS
400.0000 mg | ORAL_TABLET | Freq: Every day | ORAL | Status: DC
  Administered 2017-07-27 – 2017-08-03 (×8): 400 mg via ORAL
  Filled 2017-07-26 (×8): qty 2

## 2017-07-26 MED ORDER — ASPIRIN EC 81 MG PO TBEC
81.0000 mg | DELAYED_RELEASE_TABLET | Freq: Every day | ORAL | Status: DC
  Administered 2017-07-27 – 2017-08-03 (×8): 81 mg via ORAL
  Filled 2017-07-26 (×8): qty 1

## 2017-07-26 MED ORDER — ATORVASTATIN CALCIUM 40 MG PO TABS
80.0000 mg | ORAL_TABLET | Freq: Every day | ORAL | Status: DC
  Administered 2017-07-27 – 2017-08-02 (×7): 80 mg via ORAL
  Filled 2017-07-26 (×8): qty 2

## 2017-07-26 MED ORDER — INSULIN ASPART 100 UNIT/ML ~~LOC~~ SOLN
0.0000 [IU] | Freq: Three times a day (TID) | SUBCUTANEOUS | Status: DC
  Administered 2017-07-27 – 2017-08-01 (×8): 2 [IU] via SUBCUTANEOUS
  Administered 2017-08-01: 3 [IU] via SUBCUTANEOUS
  Administered 2017-08-02: 2 [IU] via SUBCUTANEOUS
  Administered 2017-08-03: 3 [IU] via SUBCUTANEOUS

## 2017-07-26 MED ORDER — INSULIN ASPART 100 UNIT/ML ~~LOC~~ SOLN
0.0000 [IU] | Freq: Every day | SUBCUTANEOUS | Status: DC
Start: 2017-07-26 — End: 2017-07-30

## 2017-07-26 MED ORDER — METOPROLOL TARTRATE 25 MG PO TABS
25.0000 mg | ORAL_TABLET | Freq: Two times a day (BID) | ORAL | Status: DC
  Administered 2017-07-27 – 2017-08-03 (×14): 25 mg via ORAL
  Filled 2017-07-26 (×15): qty 1

## 2017-07-26 MED ORDER — FERROUS GLUCONATE 324 (38 FE) MG PO TABS
324.0000 mg | ORAL_TABLET | Freq: Two times a day (BID) | ORAL | Status: DC
  Administered 2017-07-27 – 2017-08-03 (×14): 324 mg via ORAL
  Filled 2017-07-26 (×19): qty 1

## 2017-07-26 MED ORDER — IPRATROPIUM-ALBUTEROL 20-100 MCG/ACT IN AERS: 1.0000 | INHALATION_SPRAY | Freq: Four times a day (QID) | RESPIRATORY_TRACT | Status: DC | PRN

## 2017-07-26 NOTE — Assessment & Plan Note (Signed)
GOLD IV COPD on O2 - does not appear to be having an exacerbation but remains very weak with multiple co-morbidities .  Continue on BREO and Spiriva daily  Cont w/ diuresis per cards .  F/up w/ PCP for ongoing hyponatremia issues.  Needs smoking cessation  Will need to address pleural effusion with TCS   Plan  Patient Instructions  Continue on BREO and Spiriva  Continue on oxygen 2l/m  MUST STOP SMOKING   Follow up with Primary Care doctor today as planned today .  Follow up Dr. Kendrick FriesMcQuaid in 1 month and As needed   Follow up Dr. Dorris FetchHendrickson next week as planned and As needed  (if it canceled by snow , please call and have it rescheduled.)  Please contact office for sooner follow up if symptoms do not improve or worsen or seek emergency care

## 2017-07-26 NOTE — ED Notes (Signed)
I gave Istat Chem 8 critical result to MD Adela LankFloyd

## 2017-07-26 NOTE — Assessment & Plan Note (Signed)
Right sided Pleural effusion s/p thoracentesis on 06/27/17 neg cx/cytolgy , c/w exudative process.  Recent CABG  - pt is following with TCS on 07/30/17 will need to decided if thoracentesis is indicated.

## 2017-07-26 NOTE — H&P (Signed)
History and Physical    Samantha DriversKathy D Dyer QIH:[id number] DOB: 06/29/1951 DOA: 07/26/2017  PCP: Terri PiedraForcucci, Courtney, PA-C Patient coming from: Home  Chief Complaint: Sent by PCP  HPI: Samantha Dyer is a 66 y.o. female with medical history significant of coronary artery disease status post CABG in October 2018, advanced COPD on home oxygen, GERD, iron deficiency anemia comes to the hospital for evaluation of hyponatremia.  Patient was admitted to the hospital last month for evaluation of weakness and fatigue and was found to have multiple electrolyte imbalance.  Also had large pleural effusion for which patient underwent thoracentesis and unfortunately subsequently developed pneumothorax for which chest tube was placed.  At first patient was given fluids which improved sodium and then diuresed which further improved sodium.  Eventually she was discharged. After going home patient states initially she continued to feel well and at her baseline.  She uses 2 L of nasal cannula at rest and two-point 5 at night.  She was seen by her PCP recently who performed routine labs.  Today she was notified that her sodium was low therefore advised to come to the ER. In the ER patient sodium was 120, chloride 44, bicarb 43, BNP of 838.  Chest x-ray showing right-sided pleural effusion slightly increased from previous x-ray at the time of discharge. Her baseline sodium last month was in 120s and was discharged with sodium of 128.  Prior to last month it was in 130s range.  Was determined to admit her to further assess and determine the treatment course for hyponatremia.    Review of Systems: As per HPI otherwise 10 point review of systems negative.   Past Medical History:  Diagnosis Date  . Allergic rhinitis, cause unspecified   . Anemia, unspecified   . Backache, unspecified   . CAD (coronary artery disease)    a. cath on 06/01/17 showing severe 3-vessel CAD --> s/p CABG on 06/06/2017 with LIMA-LAD, SVG-PDA, and  Seq SVG-RI1-RI2.  Marland Kitchen. COPD (chronic obstructive pulmonary disease) (HCC)   . Esophageal reflux   . Iron deficiency anemia, unspecified     Past Surgical History:  Procedure Laterality Date  . CORONARY ARTERY BYPASS GRAFT N/A 06/06/2017   Procedure: CORONARY ARTERY BYPASS GRAFTING x 4 USING LEFT INTERNAL MAMMARY ARTERY AND RIGHT GREATER SAPHENOUS VEIN HARVESTED ENDOSCOPICALLY -LIMA to LAD -SVG to PDA -SEQ SVG to RAMUS 1 and RAMUS 2;  Surgeon: Loreli SlotHendrickson, Steven C, MD;  [facility];  Service: Open Heart Surgery;  Laterality: N/A;  . RIGHT/LEFT HEART CATH AND CORONARY ANGIOGRAPHY N/A 06/01/2017   Procedure: RIGHT/LEFT HEART CATH AND CORONARY ANGIOGRAPHY;  Surgeon: Corky CraftsVaranasi, Jayadeep S, MD;  [facility];  Service: Cardiovascular;  Laterality: N/A;  . TEE WITHOUT CARDIOVERSION N/A 06/06/2017   Procedure: TRANSESOPHAGEAL ECHOCARDIOGRAM (TEE);  Surgeon: Loreli SlotHendrickson, Steven C, MD;  [facility];  Service: Open Heart Surgery;  Laterality: N/A;     reports that she has been smoking cigarettes.  She has a 43.00 pack-year smoking history. she has never used smokeless tobacco. She reports that she does not drink alcohol or use drugs.  Allergies  Allergen Reactions  . Amoxicillin-Pot Clavulanate Swelling  . Augmentin [Amoxicillin-Pot Clavulanate] Swelling    Family History  Problem Relation Age of Onset  . Emphysema Mother   . Heart disease Mother   . Breast cancer Mother   . Pancreatic cancer Father     Acceptable: Family history reviewed and not pertinent (If you reviewed it)  Prior  to Admission medications   Medication Sig Start Date End Date Taking? Authorizing Provider  acetaminophen (TYLENOL) 325 MG tablet Take 650 mg by mouth every 6 (six) hours as needed for mild pain.   Yes [provider]  amiodarone (PACERONE) 400 MG tablet Take 1 tablet (400 mg total) daily by mouth. 06/25/17  Yes Strader, Lennart PallBrittany M, PA-C  aspirin EC 81 MG EC tablet Take 1 tablet  (81 mg total) by mouth daily. 06/14/17  Yes Barrett, Erin R, PA-C  atorvastatin (LIPITOR) 80 MG tablet Take 1 tablet (80 mg total) by mouth daily at 6 PM. 06/14/17  Yes Barrett, Erin R, PA-C  COMBIVENT RESPIMAT 20-100 MCG/ACT AERS respimat Inhale 1 puff 4 (four) times daily as needed into the lungs for wheezing or shortness of breath. 07/05/17  Yes Lonia BloodMcClung, Jeffrey T, MD  ferrous gluconate (FERGON) 324 MG tablet Take 1 tablet (324 mg total) by mouth 2 (two) times daily with a meal. 06/14/17  Yes Barrett, Erin R, PA-C  fluticasone furoate-vilanterol (BREO ELLIPTA) 200-25 MCG/INH AEPB Inhale 1 puff into the lungs daily. 09/21/16  Yes Parrett, Virgel Bouquetammy S, NP  furosemide (LASIX) 80 MG tablet Take 0.5 tablets (40 mg total) 2 (two) times daily by mouth. 07/05/17  Yes McClung, Elpidio EricJeffrey T, MD  metolazone (ZAROXOLYN) 5 MG tablet Take 5 mg daily by mouth.   Yes [provider]  metoprolol tartrate (LOPRESSOR) 25 MG tablet Take 1 tablet (25 mg total) by mouth 2 (two) times daily. 06/14/17  Yes Barrett, Erin R, PA-C  OXYGEN Inhale 2 L into the lungs continuous.   Yes [provider]  potassium chloride SA (K-DUR,KLOR-CON) 20 MEQ tablet Take 1 tablet (20 mEq total) daily by mouth. Patient taking differently: Take 10 mEq by mouth daily.  07/05/17  Yes Lonia BloodMcClung, Jeffrey T, MD  tiotropium (SPIRIVA HANDIHALER) 18 MCG inhalation capsule inhale contents of one capsule once daily 03/15/16  Yes [provider]  traMADol (ULTRAM) 50 MG tablet Take 1 tablet (50 mg total) by mouth every 4 (four) hours as needed for moderate pain. Patient not taking: Reported on 07/26/2017 06/13/17   Zada GirtBarrett, Erin R, PA-C    Physical Exam: Vitals:   07/26/17 2004 07/26/17 2057  BP: (!) 153/62 (!) 153/62  Pulse:  79  Resp: 20 18  Temp: 97.8 F (36.6 C)   TempSrc: Oral   SpO2: 96% 100%      Constitutional: NAD, calm, comfortable Vitals:   07/26/17 2004 07/26/17 2057  BP: (!) 153/62 (!) 153/62  Pulse:  79    Resp: 20 18  Temp: 97.8 F (36.6 C)   TempSrc: Oral   SpO2: 96% 100%   Eyes: PERRL, lids and conjunctivae normal ENMT: Mucous membranes are moist. Posterior pharynx clear of any exudate or lesions.Normal dentition.  Neck: normal, supple, no masses, no thyromegaly Respiratory: Diminished breath sounds at the bases Cardiovascular: CABG scar middle of her chest without any signs of active bleeding or infection regular rate and rhythm, no murmurs / rubs / gallops. No extremity edema. 2+ pedal pulses. No carotid bruits.  Abdomen: no tenderness, no masses palpated. No hepatosplenomegaly. Bowel sounds positive.  Musculoskeletal: no clubbing / cyanosis. No joint deformity upper and lower extremities. Good ROM, no contractures. Normal muscle tone.  Skin: no rashes, lesions, ulcers. No induration Neurologic: CN 2-12 grossly intact. Sensation intact, DTR normal. Strength 5/5 in all 4.  Psychiatric: Normal judgment and insight. Alert and oriented x 3. Normal mood.     Labs  on Admission: I have personally reviewed following labs and imaging studies  CBC: Recent Labs  Lab 07/26/17 2051  WBC 6.4  NEUTROABS 5.7  HGB 10.1*  HCT 30.1*  MCV 87.0  PLT 178   Basic Metabolic Panel: Recent Labs  Lab 07/26/17 2051  NA 120*  K 3.7  CL 66*  CO2 44*  GLUCOSE 119*  BUN 7  CREATININE 0.53  CALCIUM 8.5*   GFR: CrCl cannot be calculated (Unknown ideal weight.). Liver Function Tests: No results for input(s): AST, ALT, ALKPHOS, BILITOT, PROT, ALBUMIN in the last 168 hours. No results for input(s): LIPASE, AMYLASE in the last 168 hours. No results for input(s): AMMONIA in the last 168 hours. Coagulation Profile: No results for input(s): INR, PROTIME in the last 168 hours. Cardiac Enzymes: No results for input(s): CKTOTAL, CKMB, CKMBINDEX, TROPONINI in the last 168 hours. BNP (last 3 results) No results for input(s): PROBNP in the last 8760 hours. HbA1C: No results for input(s): HGBA1C in  the last 72 hours. CBG: No results for input(s): GLUCAP in the last 168 hours. Lipid Profile: No results for input(s): CHOL, HDL, LDLCALC, TRIG, CHOLHDL, LDLDIRECT in the last 72 hours. Thyroid Function Tests: No results for input(s): TSH, T4TOTAL, FREET4, T3FREE, THYROIDAB in the last 72 hours. Anemia Panel: No results for input(s): VITAMINB12, FOLATE, FERRITIN, TIBC, IRON, RETICCTPCT in the last 72 hours. Urine analysis:    Component Value Date/Time   COLORURINE YELLOW 06/26/2017 1541   APPEARANCEUR CLEAR 06/26/2017 1541   LABSPEC 1.034 (H) 06/26/2017 1541   PHURINE 9.0 (H) 06/26/2017 1541   GLUCOSEU NEGATIVE 06/26/2017 1541   HGBUR NEGATIVE 06/26/2017 1541   BILIRUBINUR NEGATIVE 06/26/2017 1541   KETONESUR NEGATIVE 06/26/2017 1541   PROTEINUR NEGATIVE 06/26/2017 1541   NITRITE NEGATIVE 06/26/2017 1541   LEUKOCYTESUR NEGATIVE 06/26/2017 1541   Sepsis Labs: !!!!!!!!!!!!!!!!!!!!!!!!!!!!!!!!!!!!!!!!!!!! @LABRCNTIP (procalcitonin:4,lacticidven:4) )No results found for this or any previous visit (from the past 240 hour(s)).   Radiological Exams on Admission: Dg Chest 2 View  Result Date: 07/26/2017 CLINICAL DATA:  Shortness of breath for 1 month EXAM: CHEST  2 VIEW COMPARISON:  07/24/2017 FINDINGS: Cardiac shadow is stable. Postoperative changes are again seen. Right-sided pleural effusion is noted slightly greater than that seen on the prior exam. Mild vascular congestion is noted new from the prior study. No other focal abnormality is seen. IMPRESSION: Slight increase in right-sided pleural effusion. Vascular congestion. Electronically Signed   By: Alcide Clever M.D.   On: 07/26/2017 21:00    EKG: Independently reviewed.  Assessment/Plan Active Problems:   Hyponatremia   Hyponatremia -Admit for further evaluation for observation -Unknown exact etiology- hypervolemia versus hypovolemia. CXR shows Right sided pleural effusion (sligtly increased from before).  -We will check urine  electrolytes, urine awesome and serum.  This will help determining fluid restriction versus gentle hydration. -We will hold Lasix and metolazone for now, will address it once the above mentioned studies are back.  -Caution with correction.  BNP is around 800, was around 500 during last admission  Chronic hypoxic respiratory failure COPD with home oxygen, 2 L at rest and 2.5 L at night Right-sided pleural effusion -Continue nebulizer treatments, supplemental oxygen and supportive care -Thoracentesis with post procedure PTX on June 27, 2017.    CAD status post CABG in October 2018 Chronic diastolic CHF - Currently she is symptom-free from a cardiac perspective, BNP is around 800 -Continue aspirin, statin, beta-blocker.  Holding Lasix and metolazone for now -Echocardiogram from last month noted-ejection fraction  55-60%, moderate LVH, grade 2 diastolic dysfunction  Paroxysmal atrial fibrillation -Continue amiodarone  Iron deficiency anemia -Continue iron supplements  DVT prophylaxis: SCDs Code Status: Full  Family Communication: None at bedside   Disposition Plan: TBD Consults called: None Admission status: Obs   Prabhjot Maddux Joline Maxcy MD [facility]   If 7PM-7AM, please contact night-coverage [email or web address] Password [facility]  07/26/2017, 10:33 PM

## 2017-07-26 NOTE — Patient Instructions (Signed)
Continue on BREO and Spiriva  Continue on oxygen 2l/m  MUST STOP SMOKING   Follow up with Primary Care doctor today as planned today .  Follow up Dr. Kendrick FriesMcQuaid in 1 month and As needed   Follow up Dr. Dorris FetchHendrickson next week as planned and As needed  (if it canceled by snow , please call and have it rescheduled.)  Please contact office for sooner follow up if symptoms do not improve or worsen or seek emergency care

## 2017-07-26 NOTE — Progress Notes (Signed)
@Patient  ID: Samantha Dyer, female    DOB: 09-10-1950, 66 y.o.   MRN: [id number]  Chief Complaint  Patient presents with  . Follow-up    COPD     Referring provider: Marinda Elk, MD  HPI: 66/F smoker for FU of gold stg 4 COPD, Hypercarbic/Hypoxic RF on o2 2l/m   Adm 06/2010 for an exacerbation treated wih avelox & steroids, prednisone has caused 'psychosis' in the past. Discharged on oxygen.   Significant tests/ events 2011Spirometry severe airway obstruction with FEV1 of 30% c/w Gold stg IV copd. Admission 06/2017 >VDRF>Echo 05/2017 EF 40-45%, CABG x4 , A Fib w/ RVR ,  Admitted 06/2017 >Hyponatremia, Pleural effusion s/p thoracentesis .   07/26/2017 Follow up : COPD , Hypercarbic/Hypoxic RF on 2l/m , Smoker , Pleural Effusion  Patient returns for a one-month follow-up.  She is accompanied by her husband.  Patient has known very severe COPD and is oxygen dependent.  She was seen last visit for a post hospital follow-up after recent admission for respiratory failure, non-STEMI and CABG. patient was having difficulty with volume overload and was on aggressive diuresis.  Chest x-ray last visit showed a Right moderate pleural effusion.  Patient had progressive weakness and was found to have severe hyponatremia by primary care physician.  And was admitted on November 01/2017.  Sodium was 116 on admission potassium 2.4 and bicarb at 49.  Patient had been on Zaroxolyn and Lasix..  Patient underwent a right thoracentesis with hot 500 cc removed.  Consistent with a exudative process. She has Pneumothorax ex vacuo .  Cultures were negative.  Cytology was negative for malignant cells.  Patient was seen by thoracic surgery Dr. Dorris Fetch for. She declined pigtail catheter .  Since discharge patient feels weak no energy and has balance issues persist.  She says her breathing is doing about the same she has had no increased shortness of breath cough or hemoptysis.  Patient was seen by her primary  care physician yesterday records are unavailable.  Chest x-ray was reviewed and showed a moderate right pleural effusion.  Pneumothorax has resolved.  Patient has an appointment with thoracic surgery on December 10.  Patient has appointment later this afternoon with primary care physician as she says her sodium is low again.  She denies any seizure activity, vomiting, or fevers.  She is no longer taking Zaroxolyn but says she is taking Lasix 40 mg daily. She remains on BREO and Spiriva. Still smoking , cessation discussed.      Allergies  Allergen Reactions  . Amoxicillin-Pot Clavulanate Swelling  . Augmentin [Amoxicillin-Pot Clavulanate] Swelling    Immunization History  Administered Date(s) Administered  . Influenza Split 05/21/2013, 05/05/2014, 06/04/2016  . Influenza Whole 06/21/2010  . Influenza, High Dose Seasonal PF 06/28/2017  . Pneumococcal Polysaccharide-23 07/04/2010    Past Medical History:  Diagnosis Date  . Allergic rhinitis, cause unspecified   . Anemia, unspecified   . Backache, unspecified   . CAD (coronary artery disease)    a. cath on 06/01/17 showing severe 3-vessel CAD --> s/p CABG on 06/06/2017 with LIMA-LAD, SVG-PDA, and Seq SVG-RI1-RI2.  Marland Kitchen COPD (chronic obstructive pulmonary disease) (HCC)   . Esophageal reflux   . Iron deficiency anemia, unspecified     Tobacco History: Social History   Tobacco Use  Smoking Status Current Every Day Smoker  . Packs/day: 1.00  . Years: 43.00  . Pack years: 43.00  . Types: Cigarettes  Smokeless Tobacco Never Used   Ready to  quit: No Counseling given: Yes   Outpatient Encounter Medications as of 07/26/2017  Medication Sig  . acetaminophen (TYLENOL) 325 MG tablet Take 650 mg by mouth every 6 (six) hours as needed for mild pain.  Marland Kitchen amiodarone (PACERONE) 400 MG tablet Take 1 tablet (400 mg total) daily by mouth.  Marland Kitchen aspirin EC 81 MG EC tablet Take 1 tablet (81 mg total) by mouth daily.  Marland Kitchen atorvastatin (LIPITOR) 80  MG tablet Take 1 tablet (80 mg total) by mouth daily at 6 PM.  . COMBIVENT RESPIMAT 20-100 MCG/ACT AERS respimat Inhale 1 puff 4 (four) times daily as needed into the lungs for wheezing or shortness of breath.  . ferrous gluconate (FERGON) 324 MG tablet Take 1 tablet (324 mg total) by mouth 2 (two) times daily with a meal.  . fluticasone furoate-vilanterol (BREO ELLIPTA) 200-25 MCG/INH AEPB Inhale 1 puff into the lungs daily.  . furosemide (LASIX) 80 MG tablet Take 0.5 tablets (40 mg total) 2 (two) times daily by mouth.  . metolazone (ZAROXOLYN) 5 MG tablet Take 5 mg daily by mouth.  . metoprolol tartrate (LOPRESSOR) 25 MG tablet Take 1 tablet (25 mg total) by mouth 2 (two) times daily.  . OXYGEN Inhale 2 L into the lungs continuous.  . potassium chloride SA (K-DUR,KLOR-CON) 20 MEQ tablet Take 1 tablet (20 mEq total) daily by mouth.  . tiotropium (SPIRIVA HANDIHALER) 18 MCG inhalation capsule inhale contents of one capsule once daily  . traMADol (ULTRAM) 50 MG tablet Take 1 tablet (50 mg total) by mouth every 4 (four) hours as needed for moderate pain.   No facility-administered encounter medications on file as of 07/26/2017.      Review of Systems  Constitutional:   No  weight loss, night sweats,  Fevers, chills,  +fatigue, or  lassitude.  HEENT:   No headaches,  Difficulty swallowing,  Tooth/dental problems, or  Sore throat,                No sneezing, itching, ear ache, nasal congestion, post nasal drip,   CV:  No chest pain,  Orthopnea, PND,  +swelling in lower extremities, anasarca, dizziness, palpitations, syncope.   GI  No heartburn, indigestion, abdominal pain, nausea, vomiting, diarrhea, change in bowel habits, loss of appetite, bloody stools.   Resp: No chest wall deformity  Skin: no rash or lesions.  GU: no dysuria, change in color of urine, no urgency or frequency.  No flank pain, no hematuria   MS:  No joint pain or swelling.  No decreased range of motion.  No back  pain.    Physical Exam  BP 105/73 (BP Location: Left Arm, Patient Position: Sitting, Cuff Size: Normal)   Pulse 74   Ht 5\' 4"  (1.626 m)   SpO2 96%   BMI 22.11 kg/m   GEN: A/Ox3; pleasant , NAD, frail and elderly in wheelchair   HEENT:  [place]/AT,  EACs-clear, TMs-wnl, NOSE-clear, THROAT-clear, no lesions, no postnasal drip or exudate noted.   NECK:  Supple w/ fair ROM; no JVD; normal carotid impulses w/o bruits; no thyromegaly or nodules palpated; no lymphadenopathy.    RESP  Clear  P & A; w/o, wheezes/ rales/ or rhonchi. no accessory muscle use, no dullness to percussion  CARD:  RRR, no m/r/g   1-2+ peripheral edema, pulses intact, no cyanosis or clubbing.  GI:   Soft & nt; nml bowel sounds; no organomegaly or masses detected.   [place]co: Warm bil, no deformities or joint swelling  noted.   Neuro: alert, no focal deficits noted.    Skin: Warm, no lesions or rashes    Lab Results:   CBC   BNP  Imaging: Ct Chest Wo Contrast  Result Date: 06/27/2017 CLINICAL DATA:  Status post thoracentesis with pressure dependent pleuroparenchymal fistula. No pneumothorax. EXAM: CT CHEST WITHOUT CONTRAST TECHNIQUE: Multidetector CT imaging of the chest was performed following the standard protocol without IV contrast. COMPARISON:  None. FINDINGS: Cardiovascular: Stable cardiomegaly with small pericardial effusion or thickening. Left main and three-vessel dense coronary arteriosclerosis is noted with aortic atherosclerosis and atherosclerosis of the proximal great vessels. Patient is status post CABG. Shallow left lateral aneurysmal bulging of the anterior aortic arch, measuring 0.5 cm in depth by 1.5 cm in length, unchanged in appearance. Mediastinum/Nodes: No esophageal abnormality. No mediastinal shift. Patent trachea and mainstem bronchi. Lungs/Pleura: The patient has undergone thoracentesis of moderate to large right pleural effusion. Residual 1.7 cm in thickness right and 1.4 cm in thickness  left small bilateral pleural effusions are noted with incomplete re-expansion of right middle and lower lobes. Findings likely represent a pneumothorax ex vacuo at the right lung base from evacuation of chronic pleural effusion. The ex vacuo pneumothorax measures 2.4 cm in thickness along the anterior aspect of the right upper lobe cannot 6.8 cm in thickness on coronal reformats at the right lung base. Compressive atelectasis is seen at the left lung base. Upper Abdomen: No acute abnormality. Musculoskeletal: No acute nor suspicious osseous abnormalities. IMPRESSION: 1. Status post thoracentesis of moderate right pleural effusion from the right lung base. 2. There is resultant incomplete re-expansion of the right middle and lower lobes. Given this appearance, findings likely represent a pneumothorax ex vacuo. No mediastinal shift is noted. 3. There are small bilateral pleural effusions as above described measuring up to 1.7 cm in thickness on the right and 1.4 cm in thickness on the left, both at the lung bases posteriorly. 4. Cardiomegaly with trace pericardial effusion, stable in appearance with post CABG change. 5. Moderate aortic atherosclerosis and atherosclerosis of the great vessels. Slight left lateral aneurysmal bulging of the aortic arch measuring 1.5 cm in length by 0.5 cm in depth also stable. Aortic Atherosclerosis (ICD10-I70.0). Electronically Signed   By: Tollie Ethavid  Kwon M.D.   On: 06/27/2017 20:15   Ct Angio Chest Pe W And/or Wo Contrast  Result Date: 06/26/2017 CLINICAL DATA:  Shortness of breath for 1 week, recent CABG on 06/05/2017, lower extremity swelling, question pulmonary embolism with high pretest probability, history COPD, coronary disease, smoker EXAM: CT ANGIOGRAPHY CHEST WITH CONTRAST TECHNIQUE: Multidetector CT imaging of the chest was performed using the standard protocol during bolus administration of intravenous contrast. Multiplanar CT image reconstructions and MIPs were obtained to  evaluate the vascular anatomy. CONTRAST:  100 cc Isovue 370 IV COMPARISON:  09/05/2015 FINDINGS: Cardiovascular: Atherosclerotic calcifications aorta, coronary arteries and proximal great vessels. Aorta normal caliber without aneurysm or dissection. Minimal pericardial effusion. Pulmonary arteries well opacified and patent. No evidence pulmonary embolism. Mediastinum/Nodes: No esophageal abnormalities. Prior median sternotomy. Minimal stranding in the anterior mediastinum consistent recent surgery. Base of cervical region unremarkable. No thoracic adenopathy. Lungs/Pleura: BILATERAL pleural effusions small on LEFT and moderate on RIGHT. Compressive atelectasis of the lower lobes. Subsegmental atelectasis RIGHT middle lobe. Additional mild atelectasis along RIGHT major fissure. Center peribronchial thickening. No pulmonary infiltrate or pneumothorax. Upper Abdomen: Visualized upper abdomen unremarkable Musculoskeletal: No acute osseous findings. Review of the MIP images confirms the above findings. IMPRESSION: No evidence  of pulmonary embolism. BILATERAL pleural effusions and compressive atelectasis of the lower lobes RIGHT greater than LEFT. Additional bronchitic changes with subsegmental atelectasis at RIGHT middle lobe. Minimal pericardial effusion and postsurgical changes of the mediastinum. Aortic Atherosclerosis (ICD10-I70.0). Electronically Signed   By: Ulyses Southward M.D.   On: 06/26/2017 14:03   Dg Chest Port 1 View  Result Date: 07/04/2017 CLINICAL DATA:  Respiratory failure EXAM: PORTABLE CHEST 1 VIEW COMPARISON:  July 03, 2017 FINDINGS: Stable hydropneumothorax on the right without tension component. There is a small pleural effusion on the left as well. There is patchy consolidation in both lung bases, stable. There is no evident new opacity. Heart is upper normal in size with pulmonary vascularity within normal limits. There is aortic atherosclerosis. No bone lesions. There is carotid artery  calcification on the left. IMPRESSION: Stable hydropneumothorax on the right without tension component. Small left pleural effusion. Bibasilar patchy consolidation. Stable cardiac silhouette. Aortic atherosclerosis as well as calcification in the left carotid artery noted. Aortic Atherosclerosis (ICD10-I70.0). Electronically Signed   By: Bretta Bang III M.D.   On: 07/04/2017 07:30   Dg Chest Port 1 View  Result Date: 07/03/2017 CLINICAL DATA:  Shortness of breath. Known right-sided hydropneumothorax. EXAM: PORTABLE CHEST 1 VIEW COMPARISON:  Chest x-ray of July 02, 2017. FINDINGS: The remains a right-sided hydropneumothorax. The pleural line remains between the fourth and fifth posterior right rib interfaces. On the left there is a small pleural effusion but no pneumothorax. There is no mediastinal shift. Bibasilar densities persist consistent with atelectasis or pneumonia. The heart border is largely obscured. There is prominence of the central pulmonary vascularity. There are post CABG changes. There is calcification in the wall of the aortic arch. IMPRESSION: Persistent right-sided hydropneumothorax and small left pleural effusion. Bibasilar atelectasis or pneumonia. There has not been significant change since yesterday's study. Cardiomegaly with central pulmonary vascular congestion, stable. Previous CABG. Thoracic aortic atherosclerosis. Electronically Signed   By: David  [place] M.D.   On: 07/03/2017 07:58   Dg Chest Port 1 View  Result Date: 07/02/2017 CLINICAL DATA:  66 year old female with pneumothorax. Prior thoracentesis. Subsequent encounter. EXAM: PORTABLE CHEST 1 VIEW COMPARISON:  06/30/2017 chest x-ray. FINDINGS: Moderately large right-sided hydropneumothorax. This may represent pneumothorax ex vacuo with minimal decrease in size of pneumothorax component although increase in size of pleural effusion component. Consolidation right mid lower lobe may represent atelectasis or  infiltrate. Underlying mass not excluded. Mild pulmonary vascular prominence. Small left-sided pleural effusion/ left base atelectasis. Post CABG.  Cardiomegaly. Calcified aorta. IMPRESSION: Moderately large right-sided hydropneumothorax. This may represent pneumothorax ex vacuo with minimal decrease in size of pneumothorax component although increase in size of pleural effusion component. Consolidation right mid lower lobe may represent atelectasis or infiltrate. Mild pulmonary vascular prominence. Small left-sided pleural effusion/ left base atelectasis. Cardiomegaly post CABG. Aortic Atherosclerosis (ICD10-I70.0). Electronically Signed   By: Lacy Duverney M.D.   On: 07/02/2017 07:36   Dg Chest Port 1 View  Result Date: 06/30/2017 CLINICAL DATA:  Followup pneumothorax. EXAM: PORTABLE CHEST 1 VIEW COMPARISON:  06/29/2017 FINDINGS: Mild interval decrease in size in the right pneumothorax, which remains moderate in overall size. Persistent lung base opacity reflecting bilateral effusions with atelectasis. No left pneumothorax. Stable changes from prior CABG surgery. IMPRESSION: 1. Moderate-sized right pneumothorax has mildly decreased in size from the previous day's exam. 2. No other change. Persistent lung base opacity consistent with bilateral pleural effusions and lung base atelectasis. Electronically Signed   By: Onalee Hua  Ormond M.D.   On: 06/30/2017 07:42   Dg Chest Port 1 View  Result Date: 06/29/2017 CLINICAL DATA:  Pneumothorax on the right EXAM: PORTABLE CHEST 1 VIEW COMPARISON:  Yesterday FINDINGS: Right hydropneumothorax. There is no convincing change in the gas component when counting ribs spaces and when accounting for displacement of gas at the base. There is no mediastinal shift or diaphragm depression. Left lung is clear. Cardiomegaly. Status post CABG. IMPRESSION: Approximately 50% right pneumothorax without convincing change from yesterday. Stable layering right pleural effusion.  Electronically Signed   By: Marnee SpringJonathon  Watts M.D.   On: 06/29/2017 06:46   Dg Chest Port 1 View  Result Date: 06/28/2017 CLINICAL DATA:  Pneumothorax, followup EXAM: PORTABLE CHEST 1 VIEW COMPARISON:  Portable chest x-ray of 06/28/2017 earlier in the day FINDINGS: There is little change in volume of the right pneumothorax which is at least 50%. Right pleural effusion again is noted as well. Some opacity is present medially at the left lung base which could be due to atelectasis but pneumonia cannot be excluded. Mediastinal and hilar contours are unremarkable and mild cardiomegaly is stable. Median sternotomy sutures are noted IMPRESSION: 1. Little change in approximately 50% right pneumothorax with right pleural effusion. 2. No change in opacity medially at the left lung base. Atelectasis or pneumonia. Electronically Signed   By: Dwyane DeePaul  Barry M.D.   On: 06/28/2017 17:19   Dg Chest Port 1 View  Result Date: 06/28/2017 CLINICAL DATA:  66 year old female with pneumothorax. EXAM: PORTABLE CHEST 1 VIEW COMPARISON:  X-ray and CT dated 06/27/2017. FINDINGS: Median sternotomy wires again noted. The cardiomediastinal silhouette is unchanged in size and morphology. Moderate to large right apical pneumothorax is again noted. There is an enlarging right basilar pleural effusion small left basilar effusion and associated atelectasis also identified. Superimposed consolidation difficult to exclude in this setting. No left-sided pneumothorax. No acute osseous abnormalities. IMPRESSION: 1. Continued moderate to large right apical pneumothorax with developing right basilar effusion and atelectasis, with or without superimposed consolidation. 2. Small left basilar effusion and associated atelectasis. Again, superimposed consolidation difficult to exclude. Electronically Signed   By: Sande BrothersSerena  Chacko M.D.   On: 06/28/2017 07:18   Dg Chest Portable 1 View  Result Date: 06/27/2017 CLINICAL DATA:  Status post thoracentesis. EXAM:  PORTABLE CHEST 1 VIEW COMPARISON:  06/26/2017 CT and prior studies FINDINGS: Cardiomegaly and CABG changes noted. A moderate to large right pneumothorax is noted. Given CT findings, this may be secondary to unexpandable mid-lower lung. Apical pneumothorax could be be related to introduce air as no upper lung pleural thickening or abnormality is identified on CT. Right lower lung consolidation/atelectasis noted. Left basilar atelectasis again identified. IMPRESSION: Moderate to large right pneumothorax which may be in part due to unexpandable lung. Cardiomegaly, right lower lung consolidation/ atelectasis and left basilar atelectasis again noted. Critical Value/emergent results were called by telephone at the time of interpretation on 06/27/2017 at 4:01 pm to [facility], nurse for this [facility] , who verbally acknowledged these results. Electronically Signed   By: Harmon PierJeffrey  Hu M.D.   On: 06/27/2017 16:01     Assessment & Plan:   C O P D GOLD IV COPD on O2 - does not appear to be having an exacerbation but remains very weak with multiple co-morbidities .  Continue on BREO and Spiriva daily  Cont w/ diuresis per cards .  F/up w/ PCP for ongoing hyponatremia issues.  Needs smoking cessation  Will need to address pleural effusion with  TCS   Plan  Patient Instructions  Continue on BREO and Spiriva  Continue on oxygen 2l/m  MUST STOP SMOKING   Follow up with Primary Care doctor today as planned today .  Follow up Dr. Kendrick Fries in 1 month and As needed   Follow up Dr. Dorris Fetch next week as planned and As needed  (if it canceled by snow , please call and have it rescheduled.)  Please contact office for sooner follow up if symptoms do not improve or worsen or seek emergency care      Chronic respiratory failure (HCC) Cont on O2 .  O2 sats at rest 81% on room air , O2 sats on 2l/m 96% .   Plan  Cont on O2.   Pneumothorax Resolved on chest xray on 07/24/17 .  Cont. follow up with  TCS   Hyponatremia Follow up PCP .   Pleural effusion Right sided Pleural effusion s/p thoracentesis on 06/27/17 neg cx/cytolgy , c/w exudative process.  Recent CABG  - pt is following with TCS on 07/30/17 will need to decided if thoracentesis is indicated.       Rubye Oaks, NP 07/26/2017

## 2017-07-26 NOTE — ED Provider Notes (Signed)
[facility]-EMERGENCY DEPT Provider Note   CSN: [id number] Arrival date & time: 07/26/17  1931     History   Chief Complaint Chief Complaint  Patient presents with  . Shortness of Breath    Hyponatremia     HPI Samantha Dyer is a 66 y.o. female.  43 y oF with a chief complaint of shortness of breath.  The patient has chronic shortness of breath due to her COPD.  She went to her family doctor's office labs checked today.  She was called back and told that her sodium was low and that she needed to go to the hospital immediately for admission.  She denies any change in her mental status.  Denies seizures.  Denies confusion.  She is mildly tired during my history which she says is normal for her.   The history is provided by the patient.  Shortness of Breath  This is a chronic problem. The average episode lasts 2 weeks. The problem occurs continuously.The current episode started 2 days ago. The problem has been gradually worsening. Pertinent negatives include no fever, no headaches, no rhinorrhea, no wheezing, no chest pain and no vomiting. She has tried nothing for the symptoms. The treatment provided no relief. She has had no prior hospitalizations. She has had no prior ED visits.    Past Medical History:  Diagnosis Date  . Allergic rhinitis, cause unspecified   . Anemia, unspecified   . Backache, unspecified   . CAD (coronary artery disease)    a. cath on 06/01/17 showing severe 3-vessel CAD --> s/p CABG on 06/06/2017 with LIMA-LAD, SVG-PDA, and Seq SVG-RI1-RI2.  Marland Kitchen COPD (chronic obstructive pulmonary disease) (HCC)   . Esophageal reflux   . Iron deficiency anemia, unspecified     Patient Active Problem List   Diagnosis Date Noted  . Pleural effusion 07/26/2017  . Pneumothorax   . CAD (coronary artery disease) 06/25/2017  . S/P CABG x 4 06/06/2017  . Ischemic cardiomyopathy   . Hypokalemia   . Pure hyperglyceridemia   . Non-ST elevation (NSTEMI)  myocardial infarction (HCC)   . Systolic congestive heart failure (HCC)   . Acute on chronic respiratory failure with hypoxia and hypercapnia (HCC) 05/29/2017  . COPD exacerbation (HCC)   . Acute respiratory failure with hypoxia and hypercapnia (HCC)   . Acute diastolic (congestive) heart failure (HCC)   . Hyponatremia   . Acute on chronic respiratory failure (HCC) 09/12/2016  . Acute respiratory failure with hypoxemia (HCC) 07/10/2016  . Hypomagnesemia 07/08/2016  . Hyponatremia 07/07/2016  . Acute respiratory failure with hypoxia (HCC) 07/07/2016  . COPD (chronic obstructive pulmonary disease) (HCC) 07/07/2016  . COPD exacerbation (HCC) 08/28/2015  . TOBACCO ABUSE 07/26/2010  . Chronic respiratory failure (HCC) 07/26/2010  . UNSPECIFIED IRON DEFICIENCY ANEMIA 07/25/2010  . UNSPECIFIED ANEMIA 07/25/2010  . ALLERGIC RHINITIS CAUSE UNSPECIFIED 07/25/2010  . C O P D 07/25/2010  . Esophageal reflux 07/25/2010  . UNSPECIFIED BACKACHE 07/25/2010    Past Surgical History:  Procedure Laterality Date  . CORONARY ARTERY BYPASS GRAFT N/A 06/06/2017   Procedure: CORONARY ARTERY BYPASS GRAFTING x 4 USING LEFT INTERNAL MAMMARY ARTERY AND RIGHT GREATER SAPHENOUS VEIN HARVESTED ENDOSCOPICALLY -LIMA to LAD -SVG to PDA -SEQ SVG to RAMUS 1 and RAMUS 2;  Surgeon: Loreli Slot, MD;  Location: [facility] OR;  Service: Open Heart Surgery;  Laterality: N/A;  . RIGHT/LEFT HEART CATH AND CORONARY ANGIOGRAPHY N/A 06/01/2017   Procedure: RIGHT/LEFT HEART CATH AND CORONARY ANGIOGRAPHY;  Surgeon: Corky CraftsVaranasi, Jayadeep S, MD;  [facility];  Service: Cardiovascular;  Laterality: N/A;  . TEE WITHOUT CARDIOVERSION N/A 06/06/2017   Procedure: TRANSESOPHAGEAL ECHOCARDIOGRAM (TEE);  Surgeon: Loreli SlotHendrickson, Steven C, MD;  [facility];  Service: Open Heart Surgery;  Laterality: N/A;    OB History    No data available       Home Medications    Prior to Admission medications   Medication Sig  Start Date End Date Taking? Authorizing Provider  acetaminophen (TYLENOL) 325 MG tablet Take 650 mg by mouth every 6 (six) hours as needed for mild pain.   Yes [provider]  amiodarone (PACERONE) 400 MG tablet Take 1 tablet (400 mg total) daily by mouth. 06/25/17  Yes Strader, Lennart PallBrittany M, PA-C  aspirin EC 81 MG EC tablet Take 1 tablet (81 mg total) by mouth daily. 06/14/17  Yes Barrett, Erin R, PA-C  atorvastatin (LIPITOR) 80 MG tablet Take 1 tablet (80 mg total) by mouth daily at 6 PM. 06/14/17  Yes Barrett, Erin R, PA-C  COMBIVENT RESPIMAT 20-100 MCG/ACT AERS respimat Inhale 1 puff 4 (four) times daily as needed into the lungs for wheezing or shortness of breath. 07/05/17  Yes Lonia BloodMcClung, Jeffrey T, MD  ferrous gluconate (FERGON) 324 MG tablet Take 1 tablet (324 mg total) by mouth 2 (two) times daily with a meal. 06/14/17  Yes Barrett, Erin R, PA-C  fluticasone furoate-vilanterol (BREO ELLIPTA) 200-25 MCG/INH AEPB Inhale 1 puff into the lungs daily. 09/21/16  Yes Parrett, Virgel Bouquetammy S, NP  furosemide (LASIX) 80 MG tablet Take 0.5 tablets (40 mg total) 2 (two) times daily by mouth. 07/05/17  Yes McClung, Elpidio EricJeffrey T, MD  metolazone (ZAROXOLYN) 5 MG tablet Take 5 mg daily by mouth.   Yes [provider]  metoprolol tartrate (LOPRESSOR) 25 MG tablet Take 1 tablet (25 mg total) by mouth 2 (two) times daily. 06/14/17  Yes Barrett, Erin R, PA-C  OXYGEN Inhale 2 L into the lungs continuous.   Yes [provider]  potassium chloride SA (K-DUR,KLOR-CON) 20 MEQ tablet Take 1 tablet (20 mEq total) daily by mouth. Patient taking differently: Take 10 mEq by mouth daily.  07/05/17  Yes Lonia BloodMcClung, Jeffrey T, MD  tiotropium (SPIRIVA HANDIHALER) 18 MCG inhalation capsule inhale contents of one capsule once daily 03/15/16  Yes [provider]  traMADol (ULTRAM) 50 MG tablet Take 1 tablet (50 mg total) by mouth every 4 (four) hours as needed for moderate pain. Patient not taking: Reported on  07/26/2017 06/13/17   Barrett, Rae RoamErin R, PA-C    Family History Family History  Problem Relation Age of Onset  . Emphysema Mother   . Heart disease Mother   . Breast cancer Mother   . Pancreatic cancer Father     Social History Social History   Tobacco Use  . Smoking status: Current Every Day Smoker    Packs/day: 1.00    Years: 43.00    Pack years: 43.00    Types: Cigarettes  . Smokeless tobacco: Never Used  Substance Use Topics  . Alcohol use: No  . Drug use: No     Allergies   Amoxicillin-pot clavulanate and Augmentin [amoxicillin-pot clavulanate]   Review of Systems Review of Systems  Constitutional: Negative for chills and fever.  HENT: Negative for congestion and rhinorrhea.   Eyes: Negative for redness and visual disturbance.  Respiratory: Positive for shortness of breath. Negative for wheezing.   Cardiovascular: Negative for chest pain and palpitations.  Gastrointestinal: Negative for nausea and vomiting.  Genitourinary: Negative for dysuria and urgency.  Musculoskeletal: Negative for arthralgias and myalgias.  Skin: Negative for pallor and wound.  Neurological: Negative for dizziness and headaches.     Physical Exam Updated Vital Signs BP (!) 153/62 (BP Location: Right Arm)   Pulse 79   Temp 97.8 F (36.6 C) (Oral)   Resp 18   SpO2 100%   Physical Exam  Constitutional: She is oriented to person, place, and time. She appears well-developed and well-nourished. No distress.  HENT:  Head: Normocephalic and atraumatic.  Eyes: EOM are normal. Pupils are equal, round, and reactive to light.  Neck: Normal range of motion. Neck supple.  Cardiovascular: Normal rate and regular rhythm. Exam reveals no gallop and no friction rub.  No murmur heard. Pulmonary/Chest: Effort normal. She has no wheezes. She has rales in the right lower field and the left lower field.  Abdominal: Soft. She exhibits no distension. There is no tenderness.  Musculoskeletal: She  exhibits no tenderness.       Right lower leg: She exhibits edema (3+ to the knees).       Left lower leg: She exhibits edema.  Neurological: She is alert and oriented to person, place, and time.  Skin: Skin is warm and dry. She is not diaphoretic.  Psychiatric: She has a normal mood and affect. Her behavior is normal.  Nursing note and vitals reviewed.    ED Treatments / Results  Labs (all labs ordered are listed, but only abnormal results are displayed) Labs Reviewed  CBC WITH DIFFERENTIAL/PLATELET - Abnormal; Notable for the following components:      Result Value   RBC 3.46 (*)    Hemoglobin 10.1 (*)    HCT 30.1 (*)    Lymphs Abs 0.3 (*)    All other components within normal limits  BASIC METABOLIC PANEL - Abnormal; Notable for the following components:   Sodium 120 (*)    Chloride 66 (*)    CO2 44 (*)    Glucose, Bld 119 (*)    Calcium 8.5 (*)    All other components within normal limits  BRAIN NATRIURETIC PEPTIDE - Abnormal; Notable for the following components:   B Natriuretic Peptide 838.8 (*)    All other components within normal limits  BLOOD GAS, VENOUS - Abnormal; Notable for the following components:   pCO2, Ven 68.7 (*)    Bicarbonate 41.8 (*)    Acid-Base Excess 14.7 (*)    All other components within normal limits  I-STAT TROPONIN, ED  I-STAT CHEM 8, ED    EKG  EKG Interpretation None       Radiology Dg Chest 2 View  Result Date: 07/26/2017 CLINICAL DATA:  Shortness of breath for 1 month EXAM: CHEST  2 VIEW COMPARISON:  07/24/2017 FINDINGS: Cardiac shadow is stable. Postoperative changes are again seen. Right-sided pleural effusion is noted slightly greater than that seen on the prior exam. Mild vascular congestion is noted new from the prior study. No other focal abnormality is seen. IMPRESSION: Slight increase in right-sided pleural effusion. Vascular congestion. Electronically Signed   By: Alcide Clever M.D.   On: 07/26/2017 21:00     Procedures Procedures (including critical care time)  Medications Ordered in ED Medications - No data to display   Initial Impression / Assessment and Plan / ED Course  I have reviewed the triage vital signs and the nursing notes.  Pertinent labs & imaging results that were available  during my care of the patient were reviewed by me and considered in my medical decision making (see chart for details).     66 yo F with a chief complaint of with a chief complaint of shortness of breath.  The patient went to her family doctor's office and was found to have a low sodium and was sent here to be admitted.  My exam the patient appears fluid overloaded.  She denies history of heart failure, though her most recent discharge summary says otherwise.  The patient's sodium is 120.  This is just below her baseline.  I discussed the case with the hospitalist, he will come evaluate the patient and likely place her in observation.   CRITICAL CARE Performed by: Rae Roamaniel Patrick Nygel Prokop   Total critical care time: 35 minutes  Critical care time was exclusive of separately billable procedures and treating other patients.  Critical care was necessary to treat or prevent imminent or life-threatening deterioration.  Critical care was time spent personally by me on the following activities: development of treatment plan with patient and/or surrogate as well as nursing, discussions with consultants, evaluation of patient's response to treatment, examination of patient, obtaining history from patient or surrogate, ordering and performing treatments and interventions, ordering and review of laboratory studies, ordering and review of radiographic studies, pulse oximetry and re-evaluation of patient's condition.  The patients results and plan were reviewed and discussed.   Any x-rays performed were independently reviewed by myself.   Differential diagnosis were considered with the presenting  HPI.  Medications - No data to display  Vitals:   07/26/17 2004 07/26/17 2057  BP: (!) 153/62 (!) 153/62  Pulse:  79  Resp: 20 18  Temp: 97.8 F (36.6 C)   TempSrc: Oral   SpO2: 96% 100%    Final diagnoses:  Hyponatremia  Acute on chronic diastolic congestive heart failure (HCC)    Admission/ observation were discussed with the admitting physician, patient and/or family and they are comfortable with the plan.    Final Clinical Impressions(s) / ED Diagnoses   Final diagnoses:  Hyponatremia  Acute on chronic diastolic congestive heart failure [facility](HCC)    ED Discharge Orders    None       Melene PlanFloyd, Lulabelle Desta, DO 07/26/17 2152

## 2017-07-26 NOTE — ED Triage Notes (Signed)
Patient here from home sent by PCP for hyponatremia. SOB hx of COPD oxygen dependent 2L. Currently on diuretics. Hx of CABG and recent thoracentesis.

## 2017-07-26 NOTE — Assessment & Plan Note (Signed)
Follow-up PCP

## 2017-07-26 NOTE — Assessment & Plan Note (Signed)
Resolved on chest xray on 07/24/17 .  Cont. follow up with [facility]

## 2017-07-26 NOTE — Assessment & Plan Note (Signed)
Cont on O2 .  O2 sats at rest 81% on room air , O2 sats on 2l/m 96% .   Plan  Cont on O2.

## 2017-07-26 NOTE — Progress Notes (Signed)
Reviewed, agree 

## 2017-07-27 ENCOUNTER — Other Ambulatory Visit: Payer: Self-pay

## 2017-07-27 ENCOUNTER — Other Ambulatory Visit: Payer: Self-pay | Admitting: Thoracic Surgery (Cardiothoracic Vascular Surgery)

## 2017-07-27 DIAGNOSIS — E874 Mixed disorder of acid-base balance: Secondary | ICD-10-CM | POA: Diagnosis present

## 2017-07-27 DIAGNOSIS — Z9981 Dependence on supplemental oxygen: Secondary | ICD-10-CM | POA: Diagnosis not present

## 2017-07-27 DIAGNOSIS — J9622 Acute and chronic respiratory failure with hypercapnia: Secondary | ICD-10-CM | POA: Diagnosis not present

## 2017-07-27 DIAGNOSIS — J9621 Acute and chronic respiratory failure with hypoxia: Secondary | ICD-10-CM | POA: Diagnosis not present

## 2017-07-27 DIAGNOSIS — E785 Hyperlipidemia, unspecified: Secondary | ICD-10-CM | POA: Diagnosis present

## 2017-07-27 DIAGNOSIS — R402413 Glasgow coma scale score 13-15, at hospital admission: Secondary | ICD-10-CM | POA: Diagnosis present

## 2017-07-27 DIAGNOSIS — E869 Volume depletion, unspecified: Secondary | ICD-10-CM | POA: Diagnosis present

## 2017-07-27 DIAGNOSIS — E871 Hypo-osmolality and hyponatremia: Secondary | ICD-10-CM | POA: Diagnosis not present

## 2017-07-27 DIAGNOSIS — R339 Retention of urine, unspecified: Secondary | ICD-10-CM | POA: Diagnosis not present

## 2017-07-27 DIAGNOSIS — T501X5A Adverse effect of loop [high-ceiling] diuretics, initial encounter: Secondary | ICD-10-CM | POA: Diagnosis not present

## 2017-07-27 DIAGNOSIS — Z7189 Other specified counseling: Secondary | ICD-10-CM | POA: Diagnosis not present

## 2017-07-27 DIAGNOSIS — Z66 Do not resuscitate: Secondary | ICD-10-CM | POA: Diagnosis not present

## 2017-07-27 DIAGNOSIS — I25119 Atherosclerotic heart disease of native coronary artery with unspecified angina pectoris: Secondary | ICD-10-CM | POA: Diagnosis not present

## 2017-07-27 DIAGNOSIS — J449 Chronic obstructive pulmonary disease, unspecified: Secondary | ICD-10-CM | POA: Diagnosis not present

## 2017-07-27 DIAGNOSIS — M549 Dorsalgia, unspecified: Secondary | ICD-10-CM | POA: Diagnosis present

## 2017-07-27 DIAGNOSIS — D509 Iron deficiency anemia, unspecified: Secondary | ICD-10-CM | POA: Diagnosis present

## 2017-07-27 DIAGNOSIS — J90 Pleural effusion, not elsewhere classified: Secondary | ICD-10-CM | POA: Diagnosis not present

## 2017-07-27 DIAGNOSIS — I5033 Acute on chronic diastolic (congestive) heart failure: Secondary | ICD-10-CM | POA: Diagnosis not present

## 2017-07-27 DIAGNOSIS — I480 Paroxysmal atrial fibrillation: Secondary | ICD-10-CM | POA: Diagnosis present

## 2017-07-27 DIAGNOSIS — I5030 Unspecified diastolic (congestive) heart failure: Secondary | ICD-10-CM | POA: Diagnosis not present

## 2017-07-27 DIAGNOSIS — Z515 Encounter for palliative care: Secondary | ICD-10-CM | POA: Diagnosis not present

## 2017-07-27 DIAGNOSIS — E876 Hypokalemia: Secondary | ICD-10-CM | POA: Diagnosis not present

## 2017-07-27 DIAGNOSIS — J9620 Acute and chronic respiratory failure, unspecified whether with hypoxia or hypercapnia: Secondary | ICD-10-CM | POA: Diagnosis not present

## 2017-07-27 DIAGNOSIS — K219 Gastro-esophageal reflux disease without esophagitis: Secondary | ICD-10-CM | POA: Diagnosis not present

## 2017-07-27 DIAGNOSIS — R338 Other retention of urine: Secondary | ICD-10-CM | POA: Diagnosis not present

## 2017-07-27 DIAGNOSIS — D638 Anemia in other chronic diseases classified elsewhere: Secondary | ICD-10-CM | POA: Diagnosis not present

## 2017-07-27 DIAGNOSIS — F17210 Nicotine dependence, cigarettes, uncomplicated: Secondary | ICD-10-CM | POA: Diagnosis present

## 2017-07-27 DIAGNOSIS — Z951 Presence of aortocoronary bypass graft: Secondary | ICD-10-CM | POA: Diagnosis not present

## 2017-07-27 DIAGNOSIS — I5031 Acute diastolic (congestive) heart failure: Secondary | ICD-10-CM | POA: Diagnosis not present

## 2017-07-27 DIAGNOSIS — L8990 Pressure ulcer of unspecified site, unspecified stage: Secondary | ICD-10-CM | POA: Diagnosis present

## 2017-07-27 DIAGNOSIS — E222 Syndrome of inappropriate secretion of antidiuretic hormone: Secondary | ICD-10-CM | POA: Diagnosis present

## 2017-07-27 DIAGNOSIS — Z8709 Personal history of other diseases of the respiratory system: Secondary | ICD-10-CM | POA: Diagnosis not present

## 2017-07-27 DIAGNOSIS — E46 Unspecified protein-calorie malnutrition: Secondary | ICD-10-CM | POA: Diagnosis not present

## 2017-07-27 DIAGNOSIS — R0602 Shortness of breath: Secondary | ICD-10-CM | POA: Diagnosis not present

## 2017-07-27 DIAGNOSIS — I2510 Atherosclerotic heart disease of native coronary artery without angina pectoris: Secondary | ICD-10-CM | POA: Diagnosis not present

## 2017-07-27 DIAGNOSIS — I255 Ischemic cardiomyopathy: Secondary | ICD-10-CM | POA: Diagnosis not present

## 2017-07-27 LAB — BASIC METABOLIC PANEL
BUN: 7 mg/dL (ref 6–20)
CALCIUM: 8.2 mg/dL — AB (ref 8.9–10.3)
CO2: 43 mmol/L — ABNORMAL HIGH (ref 22–32)
CREATININE: 0.52 mg/dL (ref 0.44–1.00)
Chloride: <65 mmol/L — CL (ref 101–111)
GFR calc Af Amer: >60 mL/min (ref >60)
GLUCOSE: 107 mg/dL — AB (ref 65–99)
POTASSIUM: 3.3 mmol/L — AB (ref 3.5–5.1)
Sodium: 120 mmol/L — ABNORMAL LOW (ref 135–145)

## 2017-07-27 LAB — GLUCOSE, CAPILLARY
GLUCOSE-CAPILLARY: 123 mg/dL — AB (ref 65–99)
Glucose-Capillary: 83 mg/dL (ref 65–99)
Glucose-Capillary: 121 mg/dL — ABNORMAL HIGH (ref 65–99)
Glucose-Capillary: 101 mg/dL — ABNORMAL HIGH (ref 65–99)

## 2017-07-27 LAB — COMPREHENSIVE METABOLIC PANEL
ALBUMIN: 3.3 g/dL — AB (ref 3.5–5.0)
ALK PHOS: 93 U/L (ref 38–126)
ALT: 17 U/L (ref 14–54)
ANION GAP: 9 (ref 5–15)
AST: 22 U/L (ref 15–41)
BUN: 6 mg/dL (ref 6–20)
CHLORIDE: 69 mmol/L — AB (ref 101–111)
CO2: 42 mmol/L — AB (ref 22–32)
Calcium: 8.4 mg/dL — ABNORMAL LOW (ref 8.9–10.3)
Creatinine, Ser: 0.51 mg/dL (ref 0.44–1.00)
GFR calc non Af Amer: >60 mL/min (ref >60)
GLUCOSE: 92 mg/dL (ref 65–99)
POTASSIUM: 4 mmol/L (ref 3.5–5.1)
SODIUM: 120 mmol/L — AB (ref 135–145)
Total Bilirubin: 1.1 mg/dL (ref 0.3–1.2)
Total Protein: 5.3 g/dL — ABNORMAL LOW (ref 6.5–8.1)

## 2017-07-27 LAB — CREATININE, URINE, RANDOM: Creatinine, Urine: 119.63 mg/dL

## 2017-07-27 LAB — CBC
HEMATOCRIT: 28.3 % — AB (ref 36.0–46.0)
HEMOGLOBIN: 9.5 g/dL — AB (ref 12.0–15.0)
MCH: 29 pg (ref 26.0–34.0)
MCHC: 33.6 g/dL (ref 30.0–36.0)
MCV: 86.3 fL (ref 78.0–100.0)
Platelets: 178 10*3/uL (ref 150–400)
RBC: 3.28 MIL/uL — AB (ref 3.87–5.11)
RDW: 14 % (ref 11.5–15.5)
WBC: 4.5 10*3/uL (ref 4.0–10.5)

## 2017-07-27 LAB — OSMOLALITY: OSMOLALITY: 248 mosm/kg — AB (ref 275–295)

## 2017-07-27 LAB — OSMOLALITY, URINE: OSMOLALITY UR: 410 mosm/kg (ref 300–900)

## 2017-07-27 LAB — CBG MONITORING, ED: Glucose-Capillary: 120 mg/dL — ABNORMAL HIGH (ref 65–99)

## 2017-07-27 LAB — NA AND K (SODIUM & POTASSIUM), RAND UR
POTASSIUM UR: 51 mmol/L
Sodium, Ur: 43 mmol/L

## 2017-07-27 LAB — CHLORIDE, URINE, RANDOM: Chloride Urine: 91 mmol/L

## 2017-07-27 MED ORDER — ORAL CARE MOUTH RINSE
15.0000 mL | Freq: Two times a day (BID) | OROMUCOSAL | Status: DC
  Administered 2017-07-28 – 2017-08-03 (×11): 15 mL via OROMUCOSAL

## 2017-07-27 MED ORDER — FUROSEMIDE 10 MG/ML IJ SOLN: 80.0000 mg | Freq: Once | INTRAMUSCULAR | Status: DC

## 2017-07-27 MED ORDER — SODIUM CHLORIDE 0.9 % IV SOLN
INTRAVENOUS | Status: DC
  Administered 2017-07-27 (×2): via INTRAVENOUS

## 2017-07-27 MED ORDER — SODIUM CHLORIDE 0.9 % IV BOLUS (SEPSIS)
500.0000 mL | Freq: Once | INTRAVENOUS | Status: AC
  Administered 2017-07-27: 500 mL via INTRAVENOUS

## 2017-07-27 MED ORDER — FUROSEMIDE 10 MG/ML IJ SOLN
80.0000 mg | Freq: Two times a day (BID) | INTRAMUSCULAR | Status: DC
  Administered 2017-07-27: 80 mg via INTRAVENOUS
  Filled 2017-07-27: qty 8

## 2017-07-27 NOTE — Care Management Obs Status (Signed)
MEDICARE OBSERVATION STATUS NOTIFICATION   Patient Details  Name: Lum KeasKathy D Salem MRN: [id number] Date of Birth: 1951/02/26   Medicare Observation Status Notification Given:  Yes    MahabirOlegario Messier, Yazhini, RN 07/27/2017, 12:48 PM

## 2017-07-27 NOTE — Progress Notes (Addendum)
PROGRESS NOTE    Samantha Dyer  EAV:[id number] DOB: [birth year] DOA: 07/26/2017 PCP: Terri PiedraForcucci, Courtney, PA-C    ADDENDUMy @ 540pm After reviewing the labs patient may be intravascularly volume depletion as she is on lasix and Zaroxyln. Repeat lab still showed Na 120, but now Cl is <65, CO2 slightly increased 42 to 43. Urine Na >40, but patient is on diuretics even at home. For now I will stop the lasix, start the patient on gentle hydration NS 50cc/hr, repeat lab tomorrow morning, replete K.    Brief Narrative:   66 year old female with history of CAD status post CABG in October 2018, advanced COPD on home oxygen 2 L, GERD, iron deficiency anemia came to the hospital for evaluation of hyponatremia.  Assessment & Plan:   Active Problems:   Hyponatremia   Hyponatremia - It appears to be secondary to volume overload given her physical findings, chest x-ray - Urine electrolytes, urine and Serum Osm have been sent.  -Will order fluid restriction 1200 cc, give IV Lasix 80 mg 2 doses today.  We will check response to it later today and tomorrow.   Acute exacerbation of grade 2 diastolic CHF, ejection fraction 55-60% - Appears to be volume overloaded; I will place her on fluid restriction, 1200 cc and give 2 doses of Lasix today.  Monitor her response and closely follow sodium levels -Strict input and output, daily weights.  Monitor electrolytes  Chronic hypoxic respiratory failure, on home oxygen COPD Right-sided pleural effusion -Maintain supplemental oxygen, bronchodilators and supportive care   History of CAD with recent CABG in October 2018 -Continue home medications aspirin, statin and beta-blocker  Paroxysmal atrial fibrillation -Continue amiodarone  Iron deficiency anemia Continue iron supplements   DVT prophylaxis: SCDs Code Status: Full code Family Communication: Bedside Disposition Plan: Inpatient telemetry  It is my clinical opinion that admission to INPATIENT  is reasonable and necessary in this 66 y.o. female . presenting with evaluation of hyponatremia, concerning for volume overload  . in the context of PMH including: CAD with recent CABG, diastolic CHF, chronic hypoxic respiratory failure on 2 L oxygen at home, COPD . with pertinent positives on physical exam including: Lower extremity edema, diminished breath sounds at bases of both lungs . and pertinent positives on radiographic and laboratory data including: Elevated BNP, hyponatremia . Workup and treatment include diuresis while closely monitoring her sodium.   Given the aforementioned, the predictability of an adverse outcome is felt to be significant. I expect that the patient will require at least 2 midnights in the hospital to treat this condition.   Consultants:  None Procedures:   none  Antimicrobials:   None   Subjective: Patient is resting well this morning.  Does not have any new complaints besides shortness of breath with exertion.  Objective: Vitals:   07/27/17 0524 07/27/17 0530 07/27/17 0545 07/27/17 0631  BP: (!) 161/64 (!) 175/67 (!) 168/88 (!) 165/62  Pulse: 73 73 73 74  Resp: 16 17 14    Temp:    97.7 F (36.5 C)  TempSrc:    Oral  SpO2: 100%   100%  Weight:    60.7 kg (133 lb 12.8 oz)  Height:    5\' 4"  (1.626 m)    Intake/Output Summary (Last 24 hours) at 07/27/2017 1004 Last data filed at 07/27/2017 0811 Gross per 24 hour  Intake -  Output 100 ml  Net -100 ml   Filed Weights   07/27/17 0631  Weight: 60.7 kg (133  lb 12.8 oz)    Examination:  General exam: Appears calm and comfortable, on supplemental oxygen nasal cannula in place Respiratory system: Diminished breath sounds at bilateral bases Cardiovascular system: S1 & S2 heard, RRR. No JVD, murmurs, rubs, gallops or clicks.  2+ lower extremity pitting edema.  CABG scar noted middle of her chest without any signs of active infection or bleeding Gastrointestinal system: Abdomen is nondistended,  soft and nontender. No organomegaly or masses felt. Normal bowel sounds heard. Central nervous system: Alert and oriented. No focal neurological deficits. Extremities: Symmetric 5 x 5 power. Skin: No rashes, lesions or ulcers Psychiatry: Judgement and insight appear normal. Mood & affect appropriate.     Data Reviewed:   CBC: Recent Labs  Lab 07/26/17 2051 07/27/17 0524  WBC 6.4 4.5  NEUTROABS 5.7  --   HGB 10.1* 9.5*  HCT 30.1* 28.3*  MCV 87.0 86.3  PLT 178 178   Basic Metabolic Panel: Recent Labs  Lab 07/26/17 2051 07/27/17 0524  NA 120* 120*  K 3.7 4.0  CL 66* 69*  CO2 44* 42*  GLUCOSE 119* 92  BUN 7 6  CREATININE 0.53 0.51  CALCIUM 8.5* 8.4*   GFR: Estimated Creatinine Clearance: 60.5 mL/min (by C-G formula based on SCr of 0.51 mg/dL). Liver Function Tests: Recent Labs  Lab 07/27/17 0524  AST 22  ALT 17  ALKPHOS 93  BILITOT 1.1  PROT 5.3*  ALBUMIN 3.3*   No results for input(s): LIPASE, AMYLASE in the last 168 hours. No results for input(s): AMMONIA in the last 168 hours. Coagulation Profile: No results for input(s): INR, PROTIME in the last 168 hours. Cardiac Enzymes: No results for input(s): CKTOTAL, CKMB, CKMBINDEX, TROPONINI in the last 168 hours. BNP (last 3 results) No results for input(s): PROBNP in the last 8760 hours. HbA1C: No results for input(s): HGBA1C in the last 72 hours. CBG: Recent Labs  Lab 07/27/17 0047 07/27/17 0735  GLUCAP 120* 123*   Lipid Profile: No results for input(s): CHOL, HDL, LDLCALC, TRIG, CHOLHDL, LDLDIRECT in the last 72 hours. Thyroid Function Tests: No results for input(s): TSH, T4TOTAL, FREET4, T3FREE, THYROIDAB in the last 72 hours. Anemia Panel: No results for input(s): VITAMINB12, FOLATE, FERRITIN, TIBC, IRON, RETICCTPCT in the last 72 hours. Sepsis Labs: No results for input(s): PROCALCITON, LATICACIDVEN in the last 168 hours.  No results found for this or any previous visit (from the past 240  hour(s)).       Radiology Studies: Dg Chest 2 View  Result Date: 07/26/2017 CLINICAL DATA:  Shortness of breath for 1 month EXAM: CHEST  2 VIEW COMPARISON:  07/24/2017 FINDINGS: Cardiac shadow is stable. Postoperative changes are again seen. Right-sided pleural effusion is noted slightly greater than that seen on the prior exam. Mild vascular congestion is noted new from the prior study. No other focal abnormality is seen. IMPRESSION: Slight increase in right-sided pleural effusion. Vascular congestion. Electronically Signed   By: Alcide CleverMark  Lukens M.D.   On: 07/26/2017 21:00        Scheduled Meds: . amiodarone  400 mg Oral Daily  . aspirin EC  81 mg Oral Daily  . atorvastatin  80 mg Oral q1800  . ferrous gluconate  324 mg Oral BID WC  . fluticasone furoate-vilanterol  1 puff Inhalation Daily  . furosemide  80 mg Intravenous BID  . insulin aspart  0-15 Units Subcutaneous TID WC  . insulin aspart  0-5 Units Subcutaneous QHS  . metoprolol tartrate  25 mg  Oral BID   Continuous Infusions:   LOS: 0 days    Time spent: 35 mins     Kelsei Defino Joline Maxcy, MD [facility] Pager [phone]   If 7PM-7AM, please contact night-coverage [email or web address] Password [email or web address] 07/27/2017, 10:04 AM

## 2017-07-27 NOTE — Progress Notes (Signed)
CRITICAL VALUE ALERT  Critical Value:  Chloride less than 65  Date & Time Notied:  07/27/2017 1651  Provider Notified: Dr. Nelson ChimesAmin  Orders Received/Actions taken: aware will monitor

## 2017-07-27 NOTE — Progress Notes (Signed)
CRITICAL VALUE ALERT  Critical Value:  Serum Osmolality 248  Date & Time Notied:  07/27/2017 [id number]  Provider Notified: Dr. Nelson ChimesAmin  Orders Received/Actions taken: MD aware, treating

## 2017-07-27 NOTE — Telephone Encounter (Signed)
Patient was walked yesterday at the [facility]. [facility]

## 2017-07-27 NOTE — ED Notes (Signed)
Bed: WA11 Expected date:  Expected time:  Means of arrival:  Comments: Room 5 

## 2017-07-27 NOTE — ED Notes (Signed)
Bed assigned 1418. RN made aware

## 2017-07-27 NOTE — Care Management Note (Signed)
Case Management Note  Patient Details  Name: Lum KeasKathy D [place] MRN: [id number] Date of Birth: [birth year]  Subjective/Objective: 66 y/o f admitted w/hyponatremia. From home. Has home 02-has travel tank.                   Action/Plan:d/c plan home.   Expected Discharge Date:  (unknown)               Expected Discharge Plan:  Home/Self Care  In-House Referral:     Discharge planning Services  CM Consult  Post Acute Care Choice:  Durable Medical Equipment(Active w/home 02-Family medicine-has travel tank) Choice offered to:     DME Arranged:    DME Agency:     HH Arranged:    HH Agency:     Status of Service:  In process, will continue to follow  If discussed at Long Length of Stay Meetings, dates discussed:    Additional Comments:  Lanier ClamMahabir, Corinthian, RN 07/27/2017, 12:57 PM

## 2017-07-27 NOTE — Telephone Encounter (Signed)
Samantha Dyer was this walk done at her appointment?

## 2017-07-28 LAB — BASIC METABOLIC PANEL
ANION GAP: 9 (ref 5–15)
BUN: 6 mg/dL (ref 6–20)
CO2: 46 mmol/L — ABNORMAL HIGH (ref 22–32)
Calcium: 7.9 mg/dL — ABNORMAL LOW (ref 8.9–10.3)
Chloride: 66 mmol/L — ABNORMAL LOW (ref 101–111)
Creatinine, Ser: 0.46 mg/dL (ref 0.44–1.00)
Glucose, Bld: 85 mg/dL (ref 65–99)
POTASSIUM: 3 mmol/L — AB (ref 3.5–5.1)
SODIUM: 121 mmol/L — AB (ref 135–145)

## 2017-07-28 LAB — GLUCOSE, CAPILLARY
GLUCOSE-CAPILLARY: 95 mg/dL (ref 65–99)
GLUCOSE-CAPILLARY: 139 mg/dL — AB (ref 65–99)
GLUCOSE-CAPILLARY: 85 mg/dL (ref 65–99)
Glucose-Capillary: 129 mg/dL — ABNORMAL HIGH (ref 65–99)

## 2017-07-28 MED ORDER — POTASSIUM CHLORIDE CRYS ER 20 MEQ PO TBCR
40.0000 meq | EXTENDED_RELEASE_TABLET | Freq: Two times a day (BID) | ORAL | Status: AC
  Administered 2017-07-28 (×2): 40 meq via ORAL
  Filled 2017-07-28 (×2): qty 2

## 2017-07-28 MED ORDER — POTASSIUM CHLORIDE CRYS ER 20 MEQ PO TBCR
20.0000 meq | EXTENDED_RELEASE_TABLET | Freq: Every day | ORAL | Status: DC
  Administered 2017-07-29 – 2017-07-30 (×2): 20 meq via ORAL
  Filled 2017-07-28 (×2): qty 1

## 2017-07-28 NOTE — Progress Notes (Signed)
PROGRESS NOTE    Samantha DriversKathy D Heidecker  ZOX:[id number] DOB: 08-Aug-1951 DOA: 07/26/2017 PCP: Terri PiedraForcucci, Courtney, PA-C    Brief Narrative:   66 year old female with history of CAD status post CABG in October 2018, advanced COPD on home oxygen 2 L, GERD, iron deficiency anemia came to the hospital for evaluation of hyponatremia.  Assessment & Plan:   Active Problems:   Hyponatremia   Hyponatremia - Vol depleted clinically and on N/S - f/u-Urine electrolytes, urine and Serum Osm.    Acute exacerbation of grade 2 diastolic CHF, ejection fraction 55-60% - Gentle diuresis -Strict input and output, daily weights.  Monitor electrolytes  Chronic hypoxic respiratory failure, on home oxygen COPD Right-sided pleural effusion -Maintain supplemental oxygen, bronchodilators and supportive care   History of CAD with recent CABG in October 2018 -Continue home medications aspirin, statin and beta-blocker  Paroxysmal atrial fibrillation -Continue amiodarone  Iron deficiency anemia Continue iron supplements   DVT prophylaxis: SCDs Code Status: Full code Family Communication: Bedside Disposition Plan: Inpatient telemetry  Consultants:  None Procedures:   none  Antimicrobials:   None   Subjective: Same months.  Chart reviewed.  Denies any fever or chills.  She insists on getting tomato soup.  Shortness of breath is better  Objective: Vitals:   07/27/17 1334 07/27/17 2231 07/28/17 0509 07/28/17 1100  BP: (!) 93/54 (!) 138/51 (!) 153/56 (!) 141/56  Pulse: 73 84 77 75  Resp:  16 16   Temp: 98.9 F (37.2 C) 98.4 F (36.9 C) 98.2 F (36.8 C)   TempSrc: Oral Oral Oral   SpO2: 95% 96% 99%   Weight:   58.7 kg (129 lb 4.8 oz)   Height:        Intake/Output Summary (Last 24 hours) at 07/28/2017 1151 Last data filed at 07/28/2017 0600 Gross per 24 hour  Intake 610.83 ml  Output 2200 ml  Net -1589.17 ml   Filed Weights   07/27/17 0631 07/28/17 0509  Weight: 60.7 kg (133 lb  12.8 oz) 58.7 kg (129 lb 4.8 oz)    Examination:  General exam: Comfortable, no acute distress Respiratory system: Diminished breath sounds at bilateral bases Cardiovascular system: Regular rate and rhythm, no gallops Gastrointestinal system: Abdomen is nondistended, soft and nontender. No organomegaly or masses felt. Normal bowel sounds heard. Central nervous system: Alert and oriented. No focal neurological deficits. Extremities: 2+ lower extremity pitting edema Skin: No rashes, lesions or ulcers Psychiatry: Judgement and insight appear normal. Mood & affect appropriate.     Data Reviewed:   CBC: Recent Labs  Lab 07/26/17 2051 07/27/17 0524  WBC 6.4 4.5  NEUTROABS 5.7  --   HGB 10.1* 9.5*  HCT 30.1* 28.3*  MCV 87.0 86.3  PLT 178 178   Basic Metabolic Panel: Recent Labs  Lab 07/26/17 2051 07/27/17 0524 07/27/17 1552 07/28/17 0450  NA 120* 120* 120* 121*  K 3.7 4.0 3.3* 3.0*  CL 66* 69* <65* 66*  CO2 44* 42* 43* 46*  GLUCOSE 119* 92 107* 85  BUN 7 6 7 6   CREATININE 0.53 0.51 0.52 0.46  CALCIUM 8.5* 8.4* 8.2* 7.9*   GFR: Estimated Creatinine Clearance: 60.5 mL/min (by C-G formula based on SCr of 0.46 mg/dL). Liver Function Tests: Recent Labs  Lab 07/27/17 0524  AST 22  ALT 17  ALKPHOS 93  BILITOT 1.1  PROT 5.3*  ALBUMIN 3.3*   No results for input(s): LIPASE, AMYLASE in the last 168 hours. No results for input(s): AMMONIA in the  last 168 hours. Coagulation Profile: No results for input(s): INR, PROTIME in the last 168 hours. Cardiac Enzymes: No results for input(s): CKTOTAL, CKMB, CKMBINDEX, TROPONINI in the last 168 hours. BNP (last 3 results) No results for input(s): PROBNP in the last 8760 hours. HbA1C: No results for input(s): HGBA1C in the last 72 hours. CBG: Recent Labs  Lab 07/27/17 1138 07/27/17 1709 07/27/17 2235 07/28/17 0754 07/28/17 1133  GLUCAP 83 121* 101* 95 129*   Lipid Profile: No results for input(s): CHOL, HDL, LDLCALC,  TRIG, CHOLHDL, LDLDIRECT in the last 72 hours. Thyroid Function Tests: No results for input(s): TSH, T4TOTAL, FREET4, T3FREE, THYROIDAB in the last 72 hours. Anemia Panel: No results for input(s): VITAMINB12, FOLATE, FERRITIN, TIBC, IRON, RETICCTPCT in the last 72 hours. Sepsis Labs: No results for input(s): PROCALCITON, LATICACIDVEN in the last 168 hours.  No results found for this or any previous visit (from the past 240 hour(s)).       Radiology Studies: Dg Chest 2 View  Result Date: 07/26/2017 CLINICAL DATA:  Shortness of breath for 1 month EXAM: CHEST  2 VIEW COMPARISON:  07/24/2017 FINDINGS: Cardiac shadow is stable. Postoperative changes are again seen. Right-sided pleural effusion is noted slightly greater than that seen on the prior exam. Mild vascular congestion is noted new from the prior study. No other focal abnormality is seen. IMPRESSION: Slight increase in right-sided pleural effusion. Vascular congestion. Electronically Signed   By: Alcide CleverMark  Lukens M.D.   On: 07/26/2017 21:00        Scheduled Meds: . amiodarone  400 mg Oral Daily  . aspirin EC  81 mg Oral Daily  . atorvastatin  80 mg Oral q1800  . ferrous gluconate  324 mg Oral BID WC  . fluticasone furoate-vilanterol  1 puff Inhalation Daily  . insulin aspart  0-15 Units Subcutaneous TID WC  . insulin aspart  0-5 Units Subcutaneous QHS  . mouth rinse  15 mL Mouth Rinse BID  . metoprolol tartrate  25 mg Oral BID   Continuous Infusions: . sodium chloride 50 mL/hr at 07/27/17 2308     LOS: 1 day    Time spent: 25 mins     OSEI-BONSU,Yovana Scogin, MD [facility] Pager [phone]  If 7PM-7AM, please contact night-coverage [email or web address] Password [email or web address] 07/28/2017, 11:51 AM

## 2017-07-29 ENCOUNTER — Inpatient Hospital Stay (HOSPITAL_COMMUNITY): Payer: Medicare Other

## 2017-07-29 LAB — BASIC METABOLIC PANEL
ANION GAP: 11 (ref 5–15)
BUN: 8 mg/dL (ref 6–20)
CHLORIDE: 67 mmol/L — AB (ref 101–111)
CO2: 44 mmol/L — AB (ref 22–32)
Calcium: 8.1 mg/dL — ABNORMAL LOW (ref 8.9–10.3)
Creatinine, Ser: 0.53 mg/dL (ref 0.44–1.00)
GFR calc non Af Amer: >60 mL/min (ref >60)
Glucose, Bld: 83 mg/dL (ref 65–99)
Potassium: 3.6 mmol/L (ref 3.5–5.1)
Sodium: 122 mmol/L — ABNORMAL LOW (ref 135–145)

## 2017-07-29 LAB — GLUCOSE, CAPILLARY
GLUCOSE-CAPILLARY: 89 mg/dL (ref 65–99)
Glucose-Capillary: 89 mg/dL (ref 65–99)
Glucose-Capillary: 101 mg/dL — ABNORMAL HIGH (ref 65–99)
Glucose-Capillary: 136 mg/dL — ABNORMAL HIGH (ref 65–99)

## 2017-07-29 MED ORDER — AZITHROMYCIN 250 MG PO TABS
500.0000 mg | ORAL_TABLET | Freq: Every day | ORAL | Status: AC
  Administered 2017-07-29: 500 mg via ORAL
  Filled 2017-07-29: qty 2

## 2017-07-29 MED ORDER — AZITHROMYCIN 250 MG PO TABS
250.0000 mg | ORAL_TABLET | Freq: Every day | ORAL | Status: AC
  Administered 2017-07-30 – 2017-08-02 (×4): 250 mg via ORAL
  Filled 2017-07-29 (×4): qty 1

## 2017-07-29 NOTE — Progress Notes (Signed)
Patient complaining of feeling SOB. VSS. Lungs sound wet and patient with increased WOB. [facility] Provider on call notified. Awaiting orders will continue to monitor patient.

## 2017-07-29 NOTE — Progress Notes (Signed)
PROGRESS NOTE    Samantha Dyer  ZOX:[id number] DOB: Sep 28, 1950 DOA: 07/26/2017 PCP: Terri PiedraForcucci, Courtney, PA-C    Brief Narrative:   66 year old female with history of CAD status post CABG in October 2018, advanced COPD on home oxygen 2 L, GERD, iron deficiency anemia came to the hospital for evaluation of hyponatremia.  Assessment & Plan:   Active Problems:   Hyponatremia   Hyponatremia - Vol depleted clinically and on N/S - f/u-Urine electrolytes, urine and Serum Osm.    Acute exacerbation of grade 2 diastolic CHF, ejection fraction 55-60% - Gentle diuresis -Strict input and output, daily weights.  Monitor electrolytes  Chronic hypoxic respiratory failure, on home oxygen COPD Right-sided pleural effusion -Maintain supplemental oxygen, bronchodilators and supportive care   History of CAD with recent CABG in October 2018 -Continue home medications aspirin, statin and beta-blocker  Paroxysmal atrial fibrillation -Continue amiodarone  Iron deficiency anemia Continue iron supplements   DVT prophylaxis: SCDs Code Status: Full code Family Communication: Bedside Disposition Plan: Inpatient telemetry  Consultants:  None Procedures:   none  Antimicrobials:   None   Subjective: Same months.  Chart reviewed.  Denies any fever or chills.. No new complaints  Objective: Vitals:   07/29/17 0714 07/29/17 1014 07/29/17 1458 07/29/17 1500  BP:  103/61 (!) 94/54 (!) 144/50  Pulse:  80 82   Resp:   16   Temp:   98.2 F (36.8 C)   TempSrc:   Oral   SpO2: 100%  99%   Weight:      Height:        Intake/Output Summary (Last 24 hours) at 07/29/2017 1624 Last data filed at 07/29/2017 0700 Gross per 24 hour  Intake 850 ml  Output 850 ml  Net 0 ml   Filed Weights   07/27/17 0631 07/28/17 0509 07/29/17 0426  Weight: 60.7 kg (133 lb 12.8 oz) 58.7 kg (129 lb 4.8 oz) 60.9 kg (134 lb 4.8 oz)    Examination:  General exam: Comfortable, no acute  distress Respiratory system: Diminished breath sounds at bilateral bases Cardiovascular system: Regular rate and rhythm, no gallops Gastrointestinal system: Abdomen is nondistended, soft and nontender. No organomegaly or masses felt. Normal bowel sounds heard. Central nervous system: Alert and oriented. No focal neurological deficits. Extremities: 2+ lower extremity pitting edema Skin: No rashes, lesions or ulcers Psychiatry: Judgement and insight appear normal. Mood & affect appropriate.     Data Reviewed:   CBC: Recent Labs  Lab 07/26/17 2051 07/27/17 0524  WBC 6.4 4.5  NEUTROABS 5.7  --   HGB 10.1* 9.5*  HCT 30.1* 28.3*  MCV 87.0 86.3  PLT 178 178   Basic Metabolic Panel: Recent Labs  Lab 07/26/17 2051 07/27/17 0524 07/27/17 1552 07/28/17 0450 07/29/17 0410  NA 120* 120* 120* 121* 122*  K 3.7 4.0 3.3* 3.0* 3.6  CL 66* 69* <65* 66* 67*  CO2 44* 42* 43* 46* 44*  GLUCOSE 119* 92 107* 85 83  BUN 7 6 7 6 8   CREATININE 0.53 0.51 0.52 0.46 0.53  CALCIUM 8.5* 8.4* 8.2* 7.9* 8.1*   GFR: Estimated Creatinine Clearance: 60.5 mL/min (by C-G formula based on SCr of 0.53 mg/dL). Liver Function Tests: Recent Labs  Lab 07/27/17 0524  AST 22  ALT 17  ALKPHOS 93  BILITOT 1.1  PROT 5.3*  ALBUMIN 3.3*   No results for input(s): LIPASE, AMYLASE in the last 168 hours. No results for input(s): AMMONIA in the last 168 hours. Coagulation Profile:  No results for input(s): INR, PROTIME in the last 168 hours. Cardiac Enzymes: No results for input(s): CKTOTAL, CKMB, CKMBINDEX, TROPONINI in the last 168 hours. BNP (last 3 results) No results for input(s): PROBNP in the last 8760 hours. HbA1C: No results for input(s): HGBA1C in the last 72 hours. CBG: Recent Labs  Lab 07/28/17 1133 07/28/17 1652 07/28/17 2150 07/29/17 0739 07/29/17 1131  GLUCAP 129* 139* 85 89 101*   Lipid Profile: No results for input(s): CHOL, HDL, LDLCALC, TRIG, CHOLHDL, LDLDIRECT in the last 72  hours. Thyroid Function Tests: No results for input(s): TSH, T4TOTAL, FREET4, T3FREE, THYROIDAB in the last 72 hours. Anemia Panel: No results for input(s): VITAMINB12, FOLATE, FERRITIN, TIBC, IRON, RETICCTPCT in the last 72 hours. Sepsis Labs: No results for input(s): PROCALCITON, LATICACIDVEN in the last 168 hours.  No results found for this or any previous visit (from the past 240 hour(s)).       Radiology Studies: No results found.      Scheduled Meds: . amiodarone  400 mg Oral Daily  . aspirin EC  81 mg Oral Daily  . atorvastatin  80 mg Oral q1800  . ferrous gluconate  324 mg Oral BID WC  . fluticasone furoate-vilanterol  1 puff Inhalation Daily  . insulin aspart  0-15 Units Subcutaneous TID WC  . insulin aspart  0-5 Units Subcutaneous QHS  . mouth rinse  15 mL Mouth Rinse BID  . metoprolol tartrate  25 mg Oral BID  . potassium chloride  20 mEq Oral Daily   Continuous Infusions: . sodium chloride 50 mL/hr at 07/27/17 2308     LOS: 2 days    Time spent: 25 mins     OSEI-BONSU,Ahmir Bracken, MD [facility] Pager [phone]  If 7PM-7AM, please contact night-coverage [email or web address] Password [email or web address] 07/29/2017, 4:24 PM

## 2017-07-30 ENCOUNTER — Ambulatory Visit: Payer: Self-pay

## 2017-07-30 DIAGNOSIS — E871 Hypo-osmolality and hyponatremia: Secondary | ICD-10-CM

## 2017-07-30 DIAGNOSIS — I5031 Acute diastolic (congestive) heart failure: Secondary | ICD-10-CM

## 2017-07-30 DIAGNOSIS — L8990 Pressure ulcer of unspecified site, unspecified stage: Secondary | ICD-10-CM

## 2017-07-30 LAB — BLOOD GAS, ARTERIAL
ACID-BASE EXCESS: 15.9 mmol/L — AB (ref 0.0–2.0)
ACID-BASE EXCESS: 16.6 mmol/L — AB (ref 0.0–2.0)
Acid-Base Excess: 15.5 mmol/L — ABNORMAL HIGH (ref 0.0–2.0)
BICARBONATE: 45 mmol/L — AB (ref 20.0–28.0)
BICARBONATE: 45.5 mmol/L — AB (ref 20.0–28.0)
Bicarbonate: 45.4 mmol/L — ABNORMAL HIGH (ref 20.0–28.0)
DELIVERY SYSTEMS: POSITIVE
DRAWN BY: 235321
DRAWN BY: 257881
Delivery systems: POSITIVE
Drawn by: 422461
Expiratory PAP: 5
Expiratory PAP: 5
FIO2: 50
FIO2: 35
INSPIRATORY PAP: 17
Inspiratory PAP: 20
LHR: 8 {breaths}/min
MODE: POSITIVE
O2 CONTENT: 5 L/min
O2 SAT: 58.6 %
O2 SAT: 95.2 %
O2 Saturation: 96 %
PCO2 ART: 90 mmHg — AB (ref 32.0–48.0)
PH ART: 7.294 — AB (ref 7.350–7.450)
PH ART: 7.324 — AB (ref 7.350–7.450)
Patient temperature: 98.6
Patient temperature: 97.6
Patient temperature: 98.6
RATE: 8 resp/min
pCO2 arterial: 96.5 mmHg (ref 32.0–48.0)
pCO2 arterial: 94.4 mmHg (ref 32.0–48.0)
pH, Arterial: 7.296 — ABNORMAL LOW (ref 7.350–7.450)
pO2, Arterial: 34.8 mmHg — CL (ref 83.0–108.0)
pO2, Arterial: 84.9 mmHg (ref 83.0–108.0)
pO2, Arterial: 78.7 mmHg — ABNORMAL LOW (ref 83.0–108.0)

## 2017-07-30 LAB — CBC
HCT: 31.8 % — ABNORMAL LOW (ref 36.0–46.0)
Hemoglobin: 10.9 g/dL — ABNORMAL LOW (ref 12.0–15.0)
MCH: 29.8 pg (ref 26.0–34.0)
MCHC: 34.3 g/dL (ref 30.0–36.0)
MCV: 86.9 fL (ref 78.0–100.0)
PLATELETS: 158 10*3/uL (ref 150–400)
RBC: 3.66 MIL/uL — ABNORMAL LOW (ref 3.87–5.11)
RDW: 14 % (ref 11.5–15.5)
WBC: 8.1 10*3/uL (ref 4.0–10.5)

## 2017-07-30 LAB — BASIC METABOLIC PANEL
ANION GAP: 7 (ref 5–15)
BUN: 7 mg/dL (ref 6–20)
CALCIUM: 8.8 mg/dL — AB (ref 8.9–10.3)
CO2: 45 mmol/L — ABNORMAL HIGH (ref 22–32)
Chloride: 72 mmol/L — ABNORMAL LOW (ref 101–111)
Creatinine, Ser: 0.47 mg/dL (ref 0.44–1.00)
GFR calc Af Amer: >60 mL/min (ref >60)
Glucose, Bld: 99 mg/dL (ref 65–99)
POTASSIUM: 4.4 mmol/L (ref 3.5–5.1)
SODIUM: 124 mmol/L — AB (ref 135–145)

## 2017-07-30 LAB — GLUCOSE, CAPILLARY
GLUCOSE-CAPILLARY: 97 mg/dL (ref 65–99)
GLUCOSE-CAPILLARY: 99 mg/dL (ref 65–99)
GLUCOSE-CAPILLARY: 99 mg/dL (ref 65–99)
Glucose-Capillary: 96 mg/dL (ref 65–99)
Glucose-Capillary: 70 mg/dL (ref 65–99)

## 2017-07-30 LAB — MRSA PCR SCREENING: MRSA by PCR: NEGATIVE

## 2017-07-30 LAB — BRAIN NATRIURETIC PEPTIDE: B Natriuretic Peptide: 1269 pg/mL — ABNORMAL HIGH (ref 0.0–100.0)

## 2017-07-30 MED ORDER — FUROSEMIDE 10 MG/ML IJ SOLN
40.0000 mg | Freq: Once | INTRAMUSCULAR | Status: AC
  Administered 2017-07-30: 40 mg via INTRAVENOUS
  Filled 2017-07-30: qty 4

## 2017-07-30 MED ORDER — HEPARIN SODIUM (PORCINE) 5000 UNIT/ML IJ SOLN
5000.0000 [IU] | Freq: Three times a day (TID) | INTRAMUSCULAR | Status: DC
  Administered 2017-07-30 – 2017-08-03 (×11): 5000 [IU] via SUBCUTANEOUS
  Filled 2017-07-30 (×12): qty 1

## 2017-07-30 MED ORDER — FUROSEMIDE 10 MG/ML IJ SOLN
60.0000 mg | Freq: Two times a day (BID) | INTRAMUSCULAR | Status: DC
  Administered 2017-07-30: 60 mg via INTRAVENOUS
  Filled 2017-07-30: qty 6

## 2017-07-30 MED ORDER — ENALAPRILAT 1.25 MG/ML IV SOLN
1.2500 mg | Freq: Once | INTRAVENOUS | Status: AC
  Administered 2017-07-30: 1.25 mg via INTRAVENOUS
  Filled 2017-07-30: qty 1

## 2017-07-30 MED ORDER — FUROSEMIDE 10 MG/ML IJ SOLN
60.0000 mg | Freq: Once | INTRAMUSCULAR | Status: AC
  Administered 2017-07-30: 60 mg via INTRAVENOUS
  Filled 2017-07-30: qty 6

## 2017-07-30 MED ORDER — IPRATROPIUM-ALBUTEROL 0.5-2.5 (3) MG/3ML IN SOLN
3.0000 mL | Freq: Four times a day (QID) | RESPIRATORY_TRACT | Status: DC
  Administered 2017-07-30 – 2017-07-31 (×7): 3 mL via RESPIRATORY_TRACT
  Filled 2017-07-30 (×7): qty 3

## 2017-07-30 MED ORDER — METHYLPREDNISOLONE SODIUM SUCC 125 MG IJ SOLR
60.0000 mg | Freq: Two times a day (BID) | INTRAMUSCULAR | Status: DC
  Administered 2017-07-30 – 2017-07-31 (×4): 60 mg via INTRAVENOUS
  Filled 2017-07-30 (×5): qty 2

## 2017-07-30 MED ORDER — IPRATROPIUM-ALBUTEROL 0.5-2.5 (3) MG/3ML IN SOLN: 3.0000 mL | RESPIRATORY_TRACT | Status: DC | PRN

## 2017-07-30 MED ORDER — METOLAZONE 2.5 MG PO TABS
2.5000 mg | ORAL_TABLET | Freq: Once | ORAL | Status: AC
  Administered 2017-07-30: 2.5 mg via ORAL
  Filled 2017-07-30: qty 1

## 2017-07-30 MED ORDER — DEXTROSE 50 % IV SOLN
INTRAVENOUS | Status: AC
  Filled 2017-07-30: qty 50

## 2017-07-30 MED ORDER — NITROGLYCERIN 2 % TD OINT
0.5000 [in_us] | TOPICAL_OINTMENT | Freq: Four times a day (QID) | TRANSDERMAL | Status: DC
  Administered 2017-07-30 – 2017-08-02 (×13): 0.5 [in_us] via TOPICAL
  Filled 2017-07-30: qty 30

## 2017-07-30 NOTE — Progress Notes (Signed)
Patient rested comfortably this shift until recently- pt alerted RN that she "could not breathe". O2 Sats 100% 4L/[place], HR 81, BP 186/63, R 24. Pt leaning forward in bed with shallow, tachypneic breathing. Bilateral lungs continue to sound very wet. IVF stopped at beginning of shift. On-call NP Blount notified- IV Lasix ordered but MD stated not to give until STAT BMP is drawn and Na level is checked. Lab notified. ABG ordered- respiratory notified of orders and pt's status. Will continue to monitor closely.

## 2017-07-30 NOTE — Progress Notes (Signed)
On-call NP Blount notified of patient's CXR results in Epic. PO ABT ordered. Pt is more relaxed at this time, VSS. No evidence of distress or discomfort.

## 2017-07-30 NOTE — Consult Note (Signed)
PULMONARY / CRITICAL CARE MEDICINE   Name: Samantha Dyer MRN: [id number] DOB: [birth year]    ADMISSION DATE:  07/26/2017 CONSULTATION DATE:  12/10  REFERRING MD:  Thedore MinsSingh   CHIEF COMPLAINT: Acute on chronic hypoxic and hypercarbic respiratory failure  HISTORY OF PRESENT ILLNESS:   This is a 66 year old white female With a known history of chronic hypoxic and hypercarbic respiratory failure in the setting of: COPD Gold stage IV with FEV1 30% predicted, systolic and diastolic heart dysfunction with EF of 40-45% recent coronary artery bypass grafting in November 2018 with exudative right post thoracotomy pleural effusion.  She underwent thoracentesis towards the end of November 2018 which is complicated by ex vacuo pneumothorax.  She was seen by thoracic surgery who offered pigtail catheter to see if we can get the space to reexpand, the patient declined this.  She ultimately went home discharge once again on Lasix, Zaroxolyn has been discontinued.  She was also treated during that stay for COPD exacerbation with antibiotic and prednisone taper.  She was discharged on oxygen.  She was seen last in our office on 12/6 at that time the pneumothorax had resolved but she again had a significant right pleural effusion.  She was noted to be very weak but had no evidence of exacerbation.  She was found to still be smoking and again explained the importance of smoking cessation.  She again presents to the emergency room on 12/6 after being sent by her primary care provider for evaluation once again of hyponatremia.  She was admitted to the medical service: Serum osmolality noted to be 248, urine Osmo 410, urine sodium was 43 (findings actually consistent with SIADH).  Initially clinically however medical service felt patient perhaps dehydrated and therefore was treated with IV hydration with isotonic saline.  Her sodium did improve with slightly from 120 up to 124 over the following 3 days, then during early a.m.  hours on 12/10 the patient called out to nursing reporting she could not breathe.  Apparently on exam she sounded "wet", [facility] was given IV Lasix then transferred to the intensive care for noninvasive positive pressure ventilation.  Critical care was asked to see her given acute on chronic hypercarbic respiratory failure.  PAST MEDICAL HISTORY :  She  has a past medical history of Allergic rhinitis, cause unspecified, Anemia, unspecified, Backache, unspecified, CAD (coronary artery disease), COPD (chronic obstructive pulmonary disease) (HCC), Esophageal reflux, and Iron deficiency anemia, unspecified.  PAST SURGICAL HISTORY: She  has a past surgical history that includes RIGHT/LEFT HEART CATH AND CORONARY ANGIOGRAPHY (N/A, 06/01/2017); Coronary artery bypass graft (N/A, 06/06/2017); and TEE without cardioversion (N/A, 06/06/2017).  Allergies  Allergen Reactions  . Amoxicillin-Pot Clavulanate Swelling  . Augmentin [Amoxicillin-Pot Clavulanate] Swelling    No current facility-administered medications on file prior to encounter.    Current Outpatient Medications on File Prior to Encounter  Medication Sig  . acetaminophen (TYLENOL) 325 MG tablet Take 650 mg by mouth every 6 (six) hours as needed for mild pain.  Marland Kitchen. amiodarone (PACERONE) 400 MG tablet Take 1 tablet (400 mg total) daily by mouth.  Marland Kitchen. aspirin EC 81 MG EC tablet Take 1 tablet (81 mg total) by mouth daily.  Marland Kitchen. atorvastatin (LIPITOR) 80 MG tablet Take 1 tablet (80 mg total) by mouth daily at 6 PM.  . COMBIVENT RESPIMAT 20-100 MCG/ACT AERS respimat Inhale 1 puff 4 (four) times daily as needed into the lungs for wheezing or shortness of breath.  .Marland Kitchen  ferrous gluconate (FERGON) 324 MG tablet Take 1 tablet (324 mg total) by mouth 2 (two) times daily with a meal.  . fluticasone furoate-vilanterol (BREO ELLIPTA) 200-25 MCG/INH AEPB Inhale 1 puff into the lungs daily.  . furosemide (LASIX) 80 MG tablet Take 0.5 tablets  (40 mg total) 2 (two) times daily by mouth.  . metolazone (ZAROXOLYN) 5 MG tablet Take 5 mg daily by mouth.  . metoprolol tartrate (LOPRESSOR) 25 MG tablet Take 1 tablet (25 mg total) by mouth 2 (two) times daily.  . OXYGEN Inhale 2 L into the lungs continuous.  . potassium chloride SA (K-DUR,KLOR-CON) 20 MEQ tablet Take 1 tablet (20 mEq total) daily by mouth. (Patient taking differently: Take 10 mEq by mouth daily. )  . tiotropium (SPIRIVA HANDIHALER) 18 MCG inhalation capsule inhale contents of one capsule once daily  . traMADol (ULTRAM) 50 MG tablet Take 1 tablet (50 mg total) by mouth every 4 (four) hours as needed for moderate pain. (Patient not taking: Reported on 07/26/2017)    FAMILY HISTORY:  Her indicated that the status of her mother is unknown. She indicated that the status of her father is unknown.   SOCIAL HISTORY: She  reports that she has been smoking cigarettes.  She has a 43.00 pack-year smoking history. she has never used smokeless tobacco. She reports that she does not drink alcohol or use drugs.  REVIEW OF SYSTEMS:   General: No fever, does have general weakness and fatigue HEENT: No headache, sore throat, dizziness or nasal congestion. Pulmonary: Short of breath, no chest pain, some wheezing, no cough.  Cardiac: No chest pain.  Extremities: Lower extremity swelling, no pain abdomen: Poor appetite, no nausea vomiting diarrhea.  GU normal neuro normal endocrine normal  SUBJECTIVE:  Feels a little better on noninvasive  VITAL SIGNS: BP (!) 170/52   Pulse 77   Temp 97.6 F (36.4 C) (Oral)   Resp 12   Ht 5\' 4"  (1.626 m)   Wt 132 lb 15 oz (60.3 kg)   SpO2 97%   BMI 22.82 kg/m    HEMODYNAMICS:    VENTILATOR SETTINGS:    INTAKE / OUTPUT: I/O last 3 completed shifts: In: 1100 [I.V.:1100] Out: 850 [Urine:850]  PHYSICAL EXAMINATION: General: Chronically ill-appearing 66 year old female currently on BiPAP no acute distress. Neuro: Awake alert no focal  deficits HEENT: Normocephalic atraumatic no jugular venous distention BiPAP mask in place Cardiovascular: Regular rate and rhythm Lungs: Labored, some accessory muscle use, prolonged exhalation phase with some expiratory wheeze and scattered rhonchi, diminished right base Abdomen: Soft nontender positive bowel sounds Musculoskeletal: Equal strength and bulk Skin: Warm and dry lower extremity edema noted  LABS:  BMET Recent Labs  Lab 07/28/17 0450 07/29/17 0410 07/30/17 0503  NA 121* 122* 124*  K 3.0* 3.6 4.4  CL 66* 67* 72*  CO2 46* 44* 45*  BUN 6 8 7   CREATININE 0.46 0.53 0.47  GLUCOSE 85 83 99    Electrolytes Recent Labs  Lab 07/28/17 0450 07/29/17 0410 07/30/17 0503  CALCIUM 7.9* 8.1* 8.8*    CBC Recent Labs  Lab 07/26/17 2051 07/27/17 0524 07/30/17 0804  WBC 6.4 4.5 8.1  HGB 10.1* 9.5* 10.9*  HCT 30.1* 28.3* 31.8*  PLT 178 178 158    Coag's No results for input(s): APTT, INR in the last 168 hours.  Sepsis Markers No results for input(s): LATICACIDVEN, PROCALCITON, O2SATVEN in the last 168 hours.  ABG Recent Labs  Lab 07/30/17 0451 07/30/17 0629 07/30/17 1610  PHART 7.294* 7.296* 7.324*  PCO2ART 96.5* 94.4* 90.0*  PO2ART 34.8* 84.9 78.7*    Liver Enzymes Recent Labs  Lab 07/27/17 0524  AST 22  ALT 17  ALKPHOS 93  BILITOT 1.1  ALBUMIN 3.3*    Cardiac Enzymes No results for input(s): TROPONINI, PROBNP in the last 168 hours.  Glucose Recent Labs  Lab 07/28/17 2150 07/29/17 0739 07/29/17 1131 07/29/17 1658 07/29/17 2143 07/30/17 0810  GLUCAP 85 89 101* 136* 89 96    Imaging Dg Chest Port 1 View  Result Date: 07/29/2017 CLINICAL DATA:  Shortness of breath.  History of COPD. EXAM: PORTABLE CHEST 1 VIEW COMPARISON:  Chest radiograph July 26, 2017 FINDINGS: Stable cardiomegaly para calcified aortic knob. Status post median sternotomy for CABG. Similar pulmonary vascular congestion with mild interstitial prominence. Slightly  increased moderate RIGHT pleural effusion with juxta peaking. RIGHT lung base consolidation with air bronchograms. Strandy densities LEFT lung base compatible with atelectasis. No pneumothorax. Soft tissue planes and included osseous structures are unchanged. Surgical clips in the included right abdomen compatible with cholecystectomy. Surgical clips in LEFT abdomen. Calcifications in neck are likely vascular. IMPRESSION: Increasing RIGHT pleural effusion with RIGHT lower lobe consolidation concerning for pneumonia. Stable cardiomegaly, interstitial prominence seen with pulmonary edema or atypical infection. Aortic Atherosclerosis (ICD10-I70.0). Electronically Signed   By: Awilda Metroourtnay  Bloomer M.D.   On: 07/29/2017 20:53     STUDIES:  Urine sodium 43 Urine osmolality 410 Serum Osmo 243  CULTURES:   ANTIBIOTICS:   SIGNIFICANT EVENTS:   LINES/TUBES:   ASSESSMENT / PLAN: Acute on chronic hypoxic and hypercarbic respiratory failure Chronic right exudative pleural effusion History of ex vacuo pneumothorax on the right Acute systolic heart failure Severe deconditioning Possible acute exacerbation chronic obstructive pulmonary disease SIADH Atrial fibrillation   Acute on chronic hypoxic and hypercarbic failure in the setting of acute systolic heart failure, & volume overload superimposed on severe underlying chronic obstructive pulmonary disease and chronic right pleural effusion. Her FEV1 at baseline is 30%, she has had a chronic exudative right effusion after her bypass surgery in early November.  She underwent thoracentesis, and this was complicated by pneumothorax ex vacuo.  Subsequent film has shown if the fluid to have a re-accumulated in the pleural space.  Her current PCO2 is in the 90s, her baseline calculates around 75-76.  She is currently on noninvasive positive pressure ventilation and feels some better.  This is a difficult situation as the patient has underlying obstructive lung  disease but also restrictive component in the setting of pleural effusion as well as deconditioning and now volume overload.  It is not clear that she has an acute exacerbation but would be reasonable to treat bronchospasm given known airflow limitations.  Chest x-ray personally reviewed this demonstrates chronic right pleural effusion/volume loss.  This was obtained on 12/9  Plan/rec. Agree with KVO fluids Agree with diuresis Systemic steroids Scheduled bronchodilators Have made changes to noninvasive ventilator, will watch closely, hopefully can avoid intubation Reasonable to continue antibiotics but I do not think she is infected.  History of atrial fibrillation Plan Continue current medical management   All other issues per primary service  Simonne MartinetPeter E Relena Ivancic ACNP-BC [facility] Pager # [phone] OR # [phone] if no answer

## 2017-07-30 NOTE — Progress Notes (Signed)
 @IPLOG @        PROGRESS NOTE                                                                                                                                                                                                             Patient Demographics:    Samantha Dyer, is a 66 y.o. female, DOB - April 27, 1951, ZOX:[id number]  Admit date - 07/26/2017   Admitting Physician Ankit Joline Maxcyhirag Amin, MD  Outpatient Primary MD for the patient is Louisa SecondForcucci, Courtney, PA-C  LOS - 3  Chief Complaint  Patient presents with  . Shortness of Breath    Hyponatremia        Brief Narrative  66 year old female with history of CAD status post CABG in October 2018 dictated by post CABG pleural effusion, status post thoracentesis which showed exudate but procedure was complicated by a small pneumothorax, advanced COPD on home oxygen 2 L, GERD, iron deficiency anemia, chronic diastolic CHF who was admitted to the hospital with severe shortness of breath and hyponatremia.  She was transferred to my care on 07/30/2017, at that time patient was extremely short of breath on BiPAP and in stepdown unit.    Subjective:    Samantha PeaKathy Kundinger today has, No headache, No chest pain, No abdominal pain - No Nausea, No new weakness tingling or numbness, No Cough, she does have positive shortness of breath along with severe orthopnea.   Assessment  & Plan :     1.  Acute on chronic severe hypoxic and hypercapnic respiratory failure caused by acute on chronic diastolic CHF EF around 55-60% on recent echocardiogram.  She has been placed on BiPAP, currently in stepdown, have started her on Nitropaste, IV Lasix, Zaroxolyn, fluid and salt restriction, Foley catheter to monitor intake output and to help her with her urine output in the setting of using BiPAP, pulmonary on board.  Do not think she has COPD exacerbation but her reserve is extremely low hence we will give a trial of Solu-Medrol as well.  Continue nebulizer treatments both  scheduled and as needed.  Monitor ABGs.  Monitor electrolytes intake and output.  Son updated over the phone.  2.  History of recurrent pleural effusion exudative.  Initially post CABG a few weeks ago.  This seems to have reoccurred, will diurese and monitor, will defer long-term management to pulmonary.  Patient is known to pulmonary in their office.  3.  Advanced COPD underlying.  Uses 2 L nasal cannula oxygen at home.  Plan as in #1 above.  Do not think she has exacerbation but worth giving a short trial of IV steroids.  4.  Hyponatremia.  More consistent with fluid overload.  Diurese and monitor.  5.  CAD.  Status post recent CABG.  Continue her on combination of aspirin, beta-blocker and statin for secondary prevention no acute issues.  6.  Dyslipidemia.  On statin continue.  7.  Paroxysmal atrial fibrillation.  Chads vas 2 score of at least 3.  Currently in sinus, continue beta-blocker, amiodarone, aspirin.  Thereafter outpatient follow-up with cardiology.  Defer long-term anticoagulation to primary cardiologist based on her risks and benefits.  8.  History of chronic iron deficiency anemia.  Stable monitor with diuresis.    Diet : Diet clear liquid Room service appropriate? Yes; Fluid consistency: Thin   Family Communication  :  Son over the phone 07-30-17  Code Status :  Full  Disposition Plan  :  TBD  Consults  :  PCCM  Procedures  :    DVT Prophylaxis  :  Heparin    Lab Results  Component Value Date   PLT 158 07/30/2017    Inpatient Medications  Scheduled Meds: . amiodarone  400 mg Oral Daily  . aspirin EC  81 mg Oral Daily  . atorvastatin  80 mg Oral q1800  . azithromycin  250 mg Oral QHS  . ferrous gluconate  324 mg Oral BID WC  . fluticasone furoate-vilanterol  1 puff Inhalation Daily  . furosemide  60 mg Intravenous BID  . heparin injection (subcutaneous)  5,000 Units Subcutaneous Q8H  . insulin aspart  0-15 Units Subcutaneous TID WC  . insulin aspart   0-5 Units Subcutaneous QHS  . ipratropium-albuterol  3 mL Nebulization Q6H  . mouth rinse  15 mL Mouth Rinse BID  . metolazone  2.5 mg Oral Once  . metoprolol tartrate  25 mg Oral BID  . nitroGLYCERIN  0.5 inch Topical Q6H  . potassium chloride  20 mEq Oral Daily   Continuous Infusions: PRN Meds:.acetaminophen, ipratropium-albuterol  Antibiotics  :    Anti-infectives (From admission, onward)   Start     Dose/Rate Route Frequency Ordered Stop   07/30/17 2200  azithromycin (ZITHROMAX) tablet 250 mg     250 mg Oral Daily at bedtime 07/29/17 2132 08/03/17 2159   07/29/17 2145  azithromycin (ZITHROMAX) tablet 500 mg     500 mg Oral Daily 07/29/17 2132 07/29/17 2230         Objective:   Vitals:   07/30/17 0613 07/30/17 0652 07/30/17 0847 07/30/17 0907  BP: (!) 207/82  (!) 170/52   Pulse: 84 82 76 77  Resp: (!) 21 (!) 23 12   Temp:      TempSrc:      SpO2: 97% 100% 98% 97%  Weight:      Height:        Wt Readings from Last 3 Encounters:  07/30/17 60.3 kg (132 lb 15 oz)  07/05/17 58.4 kg (128 lb 12.8 oz)  06/25/17 58.1 kg (128 lb)     Intake/Output Summary (Last 24 hours) at 07/30/2017 1013 Last data filed at 07/29/2017 1600 Gross per 24 hour  Intake 450 ml  Output -  Net 450 ml     Physical Exam  Awake Alert, Oriented X 3, No new F.N deficits, Normal affect [place].AT,PERRAL Supple Neck, No cervical lymphadenopathy appriciated.  Symmetrical Chest wall movement, Good air movement bilaterally,   RRR,No Gallops, Rubs or new Murmurs, No Parasternal Heave +ve  B.Sounds, Abd Soft, No tenderness, No organomegaly appriciated, No rebound - guarding or rigidity. No Cyanosis, Clubbing, No new Rash or bruise  2+ leg edema, ++ rales, elevated JVD      Data Review:    CBC Recent Labs  Lab 07/26/17 2051 07/27/17 0524 07/30/17 0804  WBC 6.4 4.5 8.1  HGB 10.1* 9.5* 10.9*  HCT 30.1* 28.3* 31.8*  PLT 178 178 158  MCV 87.0 86.3 86.9  MCH 29.2 29.0 29.8  MCHC 33.6 33.6  34.3  RDW 13.9 14.0 14.0  LYMPHSABS 0.3*  --   --   MONOABS 0.3  --   --   EOSABS 0.1  --   --   BASOSABS 0.0  --   --     Chemistries  Recent Labs  Lab 07/27/17 0524 07/27/17 1552 07/28/17 0450 07/29/17 0410 07/30/17 0503  NA 120* 120* 121* 122* 124*  K 4.0 3.3* 3.0* 3.6 4.4  CL 69* <65* 66* 67* 72*  CO2 42* 43* 46* 44* 45*  GLUCOSE 92 107* 85 83 99  BUN 6 7 6 8 7   CREATININE 0.51 0.52 0.46 0.53 0.47  CALCIUM 8.4* 8.2* 7.9* 8.1* 8.8*  AST 22  --   --   --   --   ALT 17  --   --   --   --   ALKPHOS 93  --   --   --   --   BILITOT 1.1  --   --   --   --    ------------------------------------------------------------------------------------------------------------------ No results for input(s): CHOL, HDL, LDLCALC, TRIG, CHOLHDL, LDLDIRECT in the last 72 hours.  No results found for: HGBA1C ------------------------------------------------------------------------------------------------------------------ No results for input(s): TSH, T4TOTAL, T3FREE, THYROIDAB in the last 72 hours.  Invalid input(s): FREET3 ------------------------------------------------------------------------------------------------------------------ No results for input(s): VITAMINB12, FOLATE, FERRITIN, TIBC, IRON, RETICCTPCT in the last 72 hours.  Coagulation profile No results for input(s): INR, PROTIME in the last 168 hours.  No results for input(s): DDIMER in the last 72 hours.  Cardiac Enzymes No results for input(s): CKMB, TROPONINI, MYOGLOBIN in the last 168 hours.  Invalid input(s): CK ------------------------------------------------------------------------------------------------------------------    Component Value Date/Time   BNP 1,269.0 (H) 07/30/2017 0804    Micro Results Recent Results (from the past 240 hour(s))  MRSA PCR Screening     Status: None   Collection Time: 07/30/17  6:35 AM  Result Value Ref Range Status   MRSA by PCR NEGATIVE NEGATIVE Final    Comment:         The GeneXpert MRSA Assay (FDA approved for NASAL specimens only), is one component of a comprehensive MRSA colonization surveillance program. It is not intended to diagnose MRSA infection nor to guide or monitor treatment for MRSA infections.     Radiology Reports Dg Chest 2 View  Result Date: 07/26/2017 CLINICAL DATA:  Shortness of breath for 1 month EXAM: CHEST  2 VIEW COMPARISON:  07/24/2017 FINDINGS: Cardiac shadow is stable. Postoperative changes are again seen. Right-sided pleural effusion is noted slightly greater than that seen on the prior exam. Mild vascular congestion is noted new from the prior study. No other focal abnormality is seen. IMPRESSION: Slight increase in right-sided pleural effusion. Vascular congestion. Electronically Signed   By: Alcide CleverMark  Lukens M.D.   On: 07/26/2017 21:00   Dg Chest Port 1 View  Result Date: 07/29/2017 CLINICAL DATA:  Shortness of breath.  History of COPD. EXAM: PORTABLE CHEST 1 VIEW COMPARISON:  Chest radiograph July 26, 2017 FINDINGS:  Stable cardiomegaly para calcified aortic knob. Status post median sternotomy for CABG. Similar pulmonary vascular congestion with mild interstitial prominence. Slightly increased moderate RIGHT pleural effusion with juxta peaking. RIGHT lung base consolidation with air bronchograms. Strandy densities LEFT lung base compatible with atelectasis. No pneumothorax. Soft tissue planes and included osseous structures are unchanged. Surgical clips in the included right abdomen compatible with cholecystectomy. Surgical clips in LEFT abdomen. Calcifications in neck are likely vascular. IMPRESSION: Increasing RIGHT pleural effusion with RIGHT lower lobe consolidation concerning for pneumonia. Stable cardiomegaly, interstitial prominence seen with pulmonary edema or atypical infection. Aortic Atherosclerosis (ICD10-I70.0). Electronically Signed   By: Awilda Metro M.D.   On: 07/29/2017 20:53   Dg Chest Port 1 View  Result  Date: 07/04/2017 CLINICAL DATA:  Respiratory failure EXAM: PORTABLE CHEST 1 VIEW COMPARISON:  July 03, 2017 FINDINGS: Stable hydropneumothorax on the right without tension component. There is a small pleural effusion on the left as well. There is patchy consolidation in both lung bases, stable. There is no evident new opacity. Heart is upper normal in size with pulmonary vascularity within normal limits. There is aortic atherosclerosis. No bone lesions. There is carotid artery calcification on the left. IMPRESSION: Stable hydropneumothorax on the right without tension component. Small left pleural effusion. Bibasilar patchy consolidation. Stable cardiac silhouette. Aortic atherosclerosis as well as calcification in the left carotid artery noted. Aortic Atherosclerosis (ICD10-I70.0). Electronically Signed   By: Bretta Bang III M.D.   On: 07/04/2017 07:30   Dg Chest Port 1 View  Result Date: 07/03/2017 CLINICAL DATA:  Shortness of breath. Known right-sided hydropneumothorax. EXAM: PORTABLE CHEST 1 VIEW COMPARISON:  Chest x-ray of July 02, 2017. FINDINGS: The remains a right-sided hydropneumothorax. The pleural line remains between the fourth and fifth posterior right rib interfaces. On the left there is a small pleural effusion but no pneumothorax. There is no mediastinal shift. Bibasilar densities persist consistent with atelectasis or pneumonia. The heart border is largely obscured. There is prominence of the central pulmonary vascularity. There are post CABG changes. There is calcification in the wall of the aortic arch. IMPRESSION: Persistent right-sided hydropneumothorax and small left pleural effusion. Bibasilar atelectasis or pneumonia. There has not been significant change since yesterday's study. Cardiomegaly with central pulmonary vascular congestion, stable. Previous CABG. Thoracic aortic atherosclerosis. Electronically Signed   By: David  [place] M.D.   On: 07/03/2017 07:58   Dg Chest  Port 1 View  Result Date: 07/02/2017 CLINICAL DATA:  66 year old female with pneumothorax. Prior thoracentesis. Subsequent encounter. EXAM: PORTABLE CHEST 1 VIEW COMPARISON:  06/30/2017 chest x-ray. FINDINGS: Moderately large right-sided hydropneumothorax. This may represent pneumothorax ex vacuo with minimal decrease in size of pneumothorax component although increase in size of pleural effusion component. Consolidation right mid lower lobe may represent atelectasis or infiltrate. Underlying mass not excluded. Mild pulmonary vascular prominence. Small left-sided pleural effusion/ left base atelectasis. Post CABG.  Cardiomegaly. Calcified aorta. IMPRESSION: Moderately large right-sided hydropneumothorax. This may represent pneumothorax ex vacuo with minimal decrease in size of pneumothorax component although increase in size of pleural effusion component. Consolidation right mid lower lobe may represent atelectasis or infiltrate. Mild pulmonary vascular prominence. Small left-sided pleural effusion/ left base atelectasis. Cardiomegaly post CABG. Aortic Atherosclerosis (ICD10-I70.0). Electronically Signed   By: Lacy Duverney M.D.   On: 07/02/2017 07:36    Time Spent in minutes  30   Susa Raring M.D on 07/30/2017 at 10:13 AM  Between 7am to 7pm - Pager - [phone] ( page via [email or web address],  text pages only, please mention full 10 digit call back number). After 7pm go to [email or web address] - password [email or web address]

## 2017-07-30 NOTE — Care Management Note (Signed)
Case Management Note  Patient Details  Name: Samantha Dyer MRN: [id number] Date of Birth: [birth year]  Subjective/Objective:                  hypertensive Action/Plan: Will follow for cm needs  Expected Discharge Date:  (unknown)               Expected Discharge Plan:  Home/Self Care  In-House Referral:     Discharge planning Services  CM Consult  Post Acute Care Choice:  Durable Medical Equipment(Active w/home 02-Family medicine-has travel tank) Choice offered to:     DME Arranged:    DME Agency:     HH Arranged:    HH Agency:     Status of Service:  In process, will continue to follow  If discussed at Long Length of Stay Meetings, dates discussed:    Additional Comments:  [name], [name], RN 07/30/2017, 8:02 AM

## 2017-07-30 NOTE — Progress Notes (Signed)
RT took patient off BIPAP for a break. RT will continue to monitor. Patient on a 5 liter nasal cannula.

## 2017-07-30 NOTE — Progress Notes (Signed)
Pt admitted from the floor for new Bipap. Pt slightly responsive but dose not answer to many questions doue to SOB at this time. Connected to monitor and placed on bipap at 40% fio2, will follow up ABG in 30 minutes. BP elevated and notified triad. Awaiting orders

## 2017-07-30 NOTE — Progress Notes (Signed)
Pt received vasotec per md orders, post bipap abg was virtually same as before bipap, paged md and awainting any further orders about settings changes. Pt prefers to sit straight up in bed to assist with her breathing

## 2017-07-31 ENCOUNTER — Other Ambulatory Visit: Payer: Self-pay | Admitting: Physician Assistant

## 2017-07-31 ENCOUNTER — Inpatient Hospital Stay (HOSPITAL_COMMUNITY): Payer: Medicare Other

## 2017-07-31 LAB — BASIC METABOLIC PANEL
BUN: 13 mg/dL (ref 6–20)
CALCIUM: 8.1 mg/dL — AB (ref 8.9–10.3)
CO2: 45 mmol/L — ABNORMAL HIGH (ref 22–32)
Chloride: <65 mmol/L — CL (ref 101–111)
Creatinine, Ser: 0.67 mg/dL (ref 0.44–1.00)
GFR calc Af Amer: >60 mL/min (ref >60)
GFR calc non Af Amer: >60 mL/min (ref >60)
Glucose, Bld: 88 mg/dL (ref 65–99)
POTASSIUM: 3.5 mmol/L (ref 3.5–5.1)
Sodium: 123 mmol/L — ABNORMAL LOW (ref 135–145)

## 2017-07-31 LAB — CBC
HCT: 26.9 % — ABNORMAL LOW (ref 36.0–46.0)
Hemoglobin: 9.1 g/dL — ABNORMAL LOW (ref 12.0–15.0)
MCH: 29.3 pg (ref 26.0–34.0)
MCHC: 33.8 g/dL (ref 30.0–36.0)
MCV: 86.5 fL (ref 78.0–100.0)
PLATELETS: 158 10*3/uL (ref 150–400)
RBC: 3.11 MIL/uL — AB (ref 3.87–5.11)
RDW: 13.7 % (ref 11.5–15.5)
WBC: 2.3 10*3/uL — ABNORMAL LOW (ref 4.0–10.5)

## 2017-07-31 LAB — GLUCOSE, CAPILLARY
GLUCOSE-CAPILLARY: 92 mg/dL (ref 65–99)
GLUCOSE-CAPILLARY: 145 mg/dL — AB (ref 65–99)
GLUCOSE-CAPILLARY: 139 mg/dL — AB (ref 65–99)
Glucose-Capillary: 121 mg/dL — ABNORMAL HIGH (ref 65–99)
Glucose-Capillary: 157 mg/dL — ABNORMAL HIGH (ref 65–99)

## 2017-07-31 LAB — MAGNESIUM: MAGNESIUM: 1.3 mg/dL — AB (ref 1.7–2.4)

## 2017-07-31 MED ORDER — IPRATROPIUM-ALBUTEROL 0.5-2.5 (3) MG/3ML IN SOLN
3.0000 mL | Freq: Three times a day (TID) | RESPIRATORY_TRACT | Status: DC
  Administered 2017-08-01 – 2017-08-03 (×8): 3 mL via RESPIRATORY_TRACT
  Filled 2017-07-31 (×8): qty 3

## 2017-07-31 MED ORDER — MAGNESIUM SULFATE 2 GM/50ML IV SOLN
2.0000 g | Freq: Once | INTRAVENOUS | Status: AC
  Administered 2017-07-31: 2 g via INTRAVENOUS
  Filled 2017-07-31: qty 50

## 2017-07-31 MED ORDER — POTASSIUM CHLORIDE CRYS ER 20 MEQ PO TBCR
40.0000 meq | EXTENDED_RELEASE_TABLET | Freq: Once | ORAL | Status: AC
  Administered 2017-08-01: 40 meq via ORAL
  Filled 2017-07-31: qty 2

## 2017-07-31 MED ORDER — POTASSIUM CHLORIDE 20 MEQ/15ML (10%) PO SOLN
40.0000 meq | Freq: Two times a day (BID) | ORAL | Status: AC
  Administered 2017-07-31: 40 meq via ORAL
  Filled 2017-07-31 (×3): qty 30

## 2017-07-31 NOTE — Progress Notes (Signed)
Patient taken off BIPAP. Patient is wide awake. RT will continue to monitor

## 2017-07-31 NOTE — Progress Notes (Signed)
 @IPLOG @        PROGRESS NOTE                                                                                                                                                                                                             Patient Demographics:    Samantha Dyer, is a 66 y.o. female, DOB - 1951-07-16, ZOX:[id number]  Admit date - 07/26/2017   Admitting Physician Ankit Joline Maxcyhirag Amin, MD  Outpatient Primary MD for the patient is Louisa SecondForcucci, Courtney, PA-C  LOS - 4  Chief Complaint  Patient presents with  . Shortness of Breath    Hyponatremia        Brief Narrative  66 year old female with history of CAD status post CABG in October 2018 dictated by post CABG pleural effusion, status post thoracentesis which showed exudate but procedure was complicated by a small pneumothorax, advanced COPD on home oxygen 2 L, GERD, iron deficiency anemia, chronic diastolic CHF who was admitted to the hospital with severe shortness of breath and hyponatremia.  She was transferred to my care on 07/30/2017, at that time patient was extremely short of breath on BiPAP and in stepdown unit.    Subjective:   Patient in bed, appears comfortable, denies any headache, no fever, no chest pain or pressure, much improved orthopnea and shortness of breath , no abdominal pain. No focal weakness..   Assessment  & Plan :     1.  Acute on chronic severe hypoxic and hypercapnic respiratory failure caused by acute on chronic diastolic CHF EF around 55-60% on recent echocardiogram.  She was placed on BiPAP on 07/30/2017, was given Nitropaste along with IV Lasix and Zaroxolyn, placed on salt and fluid restriction.  She has diuresed 3.5 L since yesterday with excellent results, edema and shortness of breath much improved, she is now down to 3 L nasal cannula oxygen which is close to her baseline of 2 L nasal cannula.  Clinically CHF is much improved.  For now further diuretics will be held until nephrology sees the  patient due to persistent hyponatremia.  Pulmonary also on board.  Son and husband updated bedside 07/30/2017.  2.  History of recurrent pleural effusion exudative.  Initially post CABG a few weeks ago.  This seems to have reoccurred, will diurese and monitor, will defer long-term management to pulmonary.  Patient is known to pulmonary in their office.  3.  Advanced COPD underlying.  Uses 2 L nasal cannula oxygen at home.  Plan as in #1 above.  Do  not think she has exacerbation but worth giving a short trial of IV steroids.  4.  Hyponatremia.  She had orthopnea, shortness of breath and 1-2+ edema upon my exam on 07/30/2017, her urine electrolytes are intermediate however she was on home diuretic as well hence they are not most reliable, for her first 3 days of hospital stay she was hydrated without much benefit, she was diuresed on 07/30/2017 again without much improvement in hyponatremia plus her chloride has fallen further.  I think she has combination of intravascular depletion with third spacing of fluid causing edema, pulmonary edema and respiratory insufficiency but with low intravascular volume.  I have requested renal to evaluate her today to guide her with her fluid and diuretic management.   5.  CAD.  Status post recent CABG.  Continue her on combination of aspirin, beta-blocker and statin for secondary prevention no acute issues.  6.  Dyslipidemia.  On statin continue.  7.  Paroxysmal atrial fibrillation.  Chads vas 2 score of at least 3.  Currently in sinus, continue beta-blocker, amiodarone, aspirin.  Thereafter outpatient follow-up with cardiology.  Defer long-term anticoagulation to primary cardiologist based on her risks and benefits.  8.  History of chronic iron deficiency anemia.  Stable monitor with diuresis.    Diet : Diet Heart Room service appropriate? Yes; Fluid consistency: Thin   Family Communication  :  Son over the phone 07-30-17  Code Status :  Full  Disposition  Plan  :  TBD  Consults  :  PCCM, nephrology  Procedures  :    Echocardiogram November 2018.  Left ventricle: The cavity size was normal. Wall thickness was increased in a pattern of moderate LVH. Systolic function was normal. The estimated ejection fraction was in the range of 55% to 60%. Wall motion was normal; there were no regional wall motion abnormalities. Features are consistent with a pseudonormal left ventricular filling pattern, with concomitant abnormal relaxation and increased filling pressure (grade 2 diastolic dysfunction). Doppler parameters are consistent with high  ventricular filling pressure. - Aortic valve: Transvalvular velocity was within the normal range.  There was no stenosis. There was no regurgitation. - Mitral valve: Calcified annulus. Transvalvular velocity was  within the normal range. There was no evidence for stenosis. There was trivial regurgitation. Valve area by pressure   half-time: 2.14 cm^2. Valve area by continuity equation (using LVOT flow): 1.53 cm^2. - Left atrium: The atrium was mildly dilated. - Right ventricle: The cavity size was normal. Wall thickness was normal. Systolic function was normal. - Tricuspid valve: There was mild regurgitation. - Pulmonary arteries: Systolic pressure was mildly increased. PA  peak pressure: 40 mm Hg (S).  DVT Prophylaxis  :  Heparin    Lab Results  Component Value Date   PLT 158 07/31/2017    Inpatient Medications  Scheduled Meds: . amiodarone  400 mg Oral Daily  . aspirin EC  81 mg Oral Daily  . atorvastatin  80 mg Oral q1800  . azithromycin  250 mg Oral QHS  . ferrous gluconate  324 mg Oral BID WC  . fluticasone furoate-vilanterol  1 puff Inhalation Daily  . heparin injection (subcutaneous)  5,000 Units Subcutaneous Q8H  . insulin aspart  0-15 Units Subcutaneous TID WC  . ipratropium-albuterol  3 mL Nebulization Q6H  . mouth rinse  15 mL Mouth Rinse BID  . methylPREDNISolone (SOLU-MEDROL) injection  60  mg Intravenous Q12H  . metoprolol tartrate  25 mg Oral BID  . nitroGLYCERIN  0.5 inch Topical Q6H  . potassium chloride  40 mEq Oral BID   Continuous Infusions: PRN Meds:.acetaminophen, ipratropium-albuterol  Antibiotics  :    Anti-infectives (From admission, onward)   Start     Dose/Rate Route Frequency Ordered Stop   07/30/17 2200  azithromycin (ZITHROMAX) tablet 250 mg     250 mg Oral Daily at bedtime 07/29/17 2132 08/03/17 2159   07/29/17 2145  azithromycin (ZITHROMAX) tablet 500 mg     500 mg Oral Daily 07/29/17 2132 07/29/17 2230         Objective:   Vitals:   07/31/17 0500 07/31/17 0600 07/31/17 0720 07/31/17 0748  BP: (!) 154/54 (!) 132/39    Pulse: 84 82    Resp: 18 13    Temp:   98.4 F (36.9 C)   TempSrc:   Oral   SpO2: 96% 98%  98%  Weight: 57.1 kg (125 lb 14.1 oz)     Height:        Wt Readings from Last 3 Encounters:  07/31/17 57.1 kg (125 lb 14.1 oz)  07/05/17 58.4 kg (128 lb 12.8 oz)  06/25/17 58.1 kg (128 lb)     Intake/Output Summary (Last 24 hours) at 07/31/2017 0943 Last data filed at 07/31/2017 0500 Gross per 24 hour  Intake 240 ml  Output 3500 ml  Net -3260 ml     Physical Exam  Awake Alert, Oriented X 3, No new F.N deficits, Normal affect [place].AT,PERRAL Supple Neck, elevated JVD but less than before, No cervical lymphadenopathy appriciated.  Symmetrical Chest wall movement, Good air movement bilaterally, few rales RRR,No Gallops, Rubs or new Murmurs, No Parasternal Heave +ve B.Sounds, Abd Soft, No tenderness, No organomegaly appriciated, No rebound - guarding or rigidity. No Cyanosis, Clubbing, trace and much improved bilateral lower extremity edema, No new Rash or bruise      Data Review:    CBC Recent Labs  Lab 07/26/17 2051 07/27/17 0524 07/30/17 0804 07/31/17 0246  WBC 6.4 4.5 8.1 2.3*  HGB 10.1* 9.5* 10.9* 9.1*  HCT 30.1* 28.3* 31.8* 26.9*  PLT 178 178 158 158  MCV 87.0 86.3 86.9 86.5  MCH 29.2 29.0 29.8 29.3   MCHC 33.6 33.6 34.3 33.8  RDW 13.9 14.0 14.0 13.7  LYMPHSABS 0.3*  --   --   --   MONOABS 0.3  --   --   --   EOSABS 0.1  --   --   --   BASOSABS 0.0  --   --   --     Chemistries  Recent Labs  Lab 07/27/17 0524 07/27/17 1552 07/28/17 0450 07/29/17 0410 07/30/17 0503 07/31/17 0246  NA 120* 120* 121* 122* 124* 123*  K 4.0 3.3* 3.0* 3.6 4.4 3.5  CL 69* <65* 66* 67* 72* <65*  CO2 42* 43* 46* 44* 45* 45*  GLUCOSE 92 107* 85 83 99 88  BUN 6 7 6 8 7 13   CREATININE 0.51 0.52 0.46 0.53 0.47 0.67  CALCIUM 8.4* 8.2* 7.9* 8.1* 8.8* 8.1*  MG  --   --   --   --   --  1.3*  AST 22  --   --   --   --   --   ALT 17  --   --   --   --   --   ALKPHOS 93  --   --   --   --   --   BILITOT 1.1  --   --   --   --   --    ------------------------------------------------------------------------------------------------------------------  No results for input(s): CHOL, HDL, LDLCALC, TRIG, CHOLHDL, LDLDIRECT in the last 72 hours.  No results found for: HGBA1C ------------------------------------------------------------------------------------------------------------------ No results for input(s): TSH, T4TOTAL, T3FREE, THYROIDAB in the last 72 hours.  Invalid input(s): FREET3 ------------------------------------------------------------------------------------------------------------------ No results for input(s): VITAMINB12, FOLATE, FERRITIN, TIBC, IRON, RETICCTPCT in the last 72 hours.  Coagulation profile No results for input(s): INR, PROTIME in the last 168 hours.  No results for input(s): DDIMER in the last 72 hours.  Cardiac Enzymes No results for input(s): CKMB, TROPONINI, MYOGLOBIN in the last 168 hours.  Invalid input(s): CK ------------------------------------------------------------------------------------------------------------------    Component Value Date/Time   BNP 1,269.0 (H) 07/30/2017 0804    Micro Results Recent Results (from the past 240 hour(s))  MRSA PCR  Screening     Status: None   Collection Time: 07/30/17  6:35 AM  Result Value Ref Range Status   MRSA by PCR NEGATIVE NEGATIVE Final    Comment:        The GeneXpert MRSA Assay (FDA approved for NASAL specimens only), is one component of a comprehensive MRSA colonization surveillance program. It is not intended to diagnose MRSA infection nor to guide or monitor treatment for MRSA infections.     Radiology Reports Dg Chest 2 View  Result Date: 07/26/2017 CLINICAL DATA:  Shortness of breath for 1 month EXAM: CHEST  2 VIEW COMPARISON:  07/24/2017 FINDINGS: Cardiac shadow is stable. Postoperative changes are again seen. Right-sided pleural effusion is noted slightly greater than that seen on the prior exam. Mild vascular congestion is noted new from the prior study. No other focal abnormality is seen. IMPRESSION: Slight increase in right-sided pleural effusion. Vascular congestion. Electronically Signed   By: Alcide Clever M.D.   On: 07/26/2017 21:00   Dg Chest Port 1 View  Result Date: 07/31/2017 CLINICAL DATA:  Shortness of Breath EXAM: PORTABLE CHEST 1 VIEW COMPARISON:  July 29, 2017 FINDINGS: There is a persistent right pleural effusion with right base consolidation. There is persistent atelectatic change in the left base. Heart size is within normal limits. The pulmonary vascularity is within normal limits. No adenopathy is appreciable. There is aortic atherosclerosis. Patient is status post coronary artery bypass grafting. No bone lesions evident. IMPRESSION: Persistent right pleural effusion. Consolidation right lower lung zone, slightly increased. Atelectatic change noted left base. Stable cardiac silhouette. There is aortic atherosclerosis. Aortic Atherosclerosis (ICD10-I70.0). Electronically Signed   By: Bretta Bang III M.D.   On: 07/31/2017 08:13   Dg Chest Port 1 View  Result Date: 07/29/2017 CLINICAL DATA:  Shortness of breath.  History of COPD. EXAM: PORTABLE CHEST 1  VIEW COMPARISON:  Chest radiograph July 26, 2017 FINDINGS: Stable cardiomegaly para calcified aortic knob. Status post median sternotomy for CABG. Similar pulmonary vascular congestion with mild interstitial prominence. Slightly increased moderate RIGHT pleural effusion with juxta peaking. RIGHT lung base consolidation with air bronchograms. Strandy densities LEFT lung base compatible with atelectasis. No pneumothorax. Soft tissue planes and included osseous structures are unchanged. Surgical clips in the included right abdomen compatible with cholecystectomy. Surgical clips in LEFT abdomen. Calcifications in neck are likely vascular. IMPRESSION: Increasing RIGHT pleural effusion with RIGHT lower lobe consolidation concerning for pneumonia. Stable cardiomegaly, interstitial prominence seen with pulmonary edema or atypical infection. Aortic Atherosclerosis (ICD10-I70.0). Electronically Signed   By: Awilda Metro M.D.   On: 07/29/2017 20:53   Dg Chest Port 1 View  Result Date: 07/04/2017 CLINICAL DATA:  Respiratory failure EXAM: PORTABLE CHEST 1 VIEW COMPARISON:  July 03, 2017 FINDINGS: Stable hydropneumothorax on the right without tension component. There is a small pleural effusion on the left as well. There is patchy consolidation in both lung bases, stable. There is no evident new opacity. Heart is upper normal in size with pulmonary vascularity within normal limits. There is aortic atherosclerosis. No bone lesions. There is carotid artery calcification on the left. IMPRESSION: Stable hydropneumothorax on the right without tension component. Small left pleural effusion. Bibasilar patchy consolidation. Stable cardiac silhouette. Aortic atherosclerosis as well as calcification in the left carotid artery noted. Aortic Atherosclerosis (ICD10-I70.0). Electronically Signed   By: Bretta BangWilliam  Woodruff III M.D.   On: 07/04/2017 07:30   Dg Chest Port 1 View  Result Date: 07/03/2017 CLINICAL DATA:   Shortness of breath. Known right-sided hydropneumothorax. EXAM: PORTABLE CHEST 1 VIEW COMPARISON:  Chest x-ray of July 02, 2017. FINDINGS: The remains a right-sided hydropneumothorax. The pleural line remains between the fourth and fifth posterior right rib interfaces. On the left there is a small pleural effusion but no pneumothorax. There is no mediastinal shift. Bibasilar densities persist consistent with atelectasis or pneumonia. The heart border is largely obscured. There is prominence of the central pulmonary vascularity. There are post CABG changes. There is calcification in the wall of the aortic arch. IMPRESSION: Persistent right-sided hydropneumothorax and small left pleural effusion. Bibasilar atelectasis or pneumonia. There has not been significant change since yesterday's study. Cardiomegaly with central pulmonary vascular congestion, stable. Previous CABG. Thoracic aortic atherosclerosis. Electronically Signed   By: David  SwazilandJordan M.D.   On: 07/03/2017 07:58   Dg Chest Port 1 View  Result Date: 07/02/2017 CLINICAL DATA:  66 year old female with pneumothorax. Prior thoracentesis. Subsequent encounter. EXAM: PORTABLE CHEST 1 VIEW COMPARISON:  06/30/2017 chest x-ray. FINDINGS: Moderately large right-sided hydropneumothorax. This may represent pneumothorax ex vacuo with minimal decrease in size of pneumothorax component although increase in size of pleural effusion component. Consolidation right mid lower lobe may represent atelectasis or infiltrate. Underlying mass not excluded. Mild pulmonary vascular prominence. Small left-sided pleural effusion/ left base atelectasis. Post CABG.  Cardiomegaly. Calcified aorta. IMPRESSION: Moderately large right-sided hydropneumothorax. This may represent pneumothorax ex vacuo with minimal decrease in size of pneumothorax component although increase in size of pleural effusion component. Consolidation right mid lower lobe may represent atelectasis or infiltrate.  Mild pulmonary vascular prominence. Small left-sided pleural effusion/ left base atelectasis. Cardiomegaly post CABG. Aortic Atherosclerosis (ICD10-I70.0). Electronically Signed   By: Lacy DuverneySteven  Olson M.D.   On: 07/02/2017 07:36    Time Spent in minutes  30   Susa RaringPrashant Nasirah Sachs M.D on 07/31/2017 at 9:43 AM  Between 7am to 7pm - Pager - [phone] ( page via [email or web address], text pages only, please mention full 10 digit call back number). After 7pm go to [email or web address] - password [email or web address]

## 2017-07-31 NOTE — Progress Notes (Signed)
CRITICAL VALUE ALERT  Critical Value:  Cl-  <65  Date & Time Notied:  07/31/17 0411am  Provider Notified: paged on call md  Orders Received/Actions taken: awaiting orders

## 2017-07-31 NOTE — Consult Note (Signed)
Renal Service Consult Note [facility]  Samantha Dyer 07/31/2017 [place] Requesting Physician:  Dr Candiss Norse  Reason for Consult:  Hyponatremia HPI: The patient is a 67 y.o. year-old with long hx of stage IV COPD, tobacco use, also anemia, had CABG in Oct 2018.  She was admitted here for fatigue and electrolyte imbalances w hyponatremia and Na 120's last month, had R thoracentesis and was diuresed which improved the serum Na from 116 to 128.   Now pt here w/ SOB, recurrent R effusion > L , LE edema and serum Na 120.  Has gotten some IV lasix since admission and the Na is 123 today.  Creat and UA are normal.  No proteinuria.  ECHO's have shown diast dysfunction, normal RV's.  Serum HCO3 level was 42 on admission and is up to 45 today, serum chloride was 69 on admission and is < 65 today.  BUN 13, creat 0.67.   Patient is up in the chair, minimally interactive, answers questions reluctantly.    Denies any hx of kidney failure, nsaids.  No cough , CP , +leg edema  ROS  denies CP  no joint pain   no HA  no blurry vision  no rash  no diarrhea  no nausea/ vomiting  no dysuria  no difficulty voiding  no change in urine color    Past Medical History  Past Medical History:  Diagnosis Date  . Allergic rhinitis, cause unspecified   . Anemia, unspecified   . Backache, unspecified   . CAD (coronary artery disease)    a. cath on 06/01/17 showing severe 3-vessel CAD --> s/p CABG on 06/06/2017 with LIMA-LAD, SVG-PDA, and Seq SVG-RI1-RI2.  Marland Kitchen COPD (chronic obstructive pulmonary disease) ([place])   . Esophageal reflux   . Iron deficiency anemia, unspecified    Past Surgical History  Past Surgical History:  Procedure Laterality Date  . CORONARY ARTERY BYPASS GRAFT N/A 06/06/2017   Procedure: CORONARY ARTERY BYPASS GRAFTING x 4 USING LEFT INTERNAL MAMMARY ARTERY AND RIGHT GREATER SAPHENOUS VEIN HARVESTED ENDOSCOPICALLY -LIMA to LAD -SVG to PDA -SEQ SVG to RAMUS 1 and RAMUS  2;  Surgeon: Melrose Nakayama, MD;  Location: [place];  Service: Open Heart Surgery;  Laterality: N/A;  . RIGHT/LEFT HEART CATH AND CORONARY ANGIOGRAPHY N/A 06/01/2017   Procedure: RIGHT/LEFT HEART CATH AND CORONARY ANGIOGRAPHY;  Surgeon: Jettie Booze, MD;  Location: [facility];  Service: Cardiovascular;  Laterality: N/A;  . TEE WITHOUT CARDIOVERSION N/A 06/06/2017   Procedure: TRANSESOPHAGEAL ECHOCARDIOGRAM (TEE);  Surgeon: Melrose Nakayama, MD;  Location: [place];  Service: Open Heart Surgery;  Laterality: N/A;   Family History  Family History  Problem Relation Age of Onset  . Emphysema Mother   . Heart disease Mother   . Breast cancer Mother   . Pancreatic cancer Father    Social History  reports that she has been smoking cigarettes.  She has a 43.00 pack-year smoking history. she has never used smokeless tobacco. She reports that she does not drink alcohol or use drugs. Allergies  Allergies  Allergen Reactions  . Amoxicillin-Pot Clavulanate Swelling  . Augmentin [Amoxicillin-Pot Clavulanate] Swelling   Home medications Prior to Admission medications   Medication Sig Start Date End Date Taking? Authorizing Provider  acetaminophen (TYLENOL) 325 MG tablet Take 650 mg by mouth every 6 (six) hours as needed for mild pain.   Yes [provider]  amiodarone (PACERONE) 400 MG tablet Take 1 tablet (400  mg total) daily by mouth. 06/25/17  Yes Strader, Fransisco Hertz, PA-C  aspirin EC 81 MG EC tablet Take 1 tablet (81 mg total) by mouth daily. 06/14/17  Yes Barrett, Erin R, PA-C  atorvastatin (LIPITOR) 80 MG tablet Take 1 tablet (80 mg total) by mouth daily at 6 PM. 06/14/17  Yes Barrett, Erin R, PA-C  COMBIVENT RESPIMAT 20-100 MCG/ACT AERS respimat Inhale 1 puff 4 (four) times daily as needed into the lungs for wheezing or shortness of breath. 07/05/17  Yes Cherene Altes, MD  ferrous gluconate (FERGON) 324 MG tablet Take 1 tablet (324 mg total) by mouth 2 (two)  times daily with a meal. 06/14/17  Yes Barrett, Erin R, PA-C  fluticasone furoate-vilanterol (BREO ELLIPTA) 200-25 MCG/INH AEPB Inhale 1 puff into the lungs daily. 09/21/16  Yes Parrett, Fonnie Mu, NP  furosemide (LASIX) 80 MG tablet Take 0.5 tablets (40 mg total) 2 (two) times daily by mouth. 07/05/17  Yes McClung, Kimberlee Nearing, MD  metolazone (ZAROXOLYN) 5 MG tablet Take 5 mg daily by mouth.   Yes [provider]  metoprolol tartrate (LOPRESSOR) 25 MG tablet Take 1 tablet (25 mg total) by mouth 2 (two) times daily. 06/14/17  Yes Barrett, Erin R, PA-C  OXYGEN Inhale 2 L into the lungs continuous.   Yes [provider]  potassium chloride SA (K-DUR,KLOR-CON) 20 MEQ tablet Take 1 tablet (20 mEq total) daily by mouth. Patient taking differently: Take 10 mEq by mouth daily.  07/05/17  Yes Cherene Altes, MD  tiotropium (SPIRIVA HANDIHALER) 18 MCG inhalation capsule inhale contents of one capsule once daily 03/15/16  Yes [provider]  traMADol (ULTRAM) 50 MG tablet Take 1 tablet (50 mg total) by mouth every 4 (four) hours as needed for moderate pain. Patient not taking: Reported on 07/26/2017 06/13/17   Barrett, Lodema Hong, PA-C   Liver Function Tests Recent Labs  Lab 07/27/17 0524  AST 22  ALT 17  ALKPHOS 93  BILITOT 1.1  PROT 5.3*  ALBUMIN 3.3*   No results for input(s): LIPASE, AMYLASE in the last 168 hours. CBC Recent Labs  Lab 07/26/17 2051 07/27/17 0524 07/30/17 0804 07/31/17 0246  WBC 6.4 4.5 8.1 2.3*  NEUTROABS 5.7  --   --   --   HGB 10.1* 9.5* 10.9* 9.1*  HCT 30.1* 28.3* 31.8* 26.9*  MCV 87.0 86.3 86.9 86.5  PLT 178 178 158 092   Basic Metabolic Panel Recent Labs  Lab 07/26/17 2051 07/27/17 0524 07/27/17 1552 07/28/17 0450 07/29/17 0410 07/30/17 0503 07/31/17 0246  NA 120* 120* 120* 121* 122* 124* 123*  K 3.7 4.0 3.3* 3.0* 3.6 4.4 3.5  CL 66* 69* <65* 66* 67* 72* <65*  CO2 44* 42* 43* 46* 44* 45* 45*  GLUCOSE 119* 92 107* 85 83 99 88   BUN _0 CREATININE 0.53 0.51 0.52 0.46 0.53 0.47 0.67  CALCIUM 8.5* 8.4* 8.2* 7.9* 8.1* 8.8* 8.1*   Iron/TIBC/Ferritin/ %Sat No results found for: IRON, TIBC, FERRITIN, IRONPCTSAT  Vitals:   07/31/17 1135 07/31/17 1200 07/31/17 1400 07/31/17 1409  BP:  (!) 131/50 (!) 175/115 (!) 157/58  Pulse:  82 80 80  Resp:  18 (!) 22 20  Temp: 97.9 F (36.6 C)     TempSrc: Oral     SpO2:  93% 95% 94%  Weight:      Height:       Exam Gen pleasant WF no distress, up  in chair No rash, cyanosis or gangrene Sclera anicteric, throat clear  No jvd or bruits Chest dec'd BS 1/2 up on R, 1/3 on L w/ rales L base  RRR no MRG, sternal scar healing Abd soft ntnd no mass or ascites +bs GU defer w foley in place MS no joint effusions or deformity Ext 1-2+ bilat LE edema / no wounds or ulcers Neuro is alert, Ox 3 , nf   UA 06/26/17 > negative UNa 43 on 12/7 UCr 119 on 12/7 Admit wt 60.7kg, today's wt 57.1kg   Net I/O since admit 12/7 >> 3.5 L in and 7 L out   CT angio 06/26/17  > bilat effusions/ comp atx R> L, no PE  Na 123  K 3.5   Cl <65  CO2 45   BUN 13  Cr 0.67   Ca 8.1  Mg 1.3   ABG   CXR 12/6 > Right-sided pleural effusion is noted slightly greater than that seen on the prior exam. Mild vascular congestion  CXR 07/29/17 > Increasing RIGHT pleural effusion with RIGHT lower lobe consolidation concerning for pneumonia. Stable cardiomegaly, interstitial prominence seen with pulmonary edema or atypical infection. CXR 07/31/17 > There is a persistent right pleural effusion with right base consolidation. There is persistent atelectatic change in the left base. Heart size is within normal limits. The pulmonary vascularity is within normal limits   Impression: 1. Severe hyponatremia, hypervolemic - diastolic CHF flare but situation is complicated by underlying severe COPD with CO2 retention w/ chronic secondary compensatory metabolic alkalosis.  Giving lasix is causing worsening met  alkalosis by causing intravasc vol depletion.  Dont' see any easy solution to this.  Severe recurrent hyponatremia in setting heart failure/ hypervolemia is a bad prognostic sign.  Also the COPD appears to be worsening with higher pCO2's lately.  This may be a manifestation of end-stage lung disease.  Would continue lasix at low - medium doses, no other suggestions.  Consider pulm and/ or palliative care input.     Rec - as above.   Kelly Splinter MD [employer] pager [phone]   07/31/2017, 3:05 PM

## 2017-08-01 DIAGNOSIS — R338 Other retention of urine: Secondary | ICD-10-CM | POA: Clinically undetermined

## 2017-08-01 DIAGNOSIS — J9621 Acute and chronic respiratory failure with hypoxia: Secondary | ICD-10-CM

## 2017-08-01 DIAGNOSIS — I5033 Acute on chronic diastolic (congestive) heart failure: Secondary | ICD-10-CM

## 2017-08-01 DIAGNOSIS — J9622 Acute and chronic respiratory failure with hypercapnia: Secondary | ICD-10-CM

## 2017-08-01 DIAGNOSIS — Z7189 Other specified counseling: Secondary | ICD-10-CM

## 2017-08-01 DIAGNOSIS — J449 Chronic obstructive pulmonary disease, unspecified: Secondary | ICD-10-CM

## 2017-08-01 DIAGNOSIS — Z515 Encounter for palliative care: Secondary | ICD-10-CM

## 2017-08-01 DIAGNOSIS — E871 Hypo-osmolality and hyponatremia: Secondary | ICD-10-CM

## 2017-08-01 DIAGNOSIS — I2510 Atherosclerotic heart disease of native coronary artery without angina pectoris: Secondary | ICD-10-CM

## 2017-08-01 DIAGNOSIS — Z66 Do not resuscitate: Secondary | ICD-10-CM

## 2017-08-01 DIAGNOSIS — I255 Ischemic cardiomyopathy: Secondary | ICD-10-CM

## 2017-08-01 DIAGNOSIS — J90 Pleural effusion, not elsewhere classified: Secondary | ICD-10-CM

## 2017-08-01 DIAGNOSIS — Z951 Presence of aortocoronary bypass graft: Secondary | ICD-10-CM

## 2017-08-01 LAB — BASIC METABOLIC PANEL
BUN: 23 mg/dL — ABNORMAL HIGH (ref 6–20)
CO2: 48 mmol/L — AB (ref 22–32)
Calcium: 8.2 mg/dL — ABNORMAL LOW (ref 8.9–10.3)
Creatinine, Ser: 0.82 mg/dL (ref 0.44–1.00)
GFR calc non Af Amer: >60 mL/min (ref >60)
Glucose, Bld: 136 mg/dL — ABNORMAL HIGH (ref 65–99)
POTASSIUM: 4.1 mmol/L (ref 3.5–5.1)
Sodium: 121 mmol/L — ABNORMAL LOW (ref 135–145)

## 2017-08-01 LAB — CBC
HCT: 24.9 % — ABNORMAL LOW (ref 36.0–46.0)
Hemoglobin: 8.5 g/dL — ABNORMAL LOW (ref 12.0–15.0)
MCH: 29.3 pg (ref 26.0–34.0)
MCHC: 34.1 g/dL (ref 30.0–36.0)
MCV: 85.9 fL (ref 78.0–100.0)
PLATELETS: 153 10*3/uL (ref 150–400)
RBC: 2.9 MIL/uL — ABNORMAL LOW (ref 3.87–5.11)
RDW: 13.5 % (ref 11.5–15.5)
WBC: 4.5 10*3/uL (ref 4.0–10.5)

## 2017-08-01 LAB — BLOOD GAS, ARTERIAL
Acid-Base Excess: 23.9 mmol/L — ABNORMAL HIGH (ref 0.0–2.0)
BICARBONATE: 52 mmol/L — AB (ref 20.0–28.0)
Drawn by: 257701
O2 CONTENT: 2 L/min
O2 SAT: 93 %
PATIENT TEMPERATURE: 98.6
PO2 ART: 68.2 mmHg — AB (ref 83.0–108.0)
pCO2 arterial: 78.3 mmHg (ref 32.0–48.0)
pH, Arterial: 7.437 (ref 7.350–7.450)

## 2017-08-01 LAB — GLUCOSE, CAPILLARY
GLUCOSE-CAPILLARY: 124 mg/dL — AB (ref 65–99)
GLUCOSE-CAPILLARY: 101 mg/dL — AB (ref 65–99)
GLUCOSE-CAPILLARY: 157 mg/dL — AB (ref 65–99)

## 2017-08-01 LAB — MAGNESIUM: Magnesium: 1.7 mg/dL (ref 1.7–2.4)

## 2017-08-01 MED ORDER — FUROSEMIDE 40 MG PO TABS
40.0000 mg | ORAL_TABLET | Freq: Two times a day (BID) | ORAL | Status: DC
  Administered 2017-08-01 – 2017-08-03 (×5): 40 mg via ORAL
  Filled 2017-08-01 (×4): qty 1

## 2017-08-01 MED ORDER — METHYLPREDNISOLONE SODIUM SUCC 125 MG IJ SOLR
60.0000 mg | Freq: Every day | INTRAMUSCULAR | Status: DC
  Administered 2017-08-01 – 2017-08-02 (×2): 60 mg via INTRAVENOUS
  Filled 2017-08-01: qty 2

## 2017-08-01 MED ORDER — MORPHINE SULFATE (CONCENTRATE) 10 MG/0.5ML PO SOLN: 2.5000 mg | ORAL | Status: DC | PRN

## 2017-08-01 MED ORDER — TAMSULOSIN HCL 0.4 MG PO CAPS
0.4000 mg | ORAL_CAPSULE | Freq: Every day | ORAL | Status: DC
  Administered 2017-08-01 – 2017-08-03 (×3): 0.4 mg via ORAL
  Filled 2017-08-01 (×2): qty 1

## 2017-08-01 MED ORDER — MAGNESIUM SULFATE 4 GM/100ML IV SOLN
4.0000 g | Freq: Once | INTRAVENOUS | Status: AC
  Administered 2017-08-01: 4 g via INTRAVENOUS
  Filled 2017-08-01: qty 100

## 2017-08-01 NOTE — Progress Notes (Signed)
No charge note  PMT meeting scheduled with husband at 2:30 pm 12/12.  Samantha RichardsMarianne Turner Kunzman, PA-C Palliative Medicine Pager: [phone]

## 2017-08-01 NOTE — Progress Notes (Signed)
PROGRESS NOTE    Samantha Dyer  ONG:[id number] DOB: 1950/11/22 DOA: 07/26/2017 PCP: Terri Piedra, PA-C (Confirm with patient/family/NH records and if not entered, this HAS to be entered at [facility] point of entry. "No PCP" if truly none.)   Brief Narrative:  66 year old female with history of CAD status post CABG in October 2018 dictated by post CABG pleural effusion, status post thoracentesis which showed exudate but procedure was complicated by a small pneumothorax, advanced COPD on home oxygen 2 L, GERD, iron deficiency anemia, chronic diastolic CHF who was admitted to the hospital with severe shortness of breath and hyponatremia.  She was transferred to [facility] care on 07/30/2017, at that time patient was extremely short of breath on BiPAP and in stepdown unit.  Patient placed on IV diuretics as well as COPD management with improvement with her shortness of breath however patient with severe hyponatremia complicated by severe COPD with CO2 retention with chronic secondary compensatory metabolic acidosis.  Nephrology consulted who felt patient was a poor prognosis and recommended palliative care consultation.      Assessment & Plan:   Principal Problem:   Acute diastolic (congestive) heart failure (HCC) Active Problems:   COPD without exacerbation (HCC)   Hyponatremia   Acute on chronic respiratory failure with hypoxia and hypercapnia (HCC)   Ischemic cardiomyopathy   S/P CABG x 4   CAD (coronary artery disease)   Pleural effusion   Pressure injury of skin   Acute urinary retention  #1 acute on chronic severe hypoxic and hypercarbic respiratory failure secondary to acute on chronic diastolic CHF EF of 55-60% per recent 2D echo of 06/27/2017.  Patient was placed on a BiPAP on 07/30/2017 given Nitropaste along with IV Lasix and Zaroxolyn and placed on a salt and fluid restriction.  Patient with good diuresis with a urine output of 1.5-5 L over the past 24 hours and  -4.705 L during this hospitalization.  Current weight is 125 pounds from 133 pounds on admission.  Patient with clinical improvement.  O2 requirements now on 3 L nasal cannula and close to her baseline of 2 L nasal cannula.  Due to patient's persistent hyponatremia diuretics were held until patient was seen in consultation by nephrology.  Will resume Lasix at home regimen of 40 mg p.o. twice daily.  Follow.  2.  History of recurrent pleural right effusion exudative Initially noted post CABG a few weeks ago.  Reoccurred.  Patient being diuresed.  Long-term management has been deferred to pulmonary.  Patient follows with pulmonary in the outpatient setting.  3.  Advanced COPD Patient on 2 L nasal cannula at home.  See problem #1.  Patient with clinical improvement.  Patient unlikely having an exacerbation of his COPD.  Continue current recommended broncho-dilations per pulmonary.  Decrease Solu-Medrol to 60 mg IV daily.  Outpatient follow-up with pulmonary.  4.  Severe hyponatremia Patient noted to have orthopnea, shortness of breath in 1-2+ edema on 07/30/2017.  Patient severe hyponatremia felt to be secondary to hypervolemia due to acute CHF exacerbation which is complicated by underlying severe COPD with CO2 retention with chronic secondary compensatory metabolic alkalosis per nephrology.  It was felt given Lasix was likely worsening patient's metabolic alkalosis by causing intravascular volume depletion.  Per pulmonary poor prognosis as patient with severe recurrent hyponatremia in the setting of heart failure is a bad prognostic sign.  Patient also with worsening COPD with increasing PCO2's.  It is felt per pulmonary this might be a manifestation  of end-stage lung disease.  Will place patient back on home regimen of Lasix 40 mg twice daily.  Palliative care consultation pending.  5.  Coronary artery disease status post recent CABG Currently chest pain-free.  Continue home regimen of aspirin,  beta-blocker, statin.  Outpatient follow-up.  6.  Hyperlipidemia Continue statin.  7.  Paroxysmal atrial fibrillation CHA2DS2VASC score of at least 3.  Currently in normal sinus rhythm.  Continue beta-blocker, amiodarone and aspirin.  Long-term anticoagulation will be to defer to patient's primary cardiologist in the outpatient setting.  Outpatient follow-up.  8.  Chronic iron deficiency anemia H&H stable.  9.  Acute urinary retention Foley catheter has been placed.  We will add Flomax.  Voiding trial in about 2-3 days.   DVT prophylaxis: Heparin Code Status: Full Family Communication: Updated patient.  No family at bedside. Disposition Plan: To be determined.   Consultants:   Nephrology: Dr.Schertz 07/31/2017  PCCM: Dr. Kendrick FriesMcQuaid obtain 2018  Palliative care pending  Procedures:   Chest x-ray 07/26/2017, 07/29/2017, 07/31/2017    Antimicrobials:   Azithromycin 07/29/2017>>>>>> 08/03/2017   Subjective: Patient laying in bed.  Patient states improvement with shortness of breath.  Patient denies any chest pain.  Patient asking for diet to be changed to a regular diet otherwise she is going to starve and does not like current heart healthy diet.  Objective: Vitals:   08/01/17 0500 08/01/17 0600 08/01/17 0744 08/01/17 0800  BP:  (!) 142/80  (!) 147/55  Pulse: 76 77  79  Resp: 12 17  13   Temp:    98 F (36.7 C)  TempSrc:    Oral  SpO2: 100% 98% 96% 95%  Weight: 57.1 kg (125 lb 14.1 oz)     Height:        Intake/Output Summary (Last 24 hours) at 08/01/2017 0958 Last data filed at 08/01/2017 0700 Gross per 24 hour  Intake 340 ml  Output 1525 ml  Net -1185 ml   Filed Weights   07/30/17 0600 07/31/17 0500 08/01/17 0500  Weight: 60.3 kg (132 lb 15 oz) 57.1 kg (125 lb 14.1 oz) 57.1 kg (125 lb 14.1 oz)    Examination:  General exam: Appears calm and comfortable  Respiratory system: Minimal bibasilar crackles otherwise clear.  No wheezing.  No rhonchi.  Decreased  breath sounds in the right base.  Respiratory effort normal. Cardiovascular system: S1 & S2 heard, RRR. No JVD, murmurs, rubs, gallops or clicks.  1-2+ bilateral lower extremity edema. Gastrointestinal system: Abdomen is nondistended, soft and nontender. No organomegaly or masses felt. Normal bowel sounds heard. Central nervous system: Alert and oriented. No focal neurological deficits. Extremities: Symmetric 5 x 5 power. Skin: No rashes, lesions or ulcers Psychiatry: Judgement and insight appear normal. Mood & affect appropriate.     Data Reviewed: I have personally reviewed following labs and imaging studies  CBC: Recent Labs  Lab 07/26/17 2051 07/27/17 0524 07/30/17 0804 07/31/17 0246 08/01/17 0304  WBC 6.4 4.5 8.1 2.3* 4.5  NEUTROABS 5.7  --   --   --   --   HGB 10.1* 9.5* 10.9* 9.1* 8.5*  HCT 30.1* 28.3* 31.8* 26.9* 24.9*  MCV 87.0 86.3 86.9 86.5 85.9  PLT 178 178 158 158 153   Basic Metabolic Panel: Recent Labs  Lab 07/28/17 0450 07/29/17 0410 07/30/17 0503 07/31/17 0246 08/01/17 0304  NA 121* 122* 124* 123* 121*  K 3.0* 3.6 4.4 3.5 4.1  CL 66* 67* 72* <65* <65*  CO2 46*  44* 45* 45* 48*  GLUCOSE 85 83 99 88 136*  BUN 6 8 7 13  23*  CREATININE 0.46 0.53 0.47 0.67 0.82  CALCIUM 7.9* 8.1* 8.8* 8.1* 8.2*  MG  --   --   --  1.3* 1.7   GFR: Estimated Creatinine Clearance: 59.1 mL/min (by C-G formula based on SCr of 0.82 mg/dL). Liver Function Tests: Recent Labs  Lab 07/27/17 0524  AST 22  ALT 17  ALKPHOS 93  BILITOT 1.1  PROT 5.3*  ALBUMIN 3.3*   No results for input(s): LIPASE, AMYLASE in the last 168 hours. No results for input(s): AMMONIA in the last 168 hours. Coagulation Profile: No results for input(s): INR, PROTIME in the last 168 hours. Cardiac Enzymes: No results for input(s): CKTOTAL, CKMB, CKMBINDEX, TROPONINI in the last 168 hours. BNP (last 3 results) No results for input(s): PROBNP in the last 8760 hours. HbA1C: No results for input(s):  HGBA1C in the last 72 hours. CBG: Recent Labs  Lab 07/31/17 1154 07/31/17 1729 07/31/17 1902 07/31/17 2235 08/01/17 0755  GLUCAP 145* 139* 121* 157* 124*   Lipid Profile: No results for input(s): CHOL, HDL, LDLCALC, TRIG, CHOLHDL, LDLDIRECT in the last 72 hours. Thyroid Function Tests: No results for input(s): TSH, T4TOTAL, FREET4, T3FREE, THYROIDAB in the last 72 hours. Anemia Panel: No results for input(s): VITAMINB12, FOLATE, FERRITIN, TIBC, IRON, RETICCTPCT in the last 72 hours. Sepsis Labs: No results for input(s): PROCALCITON, LATICACIDVEN in the last 168 hours.  Recent Results (from the past 240 hour(s))  MRSA PCR Screening     Status: None   Collection Time: 07/30/17  6:35 AM  Result Value Ref Range Status   MRSA by PCR NEGATIVE NEGATIVE Final    Comment:        The GeneXpert MRSA Assay (FDA approved for NASAL specimens only), is one component of a comprehensive MRSA colonization surveillance program. It is not intended to diagnose MRSA infection nor to guide or monitor treatment for MRSA infections.          Radiology Studies: Dg Chest Port 1 View  Result Date: 07/31/2017 CLINICAL DATA:  Shortness of Breath EXAM: PORTABLE CHEST 1 VIEW COMPARISON:  July 29, 2017 FINDINGS: There is a persistent right pleural effusion with right base consolidation. There is persistent atelectatic change in the left base. Heart size is within normal limits. The pulmonary vascularity is within normal limits. No adenopathy is appreciable. There is aortic atherosclerosis. Patient is status post coronary artery bypass grafting. No bone lesions evident. IMPRESSION: Persistent right pleural effusion. Consolidation right lower lung zone, slightly increased. Atelectatic change noted left base. Stable cardiac silhouette. There is aortic atherosclerosis. Aortic Atherosclerosis (ICD10-I70.0). Electronically Signed   By: Bretta Bang III M.D.   On: 07/31/2017 08:13         Scheduled Meds: . amiodarone  400 mg Oral Daily  . aspirin EC  81 mg Oral Daily  . atorvastatin  80 mg Oral q1800  . azithromycin  250 mg Oral QHS  . ferrous gluconate  324 mg Oral BID WC  . fluticasone furoate-vilanterol  1 puff Inhalation Daily  . furosemide  40 mg Oral BID  . heparin injection (subcutaneous)  5,000 Units Subcutaneous Q8H  . insulin aspart  0-15 Units Subcutaneous TID WC  . ipratropium-albuterol  3 mL Nebulization TID  . mouth rinse  15 mL Mouth Rinse BID  . methylPREDNISolone (SOLU-MEDROL) injection  60 mg Intravenous Q12H  . metoprolol tartrate  25 mg Oral BID  .  nitroGLYCERIN  0.5 inch Topical Q6H  . potassium chloride  40 mEq Oral BID   Continuous Infusions: . magnesium sulfate 1 - 4 g bolus IVPB 4 g (08/01/17 0817)     LOS: 5 days    Time spent: 40 mins    Ramiro Harvestaniel Shiquan Mathieu, MD [facility] Pager [phone]  If 7PM-7AM, please contact night-coverage [email or web address] Password [email or web address] 08/01/2017, 9:58 AM

## 2017-08-01 NOTE — Plan of Care (Signed)
  Nutrition: Adequate nutrition will be maintained 08/01/2017 0916 - Progressing by William Daltonarpenter, Glendine Swetz L, RN   Pain Managment: General experience of comfort will improve 08/01/2017 0916 - Progressing by William Daltonarpenter, Jermie Hippe L, RN   Skin Integrity: Risk for impaired skin integrity will decrease 08/01/2017 0916 - Progressing by William Daltonarpenter, Xue Low L, RN

## 2017-08-01 NOTE — Progress Notes (Signed)
[facility] Progress Note  Subjective: no new c/o's, up in chair  Vitals:   08/01/17 1200 08/01/17 1220 08/01/17 1400 08/01/17 1600  BP: (!) 180/114 134/71 (!) 153/55   Pulse: 74 71 70   Resp: 16 17 13    Temp: (!) 97.5 F (36.4 C)   97.6 F (36.4 C)  TempSrc: Oral   Oral  SpO2: 97% 96% 96%   Weight:      Height:        Inpatient medications: . amiodarone  400 mg Oral Daily  . aspirin EC  81 mg Oral Daily  . atorvastatin  80 mg Oral q1800  . azithromycin  250 mg Oral QHS  . ferrous gluconate  324 mg Oral BID WC  . fluticasone furoate-vilanterol  1 puff Inhalation Daily  . furosemide  40 mg Oral BID  . heparin injection (subcutaneous)  5,000 Units Subcutaneous Q8H  . insulin aspart  0-15 Units Subcutaneous TID WC  . ipratropium-albuterol  3 mL Nebulization TID  . mouth rinse  15 mL Mouth Rinse BID  . methylPREDNISolone (SOLU-MEDROL) injection  60 mg Intravenous Daily  . metoprolol tartrate  25 mg Oral BID  . nitroGLYCERIN  0.5 inch Topical Q6H  . tamsulosin  0.4 mg Oral Daily    acetaminophen, ipratropium-albuterol, morphine CONCENTRATE  Exam: Gen pleasant WF no distress No jvd or bruits Chest dec'd BS 1/2 up on R, 1/3 on L w/ rales L base  RRR no MRG, sternal scar healing Abd soft ntnd no mass or ascites +bs Ext 1-2+ bilat LE edema Neuro is alert, Ox 3 , nf   UA 06/26/17 > negative UNa 43 on 12/7 UCr 119 on 12/7    Impression: 1. Severe hyponatremia, hypervolemic - diuresing was worsening her electrolytes due to severe concomitant lung disease. No signs of neph syndrome (UA prot neg).  2. Severe COPD with hypercarbic resp failure - pCO2 90's > down to 78 today 3. Metabolic alkalosis - secondary from resp acidosis and primary due to diuresis 4. Chron diast CHF - w/ bilat effusions, some LE edema     Plan - agree with primary MD's plan of resuming home lasix.  Will sign off.    Vinson Moselleob Alacia Rehmann MD [facility] pager  [phone]   08/01/2017, 4:34 PM   Recent Labs  Lab 07/30/17 0503 07/31/17 0246 08/01/17 0304  NA 124* 123* 121*  K 4.4 3.5 4.1  CL 72* <65* <65*  CO2 45* 45* 48*  GLUCOSE 99 88 136*  BUN 7 13 23*  CREATININE 0.47 0.67 0.82  CALCIUM 8.8* 8.1* 8.2*   Recent Labs  Lab 07/27/17 0524  AST 22  ALT 17  ALKPHOS 93  BILITOT 1.1  PROT 5.3*  ALBUMIN 3.3*   Recent Labs  Lab 07/26/17 2051  07/30/17 0804 07/31/17 0246 08/01/17 0304  WBC 6.4   < > 8.1 2.3* 4.5  NEUTROABS 5.7  --   --   --   --   HGB 10.1*   < > 10.9* 9.1* 8.5*  HCT 30.1*   < > 31.8* 26.9* 24.9*  MCV 87.0   < > 86.9 86.5 85.9  PLT 178   < > 158 158 153   < > = values in this interval not displayed.   Iron/TIBC/Ferritin/ %Sat No results found for: IRON, TIBC, FERRITIN, IRONPCTSAT

## 2017-08-01 NOTE — Consult Note (Signed)
Consultation Note Date: 08/01/2017   Patient Name: Samantha Dyer  DOB: [birth year]  MRN: [id number]  Age / Sex: 66 y.o., female  PCP: Samantha Dyer Referring Physician: Eugenie Filler, MD  Reason for Consultation: Establishing goals of care  HPI/Patient Profile: 66 y.o. female  with past medical history of 4V CABG in October 2018, paroxysmal afib, COPD gold stg 4, and hypertriglyceridemia who was admitted on 07/26/2017 with an acute on chronic diastolic heart failure exacerbation as well as hypercapnic respiratory failure and hyponatremia.  It was felt that persistent hyponatremia in the setting of heart failure and hypervolemia is a bad prognostic sign.  Consequently PMT was called to assist in determining goals of care.   Clinical Assessment and Goals of Care:  I have reviewed medical records including EPIC notes, labs and imaging, received report from the care team, assessed the patient and then met at the bedside along with the patient and her husband  to discuss diagnosis prognosis, GOC, EOL wishes, disposition and options.  I introduced Palliative Medicine as specialized medical care for people living with serious illness. It focuses on providing relief from the symptoms and stress of a serious illness. The goal is to improve quality of life for both the patient and the family.  We discussed a brief life review of the patient.  She and her husband have been married 67 years.  They have two sons and multiple grand children.  They have a very close knit family.  She lives in the same home with her husband, a son and a grand son.  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   She is not a particularly religious person.    As far as functional and nutritional status she is eating less.  Her family helps her a great deal at home.  We discussed her current illness and what it  means in the larger context of her on-going co-morbidities.  Natural disease trajectory and expectations at EOL were discussed.  I explained that she has a weak heart and weak lungs, and that she had reached the point that if we tried to fixed one thing we would break something else.  Samantha Dyer said she understood. She has battled severe COPD for a long time.  She tells me she felt fine before her CABG.  The 4 clogged vessels were an incidental finding.  She saw a doctor because she was having indigestion.  She comments that she would have preferred to drop dead from a heart attack rather than to suffer with not being able to breathe and recurrent trips to the hospital.   In several different comments Samantha Dyer made it clear that she would rather for nature to take its course.   We discussed her husband and sons.  They want her to have everything done medically that can be done.  The patient stated "they don't understand what it is like to suffer and not be able to breathe."  She would like to  be allowed to pass away peacefully and in comfort.     We discussed hospice services at home and [facility].  Samantha Dyer agreed to accept Hospice services.    Questions and concerns were addressed.  The family was encouraged to call with questions or concerns.    Primary Decision Maker:  PATIENT    SUMMARY OF RECOMMENDATIONS    Once the patient is medically optimized please discharge her to home with Hospice services.  Code Status/Advance Care Planning:  Changed to DNR   Symptom Management:   Will add SL morphine concentrate for shortness of breath   Psycho-social/Spiritual:   Desire for further Chaplaincy support: no thank you  Prognosis:  Less than 3 months given end stage COPD, advanced heart failure, and severe hyponatremia with deconditioning.    Discharge Planning: Home with Hospice      Primary Diagnoses: Present on Admission: . Acute  diastolic (congestive) heart failure (HCC) . Acute on chronic respiratory failure with hypoxia and hypercapnia (HCC) . CAD (coronary artery disease) . COPD without exacerbation (HCC) . Hyponatremia . Ischemic cardiomyopathy . Pleural effusion   I have reviewed the medical record, interviewed the patient and family, and examined the patient. The following aspects are pertinent.  Past Medical History:  Diagnosis Date  . Allergic rhinitis, cause unspecified   . Anemia, unspecified   . Backache, unspecified   . CAD (coronary artery disease)    a. cath on 06/01/17 showing severe 3-vessel CAD --> s/p CABG on 06/06/2017 with LIMA-LAD, SVG-PDA, and Seq SVG-RI1-RI2.  . COPD (chronic obstructive pulmonary disease) (HCC)   . Esophageal reflux   . Iron deficiency anemia, unspecified    Social History   Socioeconomic History  . Marital status: Married    Spouse name: None  . Number of children: 2  . Years of education: None  . Highest education level: None  Social Needs  . Financial resource strain: None  . Food insecurity - worry: None  . Food insecurity - inability: None  . Transportation needs - medical: None  . Transportation needs - non-medical: None  Occupational History  . Occupation: SECRETARY    Employer: [employer]  Tobacco Use  . Smoking status: Current Every Day Smoker    Packs/day: 1.00    Years: 43.00    Pack years: 43.00    Types: Cigarettes  . Smokeless tobacco: Never Used  Substance and Sexual Activity  . Alcohol use: No  . Drug use: No  . Sexual activity: None  Other Topics Concern  . None  Social History Narrative   ** Merged History Encounter **       Family History  Problem Relation Age of Onset  . Emphysema Mother   . Heart disease Mother   . Breast cancer Mother   . Pancreatic cancer Father    Scheduled Meds: . amiodarone  400 mg Oral Daily  . aspirin EC  81 mg Oral Daily  . atorvastatin  80 mg Oral q1800  . azithromycin  250  mg Oral QHS  . ferrous gluconate  324 mg Oral BID WC  . fluticasone furoate-vilanterol  1 puff Inhalation Daily  . furosemide  40 mg Oral BID  . heparin injection (subcutaneous)  5,000 Units Subcutaneous Q8H  . insulin aspart  0-15 Units Subcutaneous TID WC  . ipratropium-albuterol  3 mL Nebulization TID  . mouth rinse  15 mL Mouth Rinse BID  . methylPREDNISolone (SOLU-MEDROL) injection    60 mg Intravenous Daily  . metoprolol tartrate  25 mg Oral BID  . nitroGLYCERIN  0.5 inch Topical Q6H  . tamsulosin  0.4 mg Oral Daily   Continuous Infusions: PRN Meds:.acetaminophen, ipratropium-albuterol Allergies  Allergen Reactions  . Amoxicillin-Pot Clavulanate Swelling  . Augmentin [Amoxicillin-Pot Clavulanate] Swelling   Review of Systems denies pain and constipation.    Physical Exam  Elderly frail female, A&O coherent, appropriate. Resp on n/c mild increased work of breathing while speaking.  Vital Signs: BP 134/71   Pulse 71   Temp (!) 97.5 F (36.4 C) (Oral)   Resp 17   Ht 5' 4" (1.626 m)   Wt 57.1 kg (125 lb 14.1 oz)   SpO2 96%   BMI 21.61 kg/m  Pain Assessment: No/denies pain   Pain Score: 0-No pain   SpO2: SpO2: 96 % O2 Device:SpO2: 96 % O2 Flow Rate: .O2 Flow Rate (L/min): 2 L/min(uses 2 lpm at home)  IO: Intake/output summary:   Intake/Output Summary (Last 24 hours) at 08/01/2017 1410 Last data filed at 08/01/2017 0700 Gross per 24 hour  Intake -  Output 1275 ml  Net -1275 ml    LBM: Last BM Date: 07/28/17 Baseline Weight: Weight: 60.7 kg (133 lb 12.8 oz) Most recent weight: Weight: 57.1 kg (125 lb 14.1 oz)     Palliative Assessment/Data: 30%     Time In: 2:00 Time Out: 3:20 Time Total: 80 min. Greater than 50%  of this time was spent counseling and coordinating care related to the above assessment and plan.  Signed by: Florentina Jenny, PA-C Palliative Medicine Pager: [phone]  Please contact Palliative Medicine Team phone at [phone] for  questions and concerns.  For individual provider: See Shea Evans

## 2017-08-02 DIAGNOSIS — Z66 Do not resuscitate: Secondary | ICD-10-CM

## 2017-08-02 LAB — BASIC METABOLIC PANEL
BUN: 26 mg/dL — ABNORMAL HIGH (ref 6–20)
CO2: 47 mmol/L — AB (ref 22–32)
CREATININE: 0.75 mg/dL (ref 0.44–1.00)
Calcium: 8.2 mg/dL — ABNORMAL LOW (ref 8.9–10.3)
GFR calc non Af Amer: >60 mL/min (ref >60)
Glucose, Bld: 121 mg/dL — ABNORMAL HIGH (ref 65–99)
POTASSIUM: 3.1 mmol/L — AB (ref 3.5–5.1)
Sodium: 121 mmol/L — ABNORMAL LOW (ref 135–145)

## 2017-08-02 LAB — GLUCOSE, CAPILLARY
GLUCOSE-CAPILLARY: 131 mg/dL — AB (ref 65–99)
GLUCOSE-CAPILLARY: 99 mg/dL (ref 65–99)
GLUCOSE-CAPILLARY: 141 mg/dL — AB (ref 65–99)
Glucose-Capillary: 93 mg/dL (ref 65–99)
Glucose-Capillary: 145 mg/dL — ABNORMAL HIGH (ref 65–99)

## 2017-08-02 LAB — MAGNESIUM: MAGNESIUM: 2.2 mg/dL (ref 1.7–2.4)

## 2017-08-02 MED ORDER — POTASSIUM CHLORIDE CRYS ER 20 MEQ PO TBCR
40.0000 meq | EXTENDED_RELEASE_TABLET | ORAL | Status: AC
  Administered 2017-08-02: 40 meq via ORAL
  Filled 2017-08-02: qty 2

## 2017-08-02 MED ORDER — PREDNISONE 20 MG PO TABS
60.0000 mg | ORAL_TABLET | Freq: Every day | ORAL | Status: DC
  Administered 2017-08-03: 60 mg via ORAL
  Filled 2017-08-02: qty 3

## 2017-08-02 MED ORDER — POTASSIUM CHLORIDE CRYS ER 20 MEQ PO TBCR
40.0000 meq | EXTENDED_RELEASE_TABLET | Freq: Every day | ORAL | Status: DC
  Administered 2017-08-03: 40 meq via ORAL
  Filled 2017-08-02: qty 2

## 2017-08-02 MED ORDER — MORPHINE SULFATE (CONCENTRATE) 10 MG/0.5ML PO SOLN: 2.5000 mg | ORAL | Status: DC | PRN

## 2017-08-02 NOTE — Plan of Care (Signed)
  Progressing Education: Knowledge of General Education information will improve 08/02/2017 1823 - Progressing by Burna SisZavaleta Catalan, Dohn Stclair G, RN Clinical Measurements: Ability to maintain clinical measurements within normal limits will improve 08/02/2017 1823 - Progressing by Burna SisZavaleta Catalan, Hasnain Manheim G, RN Will remain free from infection 08/02/2017 1823 - Progressing by Burna SisZavaleta Catalan, Brittyn Salaz G, RN Diagnostic test results will improve 08/02/2017 1823 - Progressing by Burna SisZavaleta Catalan, Brando Taves G, RN Respiratory complications will improve 08/02/2017 1823 - Progressing by Burna SisZavaleta Catalan, Orpha Dain G, RN Cardiovascular complication will be avoided 08/02/2017 1823 - Progressing by Burna SisZavaleta Catalan, Ivery Nanney G, RN Activity: Risk for activity intolerance will decrease 08/02/2017 1823 - Progressing by Burna SisZavaleta Catalan, Tamani Durney G, RN Nutrition: Adequate nutrition will be maintained 08/02/2017 1823 - Progressing by Burna SisZavaleta Catalan, Jyquan Kenley G, RN Coping: Level of anxiety will decrease 08/02/2017 1823 - Progressing by Burna SisZavaleta Catalan, Christyna Letendre G, RN Elimination: Will not experience complications related to bowel motility 08/02/2017 1823 - Progressing by Burna SisZavaleta Catalan, Jovan Colligan G, RN Will not experience complications related to urinary retention 08/02/2017 1823 - Progressing by Burna SisZavaleta Catalan, Cortne Amara G, RN Pain Managment: General experience of comfort will improve 08/02/2017 1823 - Progressing by Burna SisZavaleta Catalan, Iraida Cragin G, RN Skin Integrity: Risk for impaired skin integrity will decrease 08/02/2017 1823 - Progressing by Burna SisZavaleta Catalan, Keajah Killough G, RN   Adequate for Discharge Health Behavior/Discharge Planning: Ability to manage health-related needs will improve 08/02/2017 1823 - Adequate for Discharge by Burna SisZavaleta Catalan, Garielle Mroz G, RN

## 2017-08-02 NOTE — Progress Notes (Signed)
CRITICAL VALUE ALERT  Critical Value: Chloride less than 65  Date & Time Notied:  12/13 @ 0345  Provider Notified: [facility] @ [phone]  Orders Received/Actions taken: patient's baseline

## 2017-08-02 NOTE — Progress Notes (Signed)
Date: August 02, 2017 Marcelle SmilingRhonda Shatyra Becka, BSN, [place]RN3, [place]CCM [phone] Chart and notes review for patient progress and needs. o2 [place] and bipap Will follow for case management and discharge needs. Next review date: [id number]

## 2017-08-02 NOTE — Progress Notes (Addendum)
PROGRESS NOTE    Samantha DriversKathy D Dyer  ZOX:[id number] DOB: May 02, 1951 DOA: 07/26/2017 PCP: Terri PiedraForcucci, Courtney, PA-C    Brief Narrative:  66 year old female with history of CAD status post CABG in October 2018 dictated by post CABG pleural effusion, status post thoracentesis which showed exudate but procedure was complicated by a small pneumothorax, advanced COPD on home oxygen 2 L, GERD, iron deficiency anemia, chronic diastolic CHF who was admitted to the hospital with severe shortness of breath and hyponatremia.  She was transferred to [facility] care on 07/30/2017, at that time patient was extremely short of breath on BiPAP and in stepdown unit.  Patient placed on IV diuretics as well as COPD management with improvement with her shortness of breath however patient with severe hyponatremia complicated by severe COPD with CO2 retention with chronic secondary compensatory metabolic acidosis.  Nephrology consulted who felt patient was a poor prognosis and recommended palliative care consultation.      Assessment & Plan:   Principal Problem:   Acute diastolic (congestive) heart failure (HCC) Active Problems:   COPD without exacerbation (HCC)   Hyponatremia   Acute on chronic respiratory failure with hypoxia and hypercapnia (HCC)   Ischemic cardiomyopathy   S/P CABG x 4   CAD (coronary artery disease)   Pleural effusion   Pressure injury of skin   Acute urinary retention   Acute on chronic diastolic congestive heart failure (HCC)   Palliative care encounter   DNR (do not resuscitate)   Encounter for hospice care discussion  #1 acute on chronic severe hypoxic and hypercarbic respiratory failure secondary to acute on chronic diastolic CHF EF of 55-60% per recent 2D echo of 06/27/2017.  Patient was placed on a BiPAP on 07/30/2017 given Nitropaste along with IV Lasix and Zaroxolyn and placed on a salt and fluid restriction.  Patient with good diuresis with a urine output of -5.930  L during this hospitalization.  Current weight is 129 pounds from 125 pounds from 133 pounds on admission.  Patient with clinical improvement.  O2 requirements now on 3 L nasal cannula and close to her baseline of 2 L nasal cannula.  Due to patient's persistent hyponatremia diuretics were held until patient was seen in consultation by nephrology.  Will resume Lasix at home regimen of 40 mg p.o. twice daily.  Will discontinue Nitropaste and resume home regimen of Zaroxolyn.   2.  History of recurrent pleural right effusion exudative Initially noted post CABG a few weeks ago.  Reoccurred.  Patient being diuresed.  Long-term management has been deferred to pulmonary.  Patient follows with pulmonary in the outpatient setting.  3.  Advanced COPD Patient on 2 L nasal cannula at home.  See problem #1.  Patient improving clinically.  Patient unlikely having an exacerbation of his COPD.  Continue current recommended broncho-dilations per pulmonary.  HIV Solu-Medrol to oral prednisone taper. Outpatient follow-up with pulmonary.  4.  Severe hyponatremia Patient noted to have orthopnea, shortness of breath in 1-2+ edema on 07/30/2017.  Patient severe hyponatremia felt to be secondary to hypervolemia due to acute CHF exacerbation which is complicated by underlying severe COPD with CO2 retention with chronic secondary compensatory metabolic alkalosis per nephrology.  It was felt given Lasix was likely worsening patient's metabolic alkalosis by causing intravascular volume depletion.  Per pulmonary poor prognosis as patient with severe recurrent hyponatremia in the setting of heart failure is a bad prognostic sign.  Patient also with worsening COPD with increasing PCO2's.  It is felt  per pulmonary this might be a manifestation of end-stage lung disease.  Continue current home regimen of Lasix 40 mg twice daily.  Palliative care has assessed the patient and patient currently now a DNR once patient has been medically  optimized patient will be discharged home with hospice.    5.  Coronary artery disease status post recent CABG Currently chest pain-free.  Continue current home regimen of aspirin, beta-blocker, statin.  Outpatient follow-up.  6.  Hyperlipidemia On statin.  Outpatient follow-up.  7.  Paroxysmal atrial fibrillation CHA2DS2VASC score of at least 3.  Currently in normal sinus rhythm.  Continue beta-blocker, amiodarone and aspirin.  Long-term anticoagulation will be to defer to patient's primary cardiologist in the outpatient setting.  Outpatient follow-up.  8.  Chronic iron deficiency anemia H&H stable.  9.  Acute urinary retention Foley catheter has been placed.  Patient started on Flomax 08/01/2017.  We will discontinue Foley catheter for voiding trial today.  Follow.  10.  Hypokalemia Replete.  Will likely need to start patient on daily potassium supplementation 40 mEq daily starting tomorrow.   DVT prophylaxis: Heparin Code Status: DNR Family Communication: Updated patient.  No family at bedside. Disposition Plan: Home with hospice once medically optimized hopefully in the next 24-48 hours.   Consultants:   Nephrology: Dr.Schertz 07/31/2017  PCCM: Dr. Kendrick FriesMcQuaid obtain 2018  Palliative care : Nelly RoutMarianne Dillinger, PA 08/01/2017  Procedures:   Chest x-ray 07/26/2017, 07/29/2017, 07/31/2017    Antimicrobials:   Azithromycin 07/29/2017>>>>>> 08/03/2017   Subjective: Patient sitting up in chair.  Patient states shortness of breath improving.  Patient denies any chest pain.  Patient tolerating current regular diet.  Patient does not want to be discharged home on a BiPAP nightly.  Objective: Vitals:   08/02/17 0400 08/02/17 0500 08/02/17 0600 08/02/17 0800  BP: (!) 126/46  (!) 144/53   Pulse: 64 65 68   Resp: 14 15 11    Temp:    (!) 97.5 F (36.4 C)  TempSrc:    Axillary  SpO2: 93% 93% 100%   Weight:  58.6 kg (129 lb 3 oz)    Height:        Intake/Output Summary  (Last 24 hours) at 08/02/2017 0913 Last data filed at 08/01/2017 1500 Gross per 24 hour  Intake -  Output 850 ml  Net -850 ml   Filed Weights   07/31/17 0500 08/01/17 0500 08/02/17 0500  Weight: 57.1 kg (125 lb 14.1 oz) 57.1 kg (125 lb 14.1 oz) 58.6 kg (129 lb 3 oz)    Examination:  General exam: Appears calm and comfortable  Respiratory system: Minimal bibasilar crackles otherwise clear.  No wheezing.  No rhonchi.  Decreased breath sounds in the right base.  Respiratory effort normal. Cardiovascular system: S1 & S2 heard, RRR. No JVD, murmurs, rubs, gallops or clicks.  1-2+ bilateral lower extremity edema. Gastrointestinal system: Abdomen is soft, nontender, nondistended, positive bowel sounds.   Central nervous system: Alert and oriented. No focal neurological deficits. Extremities: Symmetric 5 x 5 power. Skin: No rashes, lesions or ulcers Psychiatry: Judgement and insight appear normal. Mood & affect appropriate.     Data Reviewed: I have personally reviewed following labs and imaging studies  CBC: Recent Labs  Lab 07/26/17 2051 07/27/17 0524 07/30/17 0804 07/31/17 0246 08/01/17 0304  WBC 6.4 4.5 8.1 2.3* 4.5  NEUTROABS 5.7  --   --   --   --   HGB 10.1* 9.5* 10.9* 9.1* 8.5*  HCT 30.1* 28.3* 31.8*  26.9* 24.9*  MCV 87.0 86.3 86.9 86.5 85.9  PLT 178 178 158 158 153   Basic Metabolic Panel: Recent Labs  Lab 07/29/17 0410 07/30/17 0503 07/31/17 0246 08/01/17 0304 08/02/17 0243  NA 122* 124* 123* 121* 121*  K 3.6 4.4 3.5 4.1 3.1*  CL 67* 72* <65* <65* <65*  CO2 44* 45* 45* 48* 47*  GLUCOSE 83 99 88 136* 121*  BUN 8 7 13  23* 26*  CREATININE 0.53 0.47 0.67 0.82 0.75  CALCIUM 8.1* 8.8* 8.1* 8.2* 8.2*  MG  --   --  1.3* 1.7 2.2   GFR: Estimated Creatinine Clearance: 60.5 mL/min (by C-G formula based on SCr of 0.75 mg/dL). Liver Function Tests: Recent Labs  Lab 07/27/17 0524  AST 22  ALT 17  ALKPHOS 93  BILITOT 1.1  PROT 5.3*  ALBUMIN 3.3*   No  results for input(s): LIPASE, AMYLASE in the last 168 hours. No results for input(s): AMMONIA in the last 168 hours. Coagulation Profile: No results for input(s): INR, PROTIME in the last 168 hours. Cardiac Enzymes: No results for input(s): CKTOTAL, CKMB, CKMBINDEX, TROPONINI in the last 168 hours. BNP (last 3 results) No results for input(s): PROBNP in the last 8760 hours. HbA1C: No results for input(s): HGBA1C in the last 72 hours. CBG: Recent Labs  Lab 08/01/17 0755 08/01/17 1128 08/01/17 1613 08/01/17 2106 08/02/17 0816  GLUCAP 124* 101* 157* 131* 99   Lipid Profile: No results for input(s): CHOL, HDL, LDLCALC, TRIG, CHOLHDL, LDLDIRECT in the last 72 hours. Thyroid Function Tests: No results for input(s): TSH, T4TOTAL, FREET4, T3FREE, THYROIDAB in the last 72 hours. Anemia Panel: No results for input(s): VITAMINB12, FOLATE, FERRITIN, TIBC, IRON, RETICCTPCT in the last 72 hours. Sepsis Labs: No results for input(s): PROCALCITON, LATICACIDVEN in the last 168 hours.  Recent Results (from the past 240 hour(s))  MRSA PCR Screening     Status: None   Collection Time: 07/30/17  6:35 AM  Result Value Ref Range Status   MRSA by PCR NEGATIVE NEGATIVE Final    Comment:        The GeneXpert MRSA Assay (FDA approved for NASAL specimens only), is one component of a comprehensive MRSA colonization surveillance program. It is not intended to diagnose MRSA infection nor to guide or monitor treatment for MRSA infections.          Radiology Studies: No results found.      Scheduled Meds: . amiodarone  400 mg Oral Daily  . aspirin EC  81 mg Oral Daily  . atorvastatin  80 mg Oral q1800  . azithromycin  250 mg Oral QHS  . ferrous gluconate  324 mg Oral BID WC  . fluticasone furoate-vilanterol  1 puff Inhalation Daily  . furosemide  40 mg Oral BID  . heparin injection (subcutaneous)  5,000 Units Subcutaneous Q8H  . insulin aspart  0-15 Units Subcutaneous TID WC  .  ipratropium-albuterol  3 mL Nebulization TID  . mouth rinse  15 mL Mouth Rinse BID  . methylPREDNISolone (SOLU-MEDROL) injection  60 mg Intravenous Daily  . metoprolol tartrate  25 mg Oral BID  . nitroGLYCERIN  0.5 inch Topical Q6H  . potassium chloride SA  40 mEq Oral Q4H  . [START ON 08/03/2017] potassium chloride SA  40 mEq Oral Daily  . tamsulosin  0.4 mg Oral Daily   Continuous Infusions:    LOS: 6 days    Time spent: 40 mins    Ramiro Harvest, MD [facility] Pager [phone]  If 7PM-7AM, please contact night-coverage [email or web address] Password [facility] 08/02/2017, 9:13 AM

## 2017-08-02 NOTE — Progress Notes (Signed)
Pt refused BiPAP qhs.  Pt states she is not going home with BiPAP qhs and doesn't want to wear tonight.  RT and Pt discussed and Pt agreed to reconsider if she got into distress tonight.

## 2017-08-03 DIAGNOSIS — Z7189 Other specified counseling: Secondary | ICD-10-CM

## 2017-08-03 LAB — GLUCOSE, CAPILLARY
GLUCOSE-CAPILLARY: 88 mg/dL (ref 65–99)
GLUCOSE-CAPILLARY: 159 mg/dL — AB (ref 65–99)

## 2017-08-03 LAB — BASIC METABOLIC PANEL
BUN: 28 mg/dL — ABNORMAL HIGH (ref 6–20)
CALCIUM: 8.3 mg/dL — AB (ref 8.9–10.3)
CO2: >50 mmol/L — ABNORMAL HIGH (ref 22–32)
Chloride: <65 mmol/L — CL (ref 101–111)
Creatinine, Ser: 0.84 mg/dL (ref 0.44–1.00)
GLUCOSE: 115 mg/dL — AB (ref 65–99)
Potassium: 3.4 mmol/L — ABNORMAL LOW (ref 3.5–5.1)
SODIUM: 124 mmol/L — AB (ref 135–145)

## 2017-08-03 LAB — CBC
HCT: 24.2 % — ABNORMAL LOW (ref 36.0–46.0)
HEMOGLOBIN: 8.3 g/dL — AB (ref 12.0–15.0)
MCH: 29.9 pg (ref 26.0–34.0)
MCHC: 34.3 g/dL (ref 30.0–36.0)
MCV: 87.1 fL (ref 78.0–100.0)
Platelets: 115 10*3/uL — ABNORMAL LOW (ref 150–400)
RBC: 2.78 MIL/uL — ABNORMAL LOW (ref 3.87–5.11)
RDW: 13.4 % (ref 11.5–15.5)
WBC: 5.2 10*3/uL (ref 4.0–10.5)

## 2017-08-03 MED ORDER — MORPHINE SULFATE (CONCENTRATE) 10 MG/0.5ML PO SOLN: 2.5000 mg | ORAL | 0 refills | Status: AC | PRN

## 2017-08-03 MED ORDER — POTASSIUM CHLORIDE CRYS ER 20 MEQ PO TBCR: 40.0000 meq | EXTENDED_RELEASE_TABLET | Freq: Every day | ORAL | 0 refills | Status: AC

## 2017-08-03 MED ORDER — TAMSULOSIN HCL 0.4 MG PO CAPS: 0.4000 mg | ORAL_CAPSULE | Freq: Every day | ORAL | 0 refills | Status: DC

## 2017-08-03 MED ORDER — PREDNISONE 20 MG PO TABS: 20.0000 mg | ORAL_TABLET | Freq: Every day | ORAL | 0 refills | Status: AC

## 2017-08-03 NOTE — Progress Notes (Signed)
[facility] Hospital Liaison:  RN visit  Notified by Bjorn Loserhonda, [facility] Surgery CenterCMRN, of patient/family request [facility] after discharge.  Chart and patient information under review [facility].  Hospice eligibility pending at this time.   Writer spoke with patient and spouse at bedside to initiate education related to hospice philosophy, services and team approach to care. Patient/family verbalized understanding of information.  Per discussion, plan is for discharge to home today by private vehicle.   Please send signed and completed DNR form home with patient/family.  Patient will need prescriptions for discharge comfort medications.   DME needs have been discussed, patient currently has the following equipment in the home:  O2 concentrator x 2 and portable O2 tanks; bedside commode, rollator, walker.  Patient/family declines needing any additional equipment at this time.  [facility] equipment manager has been notified of equipment already in the home.  [facility]erral [facility] aware of the above.  Completed discharge summary will need to be [facility] at [phone], when final.  Please notify [facility] when patient is ready to leave unit at discharge.  Call [phone] or [phone] after 5pm.  [facility] information and contact numbers given to husband during the visit.  Above information shared with Bjorn LoserRhonda, [facility] System[facility]RN.  Please call with any hospice related questions.   Thank you for this referral.  Adele BarthelAmy Evans, RN, BSN [facility] Hospital Liaison [phone]  All hospital liaisons are now on AMION.  Please refer to AMION to ensure you are calling Liaison working that day.

## 2017-08-03 NOTE — Progress Notes (Signed)
[id number]/Layne Dilauro,BSN,RN3,CCM:[phone]/tct-Amy Evans [facility] notified of pending dc/ no dme needs per the patient and family, has o2 at home and [place][portable o2 for the transport home via private car.

## 2017-08-03 NOTE — Progress Notes (Signed)
CRITICAL VALUE ALERT  Critical Value:  Cl 65  Date & Time Notied:  08/03/18  0503  Provider Notified: Kirtland BouchardK. Schoor   Orders Received/Actions taken: no new orders at this moment.

## 2017-08-03 NOTE — Discharge Summary (Signed)
Physician Discharge Summary  Samantha Dyer ZOX:[id number] DOB: 12-Oct-1950 DOA: 07/26/2017  PCP: Terri Piedra, PA-C  Admit date: 07/26/2017 Discharge date: 08/03/2017  Time spent: 65 minutes  Recommendations for Outpatient Follow-up:  1. Patient to be discharged home with hospice.   2. Follow-up with Dr. Vassie Loll, pulmonary in 2 weeks to follow-up on COPD. 3. Follow-up with Terri Piedra, PA-C in 2 weeks.  On follow-up patient will need a basic metabolic profile done to follow-up on electrolytes and renal function.  On day of discharge patient's sodium was 124.  Patient was discharged home with hospice. 4. Follow-up with Dr. Allyson Sabal, cardiology in 2-3 weeks for follow-up on patient's CHF.   Discharge Diagnoses:  Principal Problem:   Acute diastolic (congestive) heart failure (HCC) Active Problems:   COPD without exacerbation (HCC)   Hyponatremia   Acute on chronic respiratory failure with hypoxia and hypercapnia (HCC)   Ischemic cardiomyopathy   S/P CABG x 4   CAD (coronary artery disease)   Pleural effusion   Pressure injury of skin   Acute urinary retention   Acute on chronic diastolic congestive heart failure (HCC)   Palliative care encounter   DNR (do not resuscitate)   Encounter for hospice care discussion   Discharge Condition: Stable and improved  Diet recommendation: Regular diet.  Patient insistent on regular diet and not a heart healthy diet.  Filed Weights   08/01/17 0500 08/02/17 0500 08/03/17 0500  Weight: 57.1 kg (125 lb 14.1 oz) 58.6 kg (129 lb 3 oz) 57.7 kg (127 lb 3.3 oz)    History of present illness:  Per Dr. Karmen Bongo Luckow is a 66 y.o. female with medical history significant of coronary artery disease status post CABG in October 2018, advanced COPD on home oxygen, GERD, iron deficiency anemia came to the hospital for evaluation of hyponatremia.  Patient was admitted to the hospital last month for evaluation of weakness and fatigue and was  found to have multiple electrolyte imbalance.  Also had large pleural effusion for which patient underwent thoracentesis and unfortunately subsequently developed pneumothorax for which chest tube was placed.  At first patient was given fluids which improved sodium and then diuresed which further improved sodium.  Eventually she was discharged. After going home patient stated initially she continued to feel well and at her baseline.  She uses 2 L of nasal cannula at rest and two-point 5 at night.  She was seen by her PCP recently who performed routine labs.  Today she was notified that her sodium was low therefore advised to come to the ER. In the ER patient sodium was 120, chloride 44, bicarb 43, BNP of 838.  Chest x-ray showing right-sided pleural effusion slightly increased from previous x-ray at the time of discharge. Her baseline sodium last month was in 120s and was discharged with sodium of 128.  Prior to last month it was in 130s range.  Was determined to admit her to further assess and determine the treatment course for hyponatremia.     Hospital Course:  #1 acute on chronic severe hypoxic and hypercarbic respiratory failure secondary to acute on chronic diastolic CHF Patient admitted with acute on chronic severe hypoxic and hypercarbic respiratory failure felt to be secondary to acute on chronic CHF exacerbation.  Patient was admitted to the stepdown unit.  Patient required BiPAP initially.  Patient subsequently placed on IV diuretics as well as steroid taper and scheduled bronchodilators.  EF of 55-60% per recent 2D echo of 06/27/2017.  Patient was placed on a BiPAP on 07/30/2017 given Nitropaste along with IV Lasix and Zaroxolyn and placed on a salt and fluid restriction.  Patient with good diuresis with a urine output of - 6.545 L during this hospitalization.  Current weight on discharge was 127 pounds from 129 pounds from 125 pounds from 133 pounds on admission.  Patient with clinical  improvement.  O2 requirements now on 2 L nasal cannula which was at baseline by day of discharge. Due to patient's persistent hyponatremia diuretics were held until patient was seen in consultation by nephrology.  Nephrology had recommended mild diuresis patient was placed back on home regimen of Lasix 40 mg twice daily.  Nitropaste was subsequently discontinued.  Patient's Zaroxolyn will be discontinued on discharge.  Outpatient follow-up with pulmonary and PCP and cardiology.  2.  History of recurrent pleural right effusion exudative Initially noted post CABG a few weeks ago.  Reoccurred.  Patient was placed on IV diuretics and diuresed.  Pulmonary was consulted.  Long-term management has been deferred to pulmonary.  Patient follows with pulmonary in the outpatient setting.  3.  Advanced COPD Patient on 2 L nasal cannula at home.  See problem #1.  Patient improved slowly in terms of her shortness of breath during the hospitalization. Patient unlikely having an exacerbation of his COPD.  Patient however was placed on a steroid taper as well as bronchodilation per pulmonary.  IV steroids were subsequently transitioned to oral prednisone and patient was discharged home on a prednisone taper. Outpatient follow-up with pulmonary.  4.  Severe hyponatremia Patient noted to have orthopnea, shortness of breath in 1-2+ edema on 07/30/2017.  Patient with severe hyponatremia felt to be secondary to hypervolemia due to acute CHF exacerbation which is complicated by underlying severe COPD with CO2 retention with chronic secondary compensatory metabolic alkalosis per nephrology.  It was felt given Lasix was likely worsening patient's metabolic alkalosis by causing intravascular volume depletion.  Per nephrology patient with poor prognosis as patient with severe recurrent hyponatremia in the setting of heart failure is a bad prognostic sign.  Patient also with worsening COPD with increasing PCO2's.  It was felt per  pulmonary this might be a manifestation of end-stage lung disease.    Patient was initially diuresed with IV Lasix and subsequently transitioned to home regimen of Lasix 40 mg twice daily per nephrology recommendations.  Palliative care has assessed the patient and patient currently now a DNR once patient has been medically optimized patient will be discharged home with hospice.   Patient currently medically optimized and will be discharged home with hospice.   5.  Coronary artery disease status post recent CABG Patient remained chest pain-free during the hospitalization.    She was maintained on home regimen of aspirin, beta-blocker, statin, amiodarone.  Outpatient follow-up.  6.  Hyperlipidemia On statin.  Outpatient follow-up.  7.  Paroxysmal atrial fibrillation CHA2DS2VASC score of at least 3.  Currently in normal sinus rhythm.  Continued on home regimen of beta-blocker, amiodarone and aspirin.  Long-term anticoagulation will be deferred to patient's primary cardiologist in the outpatient setting.  Outpatient follow-up.  8.  Chronic iron deficiency anemia H&H stable.  9.  Acute urinary retention Patient noted to have acute urinary retention during the hospitalization such that Foley catheter had to be placed.  Patient was also started on Flomax 08/01/2017.  After 4 days voiding trial was attempted with good results.  Patient will be discharged home on Flomax and will need to  follow-up with PCP in the outpatient setting.   10.  Hypokalemia Repleted.  Patient was started on daily potassium supplementation 40 mEq daily.     Procedures:  Chest x-ray 07/26/2017, 07/29/2017, 07/31/2017        Consultations:  Nephrology: Dr.Schertz 07/31/2017  PCCM: Dr. Kendrick FriesMcQuaid obtain 2018  Palliative care : Nelly RoutMarianne Dillinger, PA 08/01/2017      Discharge Exam: Vitals:   08/03/17 1153 08/03/17 1230  BP:  (!) 133/49  Pulse:  65  Resp:  17  Temp: 98.3 F (36.8 C)   SpO2:  91%     General: NAD Cardiovascular: RRR Respiratory: CTAB  Discharge Instructions   Discharge Instructions    Diet general   Complete by:  As directed    Increase activity slowly   Complete by:  As directed      Allergies as of 08/03/2017      Reactions   Amoxicillin-pot Clavulanate Swelling   Augmentin [amoxicillin-pot Clavulanate] Swelling      Medication List    STOP taking these medications   metolazone 5 MG tablet Commonly known as:  ZAROXOLYN   traMADol 50 MG tablet Commonly known as:  ULTRAM     TAKE these medications   acetaminophen 325 MG tablet Commonly known as:  TYLENOL Take 650 mg by mouth every 6 (six) hours as needed for mild pain.   amiodarone 400 MG tablet Commonly known as:  PACERONE Take 1 tablet (400 mg total) daily by mouth.   aspirin 81 MG EC tablet Take 1 tablet (81 mg total) by mouth daily.   atorvastatin 80 MG tablet Commonly known as:  LIPITOR Take 1 tablet (80 mg total) by mouth daily at 6 PM.   COMBIVENT RESPIMAT 20-100 MCG/ACT Aers respimat Generic drug:  Ipratropium-Albuterol Inhale 1 puff 4 (four) times daily as needed into the lungs for wheezing or shortness of breath.   ferrous gluconate 324 MG tablet Commonly known as:  FERGON Take 1 tablet (324 mg total) by mouth 2 (two) times daily with a meal.   fluticasone furoate-vilanterol 200-25 MCG/INH Aepb Commonly known as:  BREO ELLIPTA Inhale 1 puff into the lungs daily.   furosemide 80 MG tablet Commonly known as:  LASIX Take 0.5 tablets (40 mg total) 2 (two) times daily by mouth.   metoprolol tartrate 25 MG tablet Commonly known as:  LOPRESSOR Take 1 tablet (25 mg total) by mouth 2 (two) times daily.   morphine CONCENTRATE 10 MG/0.5ML Soln concentrated solution Place 0.13-0.25 mLs (2.6-5 mg total) under the tongue every hour as needed (shortness of breath unrelieved by nebulizer treatments).   OXYGEN Inhale 2 L into the lungs continuous.   potassium chloride SA 20  MEQ tablet Commonly known as:  K-DUR,KLOR-CON Take 2 tablets (40 mEq total) by mouth daily. What changed:  how much to take   predniSONE 20 MG tablet Commonly known as:  DELTASONE Take 1-2 tablets (20-40 mg total) by mouth daily before breakfast. Take 2 tablets (40mg ) daily x 3 days, then 1 tablet (20mg ) daily x 3 days, then stop. Start taking on:  08/04/2017   SPIRIVA HANDIHALER 18 MCG inhalation capsule Generic drug:  tiotropium inhale contents of one capsule once daily   tamsulosin 0.4 MG Caps capsule Commonly known as:  FLOMAX Take 1 capsule (0.4 mg total) by mouth daily. Start taking on:  08/04/2017      Allergies  Allergen Reactions  . Amoxicillin-Pot Clavulanate Swelling  . Augmentin [Amoxicillin-Pot Clavulanate] Swelling   Follow-up  Information    Forcucci, Providenceourtney, VF CorporationPA-C. Schedule an appointment as soon as possible for a visit in 2 week(s).   Specialty:  Internal Medicine Contact information: [address] [place] [address] [phone]        Oretha MilchAlva, Rakesh V, MD. Schedule an appointment as soon as possible for a visit in 2 week(s).   Specialty:  Pulmonary Disease Why:  f/u in 2-3 weeks. Contact information: [address] [place] [address] [phone]        Runell GessBerry, Jonathan J, MD. Schedule an appointment as soon as possible for a visit in 2 week(s).   Specialties:  Cardiology, Radiology Why:  Follow-up in 2-3 weeks. Contact information: [address] [place] [address] [phone]            The results of significant diagnostics from this hospitalization (including imaging, microbiology, ancillary and laboratory) are listed below for reference.    Significant Diagnostic Studies: Dg Chest 2 View  Result Date: 07/26/2017 CLINICAL DATA:  Shortness of breath for 1 month EXAM: CHEST  2 VIEW COMPARISON:  07/24/2017 FINDINGS: Cardiac shadow is stable. Postoperative changes are again seen. Right-sided pleural effusion is noted  slightly greater than that seen on the prior exam. Mild vascular congestion is noted new from the prior study. No other focal abnormality is seen. IMPRESSION: Slight increase in right-sided pleural effusion. Vascular congestion. Electronically Signed   By: Alcide CleverMark  Lukens M.D.   On: 07/26/2017 21:00   Dg Chest Port 1 View  Result Date: 07/31/2017 CLINICAL DATA:  Shortness of Breath EXAM: PORTABLE CHEST 1 VIEW COMPARISON:  July 29, 2017 FINDINGS: There is a persistent right pleural effusion with right base consolidation. There is persistent atelectatic change in the left base. Heart size is within normal limits. The pulmonary vascularity is within normal limits. No adenopathy is appreciable. There is aortic atherosclerosis. Patient is status post coronary artery bypass grafting. No bone lesions evident. IMPRESSION: Persistent right pleural effusion. Consolidation right lower lung zone, slightly increased. Atelectatic change noted left base. Stable cardiac silhouette. There is aortic atherosclerosis. Aortic Atherosclerosis (ICD10-I70.0). Electronically Signed   By: Bretta BangWilliam  Woodruff III M.D.   On: 07/31/2017 08:13   Dg Chest Port 1 View  Result Date: 07/29/2017 CLINICAL DATA:  Shortness of breath.  History of COPD. EXAM: PORTABLE CHEST 1 VIEW COMPARISON:  Chest radiograph July 26, 2017 FINDINGS: Stable cardiomegaly para calcified aortic knob. Status post median sternotomy for CABG. Similar pulmonary vascular congestion with mild interstitial prominence. Slightly increased moderate RIGHT pleural effusion with juxta peaking. RIGHT lung base consolidation with air bronchograms. Strandy densities LEFT lung base compatible with atelectasis. No pneumothorax. Soft tissue planes and included osseous structures are unchanged. Surgical clips in the included right abdomen compatible with cholecystectomy. Surgical clips in LEFT abdomen. Calcifications in neck are likely vascular. IMPRESSION: Increasing RIGHT pleural  effusion with RIGHT lower lobe consolidation concerning for pneumonia. Stable cardiomegaly, interstitial prominence seen with pulmonary edema or atypical infection. Aortic Atherosclerosis (ICD10-I70.0). Electronically Signed   By: Awilda Metroourtnay  Bloomer M.D.   On: 07/29/2017 20:53    Microbiology: Recent Results (from the past 240 hour(s))  MRSA PCR Screening     Status: None   Collection Time: 07/30/17  6:35 AM  Result Value Ref Range Status   MRSA by PCR NEGATIVE NEGATIVE Final    Comment:        The GeneXpert MRSA Assay (FDA approved for NASAL specimens only), is one component of a comprehensive MRSA colonization  surveillance program. It is not intended to diagnose MRSA infection nor to guide or monitor treatment for MRSA infections.      Labs: Basic Metabolic Panel: Recent Labs  Lab 07/30/17 0503 07/31/17 0246 08/01/17 0304 08/02/17 0243 08/03/17 0256  NA 124* 123* 121* 121* 124*  K 4.4 3.5 4.1 3.1* 3.4*  CL 72* <65* <65* <65* <65*  CO2 45* 45* 48* 47* >50*  GLUCOSE 99 88 136* 121* 115*  BUN 7 13 23* 26* 28*  CREATININE 0.47 0.67 0.82 0.75 0.84  CALCIUM 8.8* 8.1* 8.2* 8.2* 8.3*  MG  --  1.3* 1.7 2.2  --    Liver Function Tests: No results for input(s): AST, ALT, ALKPHOS, BILITOT, PROT, ALBUMIN in the last 168 hours. No results for input(s): LIPASE, AMYLASE in the last 168 hours. No results for input(s): AMMONIA in the last 168 hours. CBC: Recent Labs  Lab 07/30/17 0804 07/31/17 0246 08/01/17 0304 08/03/17 0256  WBC 8.1 2.3* 4.5 5.2  HGB 10.9* 9.1* 8.5* 8.3*  HCT 31.8* 26.9* 24.9* 24.2*  MCV 86.9 86.5 85.9 87.1  PLT 158 158 153 115*   Cardiac Enzymes: No results for input(s): CKTOTAL, CKMB, CKMBINDEX, TROPONINI in the last 168 hours. BNP: BNP (last 3 results) Recent Labs    06/26/17 1427 07/26/17 2051 07/30/17 0804  BNP 484.1* 838.8* 1,269.0*    ProBNP (last 3 results) No results for input(s): PROBNP in the last 8760 hours.  CBG: Recent Labs   Lab 08/02/17 1200 08/02/17 1648 08/02/17 2206 08/03/17 0749 08/03/17 1102  GLUCAP 93 145* 141* 88 159*       Signed:  Ramiro Harvest MD.  [facility] 08/03/2017, 2:41 PM

## 2017-08-07 ENCOUNTER — Ambulatory Visit: Payer: Self-pay | Admitting: Physician Assistant

## 2017-08-16 ENCOUNTER — Telehealth: Payer: Self-pay | Admitting: Cardiovascular Disease

## 2017-08-16 NOTE — Telephone Encounter (Signed)
Spoke with Pam RN with Hospice and provided Dr. Ludwig Clarksrenshaw's recommendations. She is not currently at patient's residence so she requested I call and speak with patient's husband Dorene SorrowJerry or grandson.  Spoke with grandson Gerilyn PilgrimJacob who reports that patient (grandmother) has not been taking lasix for at least 2 weeks. Per Gerilyn PilgrimJacob, his uncle was preparing her pills and she told him to NOT put them in the box. Gerilyn PilgrimJacob states there was confusion over whether or not patient should be taking lasix as her sodium levels were off.   Dyke Maesdvised Jacob to have patient resume lasix 40mg  twice daily as prescribed. Informed him that Hospice nurse will come check lab work either today or tomorrow.   Called Pam RN back and explained situation, she will send staff out to do lab work Designer, jewellery(BMET)

## 2017-08-16 NOTE — Telephone Encounter (Signed)
Pt can increase lasix to 80 mg BID for 3 days and then resume 40 mg BID; check bmet with results to Dr Lavenia AtlasBerry Brian Crenshaw

## 2017-08-16 NOTE — Telephone Encounter (Signed)
Agree with plan Jenica Costilow  

## 2017-08-16 NOTE — Telephone Encounter (Signed)
Spoke with Pam RN with Hospice. Patient admitted to Hospice services at home after hospital discharge on 12/14. She had no edema on initial visit and now has 3+ pitting edema, bilateral LE. Patient has bilateral rales to lower lobes. Patient cannot lie flat. Patient only urinates about 3x/daily and has little UOP per nurse. Patient is on lasix 40mg  BID and potassium 40mEq daily. Per hospital discharge summary, was supposed to have BMET as outpatient but Hospice nurse states this was not done. She cannot go ahead and do the BMET at this time as she does not have the lab supplies but states that another Hospice care team member can go back out to draw labs. Pam RN reports patient is full code, she has MD OV Jan 8  Advised would need to seek recommendation from DOD Dr. Jens Somrenshaw

## 2017-08-25 ENCOUNTER — Other Ambulatory Visit: Payer: Self-pay | Admitting: Physician Assistant

## 2017-08-28 ENCOUNTER — Ambulatory Visit: Payer: Medicare Other | Admitting: Cardiovascular Disease

## 2017-08-30 ENCOUNTER — Other Ambulatory Visit: Payer: Self-pay | Admitting: Physician Assistant

## 2017-08-31 ENCOUNTER — Telehealth: Payer: Self-pay | Admitting: Cardiovascular Disease

## 2017-08-31 MED ORDER — ATORVASTATIN CALCIUM 80 MG PO TABS: 80.0000 mg | ORAL_TABLET | Freq: Every day | ORAL | 3 refills | Status: AC

## 2017-08-31 MED ORDER — FUROSEMIDE 80 MG PO TABS: 40.0000 mg | ORAL_TABLET | Freq: Two times a day (BID) | ORAL | 3 refills | Status: AC

## 2017-08-31 MED ORDER — FERROUS GLUCONATE 324 (38 FE) MG PO TABS: 324.0000 mg | ORAL_TABLET | Freq: Two times a day (BID) | ORAL | 3 refills | Status: AC

## 2017-08-31 NOTE — Telephone Encounter (Signed)
[facility] FillerSam King [facility] calling to speak with a nurse about her oxygen and medication .,

## 2017-08-31 NOTE — Telephone Encounter (Signed)
Returned call to Gay FillerSam King RN with Hospice calling to request medication refills.Also requesting O2 to be increased to 3L/min,Refills sent to pharmacy.Advised call Dr.McQuaid's office for O2 order.Advised patient needs to schedule follow up appointment with Dr.Berry.Stated patient will call back to schedule appointment.

## 2017-09-06 ENCOUNTER — Other Ambulatory Visit: Payer: Self-pay | Admitting: Cardiovascular Disease

## 2017-09-06 ENCOUNTER — Telehealth: Payer: Self-pay | Admitting: Cardiovascular Disease

## 2017-09-06 MED ORDER — TAMSULOSIN HCL 0.4 MG PO CAPS: 0.4000 mg | ORAL_CAPSULE | Freq: Every day | ORAL | 2 refills | Status: DC

## 2017-09-06 MED ORDER — TAMSULOSIN HCL 0.4 MG PO CAPS: 0.4000 mg | ORAL_CAPSULE | Freq: Every day | ORAL | 2 refills | Status: AC

## 2017-09-06 NOTE — Telephone Encounter (Signed)
°*  STAT* If patient is at the pharmacy, call can be transferred to refill team.   1. Which medications need to be refilled? (please list name of each medication and dose if known) Flomax-she needs this today if possible  2. Which pharmacy/location (including street and city if local pharmacy) is medication to be sent to? [facility] [phone]  3. Do they need a 30 day or 90 day supply? 30 and refills*

## 2017-09-06 NOTE — Addendum Note (Signed)
Addended by: Raelyn NumberWILLIAMSON, Jamyah Folk L on: 09/06/2017 02:06 PM   Modules accepted: Orders

## 2017-09-13 ENCOUNTER — Ambulatory Visit: Payer: Medicare Other | Admitting: Pulmonary Disease

## 2017-09-13 ENCOUNTER — Telehealth: Payer: Self-pay | Admitting: Cardiovascular Disease

## 2017-09-13 NOTE — Telephone Encounter (Signed)
New message  Sam is pt Hospice RN verbalized that she is calling for the RN  We have Duoneb treatments and the pt combivent is not a covered medication   Under hospice, pt will hold combivent and the Duoneb is every 4 hours as needed   Pt has been non-compliant with her laxis 40mg   2x day, she has not been taking and she has occasionally has been skipping the evening dose   She will now take 80mg  of laxis every morning  Just so you are aware these orders are from Dr.Feldman,  he is the Hospice Physician

## 2017-09-14 ENCOUNTER — Inpatient Hospital Stay (HOSPITAL_COMMUNITY)
Admission: EM | Admit: 2017-09-14 | Discharge: 2017-09-21 | DRG: 207 | Disposition: E | Payer: Medicare Other | Attending: Pulmonary Disease | Admitting: Pulmonary Disease

## 2017-09-14 ENCOUNTER — Telehealth: Payer: Self-pay | Admitting: Cardiovascular Disease

## 2017-09-14 ENCOUNTER — Emergency Department (HOSPITAL_COMMUNITY): Payer: Medicare Other

## 2017-09-14 DIAGNOSIS — I2510 Atherosclerotic heart disease of native coronary artery without angina pectoris: Secondary | ICD-10-CM | POA: Diagnosis present

## 2017-09-14 DIAGNOSIS — Z7951 Long term (current) use of inhaled steroids: Secondary | ICD-10-CM | POA: Diagnosis not present

## 2017-09-14 DIAGNOSIS — Z7982 Long term (current) use of aspirin: Secondary | ICD-10-CM | POA: Diagnosis not present

## 2017-09-14 DIAGNOSIS — R402312 Coma scale, best motor response, none, at arrival to emergency department: Secondary | ICD-10-CM | POA: Diagnosis not present

## 2017-09-14 DIAGNOSIS — G40501 Epileptic seizures related to external causes, not intractable, with status epilepticus: Secondary | ICD-10-CM | POA: Diagnosis not present

## 2017-09-14 DIAGNOSIS — R509 Fever, unspecified: Secondary | ICD-10-CM | POA: Diagnosis not present

## 2017-09-14 DIAGNOSIS — Z800 Family history of malignant neoplasm of digestive organs: Secondary | ICD-10-CM | POA: Diagnosis not present

## 2017-09-14 DIAGNOSIS — G40A01 Absence epileptic syndrome, not intractable, with status epilepticus: Secondary | ICD-10-CM | POA: Diagnosis not present

## 2017-09-14 DIAGNOSIS — Z951 Presence of aortocoronary bypass graft: Secondary | ICD-10-CM

## 2017-09-14 DIAGNOSIS — D649 Anemia, unspecified: Secondary | ICD-10-CM | POA: Diagnosis present

## 2017-09-14 DIAGNOSIS — K219 Gastro-esophageal reflux disease without esophagitis: Secondary | ICD-10-CM | POA: Diagnosis present

## 2017-09-14 DIAGNOSIS — I469 Cardiac arrest, cause unspecified: Secondary | ICD-10-CM | POA: Diagnosis present

## 2017-09-14 DIAGNOSIS — G931 Anoxic brain damage, not elsewhere classified: Secondary | ICD-10-CM | POA: Diagnosis present

## 2017-09-14 DIAGNOSIS — Z7189 Other specified counseling: Secondary | ICD-10-CM

## 2017-09-14 DIAGNOSIS — I4891 Unspecified atrial fibrillation: Secondary | ICD-10-CM | POA: Diagnosis not present

## 2017-09-14 DIAGNOSIS — R569 Unspecified convulsions: Secondary | ICD-10-CM | POA: Diagnosis not present

## 2017-09-14 DIAGNOSIS — F17210 Nicotine dependence, cigarettes, uncomplicated: Secondary | ICD-10-CM | POA: Diagnosis present

## 2017-09-14 DIAGNOSIS — J189 Pneumonia, unspecified organism: Secondary | ICD-10-CM

## 2017-09-14 DIAGNOSIS — Z9981 Dependence on supplemental oxygen: Secondary | ICD-10-CM | POA: Diagnosis not present

## 2017-09-14 DIAGNOSIS — I468 Cardiac arrest due to other underlying condition: Secondary | ICD-10-CM | POA: Diagnosis not present

## 2017-09-14 DIAGNOSIS — Z4659 Encounter for fitting and adjustment of other gastrointestinal appliance and device: Secondary | ICD-10-CM

## 2017-09-14 DIAGNOSIS — E46 Unspecified protein-calorie malnutrition: Secondary | ICD-10-CM | POA: Diagnosis not present

## 2017-09-14 DIAGNOSIS — E871 Hypo-osmolality and hyponatremia: Secondary | ICD-10-CM | POA: Diagnosis not present

## 2017-09-14 DIAGNOSIS — Z4682 Encounter for fitting and adjustment of non-vascular catheter: Secondary | ICD-10-CM | POA: Diagnosis not present

## 2017-09-14 DIAGNOSIS — E873 Alkalosis: Secondary | ICD-10-CM | POA: Diagnosis present

## 2017-09-14 DIAGNOSIS — R570 Cardiogenic shock: Secondary | ICD-10-CM

## 2017-09-14 DIAGNOSIS — E876 Hypokalemia: Secondary | ICD-10-CM | POA: Diagnosis present

## 2017-09-14 DIAGNOSIS — J9601 Acute respiratory failure with hypoxia: Secondary | ICD-10-CM

## 2017-09-14 DIAGNOSIS — I255 Ischemic cardiomyopathy: Secondary | ICD-10-CM | POA: Diagnosis present

## 2017-09-14 DIAGNOSIS — I509 Heart failure, unspecified: Secondary | ICD-10-CM | POA: Diagnosis present

## 2017-09-14 DIAGNOSIS — D638 Anemia in other chronic diseases classified elsewhere: Secondary | ICD-10-CM | POA: Diagnosis not present

## 2017-09-14 DIAGNOSIS — R402212 Coma scale, best verbal response, none, at arrival to emergency department: Secondary | ICD-10-CM | POA: Diagnosis not present

## 2017-09-14 DIAGNOSIS — E43 Unspecified severe protein-calorie malnutrition: Secondary | ICD-10-CM

## 2017-09-14 DIAGNOSIS — Z8249 Family history of ischemic heart disease and other diseases of the circulatory system: Secondary | ICD-10-CM

## 2017-09-14 DIAGNOSIS — Z803 Family history of malignant neoplasm of breast: Secondary | ICD-10-CM

## 2017-09-14 DIAGNOSIS — J9600 Acute respiratory failure, unspecified whether with hypoxia or hypercapnia: Secondary | ICD-10-CM | POA: Diagnosis not present

## 2017-09-14 DIAGNOSIS — Z825 Family history of asthma and other chronic lower respiratory diseases: Secondary | ICD-10-CM | POA: Diagnosis not present

## 2017-09-14 DIAGNOSIS — Z6822 Body mass index (BMI) 22.0-22.9, adult: Secondary | ICD-10-CM

## 2017-09-14 DIAGNOSIS — J449 Chronic obstructive pulmonary disease, unspecified: Secondary | ICD-10-CM | POA: Diagnosis not present

## 2017-09-14 DIAGNOSIS — I5030 Unspecified diastolic (congestive) heart failure: Secondary | ICD-10-CM | POA: Diagnosis not present

## 2017-09-14 DIAGNOSIS — R402112 Coma scale, eyes open, never, at arrival to emergency department: Secondary | ICD-10-CM | POA: Diagnosis not present

## 2017-09-14 DIAGNOSIS — I252 Old myocardial infarction: Secondary | ICD-10-CM

## 2017-09-14 DIAGNOSIS — R40243 Glasgow coma scale score 3-8, unspecified time: Secondary | ICD-10-CM

## 2017-09-14 DIAGNOSIS — G40901 Epilepsy, unspecified, not intractable, with status epilepticus: Secondary | ICD-10-CM | POA: Diagnosis not present

## 2017-09-14 DIAGNOSIS — Z881 Allergy status to other antibiotic agents status: Secondary | ICD-10-CM

## 2017-09-14 DIAGNOSIS — N179 Acute kidney failure, unspecified: Secondary | ICD-10-CM | POA: Diagnosis not present

## 2017-09-14 DIAGNOSIS — R4020 Unspecified coma: Secondary | ICD-10-CM | POA: Diagnosis not present

## 2017-09-14 DIAGNOSIS — R918 Other nonspecific abnormal finding of lung field: Secondary | ICD-10-CM | POA: Diagnosis not present

## 2017-09-14 DIAGNOSIS — J9620 Acute and chronic respiratory failure, unspecified whether with hypoxia or hypercapnia: Secondary | ICD-10-CM | POA: Diagnosis not present

## 2017-09-14 DIAGNOSIS — Z66 Do not resuscitate: Secondary | ICD-10-CM | POA: Diagnosis present

## 2017-09-14 DIAGNOSIS — I25119 Atherosclerotic heart disease of native coronary artery with unspecified angina pectoris: Secondary | ICD-10-CM | POA: Diagnosis not present

## 2017-09-14 DIAGNOSIS — R402433 Glasgow coma scale score 3-8, at hospital admission: Secondary | ICD-10-CM | POA: Diagnosis not present

## 2017-09-14 DIAGNOSIS — R54 Age-related physical debility: Secondary | ICD-10-CM | POA: Diagnosis present

## 2017-09-14 DIAGNOSIS — Z515 Encounter for palliative care: Secondary | ICD-10-CM | POA: Diagnosis not present

## 2017-09-14 LAB — URINALYSIS, ROUTINE W REFLEX MICROSCOPIC
Bilirubin Urine: NEGATIVE
Glucose, UA: NEGATIVE mg/dL
Hgb urine dipstick: NEGATIVE
Ketones, ur: NEGATIVE mg/dL
NITRITE: NEGATIVE
PROTEIN: 30 mg/dL — AB
Specific Gravity, Urine: 1.012 (ref 1.005–1.030)
pH: 9 — ABNORMAL HIGH (ref 5.0–8.0)

## 2017-09-14 LAB — PROCALCITONIN: Procalcitonin: <0.1 ng/mL

## 2017-09-14 LAB — COMPREHENSIVE METABOLIC PANEL
ALBUMIN: 1.9 g/dL — AB (ref 3.5–5.0)
ALT: 17 U/L (ref 14–54)
AST: 22 U/L (ref 15–41)
Alkaline Phosphatase: 47 U/L (ref 38–126)
Anion gap: 9 (ref 5–15)
BILIRUBIN TOTAL: 0.5 mg/dL (ref 0.3–1.2)
BUN: 8 mg/dL (ref 6–20)
CO2: 30 mmol/L (ref 22–32)
CREATININE: 0.61 mg/dL (ref 0.44–1.00)
Calcium: 5.5 mg/dL — CL (ref 8.9–10.3)
Chloride: 101 mmol/L (ref 101–111)
GFR calc Af Amer: >60 mL/min (ref >60)
GFR calc non Af Amer: >60 mL/min (ref >60)
GLUCOSE: 101 mg/dL — AB (ref 65–99)
POTASSIUM: 3.1 mmol/L — AB (ref 3.5–5.1)
Sodium: 140 mmol/L (ref 135–145)
TOTAL PROTEIN: 3 g/dL — AB (ref 6.5–8.1)

## 2017-09-14 LAB — POCT I-STAT 3, ART BLOOD GAS (G3+)
Acid-Base Excess: 29 mmol/L — ABNORMAL HIGH (ref 0.0–2.0)
Bicarbonate: 56.2 mmol/L — ABNORMAL HIGH (ref 20.0–28.0)
O2 Saturation: 100 %
PH ART: 7.502 — AB (ref 7.350–7.450)
TCO2: >50 mmol/L — ABNORMAL HIGH (ref 22–32)
pCO2 arterial: 71.4 mmHg (ref 32.0–48.0)
pO2, Arterial: 419 mmHg — ABNORMAL HIGH (ref 83.0–108.0)

## 2017-09-14 LAB — CBC WITH DIFFERENTIAL/PLATELET
BASOS ABS: 0 10*3/uL (ref 0.0–0.1)
Basophils Relative: 0 %
EOS PCT: 1 %
Eosinophils Absolute: 0.1 10*3/uL (ref 0.0–0.7)
HEMATOCRIT: 19.3 % — AB (ref 36.0–46.0)
HEMOGLOBIN: 6 g/dL — AB (ref 12.0–15.0)
LYMPHS ABS: 0.6 10*3/uL — AB (ref 0.7–4.0)
LYMPHS PCT: 8 %
MCH: 30 pg (ref 26.0–34.0)
MCHC: 31.1 g/dL (ref 30.0–36.0)
MCV: 96.5 fL (ref 78.0–100.0)
MONOS PCT: 6 %
Monocytes Absolute: 0.5 10*3/uL (ref 0.1–1.0)
NEUTROS PCT: 85 %
Neutro Abs: 6.7 10*3/uL (ref 1.7–7.7)
Platelets: 113 10*3/uL — ABNORMAL LOW (ref 150–400)
RBC: 2 MIL/uL — AB (ref 3.87–5.11)
RDW: 15.6 % — AB (ref 11.5–15.5)
WBC: 7.9 10*3/uL (ref 4.0–10.5)

## 2017-09-14 LAB — STREP PNEUMONIAE URINARY ANTIGEN: Strep Pneumo Urinary Antigen: NEGATIVE

## 2017-09-14 LAB — CREATININE, SERUM
Creatinine, Ser: 0.88 mg/dL (ref 0.44–1.00)
GFR calc Af Amer: >60 mL/min (ref >60)

## 2017-09-14 LAB — I-STAT CHEM 8, ED
BUN: 6 mg/dL (ref 6–20)
CREATININE: 0.6 mg/dL (ref 0.44–1.00)
Calcium, Ion: 0.78 mmol/L — CL (ref 1.15–1.40)
Chloride: 93 mmol/L — ABNORMAL LOW (ref 101–111)
Glucose, Bld: 99 mg/dL (ref 65–99)
HCT: 16 % — ABNORMAL LOW (ref 36.0–46.0)
HEMOGLOBIN: 5.4 g/dL — AB (ref 12.0–15.0)
POTASSIUM: 3.1 mmol/L — AB (ref 3.5–5.1)
SODIUM: 140 mmol/L (ref 135–145)
TCO2: 31 mmol/L (ref 22–32)

## 2017-09-14 LAB — CBC
HEMATOCRIT: 27.8 % — AB (ref 36.0–46.0)
Hemoglobin: 8.5 g/dL — ABNORMAL LOW (ref 12.0–15.0)
MCH: 29.2 pg (ref 26.0–34.0)
MCHC: 30.6 g/dL (ref 30.0–36.0)
MCV: 95.5 fL (ref 78.0–100.0)
Platelets: 163 10*3/uL (ref 150–400)
RBC: 2.91 MIL/uL — AB (ref 3.87–5.11)
RDW: 15.7 % — ABNORMAL HIGH (ref 11.5–15.5)
WBC: 11.8 10*3/uL — AB (ref 4.0–10.5)

## 2017-09-14 LAB — CORTISOL: Cortisol, Plasma: 59.4 ug/dL

## 2017-09-14 LAB — I-STAT TROPONIN, ED: Troponin i, poc: 0.02 ng/mL (ref 0.00–0.08)

## 2017-09-14 LAB — PROTIME-INR
INR: 1.49
Prothrombin Time: 17.9 seconds — ABNORMAL HIGH (ref 11.4–15.2)

## 2017-09-14 LAB — I-STAT CG4 LACTIC ACID, ED
LACTIC ACID, VENOUS: 2.38 mmol/L — AB (ref 0.5–1.9)
Lactic Acid, Venous: 2.14 mmol/L (ref 0.5–1.9)

## 2017-09-14 LAB — LACTIC ACID, PLASMA
Lactic Acid, Venous: 3.7 mmol/L (ref 0.5–1.9)
Lactic Acid, Venous: 1.3 mmol/L (ref 0.5–1.9)

## 2017-09-14 LAB — ABO/RH: ABO/RH(D): O NEG

## 2017-09-14 LAB — PREPARE RBC (CROSSMATCH)

## 2017-09-14 LAB — MRSA PCR SCREENING: MRSA BY PCR: NEGATIVE

## 2017-09-14 MED ORDER — POTASSIUM CHLORIDE 10 MEQ/100ML IV SOLN
10.0000 meq | INTRAVENOUS | Status: AC
  Administered 2017-09-14: 10 meq via INTRAVENOUS
  Filled 2017-09-14 (×2): qty 100

## 2017-09-14 MED ORDER — ALBUTEROL SULFATE (2.5 MG/3ML) 0.083% IN NEBU
5.0000 mg | INHALATION_SOLUTION | Freq: Once | RESPIRATORY_TRACT | Status: AC
  Administered 2017-09-14: 5 mg via RESPIRATORY_TRACT
  Filled 2017-09-14: qty 6

## 2017-09-14 MED ORDER — MIDAZOLAM HCL 2 MG/2ML IJ SOLN: 1.0000 mg | INTRAMUSCULAR | Status: DC | PRN

## 2017-09-14 MED ORDER — FENTANYL CITRATE (PF) 100 MCG/2ML IJ SOLN
INTRAMUSCULAR | Status: AC
  Filled 2017-09-14: qty 2

## 2017-09-14 MED ORDER — VANCOMYCIN HCL 500 MG IV SOLR
500.0000 mg | Freq: Two times a day (BID) | INTRAVENOUS | Status: DC
  Administered 2017-09-14 – 2017-09-15 (×3): 500 mg via INTRAVENOUS
  Filled 2017-09-14 (×4): qty 500

## 2017-09-14 MED ORDER — HEPARIN SODIUM (PORCINE) 5000 UNIT/ML IJ SOLN: 5000.0000 [IU] | Freq: Three times a day (TID) | INTRAMUSCULAR | Status: DC

## 2017-09-14 MED ORDER — SODIUM CHLORIDE 0.9 % IV SOLN: 250.0000 mL | INTRAVENOUS | Status: DC | PRN

## 2017-09-14 MED ORDER — IPRATROPIUM-ALBUTEROL 0.5-2.5 (3) MG/3ML IN SOLN
3.0000 mL | Freq: Four times a day (QID) | RESPIRATORY_TRACT | Status: DC
  Administered 2017-09-14 – 2017-09-17 (×13): 3 mL via RESPIRATORY_TRACT
  Filled 2017-09-14 (×14): qty 3

## 2017-09-14 MED ORDER — SODIUM CHLORIDE 0.9 % IV BOLUS (SEPSIS): 500.0000 mL | Freq: Once | INTRAVENOUS | Status: DC

## 2017-09-14 MED ORDER — ORAL CARE MOUTH RINSE
15.0000 mL | Freq: Four times a day (QID) | OROMUCOSAL | Status: DC
  Administered 2017-09-14 – 2017-09-15 (×5): 15 mL via OROMUCOSAL

## 2017-09-14 MED ORDER — SODIUM CHLORIDE 0.9% FLUSH
10.0000 mL | Freq: Two times a day (BID) | INTRAVENOUS | Status: DC
  Administered 2017-09-14 – 2017-09-15 (×3): 10 mL

## 2017-09-14 MED ORDER — MIDAZOLAM HCL 2 MG/2ML IJ SOLN
INTRAMUSCULAR | Status: AC
  Filled 2017-09-14: qty 2

## 2017-09-14 MED ORDER — NOREPINEPHRINE BITARTRATE 1 MG/ML IV SOLN
5.0000 ug/min | INTRAVENOUS | Status: DC
  Filled 2017-09-14: qty 4

## 2017-09-14 MED ORDER — DEXTROSE 5 % IV SOLN
1.0000 g | Freq: Three times a day (TID) | INTRAVENOUS | Status: DC
  Administered 2017-09-14 – 2017-09-15 (×3): 1 g via INTRAVENOUS
  Filled 2017-09-14 (×4): qty 1

## 2017-09-14 MED ORDER — SODIUM CHLORIDE 0.9% FLUSH: 10.0000 mL | INTRAVENOUS | Status: DC | PRN

## 2017-09-14 MED ORDER — MIDAZOLAM HCL 2 MG/2ML IJ SOLN
1.0000 mg | INTRAMUSCULAR | Status: DC | PRN
  Administered 2017-09-14: 1 mg via INTRAVENOUS

## 2017-09-14 MED ORDER — FENTANYL CITRATE (PF) 100 MCG/2ML IJ SOLN
50.0000 ug | INTRAMUSCULAR | Status: DC | PRN
  Administered 2017-09-17: 50 ug via INTRAVENOUS
  Filled 2017-09-14: qty 2

## 2017-09-14 MED ORDER — CHLORHEXIDINE GLUCONATE 0.12% ORAL RINSE (MEDLINE KIT)
15.0000 mL | Freq: Two times a day (BID) | OROMUCOSAL | Status: DC
  Administered 2017-09-14 – 2017-09-15 (×2): 15 mL via OROMUCOSAL

## 2017-09-14 MED ORDER — SODIUM CHLORIDE 0.9 % IV SOLN: 10.0000 mL/h | Freq: Once | INTRAVENOUS | Status: DC

## 2017-09-14 MED ORDER — SODIUM CHLORIDE 0.9 % IV SOLN
1.0000 g | Freq: Once | INTRAVENOUS | Status: AC
  Administered 2017-09-14: 1 g via INTRAVENOUS
  Filled 2017-09-14 (×2): qty 10

## 2017-09-14 MED ORDER — VANCOMYCIN HCL IN DEXTROSE 1-5 GM/200ML-% IV SOLN
1000.0000 mg | Freq: Once | INTRAVENOUS | Status: AC
  Administered 2017-09-14: 1000 mg via INTRAVENOUS
  Filled 2017-09-14: qty 200

## 2017-09-14 MED ORDER — PANTOPRAZOLE SODIUM 40 MG IV SOLR
40.0000 mg | Freq: Every day | INTRAVENOUS | Status: DC
  Administered 2017-09-14 – 2017-09-18 (×5): 40 mg via INTRAVENOUS
  Filled 2017-09-14 (×5): qty 40

## 2017-09-14 MED ORDER — POTASSIUM CHLORIDE 10 MEQ/50ML IV SOLN
INTRAVENOUS | Status: AC
  Administered 2017-09-14: 10 meq via INTRAVENOUS
  Filled 2017-09-14: qty 50

## 2017-09-14 MED ORDER — ASPIRIN 300 MG RE SUPP: 300.0000 mg | RECTAL | Status: AC

## 2017-09-14 MED ORDER — SODIUM CHLORIDE 0.9 % IV SOLN
INTRAVENOUS | Status: DC
  Administered 2017-09-14 – 2017-09-19 (×6): via INTRAVENOUS

## 2017-09-14 MED ORDER — FENTANYL CITRATE (PF) 100 MCG/2ML IJ SOLN
50.0000 ug | INTRAMUSCULAR | Status: DC | PRN
  Administered 2017-09-14: 50 ug via INTRAVENOUS

## 2017-09-14 MED ORDER — ACETAMINOPHEN 160 MG/5ML PO SOLN
650.0000 mg | Freq: Four times a day (QID) | ORAL | Status: DC | PRN
  Administered 2017-09-14 – 2017-09-17 (×2): 650 mg
  Filled 2017-09-14 (×2): qty 20.3

## 2017-09-14 MED ORDER — CHLORHEXIDINE GLUCONATE CLOTH 2 % EX PADS
6.0000 | MEDICATED_PAD | Freq: Every day | CUTANEOUS | Status: DC
  Administered 2017-09-14: 6 via TOPICAL

## 2017-09-14 MED ORDER — POTASSIUM CHLORIDE 10 MEQ/50ML IV SOLN
10.0000 meq | INTRAVENOUS | Status: AC
  Administered 2017-09-14 (×3): 10 meq via INTRAVENOUS
  Filled 2017-09-14 (×2): qty 50

## 2017-09-14 MED ORDER — MAGNESIUM SULFATE 2 GM/50ML IV SOLN
2.0000 g | Freq: Once | INTRAVENOUS | Status: AC
  Administered 2017-09-14: 2 g via INTRAVENOUS
  Filled 2017-09-14: qty 50

## 2017-09-14 NOTE — Procedures (Signed)
Arterial Catheter Insertion Procedure Note Samantha DriversKathy Dyer [facility] Samantha Dyer [id number] [birth year]  Procedure: Insertion of Arterial Catheter  Indications: Blood pressure monitoring and Frequent blood sampling  Procedure Details Consent: Unable to obtain consent because of pt intubated. Time Out: Verified patient identification, verified procedure, site/side was marked, verified correct patient position, special equipment/implants available, medications/allergies/relevent history reviewed, required imaging and test results available.  Performed  Maximum sterile technique was used including antiseptics, cap, gloves, gown, hand hygiene, mask and sheet. Skin prep: Chlorhexidine; local anesthetic administered 20 gauge catheter was inserted into right radial artery using the Seldinger technique.  Evaluation Blood flow good; BP tracing good. Complications: No apparent complications.   Samantha PrimasSharp, Samantha Dyer 08/07/18

## 2017-09-14 NOTE — Progress Notes (Signed)
[facility] [name] note: [name] made joint visit with Chaplain -Hillary. Pt non-responsive and on a ventilator. Pt appeared comfortable. Discussed case with granddaughter-Haley. No other family present. Educated on end of life provided. Granddaughter reported that she feels pt would not want not want to be like this on vent. Granddaughter shared spouse, sons and grandson having difficulty time coping. Consulted with Tanya,staff RN who reported that pt is partial code with no CPR. Discussed case with Kristine GarbeWendy Slingerland, RN Case manager who has discussed with Patric DykesJohn Feldmann, MD that since there are no plans at this time to discontinue aggressive care with aggressive and life prolonging medications the family may chose to revoke hpcg services. [name] discussed this with Spouse and son-Daniel by phone. Son- Reuel BoomDaniel requested call from Toniann FailWendy, Consulting civil engineerN manager to further discuss medical questions. Please call with concerns.   Elijio Milesebbie Garner, [name] [phone]

## 2017-09-14 NOTE — H&P (Signed)
PULMONARY / CRITICAL CARE MEDICINE   Name: Samantha Dyer MRN: [id number] DOB: 01/06/1951    ADMISSION DATE:  09/18/2017   REFERRING MD: Emergency department physician  CHIEF COMPLAINT: PEA arrest  HISTORY OF PRESENT ILLNESS:   67 year old female with significant pulmonary history of Gold stage IV COPD with a history of pneumothorax following thoracentesis.  She is O2 dependent and was a DNR prior to this admission.  While at home her grandson noted that she was confused and removed her oxygen.  Subsequently she went into a cardiac arrest he performed CPR EMS arrived he was given 1 dose of epi with return of spontaneous circulation.  She was intubated and transported to [facility].  Her DNR was reversed.  She is noted to have a hemoglobin of 6.  She is currently hemodynamically stable not on pressors.  Pulmonary critical care called to admit she will not be a hypothermia candidate. Note receiving MSO4 for palliation. Still smokes.  PAST MEDICAL HISTORY :  She  has a past medical history of Allergic rhinitis, cause unspecified, Anemia, unspecified, Backache, unspecified, CAD (coronary artery disease), COPD (chronic obstructive pulmonary disease) (HCC), Esophageal reflux, and Iron deficiency anemia, unspecified.  PAST SURGICAL HISTORY: She  has a past surgical history that includes RIGHT/LEFT HEART CATH AND CORONARY ANGIOGRAPHY (N/A, 06/01/2017); Coronary artery bypass graft (N/A, 06/06/2017); and TEE without cardioversion (N/A, 06/06/2017).  Allergies  Allergen Reactions  . Amoxicillin-Pot Clavulanate Swelling  . Augmentin [Amoxicillin-Pot Clavulanate] Swelling    No current facility-administered medications on file prior to encounter.    Current Outpatient Medications on File Prior to Encounter  Medication Sig  . acetaminophen (TYLENOL) 325 MG tablet Take 650 mg by mouth every 6 (six) hours as needed for mild pain.  Marland Kitchen amiodarone (PACERONE) 400 MG tablet Take 1 tablet (400 mg  total) daily by mouth.  Marland Kitchen aspirin EC 81 MG EC tablet Take 1 tablet (81 mg total) by mouth daily.  Marland Kitchen atorvastatin (LIPITOR) 80 MG tablet Take 1 tablet (80 mg total) by mouth daily at 6 PM.  . COMBIVENT RESPIMAT 20-100 MCG/ACT AERS respimat Inhale 1 puff 4 (four) times daily as needed into the lungs for wheezing or shortness of breath.  . ferrous gluconate (FERGON) 324 MG tablet Take 1 tablet (324 mg total) by mouth 2 (two) times daily with a meal.  . fluticasone furoate-vilanterol (BREO ELLIPTA) 200-25 MCG/INH AEPB Inhale 1 puff into the lungs daily.  . furosemide (LASIX) 80 MG tablet Take 0.5 tablets (40 mg total) by mouth 2 (two) times daily.  . metoprolol tartrate (LOPRESSOR) 25 MG tablet Take 1 tablet (25 mg total) by mouth 2 (two) times daily.  . Morphine Sulfate (MORPHINE CONCENTRATE) 10 MG/0.5ML SOLN concentrated solution Place 0.13-0.25 mLs (2.6-5 mg total) under the tongue every hour as needed (shortness of breath unrelieved by nebulizer treatments).  . OXYGEN Inhale 2 L into the lungs continuous.  . potassium chloride SA (K-DUR,KLOR-CON) 20 MEQ tablet Take 2 tablets (40 mEq total) by mouth daily.  . predniSONE (DELTASONE) 20 MG tablet Take 1-2 tablets (20-40 mg total) by mouth daily before breakfast. Take 2 tablets (40mg ) daily x 3 days, then 1 tablet (20mg ) daily x 3 days, then stop.  . tamsulosin (FLOMAX) 0.4 MG CAPS capsule Take 1 capsule (0.4 mg total) by mouth daily.  Marland Kitchen tiotropium (SPIRIVA HANDIHALER) 18 MCG inhalation capsule inhale contents of one capsule once daily    FAMILY HISTORY:  Her indicated that the status of her  mother is unknown. She indicated that the status of her father is unknown.   SOCIAL HISTORY: She  reports that she has been smoking cigarettes.  She has a 43.00 pack-year smoking history. she has never used smokeless tobacco. She reports that she does not drink alcohol or use drugs.  REVIEW OF SYSTEMS:   Not available  SUBJECTIVE:  Frail female intubated  and on ventilator  VITAL SIGNS: BP (!) 134/54   Pulse 63   Temp (!) 93.4 F (34.1 C) (Bladder)   Resp 18   Ht 5\' 4"  (1.626 m)   Wt 61.4 kg (135 lb 4.8 oz)   SpO2 100%   BMI 23.22 kg/m   HEMODYNAMICS:    VENTILATOR SETTINGS: Vent Mode: PRVC FiO2 (%):  [100 %] 100 % Set Rate:  [16 bmp-18 bmp] 18 bmp Vt Set:  [440 mL-470 mL] 440 mL PEEP:  [5 cmH20] 5 cmH20 Plateau Pressure:  [16 cmH20] 16 cmH20  INTAKE / OUTPUT: No intake/output data recorded.  PHYSICAL EXAMINATION: General: Frail elderly female sedated on vent Neuro: Withdraws to pain HEENT: Pupils equal reactive at 4 mm JVD 3+ Cardiovascular: Heart sounds are regular Lungs: Decreased breath sounds in the bases Abdomen: Soft nontender multiple areas petechiae noted Musculoskeletal: Grossly intact Skin: Multiple areas of petechiae throughout the body  LABS:  BMET Recent Labs  Lab 03-10-18 0723 03-10-18 0726  NA 140 140  K 3.1* 3.1*  CL 101 93*  CO2 30  --   BUN 8 6  CREATININE 0.61 0.60  GLUCOSE 101* 99    Electrolytes Recent Labs  Lab 03-10-18 0723  CALCIUM 5.5*    CBC Recent Labs  Lab 03-10-18 0723 03-10-18 0726  WBC 7.9  --   HGB 6.0* 5.4*  HCT 19.3* 16.0*  PLT PENDING  --     Coag's Recent Labs  Lab 03-10-18 0723  INR 1.49    Sepsis Markers Recent Labs  Lab 03-10-18 0727  LATICACIDVEN 2.38*    ABG No results for input(s): PHART, PCO2ART, PO2ART in the last 168 hours.  Liver Enzymes Recent Labs  Lab 03-10-18 0723  AST 22  ALT 17  ALKPHOS 47  BILITOT 0.5  ALBUMIN 1.9*    Cardiac Enzymes No results for input(s): TROPONINI, PROBNP in the last 168 hours.  Glucose No results for input(s): GLUCAP in the last 168 hours.  Imaging Dg Chest Port 1 View  Result Date: 2018-02-10 CLINICAL DATA:  67 year old female presenting from home via EMS. Intubated. EXAM: PORTABLE CHEST 1 VIEW COMPARISON:  07/31/2017 and earlier. FINDINGS: Portable AP supine view at 0711 hrs.  Endotracheal tube tip is at the level the clavicles. Enteric tube courses to the abdomen, tip not included. Larger lung volumes. Regressed right pleural effusion since December. Prior CABG. Mediastinal contours are within normal limits. Multifocal bilateral Patchy and confluent pulmonary opacity including in the left upper lobe and right lower lobe. No pneumothorax. No pulmonary edema. No acute osseous abnormality identified. IMPRESSION: 1. Endotracheal tube in good position. Enteric tube courses to the abdomen, tip not included. 2. Large lung volumes with multifocal bilateral pulmonary opacity suspicious for bilateral pneumonia. Consider aspiration. 3. No pleural effusion is evident. Electronically Signed   By: Odessa FlemingH  Hall M.D.   On: 02019-06-23 07:58     STUDIES:    CULTURES: None  ANTIBIOTICS: None  SIGNIFICANT EVENTS: 2018-02-10 PEA arrest  LINES/TUBES: 2018-02-10 endotracheal tube>> 2018-02-10 left internal jugular CVL>> 2018-02-10 right radial arterial line pending>>  DISCUSSION:  67 year old  female with significant pulmonary history of Gold stage IV COPD with a history of pneumothorax following thoracentesis.  She is O2 dependent and was a DNR prior to this admission.  While at home her grandson noted that she was confused and removed her oxygen.  Subsequently she went into a cardiac arrest he performed CPR EMS arrived he was given 1 dose of epi with return of spontaneous circulation.  She was intubated and transported to [facility].  Her DNR was reversed.  She is noted to have a hemoglobin of 6.  She is currently hemodynamically stable not on pressors.  Pulmonary critical care called to admit she will not be a hypothermia candidate. ASSESSMENT / PLAN:  PULMONARY A: Vent dependent respiratory failure most likely pulmonary origin leading to a cardiac arrest with PEA History of end-stage chronic obstructive pulmonary disease.  COPD Gold stage IV History pneumothorax prior from  thoracentesis P:   Vent bundle Bronchodilators as needed No need for steroids  CARDIOVASCULAR A:  PEA arrest 09-30-2017 most likely from pulmonary calls Rhythm reestablished with 1 dose of epinephrine and CPR We will not need hypothermia due to history of end-stage chronic obstructive pulmonary disease most likely pulmonary because of arrest Coronary artery disease post open heart surgery.  TEE on 09/06/2016 shows LV function is low normal EF 50% Congestive heart failure P:  Replete electrolytes  Pressors as needed Gentle hydration  RENAL Lab Results  Component Value Date   CREATININE 0.60 09/30/2017   CREATININE 0.61 09/30/17   CREATININE 0.84 08/03/2017    Recent Labs  Lab 09/30/2017 0723 30-Sep-2017 0726  NA 140 140    Recent Labs  Lab Sep 30, 2017 0723 2017/09/30 0726  K 3.1* 3.1*    A:   Hypokalemia Hypocalcemia P:   Replete electrolytes as needed  GASTROINTESTINAL A:   History of gastroesophageal reflux disease P:   Orogastric tube PPI  HEMATOLOGIC Recent Labs    2017/09/30 0723 09/30/17 0726  HGB 6.0* 5.4*    A:   Anemia P:  Type and crossmatch and transfuse for hemoglobin less than 7.  INFECTIOUS A:   No overt infections P:   Monitor white count temperature  ENDOCRINE A:   No acute issues P:   Monitor glucose  NEUROLOGIC A:   Witnessed arrest presumed pulmonary leading to cardiac.  CPR started by grandson at scene.  This appears more pulmonary than cardiac therefore will not cool. P:   RASS goal: .  -1 Sedation for tube tolerance If she does not wakeup will need a CT of the head.  FAMILY  - Updates:   - Inter-disciplinary family meet or Palliative Care meeting due by:  day 7  App cct 60 min Brett Canales Minor ACNP Adolph Pollack PCCM Pager [phone] till 1 pm If no answer page [phone] 09-30-17, 8:51 AM  Attending Note:  67 year old female with COPD history who continues to smoke and was on hospice care who presents after a  cardiac arrest.  ROSC after 25 minutes.  Respiratory code.  Patient remains completely unresponsive and was PEA arrest on exam.  Will not proceed with cardiac arrest.  I reviewed CXR myself, ETT is in good position, no PTX.  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  Will continue support for now.  Will order a head CT and EEG in AM.  Will need neuro status to declare itself prior to decision on long  term plan.  The patient is critically ill with multiple organ systems failure and requires high complexity decision making for assessment and support, frequent evaluation and titration of therapies, application of advanced monitoring technologies and extensive interpretation of multiple databases.   Critical Care Time devoted to patient care services described in this note is  45  Minutes. This time reflects time of care of this signee Dr Koren Bound. This critical care time does not reflect procedure time, or teaching time or supervisory time of PA/NP/Med student/Med Resident etc but could involve care discussion time.  Alyson Reedy, M.D. [facility] Pulmonary/Critical Care Medicine. Pager: [phone]. After hours pager: [phone].

## 2017-09-14 NOTE — Progress Notes (Signed)
[name] and [name] made joint visit. Met with patient's granddaughter, Samantha Dyer, at bedside. Patient is un-responsive and on a ventilator. She provided update of event that led to hospitalization and family discussions with medical team since admission. Patient was found at home unresponsive. Family began CPR and called 911. She expressed feelings of grief, but also repeated that "this is not what she (patient) would want." Granddaughter stated that she has had several conversations with patient in the past about her feelings and wishes. She feels patient has no fears about end-of-life. She discussed how spouse, sons, and grandson are having a difficult time understanding patient's status and making decisions. Much end-of-life education with emotional and spiritual support provided to the granddaughter. Chaplain and [name] consulted with RN, Samantha Dyer, who reported that patient is now a partial code. [name] discussed case with Samantha Dyer, Nurse Manager, who discussed with Samantha Loveless, MD. With no plans at this current time to discontinue aggressive care and life-prolonging medications, family may choose to revoke [facility] services. [name] spoke with patient's spouse, Samantha Dyer, and son, Samantha Dyer, by phone to discuss goals of care. Son - Samantha Dyer - requested to a call from Samantha Dyer to further discuss medical questions and goals of care.  Please call [facility] with concerns/questions.  [place], [place] [phone]

## 2017-09-14 NOTE — Telephone Encounter (Signed)
Message routed to Dr. Allyson SabalBerry for review.

## 2017-09-14 NOTE — ED Notes (Signed)
To ED via [facility] from home, with EMS

## 2017-09-14 NOTE — ED Notes (Signed)
Pt having slight movements with arms and toes,

## 2017-09-14 NOTE — Progress Notes (Signed)
   09/05/2017 0900  Clinical Encounter Type  Visited With Patient;Family;Health care provider  Visit Type ED  Referral From Nurse  Consult/Referral To Chaplain  Spiritual Encounters  Spiritual Needs Emotional  Stress Factors  Family Stress Factors Major life changes;Health changes   Responded to a page to support the family of the patient.  Sat with the granddaughter until the rest of the family arrived (grandson, son and husband)  Let the NP know and we met with the family.  Family was wresting with what has happened and decisions.  Family will continue to need support.  Santiago Glad (the patient's nurse) was going to get them to come back and visit the patient before she is admitted.  Will follow up with the family.   Chaplain Katherene Ponto

## 2017-09-14 NOTE — ED Triage Notes (Addendum)
To ed via [facility] from  Home -- pt's grandson heard pt yelling, pt had taken her O2 off at some time during the night -- pt became pulseless and apneic per grandson-- CPR was started -- when EMS arrived pt was in PEA-- received EPI x1 -- ROSC returned, pt received Solumedrol 125mg  IV, albuterol and atrovent-- EMS intubated in the field. On arrival to the ED pt was intubated, pulses present, ice bags placed on pt for potential code cool.

## 2017-09-14 NOTE — ED Notes (Signed)
Report given to 2H -  

## 2017-09-14 NOTE — Progress Notes (Signed)
Pt transported from ED to ICU and vent.  Pt tolerated well.  RT will continue to monitor.

## 2017-09-14 NOTE — Progress Notes (Signed)
[facility] Hospital Liaison:  RN   Spoke with Clydie BraunKaren, bedside RN in ED.  She advised that the patient was brought in s/p CPR by EMS.  Patient did attain ROSC.  Patient currently intubated and will be transferred to 2H06 for admit.  I will follow up when patient is admitted.  Many family members in the room.   Per Clydie BraunKaren, GOC need to be readressed with patient's family.  She advised that husband, granddaughter and grandson were very receptive, but that the sons were less so.    I have talked with Debbie, [facility] SW, who will be coming to speak with family also upon admit.   Thank you.  Adele BarthelAmy Evans, RN, BSN [facility] Hospital Liaison [phone]  All hospital liaisons are now on AMION.

## 2017-09-14 NOTE — Progress Notes (Signed)
[name] note: Team discussion with today with spouse, son-Daniel and granddaughter-Haley. [facility] has chosen to [facility] effective today as they desire to continue aggressive care and life prolonging medications. Revocation and [facility] Discharge Summary Form signed by granddaughter as approved by spouse since he is not present at hospital.   Elijio Milesebbie Garner, [name]

## 2017-09-14 NOTE — Progress Notes (Signed)
[place] [facility] [place] - GIP RN visit  This is a related and covered GIP admission of 08/29/2017 with [facility] diagnosis of diastolic heart failure, per Dr. Karie Georges.  Patient is FULL CODE upon admission.  Family activated EMS after cardiac arrest.  Patient admitted to hospital s/p Cardiac Arrest.    Per ED note, "Patient grandson had heard patient yelling, patient had taken off her O2 at some point during the night.  Patient became pulseless and apneic, CPT started in the home by family.  When EMS arrived, patient was in PEA, received EPI x 1 with return of ROSC.  Patient received Solumedrol 125 mg IV, albuterol and atrovent.  EMS intubated in the field.  EMS placed bags on patient for potential code cool prior to arrival in ED."  Met with family at patient bedside.  Lavella Lemons, bedside RN, wanted to get family okay to speak with hospice services.  Family advised okay.  I spoke with husband, Sonia Side, who advised that they had changed patient to full code approximately a month ago, but are now wanting to change to DNR status.  He states, "she wanted to be full code until she got worse, she's worse now".  [facility] SW, Jackelyn Poling, will be visiting with family also and going over [place].  Lavella Lemons, bedside RN, was made aware that the family wants to speak with MD to clarify their wishes for patient.   Patient was unresponsive, intubated and on full ventilator support when I visited.  Patient very pale in appearance.  Per bedside RN, temp low at 95.7 (last measurement) and they had bair hugger placed on patient for warmth.  CVC Triple Lumen placed in Emergency department.  Patient also with 2 PIVs:  20 L wrist and 18 L AC.    Patient receiving:  Ipratropium - albuterol (DUONEB) 0.5 - 2.5 (3) MG/3ML nebulizer solution 3 mL, Dose 3 mL, Q6H via nebulization; pantoprazole (PROTONIX) injection 40 mg, Dose 40 mg, QD at bedtime via IV.  Continuous medications:   ceFEPIme (MAXIPIME) 1 g in dextrose 5% 61m  IVPB, Dose 1 g, Q8H via IV; potassium chloride 10 mEq in 100 mL IVPB, Dose 10 mE1, Q1hr x 4 via IV given at 0928.  PRN medications:  fentaNYL (SUBLIMAZE) injection 50 mcg, Dose 50 mcg Q14 min PRN via IV; midazolam (VERSED) injection 1 mg, Dose 1 mg, Q15 min PRN via IV.    Advised Dr. JCherie Ouchand Dr. JLorie Phenixof admission status.  Transfer summary and medication list to be placed on shadow chart.   We will continue to monitor while patient is hospitalized.  Please call with any hospice related questions or concerns.   Thank you.  AEdyth Gunnels RN, BSN [facility] Day[facility]iaison [phone] All hospital liaisons are now on [place]

## 2017-09-14 NOTE — Progress Notes (Signed)
Pharmacy Antibiotic Note  Samantha Dyer is a 67 y.o. female admitted on 09/11/2017 with pneumonia.  Pharmacy has been consulted for vancomycin and cefepime dosing.  Allergy listed to Augmentin as "swelling" but per discussion with EDP okay to challenge with cefepime.  Plan: Vancomycin load 1 gram then 500mg  every 12 hours.  Goal trough 15-20 mcg/mL.  Cefepime 1 gram every 8 hours.    Monitor clinical progression, renal function, and trough as needed.  Height: 5\' 4"  (162.6 cm) Weight: 135 lb 4.8 oz (61.4 kg) IBW/kg (Calculated) : 54.7  Temp (24hrs), Avg:93.4 F (34.1 C), Min:93.4 F (34.1 C), Max:93.4 F (34.1 C)  Recent Labs  Lab 08/30/2017 0723 09/18/2017 0726 09/03/2017 0727  WBC 7.9  --   --   CREATININE 0.61 0.60  --   LATICACIDVEN  --   --  2.38*    Estimated Creatinine Clearance: 59.7 mL/min (by C-G formula based on SCr of 0.6 mg/dL).    Allergies  Allergen Reactions  . Amoxicillin-Pot Clavulanate Swelling  . Augmentin [Amoxicillin-Pot Clavulanate] Swelling    Antimicrobials this admission: 1/25 vancomycin >>  1/25 cefepime >>   Dose adjustments this admission: N/A  Microbiology results: 1/25 BCx: x 2 pending 1/25 UCx: pending  Thank you for allowing pharmacy to be a part of this patient's care.  Harlow AsaAmber C Yopp, PharmD 09/13/2017 9:19 AM

## 2017-09-14 NOTE — Telephone Encounter (Signed)
Amy [facility] is calling because [facility] [id number] after Cardiac Arrest . Thanks

## 2017-09-14 NOTE — Procedures (Signed)
Central Venous Catheter Insertion Procedure Note Lum KeasKathy D Bekele [id number] [birth year]  Procedure: Insertion of Central Venous Catheter Indications: Assessment of intravascular volume, Drug and/or fluid administration and Frequent blood sampling  Procedure Details Consent: Unable to obtain consent because of emergent medical necessity. Time Out: Verified patient identification, verified procedure, site/side was marked, verified correct patient position, special equipment/implants available, medications/allergies/relevent history reviewed, required imaging and test results available.  Performed  Maximum sterile technique was used including antiseptics, cap, gloves, gown, hand hygiene, mask and sheet. Skin prep: Chlorhexidine; local anesthetic administered A antimicrobial bonded/coated triple lumen catheter was placed in the left internal jugular vein using the Seldinger technique.  Evaluation Blood flow good Complications: No apparent complications Patient did tolerate procedure well. Chest X-ray ordered to verify placement.  CXR: pending.  U/S used in placement  YACOUB,WESAM December 05, 2017, 8:32 AM

## 2017-09-14 NOTE — ED Provider Notes (Signed)
[facility] EMERGENCY DEPARTMENT Provider Note   CSN: [id number] Arrival date & time: 09/11/2017  0708     History   Chief Complaint Chief Complaint  Patient presents with  . post cpr  . code cool    HPI Samantha Dyer is a 67 y.o. female history of CAD status post CABG, COPD on oxygen at baseline, hyponatremia, on hospice here presenting with cardiac arrest, intubation.  Patient apparently woke up this morning and son witnessed that she became altered suddenly and stopped breathing.  He called EMS and CPR was started right away.  She was in PEA initially and patient was given 1 round of epi and was intubated by EMS and brought to the ED.  Patient was started on epi drip by EMS as well.  Patient apparently was admitted to the hospital about a month ago and was diagnosed with hyponatremia, CHF and COPD exacerbation and was put on hospice and was a DNR apparently.   The history is provided by the EMS personnel and a relative. The history is limited by the condition of the patient.   Level V caveat- intubated   Past Medical History:  Diagnosis Date  . Allergic rhinitis, cause unspecified   . Anemia, unspecified   . Backache, unspecified   . CAD (coronary artery disease)    a. cath on 06/01/17 showing severe 3-vessel CAD --> s/p CABG on 06/06/2017 with LIMA-LAD, SVG-PDA, and Seq SVG-RI1-RI2.  Marland Kitchen COPD (chronic obstructive pulmonary disease) (HCC)   . Esophageal reflux   . Iron deficiency anemia, unspecified     Patient Active Problem List   Diagnosis Date Noted  . Cardiac arrest (HCC) 08/28/2017  . Acute urinary retention 08/01/2017  . Acute on chronic diastolic congestive heart failure (HCC)   . Palliative care encounter   . DNR (do not resuscitate)   . Encounter for hospice care discussion   . Pressure injury of skin 07/30/2017  . Pleural effusion 07/26/2017  . Pneumothorax   . CAD (coronary artery disease) 06/25/2017  . S/P CABG x 4 06/06/2017  .  Ischemic cardiomyopathy   . Hypokalemia   . Pure hyperglyceridemia   . Non-ST elevation (NSTEMI) myocardial infarction (HCC)   . Systolic congestive heart failure (HCC)   . Acute on chronic respiratory failure with hypoxia and hypercapnia (HCC) 05/29/2017  . COPD exacerbation (HCC)   . Acute respiratory failure with hypoxia and hypercapnia (HCC)   . Acute diastolic (congestive) heart failure (HCC)   . Hyponatremia   . Acute on chronic respiratory failure (HCC) 09/12/2016  . Acute respiratory failure with hypoxemia (HCC) 07/10/2016  . Hypomagnesemia 07/08/2016  . Hyponatremia 07/07/2016  . Acute respiratory failure with hypoxia (HCC) 07/07/2016  . COPD (chronic obstructive pulmonary disease) (HCC) 07/07/2016  . COPD exacerbation (HCC) 08/28/2015  . TOBACCO ABUSE 07/26/2010  . Chronic respiratory failure (HCC) 07/26/2010  . UNSPECIFIED IRON DEFICIENCY ANEMIA 07/25/2010  . UNSPECIFIED ANEMIA 07/25/2010  . ALLERGIC RHINITIS CAUSE UNSPECIFIED 07/25/2010  . COPD without exacerbation (HCC) 07/25/2010  . Esophageal reflux 07/25/2010  . UNSPECIFIED BACKACHE 07/25/2010    Past Surgical History:  Procedure Laterality Date  . CORONARY ARTERY BYPASS GRAFT N/A 06/06/2017   Procedure: CORONARY ARTERY BYPASS GRAFTING x 4 USING LEFT INTERNAL MAMMARY ARTERY AND RIGHT GREATER SAPHENOUS VEIN HARVESTED ENDOSCOPICALLY -LIMA to LAD -SVG to PDA -SEQ SVG to RAMUS 1 and RAMUS 2;  Surgeon: Loreli Slot, MD;  Location: [facility] OR;  Service: Open Heart Surgery;  Laterality:  N/A;  . RIGHT/LEFT HEART CATH AND CORONARY ANGIOGRAPHY N/A 06/01/2017   Procedure: RIGHT/LEFT HEART CATH AND CORONARY ANGIOGRAPHY;  Surgeon: Corky Crafts, MD;  Location: [facility] INVASIVE CV LAB;  Service: Cardiovascular;  Laterality: N/A;  . TEE WITHOUT CARDIOVERSION N/A 06/06/2017   Procedure: TRANSESOPHAGEAL ECHOCARDIOGRAM (TEE);  Surgeon: Loreli Slot, MD;  Location: [facility] OR;  Service: Open Heart Surgery;  Laterality: N/A;      OB History    No data available       Home Medications    Prior to Admission medications   Medication Sig Start Date End Date Taking? Authorizing Provider  acetaminophen (TYLENOL) 325 MG tablet Take 650 mg by mouth every 6 (six) hours as needed for mild pain.    [provider]  amiodarone (PACERONE) 400 MG tablet Take 1 tablet (400 mg total) daily by mouth. 06/25/17   Strader, Lennart Pall, PA-C  aspirin EC 81 MG EC tablet Take 1 tablet (81 mg total) by mouth daily. 06/14/17   Barrett, Erin R, PA-C  atorvastatin (LIPITOR) 80 MG tablet Take 1 tablet (80 mg total) by mouth daily at 6 PM. 08/31/17   Runell Gess, MD  COMBIVENT RESPIMAT 20-100 MCG/ACT AERS respimat Inhale 1 puff 4 (four) times daily as needed into the lungs for wheezing or shortness of breath. 07/05/17   Lonia Blood, MD  ferrous gluconate (FERGON) 324 MG tablet Take 1 tablet (324 mg total) by mouth 2 (two) times daily with a meal. 08/31/17   Runell Gess, MD  fluticasone furoate-vilanterol (BREO ELLIPTA) 200-25 MCG/INH AEPB Inhale 1 puff into the lungs daily. 09/21/16   Parrett, Virgel Bouquet, NP  furosemide (LASIX) 80 MG tablet Take 0.5 tablets (40 mg total) by mouth 2 (two) times daily. 08/31/17   Runell Gess, MD  metoprolol tartrate (LOPRESSOR) 25 MG tablet Take 1 tablet (25 mg total) by mouth 2 (two) times daily. 06/14/17   Barrett, Erin R, PA-C  Morphine Sulfate (MORPHINE CONCENTRATE) 10 MG/0.5ML SOLN concentrated solution Place 0.13-0.25 mLs (2.6-5 mg total) under the tongue every hour as needed (shortness of breath unrelieved by nebulizer treatments). 08/03/17   Rodolph Bong, MD  OXYGEN Inhale 2 L into the lungs continuous.    [provider]  potassium chloride SA (K-DUR,KLOR-CON) 20 MEQ tablet Take 2 tablets (40 mEq total) by mouth daily. 08/03/17   Rodolph Bong, MD  predniSONE (DELTASONE) 20 MG tablet Take 1-2 tablets (20-40 mg total) by mouth daily before breakfast. Take 2  tablets (40mg ) daily x 3 days, then 1 tablet (20mg ) daily x 3 days, then stop. 08/04/17   Rodolph Bong, MD  tamsulosin (FLOMAX) 0.4 MG CAPS capsule Take 1 capsule (0.4 mg total) by mouth daily. 09/06/17   Iran Ouch, Lennart Pall, PA-C  tiotropium (SPIRIVA HANDIHALER) 18 MCG inhalation capsule inhale contents of one capsule once daily 03/15/16   [provider]    Family History Family History  Problem Relation Age of Onset  . Emphysema Mother   . Heart disease Mother   . Breast cancer Mother   . Pancreatic cancer Father     Social History Social History   Tobacco Use  . Smoking status: Current Every Day Smoker    Packs/day: 1.00    Years: 43.00    Pack years: 43.00    Types: Cigarettes  . Smokeless tobacco: Never Used  Substance Use Topics  . Alcohol use: No  . Drug use: No  Allergies   Amoxicillin-pot clavulanate and Augmentin [amoxicillin-pot clavulanate]   Review of Systems Review of Systems  Unable to perform ROS: Mental status change  All other systems reviewed and are negative.    Physical Exam Updated Vital Signs BP (!) 134/54   Pulse 63   Temp (!) 93.4 F (34.1 C) (Bladder)   Resp 18   Ht 5\' 4"  (1.626 m)   Wt 61.4 kg (135 lb 4.8 oz)   SpO2 100%   BMI 23.22 kg/m   Physical Exam  Constitutional:  Intubated   HENT:  Head: Normocephalic.  Intubated   Eyes: EOM are normal.  Neck: Normal range of motion.  Cardiovascular: Normal rate.  Faint pulses   Pulmonary/Chest: Effort normal.  Bilateral breath sounds   Abdominal: Soft.  Musculoskeletal: Normal range of motion.  Neurological:  Intubated   Skin: Skin is warm.  Psychiatric:  Unable   Nursing note and vitals reviewed.    ED Treatments / Results  Labs (all labs ordered are listed, but only abnormal results are displayed) Labs Reviewed  CBC WITH DIFFERENTIAL/PLATELET - Abnormal; Notable for the following components:      Result Value   RBC 2.00 (*)    Hemoglobin 6.0 (*)     HCT 19.3 (*)    RDW 15.6 (*)    Platelets 113 (*)    Lymphs Abs 0.6 (*)    All other components within normal limits  COMPREHENSIVE METABOLIC PANEL - Abnormal; Notable for the following components:   Potassium 3.1 (*)    Glucose, Bld 101 (*)    Calcium 5.5 (*)    Total Protein 3.0 (*)    Albumin 1.9 (*)    All other components within normal limits  PROTIME-INR - Abnormal; Notable for the following components:   Prothrombin Time 17.9 (*)    All other components within normal limits  I-STAT CHEM 8, ED - Abnormal; Notable for the following components:   Potassium 3.1 (*)    Chloride 93 (*)    Calcium, Ion 0.78 (*)    Hemoglobin 5.4 (*)    HCT 16.0 (*)    All other components within normal limits  I-STAT CG4 LACTIC ACID, ED - Abnormal; Notable for the following components:   Lactic Acid, Venous 2.38 (*)    All other components within normal limits  CULTURE, BLOOD (ROUTINE X 2)  CULTURE, BLOOD (ROUTINE X 2)  URINE CULTURE  CULTURE, BLOOD (ROUTINE X 2)  CULTURE, BLOOD (ROUTINE X 2)  URINE CULTURE  URINALYSIS, ROUTINE W REFLEX MICROSCOPIC  BLOOD GAS, ARTERIAL  PROCALCITONIN  CORTISOL  CBC  PROTIME-INR  APTT  STREP PNEUMONIAE URINARY ANTIGEN  BLOOD GAS, ARTERIAL  CBC  CREATININE, SERUM  LACTIC ACID, PLASMA  LACTIC ACID, PLASMA  I-STAT TROPONIN, ED  CBG MONITORING, ED  I-STAT CG4 LACTIC ACID, ED  TYPE AND SCREEN  PREPARE RBC (CROSSMATCH)  ABO/RH    EKG  EKG Interpretation  Date/Time:  Friday September 14 2017 07:12:31 EST Ventricular Rate:  78 PR Interval:    QRS Duration: 117 QT Interval:  412 QTC Calculation: 470 R Axis:   76 Text Interpretation:  Sinus rhythm Nonspecific intraventricular conduction delay Abnrm T, consider ischemia, anterolateral lds Baseline wander in lead(s) V1 V5 TWI new since previous Confirmed by Richardean CanalYao, Donn Wilmot H [phone] on 09/16/2017 7:20:08 AM       Radiology Dg Chest Port 1 View  Result Date: 08/31/2017 CLINICAL DATA:  67 year old  female presenting from home via EMS.  Intubated. EXAM: PORTABLE CHEST 1 VIEW COMPARISON:  07/31/2017 and earlier. FINDINGS: Portable AP supine view at 0711 hrs. Endotracheal tube tip is at the level the clavicles. Enteric tube courses to the abdomen, tip not included. Larger lung volumes. Regressed right pleural effusion since December. Prior CABG. Mediastinal contours are within normal limits. Multifocal bilateral Patchy and confluent pulmonary opacity including in the left upper lobe and right lower lobe. No pneumothorax. No pulmonary edema. No acute osseous abnormality identified. IMPRESSION: 1. Endotracheal tube in good position. Enteric tube courses to the abdomen, tip not included. 2. Large lung volumes with multifocal bilateral pulmonary opacity suspicious for bilateral pneumonia. Consider aspiration. 3. No pleural effusion is evident. Electronically Signed   By: Odessa Fleming M.D.   On: 08/30/2017 07:58    Procedures Procedures (including critical care time)  CRITICAL CARE Performed by: Richardean Canal   Total critical care time: 30 minutes  Critical care time was exclusive of separately billable procedures and treating other patients.  Critical care was necessary to treat or prevent imminent or life-threatening deterioration.  Critical care was time spent personally by me on the following activities: development of treatment plan with patient and/or surrogate as well as nursing, discussions with consultants, evaluation of patient's response to treatment, examination of patient, obtaining history from patient or surrogate, ordering and performing treatments and interventions, ordering and review of laboratory studies, ordering and review of radiographic studies, pulse oximetry and re-evaluation of patient's condition.   Medications Ordered in ED Medications  aspirin suppository 300 mg (not administered)  norepinephrine (LEVOPHED) 4 mg in dextrose 5 % 250 mL (0.016 mg/mL) infusion (not  administered)  fentaNYL (SUBLIMAZE) injection 50 mcg (50 mcg Intravenous Given 08/21/2017 0752)  fentaNYL (SUBLIMAZE) injection 50 mcg (not administered)  midazolam (VERSED) injection 1 mg (1 mg Intravenous Given 08/24/2017 0758)  midazolam (VERSED) injection 1 mg (not administered)  0.9 %  sodium chloride infusion (not administered)  fentaNYL (SUBLIMAZE) 100 MCG/2ML injection (not administered)  midazolam (VERSED) 2 MG/2ML injection (not administered)  magnesium sulfate IVPB 2 g 50 mL (not administered)  potassium chloride 10 mEq in 100 mL IVPB (not administered)  pantoprazole (PROTONIX) injection 40 mg (not administered)  0.9 %  sodium chloride infusion (not administered)  heparin injection 5,000 Units (not administered)  0.9 %  sodium chloride infusion (not administered)  ipratropium-albuterol (DUONEB) 0.5-2.5 (3) MG/3ML nebulizer solution 3 mL (not administered)  albuterol (PROVENTIL) (2.5 MG/3ML) 0.083% nebulizer solution 5 mg (5 mg Nebulization Given 09/13/2017 0806)     Initial Impression / Assessment and Plan / ED Course  I have reviewed the triage vital signs and the nursing notes.  Pertinent labs & imaging results that were available during my care of the patient were reviewed by me and considered in my medical decision making (see chart for details).     Samantha Dyer is a 67 y.o. female here with cardiac arrest. Witnessed arrest by son at home, was PEA and given 1 round of epi and regained pulse. She was admitted a month ago and should be in hospice and was DNR. However, that was not communicated with EMS so she was intubated in the field. Will talk to son when he arrives. Will start cold saline and ice pack to groin and consult critical care.   7:42 AM I called grandson who was with her this morning. He performed CPR on her. He noticed that she took off her oxygen and then held her breath and became blue  and loss pulses. He started CPR right away and EMS noted PEA and gave her 1  round of epi. Total down time around 15 minutes. I asked about hospice care. Apparently hospice came to see her and she rescinded the DNR and is now full code. I notified critical care and talked to Dr. Molli Knock.   9:13 AM Critical care at bedside. Hg 5.5. Ordered 3 U PRBC. CXR showed possible pneumonia. Central line and A line placed by critical care. Cold cool initiated. Critical care to admit.   Final Clinical Impressions(s) / ED Diagnoses   Final diagnoses:  Cardiac arrest [facility])    ED Discharge Orders    None       Charlynne Pander, MD 10/12/17 [phone]

## 2017-09-15 ENCOUNTER — Inpatient Hospital Stay (HOSPITAL_COMMUNITY): Payer: Medicare Other

## 2017-09-15 DIAGNOSIS — G931 Anoxic brain damage, not elsewhere classified: Secondary | ICD-10-CM

## 2017-09-15 DIAGNOSIS — J9600 Acute respiratory failure, unspecified whether with hypoxia or hypercapnia: Principal | ICD-10-CM

## 2017-09-15 DIAGNOSIS — I469 Cardiac arrest, cause unspecified: Secondary | ICD-10-CM

## 2017-09-15 LAB — BLOOD CULTURE ID PANEL (REFLEXED)
ACINETOBACTER BAUMANNII: NOT DETECTED
CANDIDA ALBICANS: NOT DETECTED
CANDIDA GLABRATA: NOT DETECTED
CANDIDA TROPICALIS: NOT DETECTED
Candida krusei: NOT DETECTED
Candida parapsilosis: NOT DETECTED
ENTEROBACTER CLOACAE COMPLEX: NOT DETECTED
ENTEROBACTERIACEAE SPECIES: NOT DETECTED
ENTEROCOCCUS SPECIES: NOT DETECTED
Escherichia coli: NOT DETECTED
Haemophilus influenzae: NOT DETECTED
KLEBSIELLA PNEUMONIAE: NOT DETECTED
Klebsiella oxytoca: NOT DETECTED
LISTERIA MONOCYTOGENES: NOT DETECTED
Neisseria meningitidis: NOT DETECTED
PSEUDOMONAS AERUGINOSA: NOT DETECTED
Proteus species: NOT DETECTED
STREPTOCOCCUS PNEUMONIAE: NOT DETECTED
STREPTOCOCCUS PYOGENES: NOT DETECTED
Serratia marcescens: NOT DETECTED
Staphylococcus aureus (BCID): NOT DETECTED
Staphylococcus species: NOT DETECTED
Streptococcus agalactiae: NOT DETECTED
Streptococcus species: NOT DETECTED

## 2017-09-15 LAB — BASIC METABOLIC PANEL
Anion gap: 13 (ref 5–15)
BUN: 19 mg/dL (ref 6–20)
CHLORIDE: 79 mmol/L — AB (ref 101–111)
CO2: 39 mmol/L — AB (ref 22–32)
Calcium: 8.5 mg/dL — ABNORMAL LOW (ref 8.9–10.3)
Creatinine, Ser: 1.15 mg/dL — ABNORMAL HIGH (ref 0.44–1.00)
GFR calc non Af Amer: 48 mL/min — ABNORMAL LOW (ref >60)
GFR, EST AFRICAN AMERICAN: 56 mL/min — AB (ref >60)
Glucose, Bld: 115 mg/dL — ABNORMAL HIGH (ref 65–99)
POTASSIUM: 4.4 mmol/L (ref 3.5–5.1)
Sodium: 131 mmol/L — ABNORMAL LOW (ref 135–145)

## 2017-09-15 LAB — BLOOD GAS, ARTERIAL
Acid-Base Excess: 23.6 mmol/L — ABNORMAL HIGH (ref 0.0–2.0)
BICARBONATE: 48.8 mmol/L — AB (ref 20.0–28.0)
FIO2: 40
LHR: 18 {breaths}/min
O2 SAT: 95.5 %
PATIENT TEMPERATURE: 98.6
PEEP/CPAP: 5 cmH2O
PH ART: 7.56 — AB (ref 7.350–7.450)
PO2 ART: 70.9 mmHg — AB (ref 83.0–108.0)
VT: 400 mL
pCO2 arterial: 54.5 mmHg — ABNORMAL HIGH (ref 32.0–48.0)

## 2017-09-15 LAB — CBC
HEMATOCRIT: 24.7 % — AB (ref 36.0–46.0)
Hemoglobin: 7.8 g/dL — ABNORMAL LOW (ref 12.0–15.0)
MCH: 28.7 pg (ref 26.0–34.0)
MCHC: 31.6 g/dL (ref 30.0–36.0)
MCV: 90.8 fL (ref 78.0–100.0)
Platelets: 114 10*3/uL — ABNORMAL LOW (ref 150–400)
RBC: 2.72 MIL/uL — AB (ref 3.87–5.11)
RDW: 15.5 % (ref 11.5–15.5)
WBC: 11.4 10*3/uL — ABNORMAL HIGH (ref 4.0–10.5)

## 2017-09-15 MED ORDER — ORAL CARE MOUTH RINSE
15.0000 mL | OROMUCOSAL | Status: DC
  Administered 2017-09-15 – 2017-09-19 (×41): 15 mL via OROMUCOSAL

## 2017-09-15 MED ORDER — CHLORHEXIDINE GLUCONATE 0.12% ORAL RINSE (MEDLINE KIT)
15.0000 mL | Freq: Two times a day (BID) | OROMUCOSAL | Status: DC
  Administered 2017-09-15 – 2017-09-19 (×9): 15 mL via OROMUCOSAL

## 2017-09-15 MED ORDER — DEXTROSE 5 % IV SOLN
2.0000 g | INTRAVENOUS | Status: DC
  Administered 2017-09-16 – 2017-09-18 (×3): 2 g via INTRAVENOUS
  Filled 2017-09-15 (×3): qty 2

## 2017-09-15 MED ORDER — CHLORHEXIDINE GLUCONATE CLOTH 2 % EX PADS
6.0000 | MEDICATED_PAD | Freq: Every day | CUTANEOUS | Status: DC
  Administered 2017-09-15: 6 via TOPICAL

## 2017-09-15 NOTE — Progress Notes (Signed)
PULMONARY / CRITICAL CARE MEDICINE   Name: Samantha Dyer MRN: [id number] DOB: 05-17-1951    ADMISSION DATE:  09/12/2017   REFERRING MD: Emergency department physician  CHIEF COMPLAINT: PEA arrest  HISTORY OF PRESENT ILLNESS:   67 year old female with significant pulmonary history of Gold stage IV COPD with CABG 05/2017 & pleural effusion s/p  Thoracentesis 07/2017.  She is O2 dependent and was a DNR prior to this admission on hospice care.  While at home her grandson noted that she was confused and removed her oxygen.  Subsequently she went into a cardiac arrest he performed CPR EMS arrived he was given 1 dose of epi with return of spontaneous circulation.  She was intubated and transported to [facility].  Her DNR was reversed.  She is noted to have a hemoglobin of 6.  She is currently hemodynamically stable not on pressors.  Pulmonary critical care called to admit she will not be a hypothermia candidate. Note receiving MSO4 for palliation. Still smokes.   SUBJECTIVE:  Low gr febrile Unresponsive   VITAL SIGNS: BP (!) 97/58   Pulse 94   Temp 99.7 F (37.6 C)   Resp 18   Ht 5\' 4"  (1.626 m)   Wt 130 lb 4.7 oz (59.1 kg)   SpO2 100%   BMI 22.36 kg/m   HEMODYNAMICS:    VENTILATOR SETTINGS: Vent Mode: PRVC FiO2 (%):  [40 %-100 %] 40 % Set Rate:  [18 bmp-24 bmp] 18 bmp Vt Set:  [400 mL] 400 mL PEEP:  [5 cmH20] 5 cmH20 Plateau Pressure:  [20 cmH20-27 cmH20] 20 cmH20  INTAKE / OUTPUT: I/O last 3 completed shifts: In: 1940 [I.V.:1050; NG/GT:30; IV Piggyback:860] Out: 830 [Urine:830]  PHYSICAL EXAMINATION: General: Frail elderly female sedated on vent Neuro: Withdraws to pain , does not follows commands HEENT: Pupils BL equal reactive  JVD 1+ Cardiovascular: Heart sounds are regular Lungs: Decreased breath sounds in the bases Abdomen: Soft nontender multiple areas petechiae noted Musculoskeletal: Grossly intact Skin: Multiple areas of ecchymoses throughout the  body  LABS:  BMET Recent Labs  Lab 09/18/2017 0723 08/31/2017 0726 09/16/2017 0909 09/15/17 0500  NA 140 140  --  131*  K 3.1* 3.1*  --  4.4  CL 101 93*  --  79*  CO2 30  --   --  39*  BUN 8 6  --  19  CREATININE 0.61 0.60 0.88 1.15*  GLUCOSE 101* 99  --  115*    Electrolytes Recent Labs  Lab 09/20/2017 0723 09/15/17 0500  CALCIUM 5.5* 8.5*    CBC Recent Labs  Lab 08/21/2017 0723 08/30/2017 0726 08/28/2017 0909 09/15/17 0500  WBC 7.9  --  11.8* 11.4*  HGB 6.0* 5.4* 8.5* 7.8*  HCT 19.3* 16.0* 27.8* 24.7*  PLT 113*  --  163 114*    Coag's Recent Labs  Lab 09/13/2017 0723  INR 1.49    Sepsis Markers Recent Labs  Lab 09/20/2017 0909 08/28/2017 0921 09/03/2017 1137 09/05/2017 1505  LATICACIDVEN  --  2.14* 3.7* 1.3  PROCALCITON <0.10  --   --   --     ABG Recent Labs  Lab 09/06/2017 1330 09/15/17 0410  PHART 7.502* 7.560*  PCO2ART 71.4* 54.5*  PO2ART 419.0* 70.9*    Liver Enzymes Recent Labs  Lab 08/23/2017 0723  AST 22  ALT 17  ALKPHOS 47  BILITOT 0.5  ALBUMIN 1.9*    Cardiac Enzymes No results for input(s): TROPONINI, PROBNP in the last 168 hours.  Glucose No results for input(s): GLUCAP in the last 168 hours.  Imaging No results found.   STUDIES:    CULTURES: None  ANTIBIOTICS: None  SIGNIFICANT EVENTS: March 28, 2018 PEA arrest  LINES/TUBES: March 28, 2018 endotracheal tube>> March 28, 2018 left internal jugular CVL>> March 28, 2018 right radial arterial line pending>>  DISCUSSION:  67 year old female with CABG 05/2017  Gold stage IV COPD   She is O2 dependent and was a DNR prior to this admission.  Adm with PEA arrest. Her DNR was reversed.  She is noted to have a hemoglobin of 6.  Concern for anoxic damage  ASSESSMENT / PLAN:  NEUROLOGIC A:   Anoxic encephalopathy P:   RASS goal: 0 Head CT today & EEG  PULMONARY A: Acute respiratory failure p- cardiac arrest with PEA COPD Gold stage IV S/p Rt thoracentesis Resp alkalosis on vent P:   SBTs  but no extubation Bronchodilators as needed No need for steroids Drop RR 12  CARDIOVASCULAR A:  PEA arrest March 28, 2018 most likely from pulmonary cause Rhythm reestablished with 1 dose of epinephrine and CPR TEE on 09/06/2016 shows LV function is low normal EF 50%  P: maintain BP   RENAL Lab Results  Component Value Date   CREATININE 1.15 (H) 09/15/2017   CREATININE 0.88 0August 08, 2019   CREATININE 0.60 0August 08, 2019    Recent Labs  Lab 02/07/2018 0723 02/07/2018 0726 09/15/17 0500  NA 140 140 131*    Recent Labs  Lab 02/07/2018 0723 02/07/2018 0726 09/15/17 0500  K 3.1* 3.1* 4.4    A:   Hypokalemia Hypocalcemia P:   Replete electrolytes as needed  GASTROINTESTINAL A:   History of gastroesophageal reflux disease P:   Orogastric tube PPI  HEMATOLOGIC Recent Labs    02/07/2018 0909 09/15/17 0500  HGB 8.5* 7.8*    A:   Anemia, initial value 5.4 likely erroneous P:  Type and crossmatch and transfuse for hemoglobin less than 7.  INFECTIOUS A:   No overt infections P:   Monitor white count temperature  ENDOCRINE A:   No acute issues P:   Monitor glucose    FAMILY  - Updates: family at bedside  - Inter-disciplinary family meet or Palliative Care meeting due by:  day 7  The patient is critically ill with multiple organ systems failure and requires high complexity decision making for assessment and support, frequent evaluation and titration of therapies, application of advanced monitoring technologies and extensive interpretation of multiple databases. Critical Care Time devoted to patient care services described in this note independent of APP/resident  time is 35 minutes.    Cyril Mourningakesh [name] MD. Tonny BollmanFCCP. [facility] Pager [phone] If no response call [phone]    09/15/2017, 10:29 AM

## 2017-09-15 NOTE — Progress Notes (Signed)
Inserted an oral airway to prevent patient from biting tongue or cheek.  Pt is grinding her teeth and clamping down on ET tube more frequently.

## 2017-09-15 NOTE — Progress Notes (Signed)
Pharmacy Antibiotic Note  Samantha Dyer is a 67 y.o. female admitted on 11/24/2017 s/p cardiac arrest. Pharmacy consulted for vancomycin and cefepime dosing for possible PNA. Renal function worsening overnight, cultures pending.  Plan: -Continue vancomycin 500mg  IV q12h -Adjust cefepime to 2g IV q24h -Monitor renal funx, cultures, length of therapy, prognosis  Height: 5\' 4"  (162.6 cm) Weight: 130 lb 4.7 oz (59.1 kg) IBW/kg (Calculated) : 54.7  Temp (24hrs), Avg:99.1 F (37.3 C), Min:95.4 F (35.2 C), Max:101.5 F (38.6 C)  Recent Labs  Lab 2018-05-24 0723 2018-05-24 0726 2018-05-24 0727 2018-05-24 0909 2018-05-24 0921 2018-05-24 1137 2018-05-24 1505 09/15/17 0500  WBC 7.9  --   --  11.8*  --   --   --  11.4*  CREATININE 0.61 0.60  --  0.88  --   --   --  1.15*  LATICACIDVEN  --   --  2.38*  --  2.14* 3.7* 1.3  --     Estimated Creatinine Clearance: 41.6 mL/min (A) (by C-G formula based on SCr of 1.15 mg/dL (H)).    Allergies  Allergen Reactions  . Amoxicillin-Pot Clavulanate Swelling  . Augmentin [Amoxicillin-Pot Clavulanate] Swelling    Antimicrobials this admission: 1/25 vancomycin >>  1/25 cefepime >>   Dose adjustments this admission: N/A  Microbiology results: 1/25 BCx: x 2 pending 1/25 UCx: pending  Thank you for allowing pharmacy to be a part of this patient's care.  Mosetta AnisMichael T Staley Lunz, PharmD 09/15/2017 10:36 AM

## 2017-09-15 NOTE — Progress Notes (Signed)
Transported pt to and from CT Scan on vent with 100% FIO2.  Tolerated well.

## 2017-09-15 NOTE — Progress Notes (Signed)
Patient left IJ CVC became dislodged during CT.  IVF stopped, MD notified, and line removed.  VSS. [name], Samantha Dyer

## 2017-09-15 NOTE — Plan of Care (Signed)
  Not Progressing Education: Knowledge of General Education information will improve 09/15/2017 2325 - Not Progressing by Derek Moundennis, Etheridge Geil A, RN Note Pt remains intubated and unresponsive. Family being educated.  Health Behavior/Discharge Planning: Ability to manage health-related needs will improve 09/15/2017 2325 - Not Progressing by Derek Moundennis, Murial Beam A, RN Clinical Measurements: Ability to maintain clinical measurements within normal limits will improve 09/15/2017 2325 - Not Progressing by Derek Moundennis, Kooper Godshall A, RN Will remain free from infection 09/15/2017 2325 - Not Progressing by Derek Moundennis, Nahara Dona A, RN Diagnostic test results will improve 09/15/2017 2325 - Not Progressing by Derek Moundennis, Tarvares Lant A, RN Respiratory complications will improve 09/15/2017 2325 - Not Progressing by Derek Moundennis, Jayne Peckenpaugh A, RN Cardiovascular complication will be avoided 09/15/2017 2325 - Not Progressing by Derek Moundennis, Deissy Guilbert A, RN Activity: Risk for activity intolerance will decrease 09/15/2017 2325 - Not Progressing by Derek Moundennis, Jaicob Dia A, RN Nutrition: Adequate nutrition will be maintained 09/15/2017 2325 - Not Progressing by Derek Moundennis, Starkisha Tullis A, RN Coping: Level of anxiety will decrease 09/15/2017 2325 - Not Progressing by Derek Moundennis, Krupa Stege A, RN Elimination: Will not experience complications related to bowel motility 09/15/2017 2325 - Not Progressing by Derek Moundennis, Charls Custer A, RN Will not experience complications related to urinary retention 09/15/2017 2325 - Not Progressing by Derek Moundennis, Dawnetta Copenhaver A, RN Pain Managment: General experience of comfort will improve 09/15/2017 2325 - Not Progressing by Derek Moundennis, Dean Goldner A, RN Safety: Ability to remain free from injury will improve 09/15/2017 2325 - Not Progressing by Derek Moundennis, Tomer Chalmers A, RN Skin Integrity: Risk for impaired skin integrity will decrease 09/15/2017 2325 - Not Progressing by Derek Moundennis, Quanah Majka A, RN

## 2017-09-16 LAB — URINE CULTURE

## 2017-09-16 MED FILL — Medication: Qty: 1 | Status: AC

## 2017-09-16 NOTE — Progress Notes (Signed)
PULMONARY / CRITICAL CARE MEDICINE   Name: Samantha Dyer MRN: [id number] DOB: 07-20-1951    ADMISSION DATE:  09/23/2017   REFERRING MD: Emergency department physician  CHIEF COMPLAINT: PEA arrest  HISTORY OF PRESENT ILLNESS:   67 year old female with significant pulmonary history of Gold stage IV COPD with CABG 05/2017 & pleural effusion s/p  Thoracentesis 07/2017.  She is O2 dependent and was a DNR prior to this admission on hospice care.  While at home her grandson noted that she was confused and removed her oxygen.  Subsequently she went into a cardiac arrest he performed CPR EMS arrived he was given 1 dose of epi with return of spontaneous circulation.  She was intubated and transported to [facility].  Her DNR was reversed.  She is noted to have a hemoglobin of 6.  She is currently hemodynamically stable not on pressors.  Pulmonary critical care called to admit she will not be a hypothermia candidate. Note receiving MSO4 for palliation. Still smokes.   SUBJECTIVE:  Afebrile Remains comatose , intermittent eys open per family  VITAL SIGNS: BP (!) 100/59   Pulse (!) 109   Temp 99 F (37.2 C)   Resp 15   Ht 5\' 4"  (1.626 m)   Wt 125 lb 10.6 oz (57 kg)   SpO2 98%   BMI 21.57 kg/m   HEMODYNAMICS:    VENTILATOR SETTINGS: Vent Mode: PRVC FiO2 (%):  [40 %] 40 % Set Rate:  [18 bmp] 18 bmp Vt Set:  [400 mL] 400 mL PEEP:  [5 cmH20] 5 cmH20 Plateau Pressure:  [17 cmH20-29 cmH20] 17 cmH20  INTAKE / OUTPUT: I/O last 3 completed shifts: In: 2320.8 [I.V.:1840.8; NG/GT:30; IV Piggyback:450] Out: 1560 [Urine:1410; Emesis/NG output:150]  PHYSICAL EXAMINATION: General: Frail elderly female sedated on vent Neuro: some posturing to pain , does not follows commands HEENT: Pupils BL equal reactive  JVD 1+ Cardiovascular: Heart sounds are regular Lungs: Decreased breath sounds in the bases Abdomen: Soft nontender multiple areas petechiae noted Musculoskeletal: Grossly  intact Skin: Multiple areas of ecchymoses throughout the body  LABS:  BMET Recent Labs  Lab September 23, 2017 0723 09/23/17 0726 09-23-17 0909 09/15/17 0500  NA 140 140  --  131*  K 3.1* 3.1*  --  4.4  CL 101 93*  --  79*  CO2 30  --   --  39*  BUN 8 6  --  19  CREATININE 0.61 0.60 0.88 1.15*  GLUCOSE 101* 99  --  115*    Electrolytes Recent Labs  Lab 23-Sep-2017 0723 09/15/17 0500  CALCIUM 5.5* 8.5*    CBC Recent Labs  Lab 23-Sep-2017 0723 09/23/17 0726 Sep 23, 2017 0909 09/15/17 0500  WBC 7.9  --  11.8* 11.4*  HGB 6.0* 5.4* 8.5* 7.8*  HCT 19.3* 16.0* 27.8* 24.7*  PLT 113*  --  163 114*    Coag's Recent Labs  Lab 2017-09-23 0723  INR 1.49    Sepsis Markers Recent Labs  Lab September 23, 2017 0909 September 23, 2017 0921 2017-09-23 1137 23-Sep-2017 1505  LATICACIDVEN  --  2.14* 3.7* 1.3  PROCALCITON <0.10  --   --   --     ABG Recent Labs  Lab 09-23-2017 1330 09/15/17 0410  PHART 7.502* 7.560*  PCO2ART 71.4* 54.5*  PO2ART 419.0* 70.9*    Liver Enzymes Recent Labs  Lab 2017-09-23 0723  AST 22  ALT 17  ALKPHOS 47  BILITOT 0.5  ALBUMIN 1.9*    Cardiac Enzymes No results for input(s): TROPONINI,  PROBNP in the last 168 hours.  Glucose No results for input(s): GLUCAP in the last 168 hours.  Imaging Ct Head Wo Contrast  Result Date: 09/15/2017 CLINICAL DATA:  Altered level of consciousness EXAM: CT HEAD WITHOUT CONTRAST TECHNIQUE: Contiguous axial images were obtained from the base of the skull through the vertex without intravenous contrast. COMPARISON:  Brain MRI 07/19/2006 FINDINGS: Brain: No evidence of acute infarction, hemorrhage, hydrocephalus, or intra-axial mass. Low-density in the cerebral white matter attributed to chronic small vessel ischemia given patient's history, progressed from 2007 brain MRI. Lateral left cerebellar arachnoid cyst measuring 3 x 1.5 cm, with mild local mass effect. Vascular: Atherosclerotic calcification.  No hyperdense vessel. Skull: Negative  Sinuses/Orbits: Bilateral cataract resection.  No acute finding. IMPRESSION: 1. No acute finding. 2. Presumed chronic small vessel ischemia in the cerebral white matter that has progressed from 2007. 3. Small, incidental arachnoid cyst lateral to the left cerebellum. Electronically Signed   By: Marnee Spring M.D.   On: 09/15/2017 16:54     STUDIES:    CULTURES: None  ANTIBIOTICS: None  SIGNIFICANT EVENTS: October 12, 2017 PEA arrest  LINES/TUBES: Oct 12, 2017 endotracheal tube>> 10-12-2017 left internal jugular CVL>> 2017/10/12 right radial arterial line pending>>  DISCUSSION:  67 year old female with CABG 05/2017  Gold stage IV COPD   She is O2 dependent and was a DNR prior to this admission.  Adm with PEA arrest. Her DNR was reversed.  Concern for anoxic damage. Poor prognosis for meaningful recovery given to family   ASSESSMENT / PLAN:  NEUROLOGIC A:   Anoxic encephalopathy Head CT neg P:   RASS goal: 0 Await EEG Poor prognosis for meaningful recovery given to family  PULMONARY A: Acute respiratory failure p- cardiac arrest with PEA COPD Gold stage IV S/p Rt thoracentesis Resp alkalosis on vent P:   SBTs but no extubation Bronchodilators as needed No need for steroids   CARDIOVASCULAR A:  PEA arrest 2017/10/12 most likely from pulmonary cause Rhythm reestablished with 1 dose of epinephrine and CPR TEE on 09/06/2016 shows LV function is low normal EF 50%  P: maintain BP   RENAL Lab Results  Component Value Date   CREATININE 1.15 (H) 09/15/2017   CREATININE 0.88 2017/10/12   CREATININE 0.60 2017-10-12    Recent Labs  Lab 10-12-2017 0723 2017/10/12 0726 09/15/17 0500  NA 140 140 131*    Recent Labs  Lab 12-Oct-2017 0723 10-12-2017 0726 09/15/17 0500  K 3.1* 3.1* 4.4    A:  Hyponatremia  Hypokalemia Hypocalcemia P:   Replete electrolytes as needed  GASTROINTESTINAL A:   History of gastroesophageal reflux disease P:   Orogastric  tube PPI  HEMATOLOGIC Recent Labs    10/12/2017 0909 09/15/17 0500  HGB 8.5* 7.8*    A:   Anemia, initial value 5.4 likely erroneous P:  Type and crossmatch and transfuse for hemoglobin less than 7.  INFECTIOUS A:   No overt infections P:   Monitor white count temperature  ENDOCRINE A:   No acute issues P:   Monitor glucose    FAMILY  - Updates: family at bedside  - Inter-disciplinary family meet or Palliative Care meeting due by:  1/27 Poor prognosis for meaningful recovery given to family. Waiting on them to indicate when ready for vent withdrawal   The patient is critically ill with multiple organ systems failure and requires high complexity decision making for assessment and support, frequent evaluation and titration of therapies, application of advanced monitoring technologies and extensive interpretation of  multiple databases. Critical Care Time devoted to patient care services described in this note independent of APP/resident  time is 35 minutes.    Cyril Mourningakesh Vance Hochmuth MD. Tonny BollmanFCCP. [facility] Pager [phone] If no response call [phone]    09/16/2017, 11:53 AM

## 2017-09-16 NOTE — Progress Notes (Signed)
Elink MD notified regarding no lab orders. Stated will address on AM rounds. Will continue to monitor closely. Modena JanskyKevin Makalyn Lennox Rn 2 Heart

## 2017-09-16 NOTE — Progress Notes (Signed)
PHARMACY - PHYSICIAN COMMUNICATION CRITICAL VALUE ALERT - BLOOD CULTURE IDENTIFICATION (BCID)  Results for orders placed or performed during the hospital encounter of 09/17/2017  Blood Culture ID Panel (Reflexed) (Collected: 09/07/2017  7:15 AM)  Result Value Ref Range   Enterococcus species NOT DETECTED NOT DETECTED   Listeria monocytogenes NOT DETECTED NOT DETECTED   Staphylococcus species NOT DETECTED NOT DETECTED   Staphylococcus aureus NOT DETECTED NOT DETECTED   Streptococcus species NOT DETECTED NOT DETECTED   Streptococcus agalactiae NOT DETECTED NOT DETECTED   Streptococcus pneumoniae NOT DETECTED NOT DETECTED   Streptococcus pyogenes NOT DETECTED NOT DETECTED   Acinetobacter baumannii NOT DETECTED NOT DETECTED   Enterobacteriaceae species NOT DETECTED NOT DETECTED   Enterobacter cloacae complex NOT DETECTED NOT DETECTED   Escherichia coli NOT DETECTED NOT DETECTED   Klebsiella oxytoca NOT DETECTED NOT DETECTED   Klebsiella pneumoniae NOT DETECTED NOT DETECTED   Proteus species NOT DETECTED NOT DETECTED   Serratia marcescens NOT DETECTED NOT DETECTED   Haemophilus influenzae NOT DETECTED NOT DETECTED   Neisseria meningitidis NOT DETECTED NOT DETECTED   Pseudomonas aeruginosa NOT DETECTED NOT DETECTED   Candida albicans NOT DETECTED NOT DETECTED   Candida glabrata NOT DETECTED NOT DETECTED   Candida krusei NOT DETECTED NOT DETECTED   Candida parapsilosis NOT DETECTED NOT DETECTED   Candida tropicalis NOT DETECTED NOT DETECTED    Name of physician (or Provider) Contacted: Dr. Vassie [name]  Changes to prescribed antibiotics required: Keep cefepime for now until cultures come back, stop vancomycin.  Milus MallickMargaret E Mountain Empire Surgery Centerhuda 09/16/2017  8:06 AM

## 2017-09-17 ENCOUNTER — Other Ambulatory Visit (HOSPITAL_COMMUNITY): Payer: Medicare Other

## 2017-09-17 ENCOUNTER — Inpatient Hospital Stay (HOSPITAL_COMMUNITY): Payer: Medicare Other

## 2017-09-17 DIAGNOSIS — R402433 Glasgow coma scale score 3-8, at hospital admission: Secondary | ICD-10-CM

## 2017-09-17 DIAGNOSIS — R40243 Glasgow coma scale score 3-8, unspecified time: Secondary | ICD-10-CM

## 2017-09-17 LAB — CULTURE, BLOOD (ROUTINE X 2): Special Requests: ADEQUATE

## 2017-09-17 LAB — GLUCOSE, CAPILLARY: Glucose-Capillary: 88 mg/dL (ref 65–99)

## 2017-09-17 MED ORDER — SODIUM CHLORIDE 0.9 % IV SOLN
500.0000 mg | Freq: Two times a day (BID) | INTRAVENOUS | Status: DC
  Administered 2017-09-17 – 2017-09-18 (×3): 500 mg via INTRAVENOUS
  Filled 2017-09-17 (×3): qty 5

## 2017-09-17 MED ORDER — DILTIAZEM HCL-DEXTROSE 100-5 MG/100ML-% IV SOLN (PREMIX)
5.0000 mg/h | INTRAVENOUS | Status: DC
  Administered 2017-09-17: 15 mg/h via INTRAVENOUS
  Administered 2017-09-17: 8 mg/h via INTRAVENOUS
  Administered 2017-09-18 (×2): 10 mg/h via INTRAVENOUS
  Filled 2017-09-17 (×4): qty 100

## 2017-09-17 MED ORDER — LACTATED RINGERS IV BOLUS (SEPSIS)
500.0000 mL | Freq: Once | INTRAVENOUS | Status: AC
  Administered 2017-09-17: 500 mL via INTRAVENOUS

## 2017-09-17 MED ORDER — VITAL HIGH PROTEIN PO LIQD
1000.0000 mL | ORAL | Status: DC
  Administered 2017-09-17 – 2017-09-19 (×5): 1000 mL
  Filled 2017-09-17: qty 1000

## 2017-09-17 NOTE — Progress Notes (Signed)
Initial Nutrition Assessment  DOCUMENTATION CODES:   Severe malnutrition in context of chronic illness  INTERVENTION:   - Initiate TF of Vital High Protein at 55 mL/hour (1320 mL/day).  Tube feeding regimen provides 1320 total kcal (99% estimated energy needs), 115 grams protein (104.5% high end of estimated protein needs), and 1003 mL free water.  NUTRITION DIAGNOSIS:   Severe Malnutrition related to chronic illness(COPD, CAD) as evidenced by severe muscle depletion, moderate fat depletion, percent weight loss.  GOAL:   Patient will meet greater than or equal to 90% of their needs  MONITOR:   TF tolerance, Vent status, Skin  REASON FOR ASSESSMENT:   Consult Enteral/tube feeding initiation and management  ASSESSMENT:   67 year old female with PMH significant for COPD with CABG 05/2017, pleural effusion s/p thoracentesis 07/2017, CAD, and anemia. Pt admitted for cardiac arrest.  Patient is currently intubated on ventilator support MV: 7.0 L/min Temp (24hrs), Avg:99.5 F (37.5 C), Min:99 F (37.2 C), Max:99.9 F (37.7 C)  Pt remains unresponsive and comatose off sedation. Per MD note, pt with poor prognosis for meaningful recovery. Waiting on family to indicate when they are ready for vent withdrawal. Consult to start TF.  Pt with OG tube on low intermittent suction. Spoke with RN who reports approximately 400 mL output per OG tube.  Per weight history in chart, pt with 10 lb weight loss in less than 3 months (7.6% weight loss, significant for timeframe).  I/O's: +2.2 L since admission  Medications reviewed and include: 40 mg Protonix  Labs reviewed: sodium 131 (L), creatinine 1.15 (H), calcium 8.5 (L)  NUTRITION - FOCUSED PHYSICAL EXAM:    Most Recent Value  Orbital Region  Moderate depletion  Upper Arm Region  Unable to assess  Thoracic and Lumbar Region  Mild depletion  Buccal Region  Unable to assess  Temple Region  Moderate depletion  Clavicle Bone  Region  Moderate depletion  Clavicle and Acromion Bone Region  Severe depletion  Scapular Bone Region  Unable to assess  Dorsal Hand  Unable to assess  Patellar Region  Severe depletion  Anterior Thigh Region  Severe depletion  Posterior Calf Region  Moderate depletion  Edema (RD Assessment)  Mild  Hair  Reviewed  Eyes  Unable to assess  Mouth  Unable to assess  Skin  Reviewed  Nails  Reviewed       Diet Order:  No diet orders on file  EDUCATION NEEDS:   Not appropriate for education at this time  Skin:  Skin Assessment: Skin Integrity Issues: Skin Integrity Issues:: DTI DTI: coccyx  Last BM:  PTA  Height:   Ht Readings from Last 1 Encounters:  06/22/2018 5\' 4"  (1.626 m)    Weight:   Wt Readings from Last 1 Encounters:  09/17/17 121 lb 0.5 oz (54.9 kg)    Ideal Body Weight:  54.5 kg  BMI:  Body mass index is 20.78 kg/m.  Estimated Nutritional Needs:   Kcal:  1337 kcal/day  Protein:  95-110 grams/day  Fluid:  1.4-1.6 L/day    Earma ReadingKate Jablonski Zamariah Seaborn, MS, RD, LDN Pager: [phone] Weekend/After Hours: [phone]

## 2017-09-17 NOTE — Progress Notes (Signed)
EEG completed, results pending. 

## 2017-09-17 NOTE — Progress Notes (Signed)
PULMONARY / CRITICAL CARE MEDICINE   Name: XIAN APOSTOL MRN: [id number] DOB: Jul 21, 1951    ADMISSION DATE:  2017-10-05   REFERRING MD: Emergency department physician  CHIEF COMPLAINT: PEA arrest  HISTORY OF PRESENT ILLNESS:   67 year old female with significant pulmonary history of Gold stage IV COPD with CABG 05/2017 & pleural effusion s/p  Thoracentesis 07/2017.  She is O2 dependent and was a DNR prior to this admission on hospice care.  While at home her grandson noted that she was confused and removed her oxygen.  Subsequently she went into a cardiac arrest he performed CPR EMS arrived he was given 1 dose of epi with return of spontaneous circulation.  She was intubated and transported to [facility].  Her DNR was reversed.  She is noted to have a hemoglobin of 6.  She is currently hemodynamically stable not on pressors.  Pulmonary critical care called to admit she will not be a hypothermia candidate. Note receiving MSO4 for palliation. Still smokes.    STUDIES:    CULTURES: None  ANTIBIOTICS: None  LINES/TUBES: 05-Oct-2017 endotracheal tube>> 2017-10-05 left internal jugular CVL>> 10/05/2017 right radial arterial line pending>>   SIGNIFICANT EVENTS: 05-Oct-2017 PEA arrest  1/27 - Afebrile. remains comatose , intermittent eys open per family   SUBJECTIVE/OVERNIGHT/INTERVAL HX 1/28 - remains comatose off sedation. Per RN family not at bedside . Accepting prognosis but want to give it few days or so  VITAL SIGNS: BP (!) 143/81   Pulse (!) 110   Temp 99.3 F (37.4 C) (Bladder)   Resp (!) 21   Ht 5\' 4"  (1.626 m)   Wt 121 lb 0.5 oz (54.9 kg)   SpO2 97%   BMI 20.78 kg/m   HEMODYNAMICS:    VENTILATOR SETTINGS: Vent Mode: PRVC FiO2 (%):  [40 %] 40 % Set Rate:  [12 bmp] 12 bmp Vt Set:  [400 mL] 400 mL PEEP:  [5 cmH20] 5 cmH20 Plateau Pressure:  [15 cmH20-24 cmH20] 24 cmH20  INTAKE / OUTPUT: I/O last 3 completed shifts: In: 2050.8 [I.V.:1790.8;  NG/GT:60; IV Piggyback:200] Out: 1485 [Urine:1285; Emesis/NG output:200]  PHYSICAL EXAMINATION:  General Appearance:    Looks criticall ill  Head:    Normocephalic, without obvious abnormality, atraumatic  Eyes:    PERRL - NO, conjunctiva/corneas - Clear and has corneal reflext      Ears:    Normal external ear canals, both ears  Nose:   NG tube - no  Throat:  ETT TUBE - yes , OG tube - yes . HAS GAG +  Neck:   Supple,  No enlargement/tenderness/nodules     Lungs:     Clear to auscultation bilaterally, Ventilator   Synchrony - yes  Chest wall:    No deformity  Heart:    S1 and S2 normal, no murmur, CVP - no.  Pressors - no  Abdomen:     Soft, no masses, no organomegaly  Genitalia:    Not done  Rectal:   not done  Extremities:   Extremities- intact but has edema     Skin:   Intact in exposed areas . Sacral area - pre-admit has sacral decub per RN     Neurologic:   Sedation - none -> RASS - -3 . Moves all 4s - moves RLE slight but rest none per RN. CAM-ICU - x . Orientation - x     PULMONARY Recent Labs  Lab 2017-10-05 0726 2017-10-05 1330 09/15/17 0410  PHART  --  7.502* 7.560*  PCO2ART  --  71.4* 54.5*  PO2ART  --  419.0* 70.9*  HCO3  --  56.2* 48.8*  TCO2 31 >50*  --   O2SAT  --  100.0 95.5    CBC Recent Labs  Lab 09/11/2017 0723 09/10/2017 0726 08/22/2017 0909 09/15/17 0500  HGB 6.0* 5.4* 8.5* 7.8*  HCT 19.3* 16.0* 27.8* 24.7*  WBC 7.9  --  11.8* 11.4*  PLT 113*  --  163 114*    COAGULATION Recent Labs  Lab 08/22/2017 0723  INR 1.49    CARDIAC  No results for input(s): TROPONINI in the last 168 hours. No results for input(s): PROBNP in the last 168 hours.   CHEMISTRY Recent Labs  Lab 08/24/2017 0723 09/05/2017 0726 08/24/2017 0909 09/15/17 0500  NA 140 140  --  131*  K 3.1* 3.1*  --  4.4  CL 101 93*  --  79*  CO2 30  --   --  39*  GLUCOSE 101* 99  --  115*  BUN 8 6  --  19  CREATININE 0.61 0.60 0.88 1.15*  CALCIUM 5.5*  --   --  8.5*   Estimated  Creatinine Clearance: 41.6 mL/min (A) (by C-G formula based on SCr of 1.15 mg/dL (H)).   LIVER Recent Labs  Lab 09/11/2017 0723  AST 22  ALT 17  ALKPHOS 47  BILITOT 0.5  PROT 3.0*  ALBUMIN 1.9*  INR 1.49     INFECTIOUS Recent Labs  Lab 09/13/2017 0909 08/29/2017 0921 09/07/2017 1137 09/01/2017 1505  LATICACIDVEN  --  2.14* 3.7* 1.3  PROCALCITON <0.10  --   --   --      ENDOCRINE CBG (last 3)  No results for input(s): GLUCAP in the last 72 hours.       IMAGING x48h  - image(s) personally visualized  -   highlighted in bold Ct Head Wo Contrast  Result Date: 09/15/2017 CLINICAL DATA:  Altered level of consciousness EXAM: CT HEAD WITHOUT CONTRAST TECHNIQUE: Contiguous axial images were obtained from the base of the skull through the vertex without intravenous contrast. COMPARISON:  Brain MRI 07/19/2006 FINDINGS: Brain: No evidence of acute infarction, hemorrhage, hydrocephalus, or intra-axial mass. Low-density in the cerebral white matter attributed to chronic small vessel ischemia given patient's history, progressed from 2007 brain MRI. Lateral left cerebellar arachnoid cyst measuring 3 x 1.5 cm, with mild local mass effect. Vascular: Atherosclerotic calcification.  No hyperdense vessel. Skull: Negative Sinuses/Orbits: Bilateral cataract resection.  No acute finding. IMPRESSION: 1. No acute finding. 2. Presumed chronic small vessel ischemia in the cerebral white matter that has progressed from 2007. 3. Small, incidental arachnoid cyst lateral to the left cerebellum. Electronically Signed   By: Marnee SpringJonathon  Watts M.D.   On: 09/15/2017 16:54        DISCUSSION:  67 year old female with CABG 05/2017  Gold stage IV COPD   She is O2 dependent and was a DNR prior to this admission.  Adm with PEA arrest. Her DNR was reversed.  Concern for anoxic damage. Poor prognosis for meaningful recovery given to family   ASSESSMENT / PLAN:  NEUROLOGIC A:   Anoxic encephalopathy: Head CT neg  -  09/17/2017 - remains unresponsive with some reflexes. EEG ongoing P:   RASS goal: 0 Await EEG resul.t Poor prognosis for meaningful recovery given to family 1/27  PULMONARY A: Acute respiratory failure p- cardiac arrest with PEA COPD Gold stage IV S/p Rt thoracentesis Resp alkalosis on vent  09/17/2017 - > does NOT meet criteria for SBT/Extubation in setting of Acute Respiratory Failure due to encephalopathy Failed sBT    P:   PRVC; no extubation Bronchodilators as needed No need for steroids   CARDIOVASCULAR A:  PEA arrest 08/28/2017 most likely from pulmonary cause Rhythm reestablished with 1 dose of epinephrine and CPR TEE on 09/06/2016 shows LV function is low normal EF 50%  09/17/2017 ->  NOT on Pressor    P:  maintain BP Dc aline   RENAL   A:  Mild AKI 09/17/2017  P:   Replete electrolytes as needed LR bolus x 1  GASTROINTESTINAL A:   History of gastroesophageal reflux disease P:   Orogastric tube PPI Start TF  HEMATOLOGIC Recent Labs  Lab 09/02/2017 0723 09/20/2017 0726 08/23/2017 0909 09/15/17 0500  HGB 6.0* 5.4* 8.5* 7.8*  HCT 19.3* 16.0* 27.8* 24.7*  WBC 7.9  --  11.8* 11.4*  PLT 113*  --  163 114*     A:   Anemia, initial value 5.4 likely erroneous. Improved 09/17/2017 wihtout PRBC P:  - PRBC for hgb </= 6.9gm%    - exceptions are   -  if ACS susepcted/confirmed then transfuse for hgb </= 8.0gm%,  or    -  active bleeding with hemodynamic instability, then transfuse regardless of hemoglobin value   At at all times try to transfuse 1 unit prbc as possible with exception of active hemorrhage    INFECTIOUS A:   No overt infections P:   Monitor white count temperature  ENDOCRINE A:   No acute issues P:   Monitor glucose    FAMILY  - Updates: family at bedside 07/27/18. None at bedside 09/17/2017   - Inter-disciplinary family meet or Palliative Care meeting due by:  1/27 Poor prognosis for meaningful recovery given to family.  Waiting on them to indicate when ready for vent withdrawal   The patient is critically ill with multiple organ systems failure and requires high complexity decision making for assessment and support, frequent evaluation and titration of therapies, application of advanced monitoring technologies and extensive interpretation of multiple databases.   Critical Care Time devoted to patient care services described in this note is  30  Minutes. This time reflects time of care of this signee Dr Kalman Shan. This critical care time does not reflect procedure time, or teaching time or supervisory time of PA/NP/Med student/Med Resident etc but could involve care discussion time    Dr. Kalman Shan, M.D., [facility].C.P Pulmonary and Critical Care Medicine Staff Physician [facility] Pager: [phone], If no answer or between  15:00h - 7:00h: call [phone]  09/17/2017 9:16 AM

## 2017-09-17 NOTE — Significant Event (Signed)
Patient is not tolerating PS/CPAP, HR up to 150s, saturations in the 70-80s, BP >190. Patient with increased work of breathing, does not follow commands. RRT notified and patient is placed back on full ventilator support.        Lynden Carrithers

## 2017-09-17 NOTE — Plan of Care (Signed)
  Progressing Clinical Measurements: Ability to maintain clinical measurements within normal limits will improve 09/17/2017 0026 - Progressing by Derek Moundennis, Adhira Jamil A, RN Will remain free from infection 09/17/2017 0026 - Progressing by Derek Moundennis, Taniesha Glanz A, RN Respiratory complications will improve 09/17/2017 0026 - Progressing by Derek Moundennis, Gurpreet Mikhail A, RN Cardiovascular complication will be avoided 09/17/2017 0026 - Progressing by Derek Moundennis, Alecxander Mainwaring A, RN   Not Progressing Education: Knowledge of General Education information will improve 09/17/2017 0026 - Not Progressing by Derek Moundennis, Tecumseh Yeagley A, RN Note Family meeting 1/28. Possible withdrawal of care Health Behavior/Discharge Planning: Ability to manage health-related needs will improve 09/17/2017 0026 - Not Progressing by Derek Moundennis, Deng Kemler A, RN Clinical Measurements: Diagnostic test results will improve 09/17/2017 0026 - Not Progressing by Derek Moundennis, Sandro Burgo A, RN Activity: Risk for activity intolerance will decrease 09/17/2017 0026 - Not Progressing by Derek Moundennis, Tarquin Welcher A, RN Note Pt remains unresponsive Nutrition: Adequate nutrition will be maintained 09/17/2017 0026 - Not Progressing by Derek Moundennis, Jayme Mednick A, RN Coping: Level of anxiety will decrease 09/17/2017 0026 - Not Progressing by Derek Moundennis, Mikalia Fessel A, RN Elimination: Will not experience complications related to bowel motility 09/17/2017 0026 - Not Progressing by Derek Moundennis, Ilyaas Musto A, RN Will not experience complications related to urinary retention 09/17/2017 0026 - Not Progressing by Derek Moundennis, Wyona Neils A, RN Pain Managment: General experience of comfort will improve 09/17/2017 0026 - Not Progressing by Derek Moundennis, Zuzanna Maroney A, RN Safety: Ability to remain free from injury will improve 09/17/2017 0026 - Not Progressing by Derek Moundennis, Lamae Fosco A, RN Skin Integrity: Risk for impaired skin integrity will decrease 09/17/2017 0026 - Not Progressing by Derek Moundennis, Milca Sytsma A, RN

## 2017-09-17 NOTE — Significant Event (Signed)
Patient went into Atrial Flutter/ Atrial Fib RVR with rates up to 150s. MD notified; EKG obtained. New orders to start Cardizem.

## 2017-09-17 NOTE — Progress Notes (Signed)
1. EEG report - d/w Dr Karel JarvisAquino -start keppra 2. EKG with A flutter RVR - start cardizem gtt  Dr. Kalman ShanMurali Saliou Barnier, M.D., [facility].C.P Pulmonary and Critical Care Medicine Staff Physician, [facility] Center Director - Interstitial Lung Disease  Program  Pulmonary Fibrosis [facility] [place], [id number]  Pager: [phone], If no answer or between  15:00h - 7:00h: call [phone] Telephone: [phone]

## 2017-09-17 NOTE — Procedures (Signed)
ELECTROENCEPHALOGRAM REPORT  Date of Study: 09/17/2017  Patient's Name: Samantha Dyer MRN: [id number] Date of Birth: 05-Dec-1950  Referring Provider: Dr. Cyril Mourningakesh [name]  Clinical History: This is a 67 year old woman s/p cardiac arrest.  Medications: Cefepime Tylneol Protonix  Technical Summary: A multichannel digital EEG recording measured by the international 10-20 system with electrodes applied with paste and impedances below 5000 ohms performed in our laboratory with EKG monitoring in an intubated and unresponsive patient.  Hyperventilation and photic stimulation were not performed.  The digital EEG was referentially recorded, reformatted, and digitally filtered in a variety of bipolar and referential montages for optimal display.    Description: The patient is intubated and unresponsive during the recording. No sedating medications listed. At the beginning of the recording, generalized periodic discharges (GPDs) with triphasic appearance are seen at a frequency of 1-2 per second with theta and delta slowing in between discharges. There was no evolution in frequency or amplitude seen, but these discharges wax and wane, with complete disappearance of generalized discharges for 40 seconds and background showing diffuse theta and delta slowing, followed by reappearance of generalized periodic discharges with stimulation. GPDs are seen for another 180 seconds, again followed by disappearance of generalized discharges for another 40-50 seconds, then GPDs return and are seen until the end of the recording.   EKG lead was unremarkable.  Impression: This EEG is markedly abnormal due to generalized periodic discharges with triphasic appearance that wax and wane throughout the recording.   Clinical Correlation of the above findings indicates severe diffuse cerebral dysfunction that is non-specific in etiology and can be seen in the setting of anoxic/ischemic injury or toxic/metabolic  encephalopathies, however with waxing and waning generalized periodic discharges that completely disappear for 40-50 seconds, nonconvulsive status epilepticus is also a possibility. Clinical correlation is advised.   Samantha Dyer, M.D.

## 2017-09-18 ENCOUNTER — Inpatient Hospital Stay (HOSPITAL_COMMUNITY): Payer: Medicare Other

## 2017-09-18 DIAGNOSIS — G40901 Epilepsy, unspecified, not intractable, with status epilepticus: Secondary | ICD-10-CM

## 2017-09-18 LAB — BASIC METABOLIC PANEL
ANION GAP: 11 (ref 5–15)
ANION GAP: 8 (ref 5–15)
BUN: 19 mg/dL (ref 6–20)
BUN: 28 mg/dL — AB (ref 6–20)
CALCIUM: 8 mg/dL — AB (ref 8.9–10.3)
CHLORIDE: 94 mmol/L — AB (ref 101–111)
CO2: 35 mmol/L — AB (ref 22–32)
CO2: 34 mmol/L — AB (ref 22–32)
CREATININE: 0.87 mg/dL (ref 0.44–1.00)
Calcium: 7.8 mg/dL — ABNORMAL LOW (ref 8.9–10.3)
Chloride: 98 mmol/L — ABNORMAL LOW (ref 101–111)
Creatinine, Ser: 0.8 mg/dL (ref 0.44–1.00)
GFR calc Af Amer: >60 mL/min (ref >60)
GFR calc Af Amer: >60 mL/min (ref >60)
GFR calc non Af Amer: >60 mL/min (ref >60)
GFR calc non Af Amer: >60 mL/min (ref >60)
GLUCOSE: 143 mg/dL — AB (ref 65–99)
Glucose, Bld: 98 mg/dL (ref 65–99)
POTASSIUM: 5.4 mmol/L — AB (ref 3.5–5.1)
Potassium: 2.8 mmol/L — ABNORMAL LOW (ref 3.5–5.1)
SODIUM: 140 mmol/L (ref 135–145)
Sodium: 140 mmol/L (ref 135–145)

## 2017-09-18 LAB — GLUCOSE, CAPILLARY
GLUCOSE-CAPILLARY: 121 mg/dL — AB (ref 65–99)
GLUCOSE-CAPILLARY: 141 mg/dL — AB (ref 65–99)
GLUCOSE-CAPILLARY: 91 mg/dL (ref 65–99)
Glucose-Capillary: 150 mg/dL — ABNORMAL HIGH (ref 65–99)
Glucose-Capillary: 148 mg/dL — ABNORMAL HIGH (ref 65–99)
Glucose-Capillary: 124 mg/dL — ABNORMAL HIGH (ref 65–99)

## 2017-09-18 LAB — BPAM RBC
BLOOD PRODUCT EXPIRATION DATE: 201901302359
Blood Product Expiration Date: 201901302359
Blood Product Expiration Date: 201901302359
Blood Product Expiration Date: 201903012359
ISSUE DATE / TIME: 201901270919
ISSUE DATE / TIME: 201901270919
ISSUE DATE / TIME: 201901281345
Unit Type and Rh: 9500
Unit Type and Rh: 9500
Unit Type and Rh: 9500
Unit Type and Rh: 9500

## 2017-09-18 LAB — CBC WITH DIFFERENTIAL/PLATELET
BASOS ABS: 0 10*3/uL (ref 0.0–0.1)
Basophils Relative: 0 %
EOS PCT: 1 %
Eosinophils Absolute: 0 10*3/uL (ref 0.0–0.7)
HCT: 22.8 % — ABNORMAL LOW (ref 36.0–46.0)
HEMOGLOBIN: 7.2 g/dL — AB (ref 12.0–15.0)
Lymphocytes Relative: 7 %
Lymphs Abs: 0.4 10*3/uL — ABNORMAL LOW (ref 0.7–4.0)
MCH: 29.6 pg (ref 26.0–34.0)
MCHC: 31.6 g/dL (ref 30.0–36.0)
MCV: 93.8 fL (ref 78.0–100.0)
MONO ABS: 0.2 10*3/uL (ref 0.1–1.0)
MONOS PCT: 4 %
NEUTROS ABS: 5 10*3/uL (ref 1.7–7.7)
Neutrophils Relative %: 88 %
PLATELETS: 76 10*3/uL — AB (ref 150–400)
RBC: 2.43 MIL/uL — ABNORMAL LOW (ref 3.87–5.11)
RDW: 16.5 % — ABNORMAL HIGH (ref 11.5–15.5)
WBC: 5.6 10*3/uL (ref 4.0–10.5)

## 2017-09-18 LAB — TYPE AND SCREEN
ABO/RH(D): O NEG
ANTIBODY SCREEN: NEGATIVE
UNIT DIVISION: 0
UNIT DIVISION: 0
Unit division: 0
Unit division: 0

## 2017-09-18 LAB — PHOSPHORUS: Phosphorus: 3.6 mg/dL (ref 2.5–4.6)

## 2017-09-18 LAB — MAGNESIUM: MAGNESIUM: 2.2 mg/dL (ref 1.7–2.4)

## 2017-09-18 MED ORDER — VALPROATE SODIUM 500 MG/5ML IV SOLN
500.0000 mg | Freq: Two times a day (BID) | INTRAVENOUS | Status: DC
  Administered 2017-09-18 – 2017-09-19 (×2): 500 mg via INTRAVENOUS
  Filled 2017-09-18 (×4): qty 5

## 2017-09-18 MED ORDER — IPRATROPIUM-ALBUTEROL 0.5-2.5 (3) MG/3ML IN SOLN
3.0000 mL | Freq: Four times a day (QID) | RESPIRATORY_TRACT | Status: DC
  Administered 2017-09-18 – 2017-09-19 (×8): 3 mL via RESPIRATORY_TRACT
  Filled 2017-09-18 (×7): qty 3

## 2017-09-18 MED ORDER — PROPOFOL 1000 MG/100ML IV EMUL
50.0000 ug/kg/min | INTRAVENOUS | Status: DC
  Administered 2017-09-18 – 2017-09-19 (×5): 50 ug/kg/min via INTRAVENOUS
  Filled 2017-09-18 (×5): qty 100

## 2017-09-18 MED ORDER — POTASSIUM CHLORIDE 20 MEQ/15ML (10%) PO SOLN
40.0000 meq | Freq: Once | ORAL | Status: AC
  Administered 2017-09-18: 40 meq via ORAL
  Filled 2017-09-18: qty 30

## 2017-09-18 MED ORDER — POTASSIUM CHLORIDE 20 MEQ/15ML (10%) PO SOLN
40.0000 meq | ORAL | Status: AC
  Administered 2017-09-18 (×2): 40 meq
  Filled 2017-09-18 (×2): qty 30

## 2017-09-18 MED ORDER — IPRATROPIUM-ALBUTEROL 0.5-2.5 (3) MG/3ML IN SOLN: 3.0000 mL | Freq: Three times a day (TID) | RESPIRATORY_TRACT | Status: DC

## 2017-09-18 MED ORDER — SODIUM CHLORIDE 0.9 % IV BOLUS (SEPSIS)
500.0000 mL | Freq: Once | INTRAVENOUS | Status: AC
  Administered 2017-09-18: 500 mL via INTRAVENOUS

## 2017-09-18 MED ORDER — SODIUM CHLORIDE 0.9 % IV SOLN
1000.0000 mg | Freq: Two times a day (BID) | INTRAVENOUS | Status: DC
  Administered 2017-09-19 (×2): 1000 mg via INTRAVENOUS
  Filled 2017-09-18 (×4): qty 10

## 2017-09-18 MED ORDER — VALPROATE SODIUM 500 MG/5ML IV SOLN
1100.0000 mg | Freq: Once | INTRAVENOUS | Status: AC
  Administered 2017-09-18: 1100 mg via INTRAVENOUS
  Filled 2017-09-18: qty 11

## 2017-09-18 NOTE — Progress Notes (Signed)
LTM started; educated family and nurse on event button; notified Dr Thedore MinsSingh.

## 2017-09-18 NOTE — Progress Notes (Signed)
[facility] FOR [facility] LAB REPLACEMENT ONLY  The [facility] does [facility] based on the criteria listed below:   1. Is GFR >/= 40 ml/min? Yes.    Patient's GFR today is >60 2. Is urine output >/= 0.5 ml/kg/hr for the last 6 hours? Yes.   Patient's UOP is 0.55 ml/kg/hr 3. Is BUN < 60 mg/dL? Yes.    Patient's BUN today is 19 4. Abnormal electrolyte(s): Potassium 2.8 5. Ordered repletion with: Potassium per protocol 6. If a panic level lab has been reported, has the CCM MD in charge been notified? Yes.  .   Physician:  Dr. Schuyler AmorSommer  Samantha Dyer 09/18/2017 5:30 AM

## 2017-09-18 NOTE — Care Management Note (Signed)
Case Management Note  Patient Details  Name: Samantha Dyer MRN: [id number] Date of Birth: [birth year]  Subjective/Objective:  From home ,has hx of CABG 05/2017  Gold stage IV COPD   She is O2 dependent and was a DNR prior to this admission.  Adm with PEA arrest. Her DNR was reversed,  Concern for anoxic damage. Poor prognosis for meaningful recovery given to family per MD note, she conts on vent support.                     Action/Plan: NCM will follow for dc needs.  Expected Discharge Date:                  Expected Discharge Plan:     In-House Referral:     Discharge planning Services  CM Consult  Post Acute Care Choice:    Choice offered to:     DME Arranged:    DME Agency:     HH Arranged:    HH Agency:     Status of Service:  In process, will continue to follow  If discussed at Long Length of Stay Meetings, dates discussed:    Additional Comments:  Samantha Dyer, Samantha Skog [place], RN 09/18/2017, 10:46 AM

## 2017-09-18 NOTE — Progress Notes (Signed)
Pharmacy Antibiotic Note  Samantha Dyer is a 67 y.o. female s/p PEA arrest with possible PNA  Pharmacy has been consulted for cefepime dosing (currently day 5). -WBC= 5.6, tmax= 100.6, CrCl ~ 55 -urine cx- enterococcus (20K), blood cx with micrococcus 1/2  Plan: -No cefepime dose changes needed -Will follow renal function, cultures and clinical progress   Height: 5\' 4"  (162.6 cm) Weight: 122 lb 5.7 oz (55.5 kg) IBW/kg (Calculated) : 54.7  Temp (24hrs), Avg:99.5 F (37.5 C), Min:98.4 F (36.9 C), Max:100.6 F (38.1 C)  Recent Labs  Lab 06/12/18 0723 06/12/18 0726 06/12/18 0727 06/12/18 0909 06/12/18 0921 06/12/18 1137 06/12/18 1505 09/15/17 0500 09/18/17 0211  WBC 7.9  --   --  11.8*  --   --   --  11.4* 5.6  CREATININE 0.61 0.60  --  0.88  --   --   --  1.15* 0.87  LATICACIDVEN  --   --  2.38*  --  2.14* 3.7* 1.3  --   --     Estimated Creatinine Clearance: 54.9 mL/min (by C-G formula based on SCr of 0.87 mg/dL).    Allergies  Allergen Reactions  . Amoxicillin-Pot Clavulanate Swelling  . Augmentin [Amoxicillin-Pot Clavulanate] Swelling    Antimicrobials this admission: 1/25 vanc>>1/26 1/25 cefepime>>  Dose adjustments this admission:   Microbiology results: 1/25 BCx: GPC- BCID neg = micrococcus 1/25: Ucx: 20 K enterococcus - pan sens 1/25 S. Pneumo: negative 1/25 MRSA PCR neg  Thank you for allowing pharmacy to be a part of this patient's care.  Samantha GermanAndrew Donnalynn Dyer, Pharm D 09/18/2017 9:02 AM

## 2017-09-18 NOTE — Significant Event (Signed)
Dr. Laurence SlateAroor called RN and gave orders to start patient on propofol drip and that RN would need to call CCM if patient's BP drop due to the propofol. Patient's SBP dropped to 90s, with MAPs in the 50s. RN called CCM and spoke with Dr. Isaiah SergeMannam who gave verbal orders to give bolus of 500cc NS and to repeat if needed for blood pressures support. RN have already decreased Cardizem drip to 2mg . Will implement these orders.    Samantha Dyer

## 2017-09-18 NOTE — Significant Event (Signed)
Dr. Isaiah SergeMannam gave RN a verbal order to administer to patient another dose of potassium chloride 40mEq, and for RN to enter this order in Epic.    Samantha Dyer

## 2017-09-18 NOTE — Progress Notes (Signed)
PULMONARY / CRITICAL CARE MEDICINE   Name: Samantha Dyer MRN: [id number] DOB: 1951/05/21    ADMISSION DATE:  08/25/2017   REFERRING MD: Emergency department physician  CHIEF COMPLAINT: PEA arrest  HISTORY OF PRESENT ILLNESS:   67 year old female with significant pulmonary history of Gold stage IV COPD with CABG 05/2017 & pleural effusion s/p  Thoracentesis 07/2017.  She is O2 dependent and was a DNR prior to this admission on hospice care.  While at home her grandson noted that she was confused and removed her oxygen.  Subsequently she went into a cardiac arrest he performed CPR EMS arrived he was given 1 dose of epi with return of spontaneous circulation.  She was intubated and transported to [facility].  Her DNR was reversed.  She is noted to have a hemoglobin of 6.  She is currently hemodynamically stable not on pressors.  Pulmonary critical care called to admit she will not be a hypothermia candidate. Note, receiving MSO4 for palliation. Still smokes.   STUDIES:    CULTURES: None  ANTIBIOTICS: None  LINES/TUBES: 09/04/2017 endotracheal tube>> 08/23/2017 left internal jugular CVL>> 09/04/2017 right radial arterial line pending>>   SIGNIFICANT EVENTS: 09/18/2017 PEA arrest  SUBJECTIVE/OVERNIGHT/INTERVAL HX Remains comatose. EEG suggesting this is all anoxic, but periodic discharges cannot rule out NCSE. Kappra started. Also developed AF RVR 1/28.  VITAL SIGNS: BP 131/69   Pulse 90   Temp 99.9 F (37.7 C) (Bladder)   Resp (!) 21   Ht 5\' 4"  (1.626 m)   Wt 55.5 kg (122 lb 5.7 oz)   SpO2 97%   BMI 21.00 kg/m   HEMODYNAMICS:    VENTILATOR SETTINGS: Vent Mode: PRVC FiO2 (%):  [40 %] 40 % Set Rate:  [12 bmp] 12 bmp Vt Set:  [400 mL] 400 mL PEEP:  [5 cmH20] 5 cmH20 Plateau Pressure:  [17 cmH20-29 cmH20] 29 cmH20  INTAKE / OUTPUT: I/O last 3 completed shifts: In: 3306.7 [I.V.:1956.9; NG/GT:539.8; IV Piggyback:810] Out: 1410 [Urine:1210; Emesis/NG  output:200]  PHYSICAL EXAMINATION: General:  Elderly female on vent Neuro:  Comatose, no sedatives.  HEENT:  [place]/AT, No JVD noted Cardiovascular:  RRR, no MRG Lungs:  Clear Abdomen:  Soft, non-distended Musculoskeletal:  No acute deformity Skin:  Intact, MMM   PULMONARY Recent Labs  Lab 09/18/2017 0726 09/16/2017 1330 09/15/17 0410  PHART  --  7.502* 7.560*  PCO2ART  --  71.4* 54.5*  PO2ART  --  419.0* 70.9*  HCO3  --  56.2* 48.8*  TCO2 31 >50*  --   O2SAT  --  100.0 95.5    CBC Recent Labs  Lab 08/29/2017 0909 09/15/17 0500 09/18/17 0211  HGB 8.5* 7.8* 7.2*  HCT 27.8* 24.7* 22.8*  WBC 11.8* 11.4* 5.6  PLT 163 114* 76*    COAGULATION Recent Labs  Lab 09/03/2017 0723  INR 1.49    CARDIAC  No results for input(s): TROPONINI in the last 168 hours. No results for input(s): PROBNP in the last 168 hours.   CHEMISTRY Recent Labs  Lab 09/15/2017 0723 09/03/2017 0726 09/01/2017 0909 09/15/17 0500 09/18/17 0211  NA 140 140  --  131* 140  K 3.1* 3.1*  --  4.4 2.8*  CL 101 93*  --  79* 94*  CO2 30  --   --  39* 35*  GLUCOSE 101* 99  --  115* 98  BUN 8 6  --  19 19  CREATININE 0.61 0.60 0.88 1.15* 0.87  CALCIUM 5.5*  --   --  8.5* 8.0*  MG  --   --   --   --  2.2  PHOS  --   --   --   --  3.6   Estimated Creatinine Clearance: 54.9 mL/min (by C-G formula based on SCr of 0.87 mg/dL).   LIVER Recent Labs  Lab 2018/01/14 0723  AST 22  ALT 17  ALKPHOS 47  BILITOT 0.5  PROT 3.0*  ALBUMIN 1.9*  INR 1.49     INFECTIOUS Recent Labs  Lab 2018/01/14 0909 2018/01/14 0921 2018/01/14 1137 2018/01/14 1505  LATICACIDVEN  --  2.14* 3.7* 1.3  PROCALCITON <0.10  --   --   --      ENDOCRINE CBG (last 3)  Recent Labs    09/17/17 2352 09/18/17 0359 09/18/17 0758  GLUCAP 91 121* 150*         IMAGING x48h  - image(s) personally visualized  -   highlighted in bold Dg Chest Port 1 View  Result Date: 09/17/2017 CLINICAL DATA:  67 year old female admitted status  post PEA arrest at home. EXAM: PORTABLE CHEST 1 VIEW COMPARISON:  05/15/2018 and earlier. FINDINGS: Portable AP semi upright view at 1602 hrs. Stable endotracheal tube tip between the clavicles and carina. Enteric tube in place with side hole up the level of the proximal gastric body. Left IJ central line has been removed. Stable cardiac size and mediastinal contours. Prior CABG. Pacer or resuscitation pads continue to project over the left lower chest. Clearing of the patchy left upper lung opacity since 05/15/2018. Continued confluent in veiling opacity at the right lung base, now in part more resembling pleural effusion. No pneumothorax. Stable pulmonary vascularity. Stable left lung base. IMPRESSION: 1. Left IJ central line removed.  Otherwise stable lines and tubes. 2. Clearing of the patchy left upper lung opacity but continued right lower lung opacity favored to represent a combination of pleural effusion and atelectasis or consolidation. Electronically Signed   By: Odessa FlemingH  Hall M.D.   On: 09/17/2017 16:37   Dg Abd Portable 1v  Result Date: 09/17/2017 CLINICAL DATA:  67 year old female admitted status post PEA arrest at home. Orogastric tube placement. EXAM: PORTABLE ABDOMEN - 1 VIEW COMPARISON:  Portable chest today reported separately. FINDINGS: Portable AP supine view at 1602 hrs. Enteric tube courses to the left abdomen with side hole at the level of the proximal gastric body. Bowel gas pattern is within normal limits. No acute osseous abnormality identified. Asymmetric opacity redemonstrated at the right lung base. IMPRESSION: 1. Enteric tube side hole at the level of the proximal gastric body. 2. Nonobstructed bowel-gas pattern. Electronically Signed   By: Odessa FlemingH  Hall M.D.   On: 09/17/2017 16:39     DISCUSSION: 67 year old female with CABG 05/2017  Gold stage IV COPD   She is O2 dependent and was a DNR prior to this admission.  Adm with PEA arrest. Her DNR was reversed.  Concern for anoxic damage. Poor  prognosis for meaningful recovery given to family   ASSESSMENT / PLAN:  NEUROLOGIC A:   Anoxic encephalopathy: anoxic vs metabolic. Also periodic discharges on EEG cannot rule out NCSE.   P:   RASS goal: 0 Keppra  Poor prognosis for meaningful recovery given to family 1/27 assuming mental status does not improve with seizure control. Will need to repeat EEG now that she is on AED. Will consult neurology.   PULMONARY A: Acute respiratory failure p- cardiac arrest with PEA COPD Gold stage IV without exacerbation. S/p Rt thoracentesis Resp  alkalosis on vent  P:   PRVC; no weaning/extubation Bronchodilators scheduled and PRN Repeat ABG to ensure no etiology for encephalopathy here. No need for steroids  CARDIOVASCULAR A:  PEA arrest: suspect from pulmonary cause. Rhythm reestablished with 1 dose of epinephrine and CPR Atrial fibrillation started 1/29 TEE on 09/06/2016 shows LV function is low normal EF 50%  P: Telemetry monitoring   Diltiazem infusion Will need anticoagulation 1/29 if AF persists  RENAL A:  Mild AKI  Hypokalemia  P:   Replete electrolytes as needed K replaced by Pola Corn this AM will repeat BMP in afternoon.   GASTROINTESTINAL A:    History of gastroesophageal reflux disease P:   Orogastric tube PPI Tube feeds  HEMATOLOGIC A:   Anemia P:  Monitor closely. Slow decrease in Hgb Will defer transfusion for now. Hold    INFECTIOUS A:   No overt infections P:   DC cefepime  ENDOCRINE A:   No acute issues P:   Monitor glucose   FAMILY  - Updates: son updated 1/29. Given new EEG findings they wish to wait another 24 hours before making a decision regarding GOC.  - Inter-disciplinary family meet or Palliative Care meeting due by:  1/27  Joneen Roach, AGACNP-BC [place] [facility] Pager [phone] or [phone]  09/18/2017 10:49 AM

## 2017-09-18 NOTE — Consult Note (Signed)
NEURO HOSPITALIST CONSULT NOTE   Requestig physician: Dr. Nelda Marseille   Reason for Consult: Prognostication   History obtained from:   Chart     HPI:                                                                                                                                          Samantha Dyer is an 67 y.o. female with significant pulmonary history including stage IV COPD with history of pneumothorax following thoracentesis.  Patient is O2 dependent and was a DNR prior to admission.  Apparently per chart while at home her grandson noted that she was confused and removed her on oxygen.  She subsequently went into cardiac arrest and he performed CPR until EMS arrived to which she was then given 1 dose of epi with ROSC.  Patient was at that point intubated.  As this appeared to be more pulmonary related she was not put on the cooling protocol.  This initial event occurred on 08/22/2017.  Today is 09/18/2017.  On 1/27 patient was found to be in atrial fibrillation.  On 1 4/28 patient remained comatose even off sedation and family was told that this is not a good prognosis.  Family was accepting the prognosis but wanted to give it a few more days.  EEG was performed on 14/28/2019--EEG showed markedly abnormal due to generalized periodic discharges with triphasic appearing waves that were waxing and waning throughout the recording.  The findings indicate severe diffuse cerebral dysfunction that is nonspecific but most commonly seen in the setting of anoxic/ischemic injury or toxic metabolic encephalopathy.  She was started on Keppra at that time  Labs of importance for prognostication: On arrival: Sodium 140 Potassium 3.1 BUN was 8 Creatinine was 0.61 Calcium 5.5  Today: Potassium 2.8 BUN 19 Creatinine 0.87 Calcium 8.0   CT of head was done on 1/26 with did not show any acute abnormalities  Past Medical History:  Diagnosis Date  . Allergic rhinitis, cause  unspecified   . Anemia, unspecified   . Backache, unspecified   . CAD (coronary artery disease)    a. cath on 06/01/17 showing severe 3-vessel CAD --> s/p CABG on 06/06/2017 with LIMA-LAD, SVG-PDA, and Seq SVG-RI1-RI2.  Marland Kitchen COPD (chronic obstructive pulmonary disease) ([place])   . Esophageal reflux   . Iron deficiency anemia, unspecified     Past Surgical History:  Procedure Laterality Date  . CORONARY ARTERY BYPASS GRAFT N/A 06/06/2017   Procedure: CORONARY ARTERY BYPASS GRAFTING x 4 USING LEFT INTERNAL MAMMARY ARTERY AND RIGHT GREATER SAPHENOUS VEIN HARVESTED ENDOSCOPICALLY -LIMA to LAD -SVG to PDA -SEQ SVG to RAMUS 1 and RAMUS 2;  Surgeon: Melrose Nakayama, MD;  Location: [place];  Service: Open Heart Surgery;  Laterality: N/A;  .  RIGHT/LEFT HEART CATH AND CORONARY ANGIOGRAPHY N/A 06/01/2017   Procedure: RIGHT/LEFT HEART CATH AND CORONARY ANGIOGRAPHY;  Surgeon: Jettie Booze, MD;  Location: [place] [facility];  Service: Cardiovascular;  Laterality: N/A;  . TEE WITHOUT CARDIOVERSION N/A 06/06/2017   Procedure: TRANSESOPHAGEAL ECHOCARDIOGRAM (TEE);  Surgeon: Melrose Nakayama, MD;  Location: [place];  Service: Open Heart Surgery;  Laterality: N/A;    Family History  Problem Relation Age of Onset  . Emphysema Mother   . Heart disease Mother   . Breast cancer Mother   . Pancreatic cancer Father       Social History:  reports that she has been smoking cigarettes.  She has a 43.00 pack-year smoking history. she has never used smokeless tobacco. She reports that she does not drink alcohol or use drugs.  Allergies  Allergen Reactions  . Amoxicillin-Pot Clavulanate Swelling  . Augmentin [Amoxicillin-Pot Clavulanate] Swelling    MEDICATIONS:                                                                                                                     Scheduled: . chlorhexidine gluconate (MEDLINE KIT)  15 mL Mouth Rinse BID  . feeding supplement (VITAL HIGH PROTEIN)   1,000 mL Per Tube Q24H  . ipratropium-albuterol  3 mL Nebulization Q6H  . mouth rinse  15 mL Mouth Rinse 10 times per day  . pantoprazole (PROTONIX) IV  40 mg Intravenous QHS   Continuous: . sodium chloride    . sodium chloride    . sodium chloride 50 mL/hr at 09/18/17 0700  . diltiazem (CARDIZEM) infusion 10 mg/hr (09/18/17 0829)  . levETIRAcetam Stopped (09/18/17 0115)   EML:JQGBEE chloride, acetaminophen (TYLENOL) oral liquid 160 mg/5 mL, fentaNYL (SUBLIMAZE) injection, fentaNYL (SUBLIMAZE) injection   ROS:                                                                                                                                       History obtained from unobtainable from patient due to mental status    Blood pressure 131/69, pulse 90, temperature 99.9 F (37.7 C), temperature source Bladder, resp. rate (!) 21, height '5\' 4"'$  (1.626 m), weight 55.5 kg (122 lb 5.7 oz), SpO2 97 %.   General Examination:  Physical Exam  HEENT-  Normocephalic, no lesions, without obvious abnormality.  Normal external eye and conjunctiva.   Cardiovascular- S1-S2 audible, pulses palpable throughout   Lungs-no rhonchi or wheezing noted, no excessive working breathing.  Saturations within normal limits Abdomen- All 4 quadrants palpated and nontender Extremities- Warm, dry and intact Musculoskeletal-no joint tenderness, deformity or swelling Skin-warm and dry, no hyperpigmentation, vitiligo, or suspicious lesions  Neurological Examination Mental Status: Patient does not respond to verbal stimuli.  Does not respond to deep sternal rub.  Does not follow commands.  No verbalizations noted. intubated Cranial Nerves: II: patient does not respond confrontation bilaterally, pupils right 2 mm, left 2 mm,and sluggishly reactive bilaterally III,IV,VI: doll's response present bilaterally.  V,VII: corneal  reflex present bilaterally  VIII: patient does not respond to verbal stimuli IX,X: no gag, XI: trapezius strength unable to test bilaterally XII: tongue strength unable to test Motor: Extremities flaccid throughout.  No spontaneous movement noted.  No purposeful movements noted. Sensory: Does not respond to noxious stimuli in any extremity. Deep Tendon Reflexes:  Depressed throughout Plantars: Right is down left is equivocal Cerebellar: Unable to perform  With mouth care patient shows possible decerebrate posturing in the upper extremities   Lab Results: Basic Metabolic Panel: Recent Labs  Lab 08/28/2017 0723 09/08/2017 0726 09/05/2017 0909 09/15/17 0500 09/18/17 0211  NA 140 140  --  131* 140  K 3.1* 3.1*  --  4.4 2.8*  CL 101 93*  --  79* 94*  CO2 30  --   --  39* 35*  GLUCOSE 101* 99  --  115* 98  BUN 8 6  --  19 19  CREATININE 0.61 0.60 0.88 1.15* 0.87  CALCIUM 5.5*  --   --  8.5* 8.0*  MG  --   --   --   --  2.2  PHOS  --   --   --   --  3.6    CBC: Recent Labs  Lab 08/22/2017 0723 08/29/2017 0726 09/06/2017 0909 09/15/17 0500 09/18/17 0211  WBC 7.9  --  11.8* 11.4* 5.6  NEUTROABS 6.7  --   --   --  5.0  HGB 6.0* 5.4* 8.5* 7.8* 7.2*  HCT 19.3* 16.0* 27.8* 24.7* 22.8*  MCV 96.5  --  95.5 90.8 93.8  PLT 113*  --  163 114* 76*    Cardiac Enzymes: No results for input(s): CKTOTAL, CKMB, CKMBINDEX, TROPONINI in the last 168 hours.  Lipid Panel: No results for input(s): CHOL, TRIG, HDL, CHOLHDL, VLDL, LDLCALC in the last 168 hours.  Imaging: Dg Chest Port 1 View  Result Date: 09/17/2017 CLINICAL DATA:  67 year old female admitted status post PEA arrest at home. EXAM: PORTABLE CHEST 1 VIEW COMPARISON:  09/01/2017 and earlier. FINDINGS: Portable AP semi upright view at 1602 hrs. Stable endotracheal tube tip between the clavicles and carina. Enteric tube in place with side hole up the level of the proximal gastric body. Left IJ central line has been removed. Stable  cardiac size and mediastinal contours. Prior CABG. Pacer or resuscitation pads continue to project over the left lower chest. Clearing of the patchy left upper lung opacity since 09/20/2017. Continued confluent in veiling opacity at the right lung base, now in part more resembling pleural effusion. No pneumothorax. Stable pulmonary vascularity. Stable left lung base. IMPRESSION: 1. Left IJ central line removed.  Otherwise stable lines and tubes. 2. Clearing of the patchy left upper lung opacity but continued right lower lung opacity favored  to represent a combination of pleural effusion and atelectasis or consolidation. Electronically Signed   By: Genevie Ann M.D.   On: 09/17/2017 16:37   Dg Abd Portable 1v  Result Date: 09/17/2017 CLINICAL DATA:  67 year old female admitted status post PEA arrest at home. Orogastric tube placement. EXAM: PORTABLE ABDOMEN - 1 VIEW COMPARISON:  Portable chest today reported separately. FINDINGS: Portable AP supine view at 1602 hrs. Enteric tube courses to the left abdomen with side hole at the level of the proximal gastric body. Bowel gas pattern is within normal limits. No acute osseous abnormality identified. Asymmetric opacity redemonstrated at the right lung base. IMPRESSION: 1. Enteric tube side hole at the level of the proximal gastric body. 2. Nonobstructed bowel-gas pattern. Electronically Signed   By: Genevie Ann M.D.   On: 09/17/2017 16:39    Assessment and plan per attending neurologist  Etta Quill PA-C [facility] [phone]  09/18/2017, 11:02 AM   NEUROHOSPITALIST ADDENDUM Seen and examined the patient today. Formulated plan as documented above by PAC/Resident.    Assessment/Plan: This is a 67 year old female with significant pulmonary history including stage IV COPD.  Patient is on home oxygen apparently was found without oxygen for an unknown period of time.  She then went into PEA arrest with unknown CPR time however EMS was called  immediately and then given 1 dose of epi with return of spontaneous circulation.  Physical exam at this time shows intact brainstem function with the posturing.  EEG performed  showed waxing and waning generalized periodic discharges that completely disappear for 40-55 seconds, thus nonconvulsive status epilepticus a possibility.   Suspected Anoxic injury  Possible NCSE- EEG pattern in ictal - interictal spectrum   Plan Stat Video EEG ordered Loaded with Valproic Acid and started on maintenance Dosing Increased Keppra 1g BID Started patient on propofol - will titrate Continue supportive treatment per primary   Sushanth Aroor MD [facility] [phone]  If 7pm to 7am, please call on call as listed on AMION.

## 2017-09-19 DIAGNOSIS — R569 Unspecified convulsions: Secondary | ICD-10-CM

## 2017-09-19 DIAGNOSIS — E43 Unspecified severe protein-calorie malnutrition: Secondary | ICD-10-CM

## 2017-09-19 LAB — BASIC METABOLIC PANEL
Anion gap: 9 (ref 5–15)
BUN: 33 mg/dL — AB (ref 6–20)
CHLORIDE: 99 mmol/L — AB (ref 101–111)
CO2: 33 mmol/L — ABNORMAL HIGH (ref 22–32)
Calcium: 7.6 mg/dL — ABNORMAL LOW (ref 8.9–10.3)
Creatinine, Ser: 0.88 mg/dL (ref 0.44–1.00)
GFR calc Af Amer: >60 mL/min (ref >60)
GLUCOSE: 112 mg/dL — AB (ref 65–99)
POTASSIUM: 4.8 mmol/L (ref 3.5–5.1)
Sodium: 141 mmol/L (ref 135–145)

## 2017-09-19 LAB — GLUCOSE, CAPILLARY
GLUCOSE-CAPILLARY: 112 mg/dL — AB (ref 65–99)
GLUCOSE-CAPILLARY: 104 mg/dL — AB (ref 65–99)
GLUCOSE-CAPILLARY: 105 mg/dL — AB (ref 65–99)
Glucose-Capillary: 101 mg/dL — ABNORMAL HIGH (ref 65–99)
Glucose-Capillary: 93 mg/dL (ref 65–99)
Glucose-Capillary: 97 mg/dL (ref 65–99)

## 2017-09-19 LAB — CBC WITH DIFFERENTIAL/PLATELET
BASOS PCT: 0 %
Basophils Absolute: 0 10*3/uL (ref 0.0–0.1)
EOS ABS: 0.1 10*3/uL (ref 0.0–0.7)
Eosinophils Relative: 2 %
HEMATOCRIT: 21.2 % — AB (ref 36.0–46.0)
Hemoglobin: 6.5 g/dL — CL (ref 12.0–15.0)
Lymphocytes Relative: 7 %
Lymphs Abs: 0.4 10*3/uL — ABNORMAL LOW (ref 0.7–4.0)
MCH: 29.3 pg (ref 26.0–34.0)
MCHC: 30.7 g/dL (ref 30.0–36.0)
MCV: 95.5 fL (ref 78.0–100.0)
MONO ABS: 0.3 10*3/uL (ref 0.1–1.0)
MONOS PCT: 6 %
NEUTROS ABS: 4.6 10*3/uL (ref 1.7–7.7)
Neutrophils Relative %: 85 %
Platelets: 69 10*3/uL — ABNORMAL LOW (ref 150–400)
RBC: 2.22 MIL/uL — ABNORMAL LOW (ref 3.87–5.11)
RDW: 16.5 % — AB (ref 11.5–15.5)
WBC: 5.4 10*3/uL (ref 4.0–10.5)

## 2017-09-19 LAB — VALPROIC ACID LEVEL: Valproic Acid Lvl: 63 ug/mL (ref 50.0–100.0)

## 2017-09-19 LAB — CULTURE, BLOOD (ROUTINE X 2)
CULTURE: NO GROWTH
CULTURE: NO GROWTH
Culture: NO GROWTH
SPECIAL REQUESTS: ADEQUATE
Special Requests: ADEQUATE
Special Requests: ADEQUATE

## 2017-09-19 LAB — PHOSPHORUS: PHOSPHORUS: 3 mg/dL (ref 2.5–4.6)

## 2017-09-19 LAB — MAGNESIUM: Magnesium: 2.1 mg/dL (ref 1.7–2.4)

## 2017-09-19 LAB — TRIGLYCERIDES: Triglycerides: 47 mg/dL (ref <150)

## 2017-09-19 MED ORDER — METOPROLOL TARTRATE 25 MG PO TABS
25.0000 mg | ORAL_TABLET | Freq: Two times a day (BID) | ORAL | Status: DC
  Administered 2017-09-19: 25 mg via ORAL
  Filled 2017-09-19: qty 1

## 2017-09-19 MED ORDER — SODIUM CHLORIDE 0.9 % IV SOLN
10.0000 mg/h | INTRAVENOUS | Status: DC
  Administered 2017-09-19: 10 mg/h via INTRAVENOUS
  Administered 2017-09-19: 12.5 mg/h via INTRAVENOUS
  Filled 2017-09-19: qty 10

## 2017-09-19 MED ORDER — METOPROLOL TARTRATE 5 MG/5ML IV SOLN: 2.5000 mg | INTRAVENOUS | Status: DC | PRN

## 2017-09-21 NOTE — Progress Notes (Signed)
Withdrawal of life support per order. Family at bedside.

## 2017-09-21 NOTE — Progress Notes (Signed)
Offered to family to call chaplain, family declined at this time. Will continue to monitor.  Herma ArdMOSELEY, Angello Chien F, RN

## 2017-09-21 NOTE — Progress Notes (Signed)
PULMONARY / CRITICAL CARE MEDICINE   Name: Samantha Dyer MRN: [id number] DOB: 10/17/1950    ADMISSION DATE:  06-29-2018   REFERRING MD: Emergency department physician  CHIEF COMPLAINT: PEA arrest  HISTORY OF PRESENT ILLNESS:   67 year old female with significant pulmonary history of Gold stage IV COPD with CABG 05/2017 & pleural effusion s/p  Thoracentesis 07/2017.  She is O2 dependent and was a DNR prior to this admission on hospice care.  While at home her grandson noted that she was confused and removed her oxygen.  Subsequently she went into a cardiac arrest he performed CPR EMS arrived he was given 1 dose of epi with return of spontaneous circulation.  [facility] was intubated and [facility].  Her DNR was reversed.  She is noted to have a hemoglobin of 6.  She is currently hemodynamically stable not on pressors.  Pulmonary critical care called to admit she will not be a hypothermia candidate. Note, receiving MSO4 for palliation. Still smokes.   STUDIES:    CULTURES: None  ANTIBIOTICS: None  LINES/TUBES: 06-29-2018 endotracheal tube>> 06-29-2018 left internal jugular CVL>> 06-29-2018 right radial arterial line>>   SIGNIFICANT EVENTS: 06-29-2018 PEA arrest 1/28 EEG >>waxing and waning generalized periodic discharges that completely disappear for 40-50 seconds ? NCSE >> propofol for burst suppression    SUBJECTIVE/OVERNIGHT/INTERVAL HX  Remains comatose on propofol drip Low-grade fevers  VITAL SIGNS: BP (!) 125/57   Pulse 96   Temp 99.1 F (37.3 C)   Resp 14   Ht 5\' 4"  (1.626 m)   Wt 129 lb 6.6 oz (58.7 kg)   SpO2 99%   BMI 22.21 kg/m   HEMODYNAMICS:    VENTILATOR SETTINGS: Vent Mode: PRVC FiO2 (%):  [40 %] 40 % Set Rate:  [12 bmp] 12 bmp Vt Set:  [400 mL] 400 mL PEEP:  [5 cmH20] 5 cmH20 Plateau Pressure:  [16 cmH20-23 cmH20] 16 cmH20  INTAKE / OUTPUT: I/O last 3 completed shifts: In: 4511.8 [I.V.:2256.8; NG/GT:1830; IV  Piggyback:425] Out: 1095 [Urine:1095]  PHYSICAL EXAMINATION: General:  Elderly female on vent Neuro:  Comatose, on propofol.  HEENT:  [place]/AT, No JVD noted Cardiovascular:  RRR, no MRG Lungs: Decreased breath sounds bilateral, no accessory muscle use Abdomen:  Soft, non-distended Musculoskeletal:  No acute deformity Skin:  Intact, MMM   PULMONARY Recent Labs  Lab 03-07-18 0726 03-07-18 1330 09/15/17 0410  PHART  --  7.502* 7.560*  PCO2ART  --  71.4* 54.5*  PO2ART  --  419.0* 70.9*  HCO3  --  56.2* 48.8*  TCO2 31 >50*  --   O2SAT  --  100.0 95.5    CBC Recent Labs  Lab 09/15/17 0500 09/18/17 0211 09/09/2017 0240  HGB 7.8* 7.2* 6.5*  HCT 24.7* 22.8* 21.2*  WBC 11.4* 5.6 5.4  PLT 114* 76* 69*    COAGULATION Recent Labs  Lab 03-07-18 0723  INR 1.49    CARDIAC  No results for input(s): TROPONINI in the last 168 hours. No results for input(s): PROBNP in the last 168 hours.   CHEMISTRY Recent Labs  Lab 03-07-18 0723 03-07-18 0726 03-07-18 0909 09/15/17 0500 09/18/17 0211 09/18/17 1528 09/09/2017 0240  NA 140 140  --  131* 140 140 141  K 3.1* 3.1*  --  4.4 2.8* 5.4* 4.8  CL 101 93*  --  79* 94* 98* 99*  CO2 30  --   --  39* 35* 34* 33*  GLUCOSE 101* 99  --  115* 98 143* 112*  BUN 8 6  --  19 19 28* 33*  CREATININE 0.61 0.60 0.88 1.15* 0.87 0.80 0.88  CALCIUM 5.5*  --   --  8.5* 8.0* 7.8* 7.6*  MG  --   --   --   --  2.2  --  2.1  PHOS  --   --   --   --  3.6  --  3.0   Estimated Creatinine Clearance: 54.3 mL/min (by C-G formula based on SCr of 0.88 mg/dL).   LIVER Recent Labs  Lab September 27, 2017 0723  AST 22  ALT 17  ALKPHOS 47  BILITOT 0.5  PROT 3.0*  ALBUMIN 1.9*  INR 1.49     INFECTIOUS Recent Labs  Lab 27-Sep-2017 0909 09/27/17 0921 2017/09/27 1137 2017-09-27 1505  LATICACIDVEN  --  2.14* 3.7* 1.3  PROCALCITON <0.10  --   --   --      ENDOCRINE CBG (last 3)  Recent Labs    09/18/17 2357 09/13/2017 0352 08/22/2017 0747  GLUCAP 97 112*  104*         IMAGING x48h  - image(s) personally visualized  -   highlighted in bold Dg Chest Port 1 View  Result Date: 09/17/2017 CLINICAL DATA:  67 year old female admitted status post PEA arrest at home. EXAM: PORTABLE CHEST 1 VIEW COMPARISON:  09/27/2017 and earlier. FINDINGS: Portable AP semi upright view at 1602 hrs. Stable endotracheal tube tip between the clavicles and carina. Enteric tube in place with side hole up the level of the proximal gastric body. Left IJ central line has been removed. Stable cardiac size and mediastinal contours. Prior CABG. Pacer or resuscitation pads continue to project over the left lower chest. Clearing of the patchy left upper lung opacity since Sep 27, 2017. Continued confluent in veiling opacity at the right lung base, now in part more resembling pleural effusion. No pneumothorax. Stable pulmonary vascularity. Stable left lung base. IMPRESSION: 1. Left IJ central line removed.  Otherwise stable lines and tubes. 2. Clearing of the patchy left upper lung opacity but continued right lower lung opacity favored to represent a combination of pleural effusion and atelectasis or consolidation. Electronically Signed   By: Odessa Fleming M.D.   On: 09/17/2017 16:37   Dg Abd Portable 1v  Result Date: 09/17/2017 CLINICAL DATA:  67 year old female admitted status post PEA arrest at home. Orogastric tube placement. EXAM: PORTABLE ABDOMEN - 1 VIEW COMPARISON:  Portable chest today reported separately. FINDINGS: Portable AP supine view at 1602 hrs. Enteric tube courses to the left abdomen with side hole at the level of the proximal gastric body. Bowel gas pattern is within normal limits. No acute osseous abnormality identified. Asymmetric opacity redemonstrated at the right lung base. IMPRESSION: 1. Enteric tube side hole at the level of the proximal gastric body. 2. Nonobstructed bowel-gas pattern. Electronically Signed   By: Odessa Fleming M.D.   On: 09/17/2017 16:39      DISCUSSION: 67 year old female with CABG 05/2017  Gold stage IV COPD   She is O2 dependent and was a DNR prior to this admission.  Adm with PEA arrest. Concern for anoxic damage. Poor prognosis for meaningful recovery given to family   ASSESSMENT / PLAN:  NEUROLOGIC A:   Anoxic encephalopathy: Concern for nonconvulsive status likely related to anoxia  P:   RASS goal: 0 Keppra , propofol per neurology Poor prognosis for meaningful recovery given to family   PULMONARY A: Acute respiratory failure p- cardiac arrest  with PEA COPD Gold stage IV without exacerbation. S/p Rt thoracentesis 07/2017  P:   PRVC; no weaning/extubation Bronchodilators scheduled and PRN    CARDIOVASCULAR A:  PEA arrest: suspect from pulmonary cause. Rhythm reestablished with 1 dose of epinephrine and CPR Atrial fibrillation started 1/29 TEE on 09/06/2016 shows LV function is low normal EF 50%  P: Telemetry monitoring   Dc Diltiazem infusion and resume home dose of metoprolol    RENAL A:  Mild AKI  Hypokalemia  P:   Replete electrolytes as needed   GASTROINTESTINAL A:    History of gastroesophageal reflux disease P:   Orogastric tube PPI Tube feeds  HEMATOLOGIC A:   Anemia P:  Monitor closely. Slow decrease in Hgb Will defer transfusion for now. Hold    INFECTIOUS A:   No overt infections P:   DC cefepime     FAMILY  - Updates: Family updated 1/30, poor prognosis given, waiting on timing of withdrawal of life support  - Inter-disciplinary family meet or Palliative Care meeting due by:  Done 1/27  Cyril Mourning MD. FCCP. [facility] Pager [phone] If no response call [phone]     02-Oct-2017 10:31 AM

## 2017-09-21 NOTE — Progress Notes (Signed)
vLTM EEG maint complete. No skin breakdown. Continue to monitor °

## 2017-09-21 NOTE — Progress Notes (Signed)
Spoke with pt family, they have decided they are ready to begin the withdrawal process. eLink notified. Will continue to monitor.  Herma ArdMOSELEY, Gionni Vaca F, RN

## 2017-09-21 NOTE — Progress Notes (Signed)
Reason for consult:   Subjective: patient had no new clinical events    ROS: Unable to obtain due to poor mental status  Examination  Vital signs in last 24 hours: Temp:  [98.4 F (36.9 C)-100.4 F (38 C)] 98.4 F (36.9 C) (01/30 1800) Pulse Rate:  [44-109] 86 (01/30 1800) Resp:  [13-18] 16 (01/30 1800) BP: (96-125)/(40-57) 116/50 (01/30 1700) SpO2:  [97 %-100 %] 100 % (01/30 1800) FiO2 (%):  [40 %] 40 % (01/30 1520) Weight:  [58.7 kg (129 lb 6.6 oz)] 58.7 kg (129 lb 6.6 oz) (01/30 0200)  General: Not in distress, cooperative CVS: pulse-normal rate and rhythm RS: breathing comfortably Extremities: normal   Neuro: exam is when patient intubated and on sedation  Mental Status: Patient does not respond to verbal stimuli.  Does not respond to deep sternal rub.  Does not follow commands.  No verbalizations noted.  Cranial Nerves: II: patient does not respond confrontation bilaterally, pupils right 4 mm, left 4mm sluggish  III,IV,VI: doll's response absent bilaterally.V,VII: corneal reflex: not performed  VIII: patient does not respond to verbal stimuli IX,X: gag reflex absent XI: trapezius strength unable to test bilaterally XII: tongue strength unable to test Motor: Extremities flaccid throughout.  No spontaneous movement noted.  No purposeful movements noted. Sensory: Does not respond to noxious stimuli in any extremity. Deep Tendon Reflexes:  Absent throughout. Plantars: mute  Cerebellar: Unable to perform  Basic Metabolic Panel: Recent Labs  Lab 08/28/2017 0723 09/04/2017 0726 08/22/2017 0909 09/15/17 0500 09/18/17 0211 09/18/17 1528 10/17/2017 0240  NA 140 140  --  131* 140 140 141  K 3.1* 3.1*  --  4.4 2.8* 5.4* 4.8  CL 101 93*  --  79* 94* 98* 99*  CO2 30  --   --  39* 35* 34* 33*  GLUCOSE 101* 99  --  115* 98 143* 112*  BUN 8 6  --  19 19 28* 33*  CREATININE 0.61 0.60 0.88 1.15* 0.87 0.80 0.88  CALCIUM 5.5*  --   --  8.5* 8.0* 7.8* 7.6*  MG  --   --   --    --  2.2  --  2.1  PHOS  --   --   --   --  3.6  --  3.0    CBC: Recent Labs  Lab 08/25/2017 0723 09/13/2017 0726 09/03/2017 0909 09/15/17 0500 09/18/17 0211 10/17/2017 0240  WBC 7.9  --  11.8* 11.4* 5.6 5.4  NEUTROABS 6.7  --   --   --  5.0 4.6  HGB 6.0* 5.4* 8.5* 7.8* 7.2* 6.5*  HCT 19.3* 16.0* 27.8* 24.7* 22.8* 21.2*  MCV 96.5  --  95.5 90.8 93.8 95.5  PLT 113*  --  163 114* 76* 69*     Coagulation Studies: No results for input(s): LABPROT, INR in the last 72 hours.  Imaging Reviewed:     ASSESSMENT AND PLAN  This is a 67 year old female with significant pulmonary history including stage IV COPD.  Patient is on home oxygen apparently was found without oxygen for an unknown period of time.  She then went into PEA arrest with unknown CPR time however EMS was called immediately and then given 1 dose of epi with return of spontaneous circulation.  Physical exam at this time shows intact brainstem function with the posturing.  EEG performed  showed waxing and waning generalized periodic discharges that completely disappear for 40-55 seconds, thus nonconvulsive status epilepticus a possibility.   Suspected  Anoxic injury s/p cardiac arrest Possible NCSE- EEG initially showing generalized period discharges- now in burst suppression    Plan Continue LTm EEG  Patient started on propofol, titrated to burst suprression Continue valproic acid and Keppra Continue supportive treatment per primary Agree with palliative consult, early to prognosticate neurological recovery although I suspect it be poor,  however patient has end stage COPD and multiple medical issues and pursuing aggressive treatment may not be in patient's wished upon conversation with family.    Georgiana Spinner Willer [place] [facility] Pager Number [phone] For questions after 7pm please refer to AMION to reach the Neurologist on call

## 2017-09-21 NOTE — Progress Notes (Signed)
Pt showing asystole on monitor, this RN and Lezlie LyeLisa Frei, RN auscultated pt for 1 minute, no heart or breath sounds heard. Pt pronounced @ 2151. Family at bedside. Emotional support offered to family. Pt not candidate for organ donation. [facility] notified of pt time of death, spoke with HydrologistVicky Berrier.  225 cc morphine wasted down sink, witnessed by Irwin BrakemanMaddie Himes, RN.  Pt transported to morgue.  Herma ArdMOSELEY, Demere Dotzler F, RN

## 2017-09-21 NOTE — Procedures (Signed)
Electroencephalogram report- LTM  Ordering Provider: Buren KosSmith, David PA-C    Beginning date or time: 09/18/2016 14:32 hours Ending date or time: 09/15/2017  07:30 hours  Day of study: 1  Medications include: Per EMR  MENTAL STATUS (per technician's notes): Intubated, Sedated   HISTORY: This 24 hours of intensive EEG monitoring with simultaneous video monitoring was performed for this patient with altered mental status after cardiac arrest. This EEG was requested to rule out subclinical electrographic seizures.  TECHNICAL DESCRIPTION:  The study consists of a continuous 16-channel multi-montage digital video EEG recording with twenty-one electrodes placed according to the International 10-20 System. Additional leads included eye leads, true temporal leads (T1, T2), and an EKG lead. Activation procedures were not done due to mental status.  REPORT: The EEG at the beginning of the recording consists of generalized periodic discharges, 1-1.5 Hz in frequency.  No clear reactivity was seen with the discharges.  Over the duration of the recording, around 17:40 hours, there is resolution of periodic discharges, giving rise to more delta, theta activity with intermixed suppression.  The suppression increases over the duration of the recording resulting in burst suppression pattern, with suppression of 2-4 seconds in between the bursts.  IMPRESSION: This is an abnormal EEG due to: Generalized periodic discharges Burst suppression pattern  CLINICAL CORRELATION: This EEG is consistent with non-specific severe diffuse cerebral dysfunction.  The generalized periodic discharges, and burst suppression pattern if seen spontaneously after cardiac arrest, is usually associated with poor prognosis.  Clinical correlation is required.

## 2017-09-21 NOTE — Progress Notes (Signed)
Spoke with Derrick with [facility] over the phone. Derrick spoke with the spouse, Lanelle BalJerry Swaggerty over the phone regarding potential organ donation - per Ladene ArtistDerrick, the family does not have time to wait for a rep to come and see the patient - they would like to go ahead and proceed with terminal extubation. RT made aware. Will continue to monitor pt.  Herma ArdMOSELEY, Tyrice Hewitt F, RN

## 2017-09-21 NOTE — Progress Notes (Signed)
eLink Physician-Brief Progress Note Patient Name: Samantha KeasKathy D Corea DOB: [birth year] MRN: [id number]   Date of Service  09/12/2017  HPI/Events of Note  Anemia - Hgb = 6.5. Patient is hemodynamically stable. Note that family is contemplating comfort measures.   eICU Interventions  Will defer decision to transfuse or not to team rounding in AM.     Intervention Category Intermediate Interventions: Diagnostic test evaluation  India Jolin Eugene 09/08/2017, 3:35 AM

## 2017-09-21 NOTE — Progress Notes (Signed)
CRITICAL VALUE ALERT  Critical Value:  Hemoglobin 6.5  Date & Time Notied:  2018/05/30 @ 0308  Provider Notified: eLink notified  Orders Received/Actions taken: None at this time, per eLink, to discuss with rounding physician in AM.

## 2017-09-21 NOTE — Progress Notes (Signed)
EEG reviewed. In burst suppression pattern. Patient sedated with no clinical seizure activity noted at the bedside. Propofol at rate of 50. Continue with current management including propofol gtt at current rate.   Electronically signed: Dr. Caryl PinaEric Leam Madero

## 2017-09-21 NOTE — Progress Notes (Signed)
eLink Physician-Brief Progress Note Patient Name: Lum KeasKathy D Gettis DOB: 11-Apr-1951 MRN: [id number]   Date of Service  May 19, 2018  HPI/Events of Note  Discussion by phone with both patient's husband, Lanelle BalJerry Grizzle and son Dallas BreedingDaniel Gelber.  After family discussion with Dr. Vassie [name] and Neurologist they have made the decision to withdraw life-sustaining treatment.   I verified that their understanding of this is the ETT will be removed, the patient will be made a full DNR and will be placed on morphine gtt to titrate to patient comfort.  eICU Interventions  Orders for withdrawal of life-sustaining treatment placed in chart     Intervention Category Major Interventions: End of life / care limitation discussion  DETERDING,ELIZABETH May 19, 2018, 7:57 PM

## 2017-09-21 DEATH — deceased

## 2017-09-24 ENCOUNTER — Telehealth: Payer: Self-pay

## 2017-09-24 NOTE — Telephone Encounter (Signed)
On 09/24/17 I received a d/c from [facility] (original). The d/c is for burial. The patient is a patient of Doctor Mannam. [facility].  On 09/26/17 I received the d/c back from Doctor Mannam. I got the d/c ready and called the funeral home to let them know the d/c was mailed to vital records per the funeral home request.

## 2017-10-15 ENCOUNTER — Other Ambulatory Visit: Payer: Self-pay | Admitting: Physician Assistant

## 2017-10-15 ENCOUNTER — Ambulatory Visit: Payer: Medicare Other | Admitting: Pulmonary Disease

## 2017-10-19 NOTE — Discharge Summary (Signed)
PULMONARY / CRITICAL CARE MEDICINE   Name: Samantha Dyer MRN: [id number] DOB: [birth year]    ADMISSION DATE:  08/29/2017   HISTORY OF PRESENT ILLNESS:   67 year old female with significant pulmonary history of Gold stage IV COPD with CABG 05/2017 & pleural effusion s/p  Thoracentesis 07/2017.  She is O2 dependent and was a DNR prior to this admission on hospice care.  While at home her grandson noted that she was confused and removed her oxygen.  Subsequently she went into a cardiac arrest he performed CPR EMS arrived he was given 1 dose of epi with return of spontaneous circulation.  [facility]  Her DNR was reversed.       LINES/TUBES: 09/06/2017 endotracheal tube>> 08/23/2017 left internal jugular CVL>> 09/04/2017 right radial arterial line>>   SIGNIFICANT EVENTS: 09/20/2017 PEA arrest 1/28 EEG >>waxing and waning generalized periodic discharges that completely disappear for 40-50 seconds ? NCSE >> propofol for burst suppression     COURSE: 67 year old female with CABG 05/2017  Gold stage IV COPD   She is O2 dependent and was a DNR prior to this admission.  Adm with PEA arrest. Concern for anoxic damage. Poor prognosis for meaningful recovery given to family . Vent was withdrawn on 1/30 & she passed away soon after.  Cause of death : Acute respiratory failure, COPD,CAD.   Cyril Mourningakesh Coen Miyasato MD. Tonny BollmanFCCP. [facility] Pager [phone] If no response call [phone]     09/26/2017 10:02 PM

## 2017-12-03 ENCOUNTER — Other Ambulatory Visit: Payer: Self-pay | Admitting: Student

## 2018-04-18 IMAGING — CT CT CHEST W/O CM
2 of 4 series · 15 of 36 positions shown, 18 images · non-contrast
Comparison: None.

CLINICAL DATA: Status post thoracentesis with pressure dependent
pleuroparenchymal fistula. No pneumothorax.

EXAM:
CT CHEST WITHOUT CONTRAST
TECHNIQUE: Multidetector CT imaging of the chest was performed following the
standard protocol without IV contrast.

[Series 3: chest w/o 2mm st · axial · non-contrast · 0.64mm/px · z∈[+1247,+1519]mm · 12 of 160 slices shown, 15 images]
[im 12/160  mediastinal]
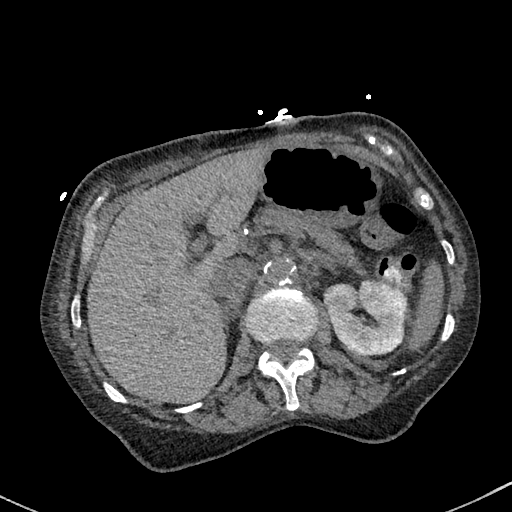
[im 12/160  lung]
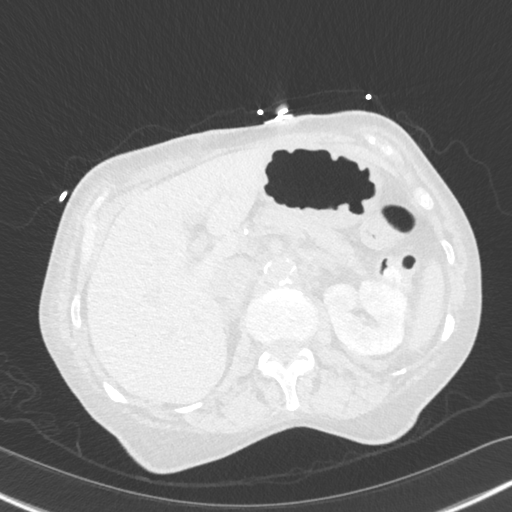
[im 23/160  lung]
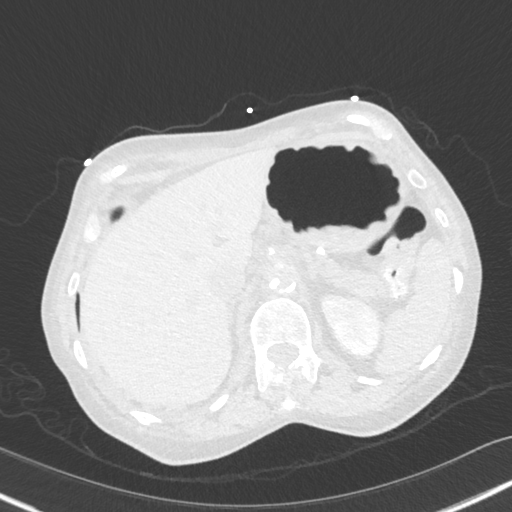
[im 35/160  lung]
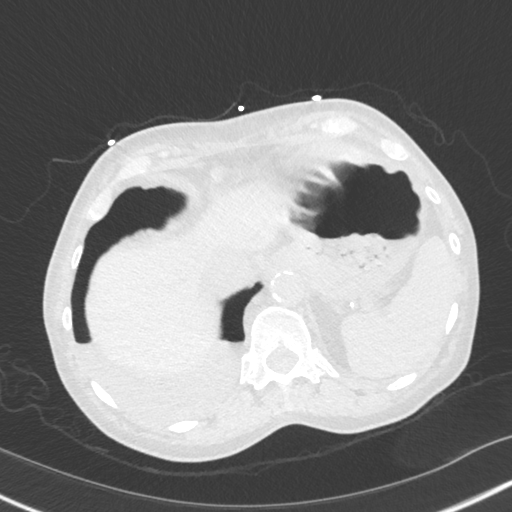
[im 46/160  lung]
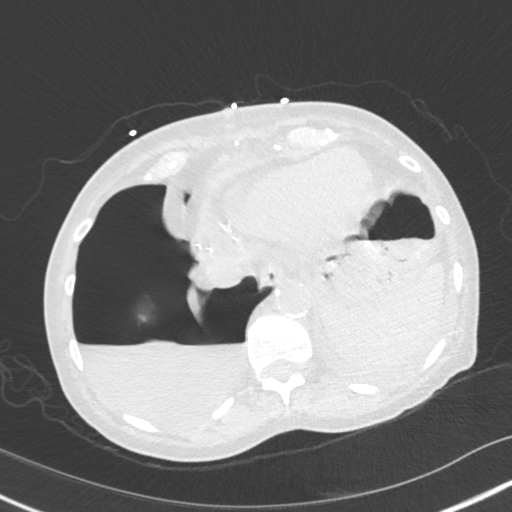
[im 57/160  mediastinal]
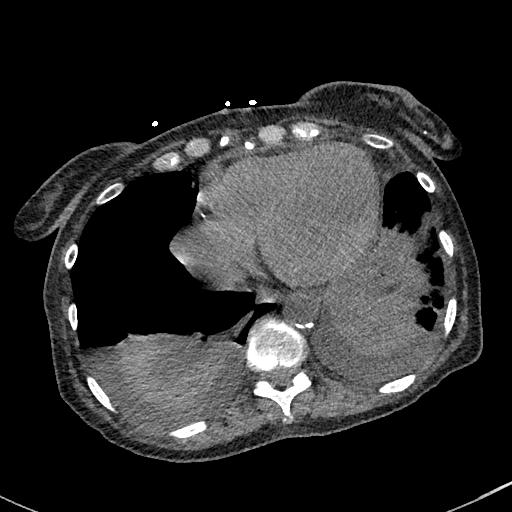
[im 57/160  lung]
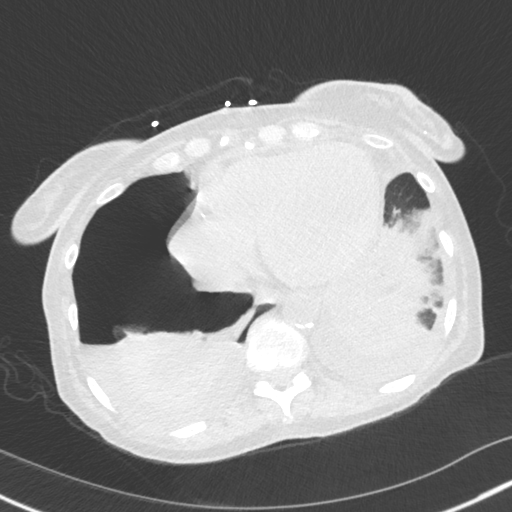
[im 69/160  lung]
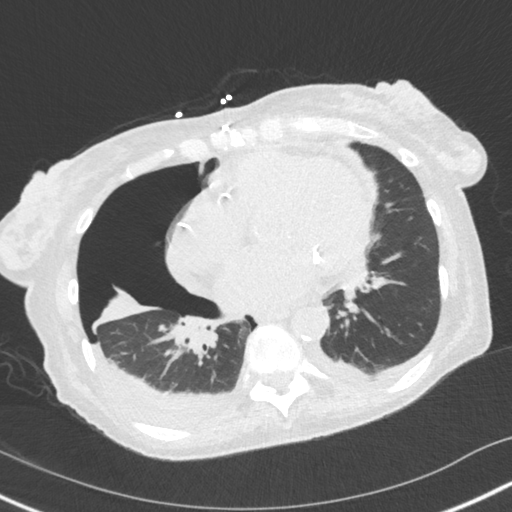
[im 91/160  lung]
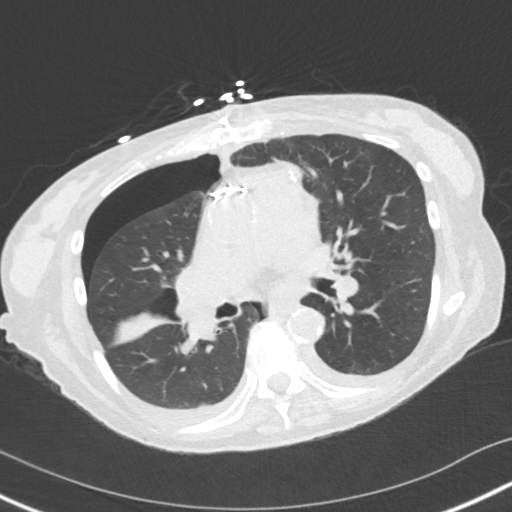
[im 103/160  lung]
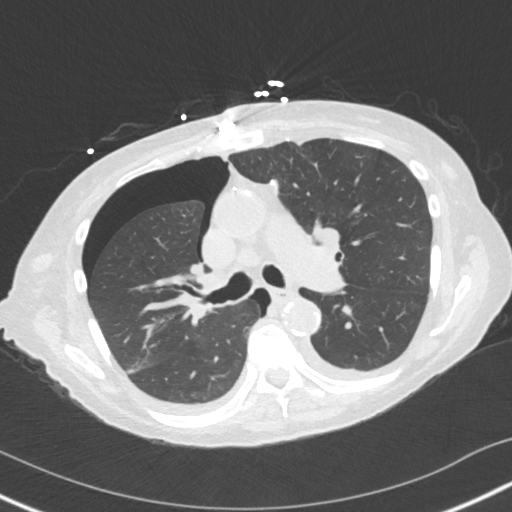
[im 114/160  mediastinal]
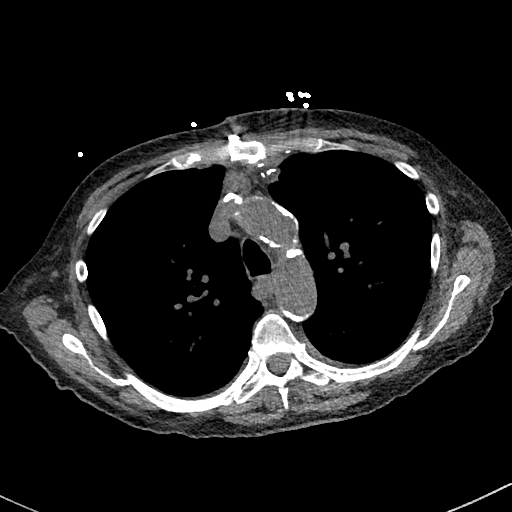
[im 114/160  lung]
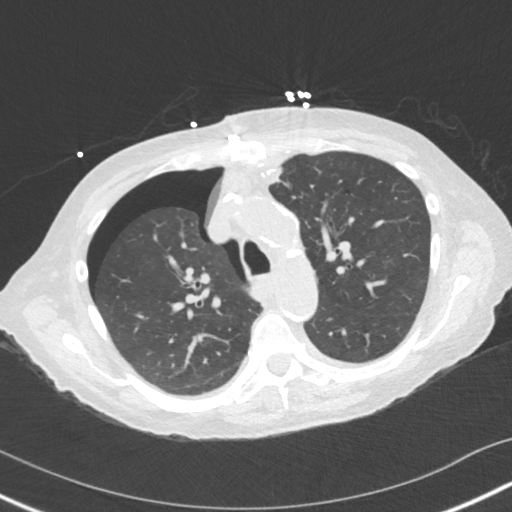
[im 125/160  lung]
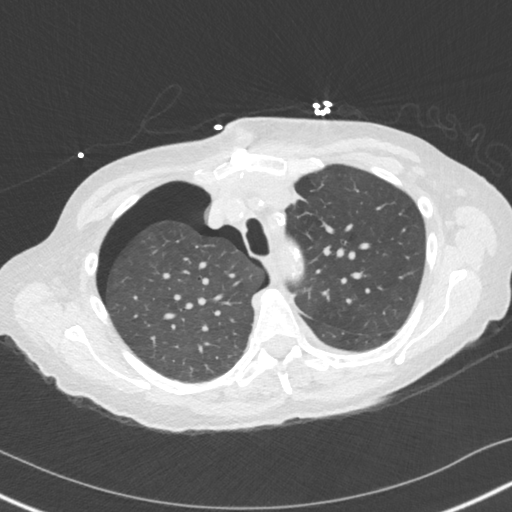
[im 137/160  lung]
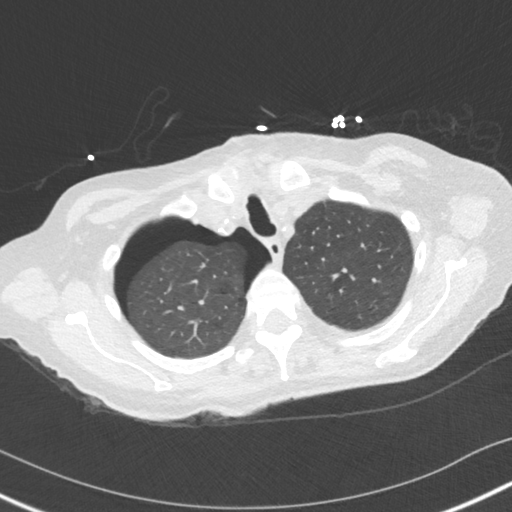
[im 148/160  lung]
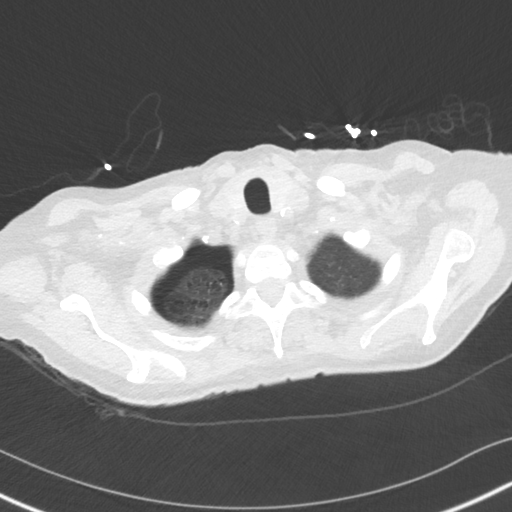

[Series 6: chest w/o 3mm st cor · coronal · non-contrast · 0.63mm/px · 3 of 81 slices shown]
[im 17/81  lung]
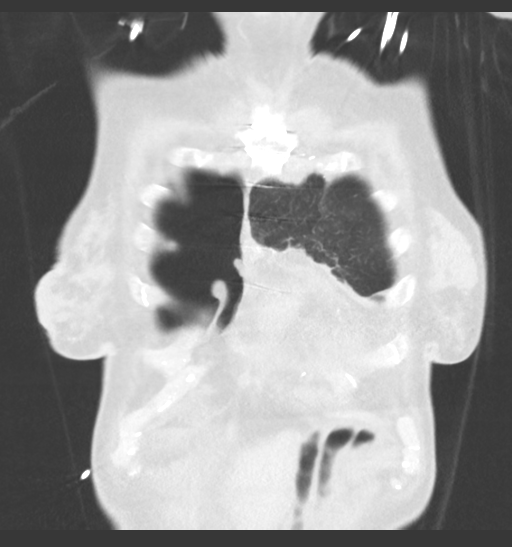
[im 33/81  lung]
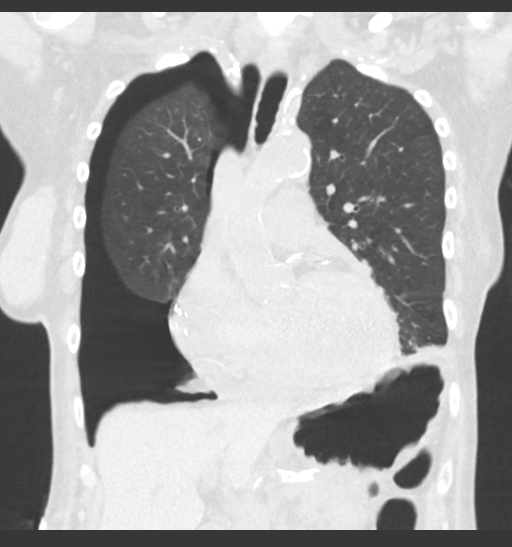
[im 49/81  lung]
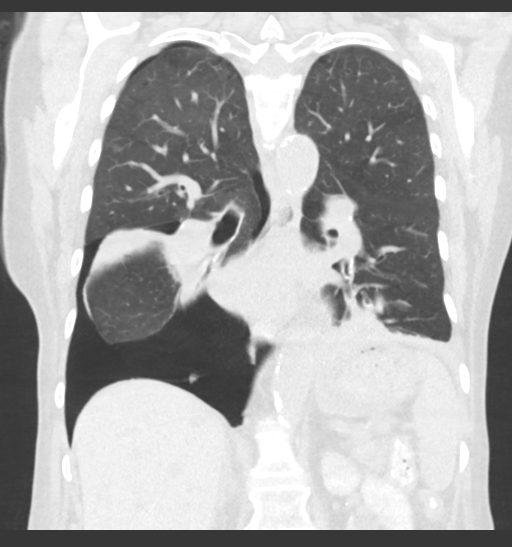

[15 of 36 positions shown; findings below may reference images not displayed]

FINDINGS: Cardiovascular: Stable cardiomegaly with small pericardial effusion
or thickening. Left main and three-vessel dense coronary
arteriosclerosis is noted with aortic atherosclerosis and
atherosclerosis of the proximal great vessels. Patient is status
post CABG. Shallow left lateral aneurysmal bulging of the anterior
aortic arch, measuring 0.5 cm in depth by 1.5 cm in length,
unchanged in appearance.

Mediastinum/Nodes: No esophageal abnormality. No mediastinal shift.
Patent trachea and mainstem bronchi.

Lungs/Pleura: The patient has undergone thoracentesis of moderate to
large right pleural effusion. Residual 1.7 cm in thickness right and
1.4 cm in thickness left small bilateral pleural effusions are noted
with incomplete re-expansion of right middle and lower lobes.
Findings likely represent a pneumothorax ex vacuo at the right lung
base from evacuation of chronic pleural effusion. The ex vacuo
pneumothorax measures 2.4 cm in thickness along the anterior aspect
of the right upper lobe cannot 6.8 cm in thickness on coronal
reformats at the right lung base. Compressive atelectasis is seen at
the left lung base.

Upper Abdomen: No acute abnormality.

Musculoskeletal: No acute nor suspicious osseous abnormalities.
IMPRESSION: 1. Status post thoracentesis of moderate right pleural effusion from
the right lung base.
2. There is resultant incomplete re-expansion of the right middle
and lower lobes. Given this appearance, findings likely represent a
pneumothorax ex vacuo. No mediastinal shift is noted.
3. There are small bilateral pleural effusions as above described
measuring up to 1.7 cm in thickness on the right and 1.4 cm in
thickness on the left, both at the lung bases posteriorly.
4. Cardiomegaly with trace pericardial effusion, stable in
appearance with post CABG change.
5. Moderate aortic atherosclerosis and atherosclerosis of the great
vessels. Slight left lateral aneurysmal bulging of the aortic arch
measuring 1.5 cm in length by 0.5 cm in depth also stable.

Aortic Atherosclerosis ([id number].J).

## 2018-04-19 IMAGING — DX DG CHEST 1V PORT
1 series · 1 of 1 positions shown · non-contrast
Comparison: X-ray and CT dated 06/27/2017.

CLINICAL DATA: 66-year-old female with pneumothorax.

EXAM:
PORTABLE CHEST 1 VIEW

[chest ap]
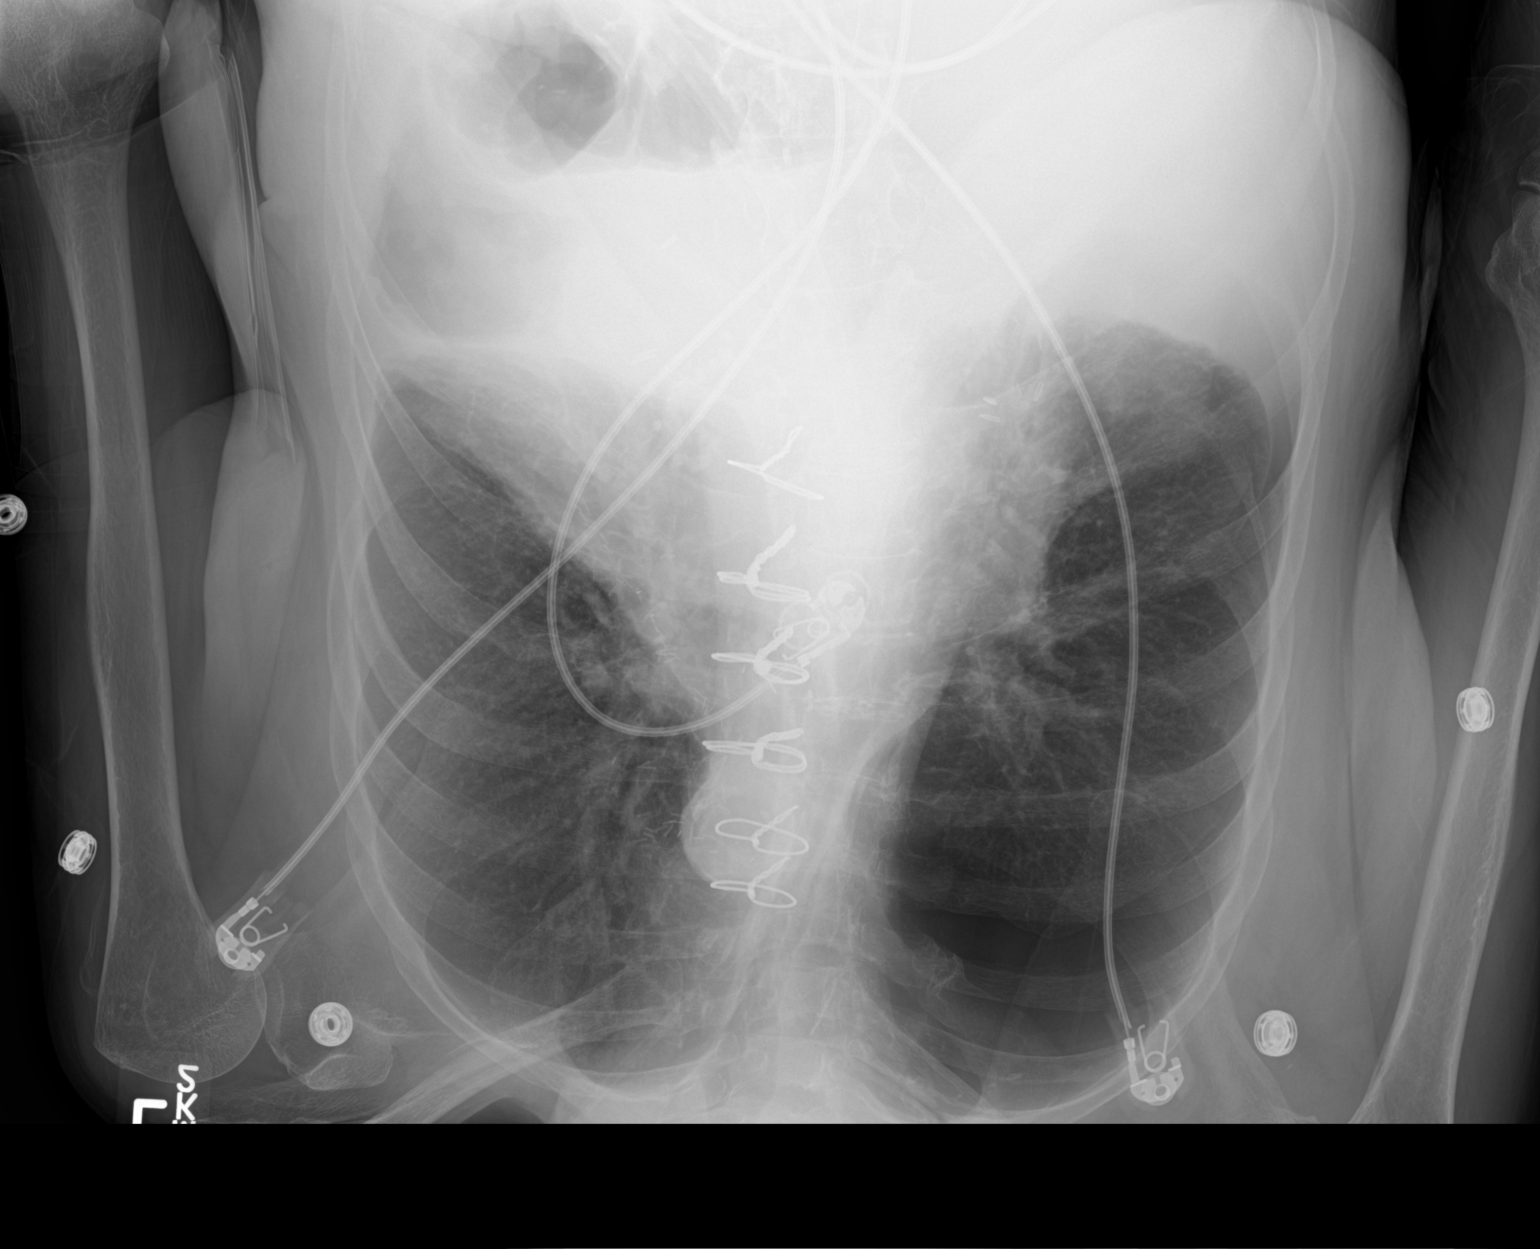

[1 of 1 positions shown; findings below may reference images not displayed]

FINDINGS: Median sternotomy wires again noted. The cardiomediastinal
silhouette is unchanged in size and morphology.

Moderate to large right apical pneumothorax is again noted. There is
an enlarging right basilar pleural effusion small left basilar
effusion and associated atelectasis also identified. Superimposed
consolidation difficult to exclude in this setting. No left-sided
pneumothorax.

No acute osseous abnormalities.
IMPRESSION: 1. Continued moderate to large right apical pneumothorax with
developing right basilar effusion and atelectasis, with or without
superimposed consolidation.
2. Small left basilar effusion and associated atelectasis. Again,
superimposed consolidation difficult to exclude.

## 2018-04-19 IMAGING — DX DG CHEST 1V PORT
1 series · 1 of 1 positions shown · non-contrast
Comparison: Portable chest x-ray of 06/28/2017 earlier in the day

CLINICAL DATA: Pneumothorax, followup

EXAM:
PORTABLE CHEST 1 VIEW

[chest ap]
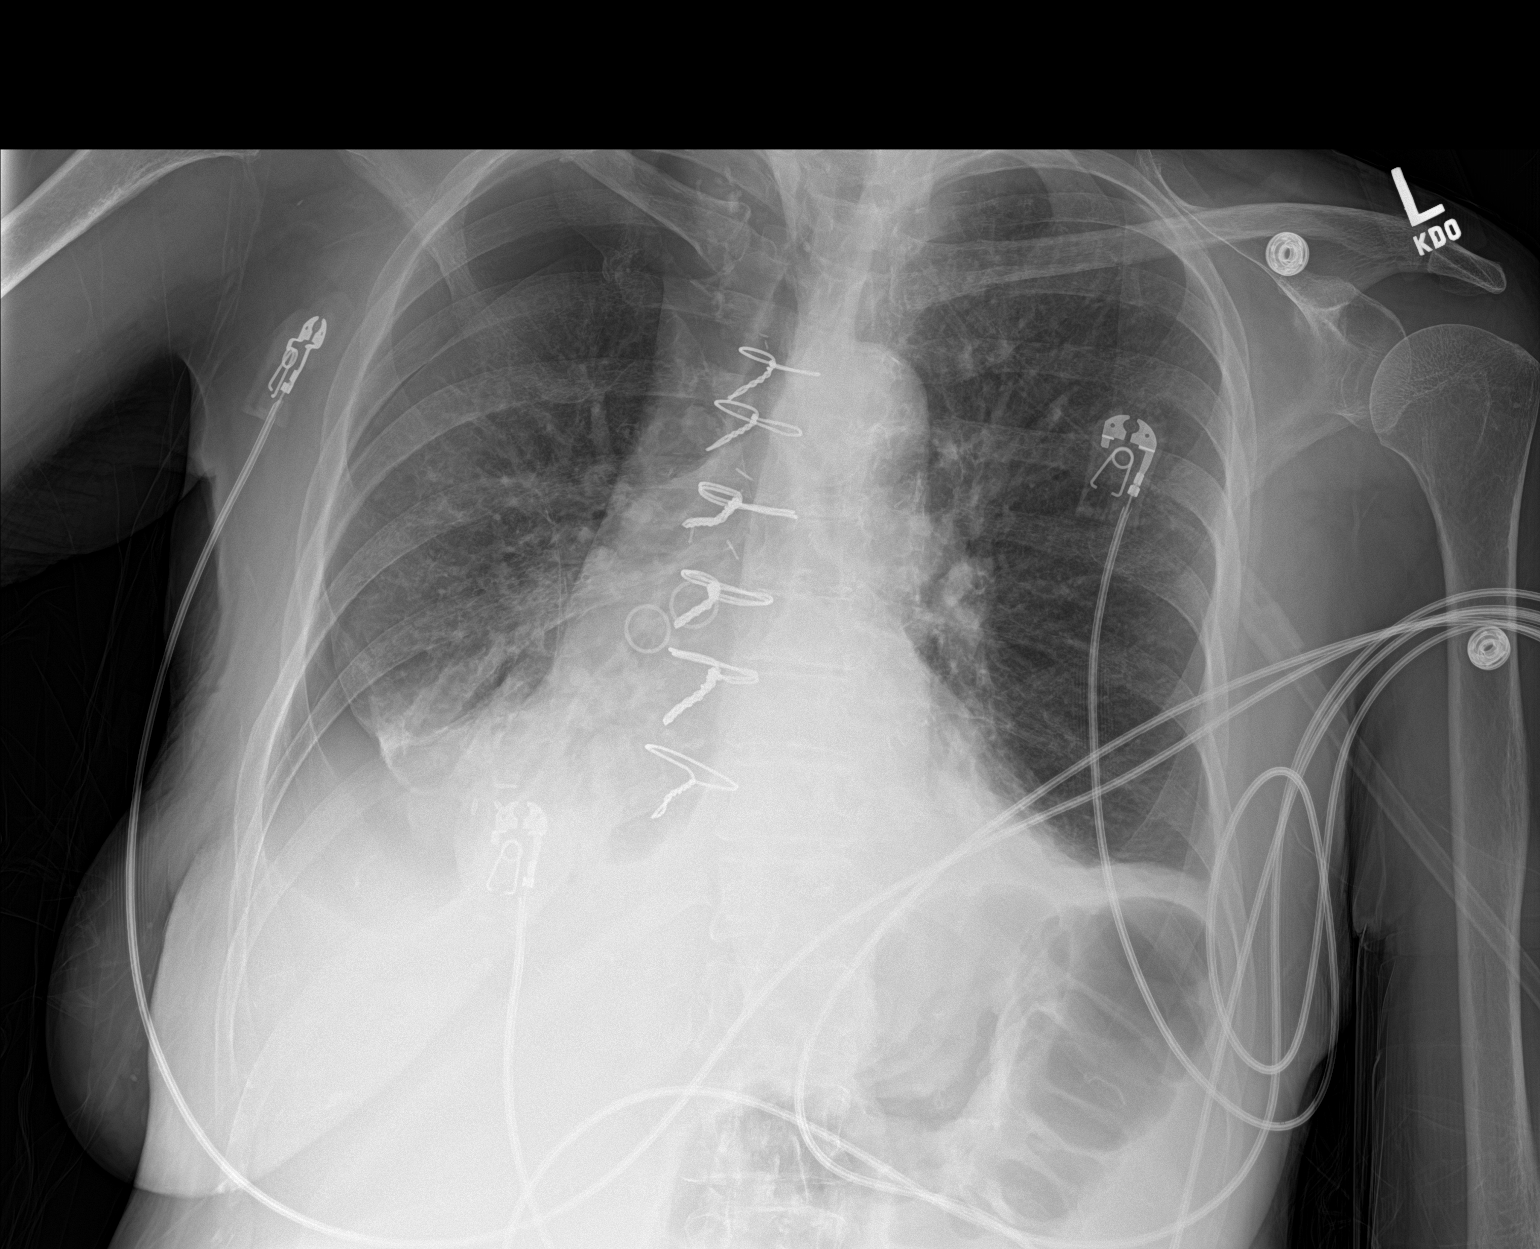

[1 of 1 positions shown; findings below may reference images not displayed]

FINDINGS: There is little change in volume of the right pneumothorax which is
at least 50%. Right pleural effusion again is noted as well. Some
opacity is present medially at the left lung base which could be due
to atelectasis but pneumonia cannot be excluded. Mediastinal and
hilar contours are unremarkable and mild cardiomegaly is stable.
Median sternotomy sutures are noted
IMPRESSION: 1. Little change in approximately 50% right pneumothorax with right
pleural effusion.
2. No change in opacity medially at the left lung base. Atelectasis
or pneumonia.

## 2018-05-03 ENCOUNTER — Telehealth: Payer: Self-pay | Admitting: Cardiovascular Disease

## 2018-05-03 NOTE — Telephone Encounter (Signed)
Pt is deceased. 

## 2019-06-30 NOTE — Telephone Encounter (Signed)
Se this encounter
